# Patient Record
Sex: Male | Born: 1950 | Race: White | Hispanic: No | Marital: Married | Smoking: Never smoker
Health system: Southern US, Community
[De-identification: ages and names within clinical notes are randomized; demographics above are authoritative.]

## PROBLEM LIST (undated history)

## (undated) DIAGNOSIS — R569 Unspecified convulsions: Secondary | ICD-10-CM

## (undated) DIAGNOSIS — I10 Essential (primary) hypertension: Secondary | ICD-10-CM

## (undated) DIAGNOSIS — B009 Herpesviral infection, unspecified: Secondary | ICD-10-CM

## (undated) DIAGNOSIS — H8111 Benign paroxysmal vertigo, right ear: Secondary | ICD-10-CM

## (undated) DIAGNOSIS — D329 Benign neoplasm of meninges, unspecified: Secondary | ICD-10-CM

## (undated) DIAGNOSIS — I480 Paroxysmal atrial fibrillation: Secondary | ICD-10-CM

## (undated) DIAGNOSIS — H269 Unspecified cataract: Secondary | ICD-10-CM

## (undated) DIAGNOSIS — D3001 Benign neoplasm of right kidney: Secondary | ICD-10-CM

## (undated) DIAGNOSIS — N529 Male erectile dysfunction, unspecified: Secondary | ICD-10-CM

## (undated) DIAGNOSIS — F419 Anxiety disorder, unspecified: Secondary | ICD-10-CM

## (undated) DIAGNOSIS — R55 Syncope and collapse: Secondary | ICD-10-CM

## (undated) DIAGNOSIS — K469 Unspecified abdominal hernia without obstruction or gangrene: Secondary | ICD-10-CM

## (undated) DIAGNOSIS — Z87898 Personal history of other specified conditions: Secondary | ICD-10-CM

## (undated) DIAGNOSIS — M069 Rheumatoid arthritis, unspecified: Secondary | ICD-10-CM

## (undated) DIAGNOSIS — I499 Cardiac arrhythmia, unspecified: Secondary | ICD-10-CM

## (undated) HISTORY — PX: FLEXIBLE SIGMOIDOSCOPY: SHX1649

## (undated) HISTORY — DX: Benign neoplasm of right kidney: D30.01

## (undated) HISTORY — PX: REFRACTIVE SURGERY: SHX103

## (undated) HISTORY — DX: Benign paroxysmal vertigo, right ear: H81.11

## (undated) HISTORY — DX: Cardiac arrhythmia, unspecified: I49.9

## (undated) HISTORY — DX: Rheumatoid arthritis, unspecified: M06.9

## (undated) HISTORY — DX: Unspecified abdominal hernia without obstruction or gangrene: K46.9

## (undated) HISTORY — DX: Essential (primary) hypertension: I10

## (undated) HISTORY — DX: Herpesviral infection, unspecified: B00.9

## (undated) HISTORY — DX: Unspecified cataract: H26.9

## (undated) HISTORY — PX: CATARACT EXTRACTION W/ INTRAOCULAR LENS IMPLANT: SHX1309

## (undated) HISTORY — DX: Male erectile dysfunction, unspecified: N52.9

---
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
## 2005-07-31 ENCOUNTER — Ambulatory Visit: Payer: Self-pay | Admitting: Family Medicine

## 2005-08-07 ENCOUNTER — Ambulatory Visit: Payer: Self-pay | Admitting: Family Medicine

## 2006-08-06 ENCOUNTER — Ambulatory Visit: Payer: Self-pay | Admitting: Family Medicine

## 2006-08-13 ENCOUNTER — Ambulatory Visit: Payer: Self-pay | Admitting: Family Medicine

## 2006-08-18 ENCOUNTER — Encounter: Admission: RE | Admit: 2006-08-18 | Discharge: 2006-08-18 | Payer: Self-pay | Admitting: Family Medicine

## 2006-12-15 HISTORY — PX: COLONOSCOPY: SHX174

## 2007-08-24 ENCOUNTER — Ambulatory Visit: Payer: Self-pay | Admitting: Family Medicine

## 2007-08-24 LAB — CONVERTED CEMR LAB
ALT: 26 units/L (ref 0–53)
Albumin: 4 g/dL (ref 3.5–5.2)
Alkaline Phosphatase: 66 units/L (ref 39–117)
BUN: 21 mg/dL (ref 6–23)
Basophils Absolute: 0 10*3/uL (ref 0.0–0.1)
Basophils Relative: 0.1 % (ref 0.0–1.0)
CO2: 29 meq/L (ref 19–32)
Calcium: 9.5 mg/dL (ref 8.4–10.5)
Chloride: 105 meq/L (ref 96–112)
Creatinine, Ser: 1 mg/dL (ref 0.4–1.5)
HDL: 49 mg/dL (ref >39.0)
Hemoglobin: 14.6 g/dL (ref 13.0–17.0)
Ketones, urine, test strip: NEGATIVE
LDL Cholesterol: 115 mg/dL — ABNORMAL HIGH (ref 0–99)
MCHC: 35.1 g/dL (ref 30.0–36.0)
Monocytes Relative: 9.5 % (ref 3.0–11.0)
Nitrite: NEGATIVE
Platelets: 303 10*3/uL (ref 150–400)
Protein, U semiquant: NEGATIVE
RBC: 4.29 M/uL (ref 4.22–5.81)
RDW: 12.5 % (ref 11.5–14.6)
Total Bilirubin: 0.8 mg/dL (ref 0.3–1.2)
Total CHOL/HDL Ratio: 3.6
Total Protein: 6.9 g/dL (ref 6.0–8.3)
Triglycerides: 69 mg/dL (ref 0–149)
Urobilinogen, UA: 0.2
VLDL: 14 mg/dL (ref 0–40)

## 2007-08-26 DIAGNOSIS — J309 Allergic rhinitis, unspecified: Secondary | ICD-10-CM | POA: Insufficient documentation

## 2007-08-26 DIAGNOSIS — I10 Essential (primary) hypertension: Secondary | ICD-10-CM

## 2007-08-31 ENCOUNTER — Ambulatory Visit: Payer: Self-pay | Admitting: Family Medicine

## 2007-08-31 DIAGNOSIS — N529 Male erectile dysfunction, unspecified: Secondary | ICD-10-CM

## 2007-09-17 ENCOUNTER — Ambulatory Visit: Payer: Self-pay | Admitting: Gastroenterology

## 2007-10-06 ENCOUNTER — Ambulatory Visit: Payer: Self-pay | Admitting: Gastroenterology

## 2008-02-24 ENCOUNTER — Ambulatory Visit: Payer: Self-pay | Admitting: Family Medicine

## 2008-07-17 DIAGNOSIS — N410 Acute prostatitis: Secondary | ICD-10-CM | POA: Insufficient documentation

## 2008-07-24 ENCOUNTER — Ambulatory Visit: Payer: Self-pay | Admitting: Family Medicine

## 2008-07-24 LAB — CONVERTED CEMR LAB
Ketones, urine, test strip: NEGATIVE
Nitrite: NEGATIVE
Protein, U semiquant: NEGATIVE

## 2008-08-09 ENCOUNTER — Telehealth: Payer: Self-pay | Admitting: Family Medicine

## 2008-09-25 ENCOUNTER — Ambulatory Visit: Payer: Self-pay | Admitting: Family Medicine

## 2008-09-25 LAB — CONVERTED CEMR LAB
ALT: 29 units/L (ref 0–53)
AST: 23 units/L (ref 0–37)
Alkaline Phosphatase: 66 units/L (ref 39–117)
Basophils Absolute: 0 10*3/uL (ref 0.0–0.1)
Bilirubin Urine: NEGATIVE
Bilirubin, Direct: 0.1 mg/dL (ref 0.0–0.3)
Blood in Urine, dipstick: NEGATIVE
CO2: 29 meq/L (ref 19–32)
Calcium: 9.1 mg/dL (ref 8.4–10.5)
Chloride: 106 meq/L (ref 96–112)
Cholesterol: 168 mg/dL (ref 0–200)
Glucose, Bld: 98 mg/dL (ref 70–99)
Glucose, Urine, Semiquant: NEGATIVE
Hemoglobin: 14.3 g/dL (ref 13.0–17.0)
LDL Cholesterol: 106 mg/dL — ABNORMAL HIGH (ref 0–99)
Lymphocytes Relative: 23.7 % (ref 12.0–46.0)
MCHC: 35 g/dL (ref 30.0–36.0)
Monocytes Relative: 8.9 % (ref 3.0–12.0)
Neutrophils Relative %: 64.9 % (ref 43.0–77.0)
Platelets: 326 10*3/uL (ref 150–400)
Potassium: 4.4 meq/L (ref 3.5–5.1)
Protein, U semiquant: NEGATIVE
RDW: 12.5 % (ref 11.5–14.6)
Sodium: 142 meq/L (ref 135–145)
TSH: 3.73 microintl units/mL (ref 0.35–5.50)
Total Bilirubin: 0.8 mg/dL (ref 0.3–1.2)
Total CHOL/HDL Ratio: 3.9
Total Protein: 7.2 g/dL (ref 6.0–8.3)
Triglycerides: 92 mg/dL (ref 0–149)
Urobilinogen, UA: 0.2
VLDL: 18 mg/dL (ref 0–40)

## 2008-10-09 ENCOUNTER — Ambulatory Visit: Payer: Self-pay | Admitting: Family Medicine

## 2008-10-09 DIAGNOSIS — B369 Superficial mycosis, unspecified: Secondary | ICD-10-CM | POA: Insufficient documentation

## 2009-01-18 ENCOUNTER — Telehealth: Payer: Self-pay | Admitting: Family Medicine

## 2009-10-04 ENCOUNTER — Ambulatory Visit: Payer: Self-pay | Admitting: Family Medicine

## 2009-10-04 LAB — CONVERTED CEMR LAB
Alkaline Phosphatase: 59 units/L (ref 39–117)
Basophils Absolute: 0 10*3/uL (ref 0.0–0.1)
Bilirubin, Direct: 0 mg/dL (ref 0.0–0.3)
Blood in Urine, dipstick: NEGATIVE
CO2: 27 meq/L (ref 19–32)
Calcium: 9.1 mg/dL (ref 8.4–10.5)
Creatinine, Ser: 1 mg/dL (ref 0.4–1.5)
Eosinophils Absolute: 0.1 10*3/uL (ref 0.0–0.7)
GFR calc non Af Amer: 81.27 mL/min (ref >60)
Glucose, Bld: 91 mg/dL (ref 70–99)
HDL: 47.3 mg/dL (ref >39.00)
Ketones, urine, test strip: NEGATIVE
Lymphocytes Relative: 31 % (ref 12.0–46.0)
MCHC: 34 g/dL (ref 30.0–36.0)
Neutrophils Relative %: 55.4 % (ref 43.0–77.0)
Nitrite: NEGATIVE
Protein, U semiquant: NEGATIVE
RDW: 12 % (ref 11.5–14.6)
Specific Gravity, Urine: <1.005
Total Bilirubin: 0.8 mg/dL (ref 0.3–1.2)
Total CHOL/HDL Ratio: 3
Triglycerides: 78 mg/dL (ref 0.0–149.0)
VLDL: 15.6 mg/dL (ref 0.0–40.0)

## 2009-10-18 ENCOUNTER — Ambulatory Visit: Payer: Self-pay | Admitting: Family Medicine

## 2009-10-18 DIAGNOSIS — M25579 Pain in unspecified ankle and joints of unspecified foot: Secondary | ICD-10-CM

## 2009-11-15 ENCOUNTER — Ambulatory Visit: Payer: Self-pay | Admitting: Family Medicine

## 2009-11-15 DIAGNOSIS — M1990 Unspecified osteoarthritis, unspecified site: Secondary | ICD-10-CM

## 2009-11-20 ENCOUNTER — Ambulatory Visit: Payer: Self-pay | Admitting: Family Medicine

## 2009-11-20 LAB — CONVERTED CEMR LAB
Eosinophils Relative: 3.2 % (ref 0.0–5.0)
HCT: 40.6 % (ref 39.0–52.0)
Hemoglobin: 13.7 g/dL (ref 13.0–17.0)
Lymphs Abs: 1.2 10*3/uL (ref 0.7–4.0)
Monocytes Relative: 4.2 % (ref 3.0–12.0)
Neutro Abs: 3.6 10*3/uL (ref 1.4–7.7)
Platelets: 318 10*3/uL (ref 150.0–400.0)
RBC: 4.08 M/uL — ABNORMAL LOW (ref 4.22–5.81)
Rhuematoid fact SerPl-aCnc: 571.9 intl units/mL — ABNORMAL HIGH (ref 0.0–20.0)
WBC: 5.2 10*3/uL (ref 4.5–10.5)

## 2009-11-21 ENCOUNTER — Encounter: Payer: Self-pay | Admitting: Family Medicine

## 2009-11-21 LAB — CONVERTED CEMR LAB: Cyclic Citrullin Peptide Ab: 1389 units — ABNORMAL HIGH (ref <7)

## 2009-11-23 ENCOUNTER — Ambulatory Visit: Payer: Self-pay | Admitting: Family Medicine

## 2009-11-23 DIAGNOSIS — M069 Rheumatoid arthritis, unspecified: Secondary | ICD-10-CM | POA: Insufficient documentation

## 2009-11-26 ENCOUNTER — Ambulatory Visit: Payer: Self-pay | Admitting: Family Medicine

## 2009-12-17 ENCOUNTER — Telehealth: Payer: Self-pay | Admitting: Family Medicine

## 2009-12-18 ENCOUNTER — Telehealth: Payer: Self-pay | Admitting: Family Medicine

## 2009-12-24 ENCOUNTER — Telehealth: Payer: Self-pay | Admitting: Family Medicine

## 2009-12-24 ENCOUNTER — Ambulatory Visit: Payer: Self-pay | Admitting: Family Medicine

## 2009-12-24 LAB — CONVERTED CEMR LAB
ALT: 22 units/L (ref 0–53)
AST: 18 units/L (ref 0–37)
Albumin: 3.7 g/dL (ref 3.5–5.2)
Alkaline Phosphatase: 60 units/L (ref 39–117)
Basophils Relative: 0.1 % (ref 0.0–3.0)
CO2: 30 meq/L (ref 19–32)
Calcium: 9.6 mg/dL (ref 8.4–10.5)
Eosinophils Absolute: 0 10*3/uL (ref 0.0–0.7)
Eosinophils Relative: 0.4 % (ref 0.0–5.0)
Hemoglobin: 14.5 g/dL (ref 13.0–17.0)
Lymphocytes Relative: 13.5 % (ref 12.0–46.0)
MCHC: 32.9 g/dL (ref 30.0–36.0)
Monocytes Relative: 5.9 % (ref 3.0–12.0)
Neutro Abs: 5 10*3/uL (ref 1.4–7.7)
RBC: 4.32 M/uL (ref 4.22–5.81)
Sodium: 140 meq/L (ref 135–145)
Total Protein: 7.5 g/dL (ref 6.0–8.3)

## 2010-02-04 ENCOUNTER — Ambulatory Visit: Payer: Self-pay | Admitting: Family Medicine

## 2010-04-02 ENCOUNTER — Telehealth: Payer: Self-pay | Admitting: Family Medicine

## 2010-04-02 ENCOUNTER — Ambulatory Visit: Payer: Self-pay | Admitting: Family Medicine

## 2010-04-03 LAB — CONVERTED CEMR LAB
ALT: 20 units/L (ref 0–53)
Alkaline Phosphatase: 53 units/L (ref 39–117)
Basophils Relative: 0.8 % (ref 0.0–3.0)
Bilirubin, Direct: 0 mg/dL (ref 0.0–0.3)
Calcium: 9.6 mg/dL (ref 8.4–10.5)
Creatinine, Ser: 1 mg/dL (ref 0.4–1.5)
Eosinophils Absolute: 0 10*3/uL (ref 0.0–0.7)
Eosinophils Relative: 0.1 % (ref 0.0–5.0)
Lymphocytes Relative: 13.9 % (ref 12.0–46.0)
Neutrophils Relative %: 82.1 % — ABNORMAL HIGH (ref 43.0–77.0)
RBC: 4.14 M/uL — ABNORMAL LOW (ref 4.22–5.81)
Total Bilirubin: 0.8 mg/dL (ref 0.3–1.2)
Total Protein: 7.6 g/dL (ref 6.0–8.3)
WBC: 5.9 10*3/uL (ref 4.5–10.5)

## 2010-06-07 ENCOUNTER — Ambulatory Visit: Payer: Self-pay | Admitting: Family Medicine

## 2010-06-28 ENCOUNTER — Ambulatory Visit: Payer: Self-pay | Admitting: Family Medicine

## 2010-06-28 LAB — CONVERTED CEMR LAB
ALT: 18 units/L (ref 0–53)
Albumin: 4.3 g/dL (ref 3.5–5.2)
BUN: 21 mg/dL (ref 6–23)
Basophils Relative: 1.5 % (ref 0.0–3.0)
Chloride: 110 meq/L (ref 96–112)
Cholesterol: 182 mg/dL (ref 0–200)
Creatinine, Ser: 1 mg/dL (ref 0.4–1.5)
Eosinophils Relative: 1.3 % (ref 0.0–5.0)
Glucose, Bld: 95 mg/dL (ref 70–99)
Lymphocytes Relative: 31.1 % (ref 12.0–46.0)
MCV: 102.2 fL — ABNORMAL HIGH (ref 78.0–100.0)
Monocytes Absolute: 0.4 10*3/uL (ref 0.1–1.0)
Monocytes Relative: 8.1 % (ref 3.0–12.0)
Neutrophils Relative %: 58 % (ref 43.0–77.0)
Platelets: 352 10*3/uL (ref 150.0–400.0)
RBC: 4.12 M/uL — ABNORMAL LOW (ref 4.22–5.81)
Total Bilirubin: 0.7 mg/dL (ref 0.3–1.2)
Triglycerides: 51 mg/dL (ref 0.0–149.0)
WBC: 5.1 10*3/uL (ref 4.5–10.5)

## 2010-10-11 ENCOUNTER — Ambulatory Visit: Payer: Self-pay | Admitting: Family Medicine

## 2010-10-11 LAB — CONVERTED CEMR LAB
AST: 22 units/L (ref 0–37)
BUN: 22 mg/dL (ref 6–23)
Basophils Absolute: 0 10*3/uL (ref 0.0–0.1)
Bilirubin Urine: NEGATIVE
Blood in Urine, dipstick: NEGATIVE
Cholesterol: 194 mg/dL (ref 0–200)
Eosinophils Absolute: 0.1 10*3/uL (ref 0.0–0.7)
GFR calc non Af Amer: 90.3 mL/min (ref >60)
Glucose, Bld: 89 mg/dL (ref 70–99)
Glucose, Urine, Semiquant: NEGATIVE
HCT: 42.2 % (ref 39.0–52.0)
HDL: 51.9 mg/dL (ref >39.00)
Ketones, urine, test strip: NEGATIVE
Lymphs Abs: 1.7 10*3/uL (ref 0.7–4.0)
Monocytes Absolute: 0.5 10*3/uL (ref 0.1–1.0)
Monocytes Relative: 10.5 % (ref 3.0–12.0)
PSA: 0.79 ng/mL (ref 0.10–4.00)
Platelets: 348 10*3/uL (ref 150.0–400.0)
Potassium: 4.6 meq/L (ref 3.5–5.1)
Protein, U semiquant: NEGATIVE
RDW: 13.5 % (ref 11.5–14.6)
TSH: 3.1 microintl units/mL (ref 0.35–5.50)
Total Bilirubin: 1.1 mg/dL (ref 0.3–1.2)
VLDL: 9 mg/dL (ref 0.0–40.0)
pH: 5

## 2010-10-22 ENCOUNTER — Ambulatory Visit: Payer: Self-pay | Admitting: Family Medicine

## 2010-12-15 HISTORY — PX: BRAIN MENINGIOMA EXCISION: SHX576

## 2011-01-12 LAB — CONVERTED CEMR LAB: Uric Acid, Serum: 5.5 mg/dL (ref 4.0–7.8)

## 2011-01-15 ENCOUNTER — Telehealth: Payer: Self-pay | Admitting: Family Medicine

## 2011-01-15 NOTE — Telephone Encounter (Signed)
PTS WIFE CALLED TO ADV THAT HER HUSBAND WILL BE COMING IN FOR CPX IN 3 WKS AND PER DR TODD SHE WAS SUPPOSED TO CALL TO MAKE SURE THAT A SHINGLES VACCINE / SHOT WILL BE AVAILABLE FOR HER HUSBAND TO HAVE AT THAT TIME.

## 2011-01-16 NOTE — Progress Notes (Signed)
Summary: RA symptoms  Phone Note Outgoing Call   Caller: Spouse Call For: Roderick Pee MD Summary of Call: Pt's wife called to let Dr. Tawanna Cooler know he took his last Prednisone yesterday, and is having a return of pain and swelling from RA. [phone] [employer] ([place]) Initial call taken by: Lynann Beaver CMA,  December 17, 2009 9:08 AM Summary of Call: [name] tapered off the prednisone by taking 10 mg Monday, Wednesday, Friday, and now having joint pain.  Again.  Advised to take 10 mg daily x 7 days, then 10 mg Monday, Wednesday, Friday, x 2 weeks, then 5 mg Monday, Wednesday, Friday, x 2 weeks, then discuss further tapering Initial call taken by: Roderick Pee MD,  December 17, 2009 9:44 AM

## 2011-01-16 NOTE — Telephone Encounter (Signed)
We have plenty of shingles vaccines available. Please make sure he has checked with his insurance company for coverage.

## 2011-01-16 NOTE — Progress Notes (Signed)
Summary: wrong dosage  Phone Note Call from Patient   Caller: Patient Call For: Roderick Pee MD Summary of Call: Pt had been taking Methotrexate 2.5 mg. x 3 times and just realized he was to take 5 mg.   He took 5 mg. on Sunday for the first time, and wonders if he needs to reschedule his labs and appt until he is taking the correct dosage. Labs 12/24/2009 [phone] Initial call taken by: Lynann Beaver CMA,  December 18, 2009 2:49 PM  Follow-up for Phone Call        n0 Follow-up by: Roderick Pee MD,  December 18, 2009 5:29 PM  Additional Follow-up for Phone Call Additional follow up Details #1::        Pt. notified. Additional Follow-up by: Lynann Beaver CMA,  December 19, 2009 8:19 AM

## 2011-01-16 NOTE — Assessment & Plan Note (Signed)
Summary: 1 month follow up/cjr   Vital Signs:  Patient profile:   60 year old male Weight:      187 pounds Temp:     98.4 degrees F oral BP sitting:   120 / 78  (left arm) Cuff size:   regular  Vitals Entered By: Kern Reap CMA Duncan Dull) (December 24, 2009 9:45 AM)  Reason for Visit 1 month follow up  History of Present Illness: Max [name] is a 60 year old, married male, nonsmoker, who comes in today for evaluation of rheumatoid arthritis.  He was found to have rheumatoid arthritis and started on a combination of prednisone, which he is now down to 10 mg daily.  He's also on methotrexate 2.5 mg two tabs weekly.  He states he is about 90% improved.  He has some morning stiffness in his hands, which tends to go away by 10 a.m..  No side effects from medication.  Allergies: No Known Drug Allergies  Past History:  Past medical, surgical, family and social histories (including risk factors) reviewed for relevance to current acute and chronic problems.  Past Medical History: Reviewed history from 11/23/2009 and no changes required. RLS Allergic rhinitis Hypertension Herpes ED Rheumatoid arthritis  Past Surgical History: Reviewed history from 08/26/2007 and no changes required. Cataract extraction Flexible Sigmoidoscopy  Family History: Reviewed history from 08/26/2007 and no changes required. Family History of Sepsis Family History Depression Family History Hypertension Family History Psychiatric care Family History Weight disorder Family History of Digestive disorder Family History of Respiratory disease  Social History: Reviewed history from 08/31/2007 and no changes required. Occupation: Restaurant manager, fast food glass Married Never Smoked Alcohol use-yes Regular exercise-yes  Review of Systems      See HPI  Physical Exam  General:  Well-developed,well-nourished,in no acute distress; alert,appropriate and cooperative throughout examination Msk:  No deformity or scoliosis  noted of thoracic or lumbar spine.   Pulses:  R and L carotid,radial,femoral,dorsalis pedis and posterior tibial pulses are full and equal bilaterally Extremities:  No clubbing, cyanosis, edema, or deformity noted with normal full range of motion of all joints.     Impression & Recommendations:  Problem # 1:  RHEUMATOID ARTHRITIS (ICD-714.0) Assessment Improved  Orders: Venipuncture ([address]) Prescription Created Electronically [phone]) TLB-BMP (Basic Metabolic Panel-BMET) (80048-METABOL) TLB-CBC Platelet - w/Differential (85025-CBCD) TLB-Hepatic/Liver Function Pnl (80076-HEPATIC)  Complete Medication List: 1)  Verapamil Hcl 120 Mg Tabs (Verapamil hcl) .... Take 1 tablet by mouth every morning 2)  Adult Aspirin Low Strength 81 Mg Tbdp (Aspirin) 3)  Viagra 100 Mg Tabs (Sildenafil citrate) .... Uad 4)  Acyclovir 800 Mg Tabs (Acyclovir) .... Take one tab two times a day 5)  Motrin Ib 200 Mg Tabs (Ibuprofen) .... Take 1 tablet by mouth two times a day as needed foot pain 6)  Calan Sr 240 Mg Cr-tabs (Verapamil hcl) .... Take one tab by mouth once daily 7)  Prednisone 20 Mg Tabs (Prednisone) .... Uad 8)  Trexall 5 Mg Tabs (Methotrexate sodium) .... One weekly  Patient Instructions: 1)  continue your current medications.  I will call you when I get your lab work back 2)  Please schedule a follow-up appointment in 2 months. 3)  remember to take Prilosec 20 mg daily, and a multivitamin with folic acid daily

## 2011-01-16 NOTE — Progress Notes (Signed)
  Phone Note Outgoing Call   Summary of Call: inc. MTX to 10 md wkly.pred. 10 mg once daily.r/t 6 weeks Initial call taken by: Roderick Pee MD,  December 24, 2009 6:38 PM

## 2011-01-16 NOTE — Assessment & Plan Note (Signed)
Summary: 2 MTH ROV // RS   Vital Signs:  Patient profile:   60 year old male Weight:      186 pounds Temp:     98.4 degrees F oral BP sitting:   140 / 78  (left arm) Cuff size:   regular  Vitals Entered By: Kern Reap CMA Duncan Dull) (June 07, 2010 8:52 AM) CC: follow-up visit   CC:  follow-up visit.  History of Present Illness: Max [name] is a 60 year old male, who comes in today for evaluation of pain in his right foot.  He has underlying rheumatoid arthritis.  I referred him to Dorna Leitz because of pain in his right foot.  They diagnosed a leg problem.  However, Max [name] does not, think it's like problem.  He still thinks her something wrong with his foot.  Allergies: No Known Drug Allergies  Physical Exam  General:  Well-developed,well-nourished,in no acute distress; alert,appropriate and cooperative throughout examination Msk:  tenderness at the base of the third MP joint   Impression & Recommendations:  Problem # 1:  PAIN IN JOINT, ANKLE AND FOOT (ICD-719.47) Assessment Unchanged  Complete Medication List: 1)  Verapamil Hcl 120 Mg Tabs (Verapamil hcl) .... Take 1 tablet by mouth every morning 2)  Adult Aspirin Low Strength 81 Mg Tbdp (Aspirin) 3)  Viagra 100 Mg Tabs (Sildenafil citrate) .... Uad 4)  Acyclovir 800 Mg Tabs (Acyclovir) .... Take one tab two times a day 5)  Motrin Ib 200 Mg Tabs (Ibuprofen) .... Take 1 tablet by mouth two times a day as needed foot pain 6)  Calan Sr 240 Mg Cr-tabs (Verapamil hcl) .... Take one tab by mouth once daily 7)  Trexall 5 Mg Tabs (Methotrexate sodium) .... 2 by mouth weekly 8)  Prednisone 1 Mg Tabs (Prednisone) .... Uad  Patient Instructions: 1)  call Dr. Marchelle Gearing.,,,,,,,,, podiatrist........... for a second opinion concerning her foot.  Call the third week in July  to get set up for your routine lab work

## 2011-01-16 NOTE — Progress Notes (Signed)
Summary: motrin  Phone Note Call from Patient   Caller: patsy [phone] Summary of Call: With the prednisone 5mg  day, can he now take Motrin one 200mg  two times a day? Dr. Victorino Dike not at [facility].  Told her [facility].   Initial call taken by: Rudy Jew, RN,  April 02, 2010 1:11 PM  Follow-up for Phone Call        ok.............Marland KitchenDr. Victorino Dike is with [facility]!!!!!!!!!!! Follow-up by: Roderick Pee MD,  April 02, 2010 1:19 PM  Additional Follow-up for Phone Call Additional follow up Details #1::        He has appt with Dr. Victorino Dike next Wed, but he is at [facility] per [facility].   Additional Follow-up by: Rudy Jew, RN,  April 02, 2010 1:25 PM

## 2011-01-16 NOTE — Assessment & Plan Note (Signed)
Summary: 61 month fup//ccm   Vital Signs:  Patient profile:   60 year old male Weight:      186 pounds Temp:     98.3 degrees F oral BP sitting:   130 / 84  (left arm) Cuff size:   regular  Vitals Entered By: Kern Reap CMA Duncan Dull) (April 02, 2010 11:51 AM) CC: follow-up visit   CC:  follow-up visit.  History of Present Illness: Max [name] is a 60 year old, married male, nonsmoker, who comes in today for reevaluation of rheumatoid arthritis.  He is currently on methotrexate 10 mg weekly, and prednisone taper.  Starting tomorrow to be down to 5 mg per day.  We will do now taper by 10 mg every month.  He says overall he feels well.  No stiffness.  No redness.  No soreness in his hands.  He does have some pain in his feet.  He thought it might go away when the arthritis quieted down, but he still having some focal pain in his right foot.  Allergies: No Known Drug Allergies  Past History:  Past medical, surgical, family and social histories (including risk factors) reviewed for relevance to current acute and chronic problems.  Past Medical History: Reviewed history from 11/23/2009 and no changes required. RLS Allergic rhinitis Hypertension Herpes ED Rheumatoid arthritis  Past Surgical History: Reviewed history from 08/26/2007 and no changes required. Cataract extraction Flexible Sigmoidoscopy  Family History: Reviewed history from 08/26/2007 and no changes required. Family History of Sepsis Family History Depression Family History Hypertension Family History Psychiatric care Family History Weight disorder Family History of Digestive disorder Family History of Respiratory disease  Social History: Reviewed history from 08/31/2007 and no changes required. 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 Pulses:  R and L carotid,radial,femoral,dorsalis pedis and posterior tibial pulses are full and equal bilaterally Extremities:  No clubbing, cyanosis, edema, or deformity noted with normal full range of motion of all joints.     Impression & Recommendations:  Problem # 1:  RHEUMATOID ARTHRITIS (ICD-714.0) Assessment Improved  Orders: Venipuncture (82956) Prescription Created Electronically [phone]) TLB-BMP (Basic Metabolic Panel-BMET) (80048-METABOL) TLB-CBC Platelet - w/Differential (85025-CBCD) TLB-Hepatic/Liver Function Pnl (80076-HEPATIC)  Complete Medication List: 1)  Verapamil Hcl 120 Mg Tabs (Verapamil hcl) .... Take 1 tablet by mouth every morning 2)  Adult Aspirin Low Strength 81 Mg Tbdp (Aspirin) 3)  Viagra 100 Mg Tabs (Sildenafil citrate) .... Uad 4)  Acyclovir 800 Mg Tabs (Acyclovir) .... Take one tab two times a day 5)  Motrin Ib 200 Mg Tabs (Ibuprofen) .... Take 1 tablet by mouth two times a day as needed foot pain 6)  Calan Sr 240 Mg Cr-tabs (Verapamil hcl) .... Take one tab by mouth once daily 7)  Prednisone 20 Mg Tabs (Prednisone) .... Uad 8)  Trexall 5 Mg Tabs (Methotrexate sodium) .... 2 by mouth weekly 9)  Prednisone 10 Mg Tabs (Prednisone) .... Uad 10)  Prednisone 1 Mg Tabs (Prednisone) .... Uad  Patient Instructions: 1)  continue the 10 mg of methotrexate weekly.  Take 5 mg of prednisone daily for 3 weeks, 4 mg daily for 3 weeks, 3 mg daily for 3 weeks.  See me in two months for follow-up.Marland Kitchen  I will call you your  Labwork report. 2)  I would also recommend a foot consult with Dr. Toni Arthurs

## 2011-01-16 NOTE — Assessment & Plan Note (Signed)
Summary: 6 WK ROV // RS   Vital Signs:  Patient profile:   60 year old male Weight:      193 pounds Temp:     99.1 degrees F oral BP sitting:   138 / 80  (left arm) Cuff size:   regular  Vitals Entered By: Kern Reap CMA Duncan Dull) (February 04, 2010 8:56 AM)  Reason for Visit follow up office visit  History of Present Illness: Max [name] is a 60 year old, married male, nonsmoker, who comes in today for follow-up of rheumatoid arthritis.  He is currently on methotrexate 10 mg daily and prednisone 10 mg daily.  He is asymptomatic except for little soreness.  No swelling or redness in the ball of his right foot in the morning.  Review of systems negative  Allergies: No Known Drug Allergies  Past History:  Past medical, surgical, family and social histories (including risk factors) reviewed for relevance to current acute and chronic problems.  Past Medical History: Reviewed history from 11/23/2009 and no changes required. RLS Allergic rhinitis Hypertension Herpes ED Rheumatoid arthritis  Past Surgical History: Reviewed history from 08/26/2007 and no changes required. Cataract extraction Flexible Sigmoidoscopy  Family History: Reviewed history from 08/26/2007 and no changes required. Family History of Sepsis Family History Depression Family History Hypertension Family History Psychiatric care Family History Weight disorder Family History of Digestive disorder Family History of Respiratory disease  Social History: Reviewed history from 08/31/2007 and no changes required. 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 Take 1 tablet by mouth every morning 2)  Adult Aspirin Low Strength 81 Mg Tbdp (Aspirin) 3)  Viagra 100 Mg Tabs (Sildenafil citrate) .... Uad 4)  Acyclovir 800 Mg Tabs (Acyclovir) .... Take one tab two times a day 5)  Motrin Ib 200 Mg Tabs (Ibuprofen) .... Take 1 tablet by mouth two times a day as needed foot pain 6)  Calan Sr 240 Mg Cr-tabs (Verapamil hcl) .... Take one tab by mouth once daily 7)  Prednisone 20 Mg Tabs (Prednisone) .... Uad 8)  Trexall 5 Mg Tabs (Methotrexate sodium) .... 2 by mouth weekly 9)  Prednisone 10 Mg Tabs (Prednisone) .... Uad 10)  Prednisone 1 Mg Tabs (Prednisone) .... Uad  Patient Instructions: 1)  continue the methotrexate at 10 mg weekly. 2)  Begin to taper the prednisone by 1 mg every two weeks. 3)  Return in two months for follow-up, sooner if any problems Prescriptions: PREDNISONE 1 MG TABS (PREDNISONE) UAD  #100 x 2   Entered and Authorized by:   Roderick Pee MD   Signed by:   Roderick Pee MD on 02/04/2010   Method used:   Electronically to        [facility] # [address]* (retail)       [address]       [place], [place]  [address]       Ph: [phone]       Fax: [phone]   RxID:   [id number] PREDNISONE 10 MG TABS (PREDNISONE) UAD  #100 x 2   Entered and Authorized by:   Roderick Pee  MD   Signed by:   Roderick Pee MD on 02/04/2010   Method used:   Electronically to        [facility] # [address]* (retail)       [address]       [place], [place]  [address]       Ph: [phone]       Fax: [phone]   RxID:   [id number]

## 2011-01-16 NOTE — Assessment & Plan Note (Signed)
Summary: cpx/cjr   Vital Signs:  Patient profile:   60 year old male Height:      71.5 inches Weight:      189 pounds BMI:     26.09 Temp:     98.1 degrees F oral BP sitting:   138 / 70  (left arm) Cuff size:   regular  Vitals Entered By: Kern Reap CMA Duncan Dull) (October 22, 2010 8:22 AM) CC: cpx   CC:  cpx.  History of Present Illness: Max [name] is a 60 year old, married man, nonsmoker, who comes in today for a new physical examination because of a history of underlying rheumatoid arthritis.  He takes 81 mg a baby aspirin daily and methotrexate 10 mg weekly.  He is been asymptomatic.  He wants to know if he can stop his methotrexate.  He also uses Viagra 100 mg p.r.n.,acyclovir 800 mg b.i.d., verapamil, 120 mg daily for hypertension, 200 mg of Motrin b.i.d. for foot pain......... is currently seeing the podiatrist.  He gets routine eye care, dental care, colonoscopy, and GI, tetanus, 2010, seasonal flu 2010 booster today, information given on shingles  Allergies (verified): No Known Drug Allergies  Past History:  Past medical, surgical, family and social histories (including risk factors) reviewed, and no changes noted (except as noted below).  Past Medical History: Reviewed history from 11/23/2009 and no changes required. RLS Allergic rhinitis Hypertension Herpes ED Rheumatoid arthritis  Past Surgical History: Reviewed history from 08/26/2007 and no changes required. Cataract extraction Flexible Sigmoidoscopy  Family History: Reviewed history from 08/26/2007 and no changes required. Family History of Sepsis Family History Depression Family History Hypertension Family History Psychiatric care Family History Weight disorder Family History of Digestive disorder Family History of Respiratory disease  Social History: Reviewed history from 08/31/2007 and no changes required. 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 no    Any prior history of allergic reactions to egg and/or gelatin? no    Do you have a sensitivity to the preservative Thimersol? no    Do you have a past history of Guillan-Barre Syndrome? no    Do you currently have an acute febrile illness? no    Have you ever had a severe reaction to latex? no    Vaccine information given and explained to patient? yes    Are you currently pregnant? no    Lot Number:[id number]   Exp Date:06/14/2011   Site Given  Right  Deltoid IM   Physical Exam  General:  Well-developed,well-nourished,in no acute distress; alert,appropriate and cooperative throughout examination Head:  Normocephalic and atraumatic without obvious abnormalities. No apparent alopecia or balding. Eyes:  No corneal or conjunctival inflammation noted. EOMI. Perrla. Funduscopic exam benign, without hemorrhages, exudates or papilledema. Vision grossly normal. Ears:  External ear exam shows no significant lesions or deformities.  Otoscopic examination reveals clear canals, tympanic membranes are intact bilaterally without bulging, retraction, inflammation or discharge. Hearing is grossly normal bilaterally. Nose:  External nasal examination shows no deformity or inflammation. Nasal mucosa are pink and moist without lesions or exudates. Mouth:  Oral mucosa and oropharynx without lesions or exudates.  Teeth in good repair. Neck:  No deformities, masses, or tenderness noted. Chest Wall:  No deformities, masses, tenderness or gynecomastia noted. Breasts:  No  masses or gynecomastia noted Lungs:  Normal respiratory effort, chest expands symmetrically. Lungs are clear to auscultation, no crackles or wheezes. Heart:  Normal rate and regular rhythm. S1 and S2 normal without gallop, murmur, click, rub or other extra sounds. Abdomen:  Bowel sounds  positive,abdomen soft and non-tender without masses, organomegaly or hernias noted. Rectal:  No external abnormalities noted. Normal sphincter tone. No rectal masses or tenderness. Genitalia:  Testes bilaterally descended without nodularity, tenderness or masses. No scrotal masses or lesions. No penis lesions or urethral discharge. Prostate:  Prostate gland firm and smooth, no enlargement, nodularity, tenderness, mass, asymmetry or induration. Msk:  No deformity or scoliosis noted of thoracic or lumbar spine.   Pulses:  R and L carotid,radial,femoral,dorsalis pedis and posterior tibial pulses are full and equal bilaterally Extremities:  No clubbing, cyanosis, edema, or deformity noted with normal full range of motion of all joints.   Neurologic:  No cranial nerve deficits noted. Station and gait are normal. Plantar reflexes are down-going bilaterally. DTRs are symmetrical throughout. Sensory, motor and coordinative functions appear intact. Skin:  Intact without suspicious lesions or rashes Cervical Nodes:  No lymphadenopathy noted Axillary Nodes:  No palpable lymphadenopathy Inguinal Nodes:  No significant adenopathy Psych:  Cognition and judgment appear intact. Alert and cooperative with normal attention span and concentration. No apparent delusions, illusions, hallucinations   Impression & Recommendations:  Problem # 1:  RHEUMATOID ARTHRITIS (ICD-714.0) Assessment Improved  Problem # 2:  ERECTILE DYSFUNCTION, ORGANIC (ICD-607.84) Assessment: Improved  His updated medication list for this problem includes:    Viagra 100 Mg Tabs (Sildenafil citrate) ..... Uad  Problem # 3:  HYPERTENSION (ICD-401.9) Assessment: Improved  The following medications were removed from the medication list:    Calan Sr 240 Mg Cr-tabs (Verapamil hcl) .Marland Kitchen... Take one tab by mouth once daily His updated medication list for this problem includes:    Verapamil Hcl 120 Mg Tabs (Verapamil hcl) .Marland Kitchen... Take 1 tablet by  mouth every morning  Orders: EKG w/ Interpretation (93000)  Problem # 4:  and  Complete Medication List: 1)  Verapamil Hcl 120 Mg Tabs (Verapamil hcl) .... Take 1 tablet by mouth every morning 2)  Adult Aspirin Low Strength 81 Mg Tbdp (Aspirin) 3)  Viagra 100 Mg Tabs (Sildenafil citrate) .... Uad 4)  Acyclovir 800 Mg Tabs (Acyclovir) .... Take one tab two times a day 5)  Motrin Ib 200 Mg Tabs (Ibuprofen) .... Take 1 tablet by mouth two times a day as needed foot pain 6)  Trexall 5 Mg Tabs (Methotrexate sodium) .... 2 by mouth weekly  Other Orders: Admin 1st Vaccine ([address]) Flu Vaccine 101yrs + ([address])  Patient Instructions: 1)  let's try to slowly decrease the methotrexate take 3 tablets weekly x 2 weeks, two tablets weekly x 2 weeks, one tablet weekly x 2 weeks, then stop.  However, if in the process of tapering or when u  stop,  u  began to have recurrent symptoms.  Restart the original dose 2)  Please schedule a follow-up appointment in 3 months..714.00 3)  BMP prior to visit, ICD-9: 4)  Hepatic Panel prior to visit, ICD-9: 5)  CBC w/ Diff prior to visit, ICD-9: Prescriptions: TREXALL 5 MG TABS (METHOTREXATE SODIUM) 2 by mouth weekly  #30 x 3   Entered and Authorized by:   Roderick Pee MD   Signed by:   Roderick Pee MD on 10/22/2010   Method used:   Print then Give to Patient  RxID:   [id number] ACYCLOVIR 800 MG TABS (ACYCLOVIR) take one tab two times a day  #60 x 3   Entered and Authorized by:   Roderick Pee MD   Signed by:   Roderick Pee MD on 10/22/2010   Method used:   Print then Give to Patient   RxID:   [id number] VIAGRA 100 MG TABS (SILDENAFIL CITRATE) UAD  #6 x 11   Entered and Authorized by:   Roderick Pee MD   Signed by:   Roderick Pee MD on 10/22/2010   Method used:   Print then Give to Patient   RxID:   [id number] VERAPAMIL HCL 120 MG TABS (VERAPAMIL HCL) Take 1 tablet by mouth every morning  #100 x 3   Entered and Authorized by:    Roderick Pee MD   Signed by:   Roderick Pee MD on 10/22/2010   Method used:   Print then Give to Patient   RxID:   [id number]    Orders Added: 1)  Est. Patient 40-64 years [99396] 2)  EKG w/ Interpretation [93000] 3)  Admin 1st Vaccine [90471] 4)  Flu Vaccine 88yrs + [[address]756]

## 2011-01-28 ENCOUNTER — Other Ambulatory Visit (INDEPENDENT_AMBULATORY_CARE_PROVIDER_SITE_OTHER): Payer: BC Managed Care – PPO | Admitting: Family Medicine

## 2011-01-28 DIAGNOSIS — M069 Rheumatoid arthritis, unspecified: Secondary | ICD-10-CM

## 2011-01-28 LAB — BASIC METABOLIC PANEL
Chloride: 105 mEq/L (ref 96–112)
Potassium: 4.5 mEq/L (ref 3.5–5.1)
Sodium: 140 mEq/L (ref 135–145)

## 2011-01-28 LAB — CBC WITH DIFFERENTIAL/PLATELET
Eosinophils Absolute: 0.1 10*3/uL (ref 0.0–0.7)
HCT: 42.7 % (ref 39.0–52.0)
Lymphs Abs: 1.6 10*3/uL (ref 0.7–4.0)
MCHC: 35 g/dL (ref 30.0–36.0)
MCV: 99 fl (ref 78.0–100.0)
Monocytes Absolute: 0.5 10*3/uL (ref 0.1–1.0)
Neutrophils Relative %: 49.9 % (ref 43.0–77.0)
Platelets: 342 10*3/uL (ref 150.0–400.0)

## 2011-01-28 LAB — HEPATIC FUNCTION PANEL
ALT: 21 U/L (ref 0–53)
AST: 18 U/L (ref 0–37)
Alkaline Phosphatase: 66 U/L (ref 39–117)
Bilirubin, Direct: 0.1 mg/dL (ref 0.0–0.3)
Total Bilirubin: 0.9 mg/dL (ref 0.3–1.2)

## 2011-02-04 ENCOUNTER — Encounter: Payer: Self-pay | Admitting: *Deleted

## 2011-02-04 ENCOUNTER — Encounter: Payer: Self-pay | Admitting: Family Medicine

## 2011-02-05 ENCOUNTER — Ambulatory Visit (INDEPENDENT_AMBULATORY_CARE_PROVIDER_SITE_OTHER): Payer: BC Managed Care – PPO | Admitting: Family Medicine

## 2011-02-05 ENCOUNTER — Encounter: Payer: Self-pay | Admitting: Family Medicine

## 2011-02-05 VITALS — BP 130/80 | Temp 98.2°F | Ht 72.0 in | Wt 196.0 lb

## 2011-02-05 DIAGNOSIS — M069 Rheumatoid arthritis, unspecified: Secondary | ICD-10-CM

## 2011-02-05 NOTE — Progress Notes (Signed)
  Subjective:    Patient ID: Max [name], male    DOB: 1951/03/27, 60 y.o.   MRN: [id number]  HPI Max [name] is a 60 year old, married male, nonsmoker, who comes in today for follow-up of rheumatoid arthritis.  We did his complete physical examination in November and at that time elected to stop his methotrexate.  He's done well since that time, and no recurrence of joint pain or swelling.  Labs are normal.  He continues to have trouble with the neuroma in his foot.  Referred back to Dr. Toni [name] to the orthopedic foot surgeon   Review of Systems    neg  Objective:   Physical Exam    Well-developed well-nourished, male in no acute distress.  Joint exam shows no swelling or erythema    Assessment & Plan:  Rheumatoid arthritis in remission,,,,,,,,, Motrin 600 b.i.d. As a low-dose anti-inflammatory return in November for your annual exam sooner if any problems

## 2011-02-05 NOTE — Patient Instructions (Signed)
Start low-dose Motrin, 600 mg twice daily with food.  Return in November for your annual exam sooner if any problems.  If you want something more done about your foot.  I would recommend Dr. Toni Arthurs, the orthopedic foot surgeon

## 2011-03-26 ENCOUNTER — Telehealth: Payer: Self-pay | Admitting: *Deleted

## 2011-03-26 NOTE — Telephone Encounter (Signed)
Fleet Contras tell him to go back on the dose.  He was on before weekly, and labs are CBC, renal function, liver functions in since he stopped the medication, and then had a flare.  I think he should take it weekly for ever

## 2011-03-26 NOTE — Telephone Encounter (Signed)
Spoke with wife

## 2011-03-26 NOTE — Telephone Encounter (Signed)
patient  Has began taking his methotrexate.  He does have a lab appointment in 2 weeks.  His pain is now subsiding. He would like to know how long should he take the medication, should he taper the medication, or should he take it PRN?  Suggestions please

## 2011-04-08 ENCOUNTER — Other Ambulatory Visit (INDEPENDENT_AMBULATORY_CARE_PROVIDER_SITE_OTHER): Payer: BC Managed Care – PPO

## 2011-04-08 DIAGNOSIS — I10 Essential (primary) hypertension: Secondary | ICD-10-CM

## 2011-04-08 LAB — HEPATIC FUNCTION PANEL
Alkaline Phosphatase: 71 U/L (ref 39–117)
Bilirubin, Direct: 0.2 mg/dL (ref 0.0–0.3)
Total Bilirubin: 0.9 mg/dL (ref 0.3–1.2)
Total Protein: 7.5 g/dL (ref 6.0–8.3)

## 2011-04-08 LAB — CBC WITH DIFFERENTIAL/PLATELET
Basophils Relative: 0.8 % (ref 0.0–3.0)
Eosinophils Absolute: 0.1 10*3/uL (ref 0.0–0.7)
Lymphocytes Relative: 30.3 % (ref 12.0–46.0)
MCHC: 34.5 g/dL (ref 30.0–36.0)
Neutrophils Relative %: 57.8 % (ref 43.0–77.0)
Platelets: 351 10*3/uL (ref 150.0–400.0)
RBC: 4.46 Mil/uL (ref 4.22–5.81)
WBC: 4.5 10*3/uL (ref 4.5–10.5)

## 2011-04-09 NOTE — Progress Notes (Signed)
Left message on machine for patient

## 2011-04-10 ENCOUNTER — Ambulatory Visit (INDEPENDENT_AMBULATORY_CARE_PROVIDER_SITE_OTHER): Payer: BC Managed Care – PPO | Admitting: Family Medicine

## 2011-04-10 ENCOUNTER — Encounter: Payer: Self-pay | Admitting: Family Medicine

## 2011-04-10 VITALS — BP 140/80 | Temp 98.3°F | Wt 192.0 lb

## 2011-04-10 DIAGNOSIS — Z710 Person encountering health services to consult on behalf of another person: Secondary | ICD-10-CM

## 2011-04-10 NOTE — Progress Notes (Signed)
  Subjective:    Patient ID: Max [name], male    DOB: 1951/05/28, 60 y.o.   MRN: [id number]  HPI Max [name] and his wife Max [name], coming today for a consult because Max [name]'s mother was admitted to the hospital for pneumonia on Tuesday, and they did a nasal swab, which was positive for Max [name],,,,,,,,,, although mom and Max [name], are all asymptomatic.  We discussed the issue and decided not to do any more.  Cultures   Review of Systems Negative    Objective:   Physical Exam Well-developed well-nourished male in no acute distress       Assessment & Plan:  Healthy male.  No evidence of Max [name]

## 2011-04-10 NOTE — Patient Instructions (Signed)
If he developed any specific lesions.  Call me

## 2011-05-14 DIAGNOSIS — R569 Unspecified convulsions: Secondary | ICD-10-CM

## 2011-05-14 HISTORY — DX: Unspecified convulsions: R56.9

## 2011-05-15 ENCOUNTER — Emergency Department (HOSPITAL_COMMUNITY): Payer: BC Managed Care – PPO

## 2011-05-15 ENCOUNTER — Inpatient Hospital Stay (HOSPITAL_COMMUNITY): Payer: BC Managed Care – PPO

## 2011-05-15 ENCOUNTER — Inpatient Hospital Stay (HOSPITAL_COMMUNITY)
Admission: EM | Admit: 2011-05-15 | Discharge: 2011-05-18 | DRG: 002 | Disposition: A | Discharge status: Home / Self Care | Payer: BC Managed Care – PPO | Source: Ambulatory Visit | Attending: Neurosurgery | Admitting: Neurosurgery

## 2011-05-15 DIAGNOSIS — M069 Rheumatoid arthritis, unspecified: Secondary | ICD-10-CM | POA: Diagnosis present

## 2011-05-15 DIAGNOSIS — I10 Essential (primary) hypertension: Secondary | ICD-10-CM | POA: Diagnosis present

## 2011-05-15 DIAGNOSIS — D320 Benign neoplasm of cerebral meninges: Principal | ICD-10-CM | POA: Diagnosis present

## 2011-05-15 LAB — COMPREHENSIVE METABOLIC PANEL
ALT: 21 U/L (ref 0–53)
Albumin: 3.5 g/dL (ref 3.5–5.2)
Alkaline Phosphatase: 62 U/L (ref 39–117)
Calcium: 8.6 mg/dL (ref 8.4–10.5)
Glucose, Bld: 112 mg/dL — ABNORMAL HIGH (ref 70–99)
Potassium: 3.3 mEq/L — ABNORMAL LOW (ref 3.5–5.1)
Sodium: 141 mEq/L (ref 135–145)
Total Protein: 6.5 g/dL (ref 6.0–8.3)

## 2011-05-15 LAB — DIFFERENTIAL
Basophils Absolute: 0 10*3/uL (ref 0.0–0.1)
Eosinophils Relative: 1 % (ref 0–5)
Lymphocytes Relative: 31 % (ref 12–46)
Lymphs Abs: 1.9 10*3/uL (ref 0.7–4.0)
Monocytes Absolute: 0.5 10*3/uL (ref 0.1–1.0)
Monocytes Relative: 8 % (ref 3–12)
Neutro Abs: 3.7 10*3/uL (ref 1.7–7.7)

## 2011-05-15 LAB — ABO/RH: ABO/RH(D): A POS

## 2011-05-15 LAB — RAPID URINE DRUG SCREEN, HOSP PERFORMED
Amphetamines: NOT DETECTED
Barbiturates: NOT DETECTED
Benzodiazepines: NOT DETECTED
Tetrahydrocannabinol: NOT DETECTED

## 2011-05-15 LAB — PROTIME-INR
INR: 0.98 (ref 0.00–1.49)
INR: 0.96 (ref 0.00–1.49)

## 2011-05-15 LAB — APTT: aPTT: 24 seconds (ref 24–37)

## 2011-05-15 LAB — URINALYSIS, ROUTINE W REFLEX MICROSCOPIC
Bilirubin Urine: NEGATIVE
Glucose, UA: NEGATIVE mg/dL
Hgb urine dipstick: NEGATIVE
Ketones, ur: NEGATIVE mg/dL
Protein, ur: NEGATIVE mg/dL
Urobilinogen, UA: 0.2 mg/dL (ref 0.0–1.0)

## 2011-05-15 LAB — CBC
HCT: 39 % (ref 39.0–52.0)
Hemoglobin: 13.4 g/dL (ref 13.0–17.0)
MCH: 32.8 pg (ref 26.0–34.0)
MCHC: 34.4 g/dL (ref 30.0–36.0)
MCV: 95.6 fL (ref 78.0–100.0)
RDW: 13.4 % (ref 11.5–15.5)

## 2011-05-15 LAB — MRSA PCR SCREENING: MRSA by PCR: NEGATIVE

## 2011-05-15 LAB — ETHANOL: Alcohol, Ethyl (B): <11 mg/dL — ABNORMAL HIGH (ref 0–10)

## 2011-05-15 MED ORDER — IOHEXOL 300 MG/ML  SOLN
300.0000 mL | Freq: Once | INTRAMUSCULAR | Status: AC | PRN
  Administered 2011-05-15: 160 mL via INTRA_ARTERIAL

## 2011-05-15 MED ORDER — GADOBENATE DIMEGLUMINE 529 MG/ML IV SOLN
18.0000 mL | Freq: Once | INTRAVENOUS | Status: AC
  Administered 2011-05-15: 18 mL via INTRAVENOUS

## 2011-05-16 ENCOUNTER — Other Ambulatory Visit: Payer: Self-pay | Admitting: Neurosurgery

## 2011-05-16 ENCOUNTER — Ambulatory Visit (HOSPITAL_COMMUNITY): Admission: RE | Admit: 2011-05-16 | Payer: Self-pay | Source: Ambulatory Visit | Admitting: Neurosurgery

## 2011-05-16 ENCOUNTER — Inpatient Hospital Stay (HOSPITAL_COMMUNITY): Payer: BC Managed Care – PPO

## 2011-05-16 LAB — CBC
HCT: 33.3 % — ABNORMAL LOW (ref 39.0–52.0)
MCHC: 34.2 g/dL (ref 30.0–36.0)
MCV: 96.2 fL (ref 78.0–100.0)
Platelets: 332 10*3/uL (ref 150–400)
Platelets: 262 10*3/uL (ref 150–400)
RDW: 13.6 % (ref 11.5–15.5)
RDW: 13.7 % (ref 11.5–15.5)
WBC: 12.6 10*3/uL — ABNORMAL HIGH (ref 4.0–10.5)
WBC: 16.4 10*3/uL — ABNORMAL HIGH (ref 4.0–10.5)

## 2011-05-16 LAB — COMPREHENSIVE METABOLIC PANEL
Alkaline Phosphatase: 62 U/L (ref 39–117)
BUN: 18 mg/dL (ref 6–23)
CO2: 27 mEq/L (ref 19–32)
GFR calc non Af Amer: >60 mL/min (ref >60)
Glucose, Bld: 133 mg/dL — ABNORMAL HIGH (ref 70–99)
Potassium: 4.3 mEq/L (ref 3.5–5.1)
Total Bilirubin: 0.3 mg/dL (ref 0.3–1.2)
Total Protein: 6 g/dL (ref 6.0–8.3)

## 2011-05-16 LAB — VALPROIC ACID LEVEL: Valproic Acid Lvl: <10 ug/mL — ABNORMAL LOW (ref 50.0–100.0)

## 2011-05-16 LAB — PROTIME-INR: INR: 0.98 (ref 0.00–1.49)

## 2011-05-16 NOTE — Consult Note (Signed)
  NAME:  Max [name], MOCCIA NO.:  [id number]  MEDICAL RECORD NO.:  [id number]           PATIENT TYPE:  I  LOCATION:  3105                         FACILITY:  [facility]  PHYSICIAN:  Hilda Lias, M.D.   DATE OF BIRTH:  05-Apr-1951  DATE OF CONSULTATION:  05/15/2011 DATE OF DISCHARGE:                                CONSULTATION   Mr. Minasyan is a gentleman who was brought early this morning to the emergency room after his wife found him having seizure.  Because she was asleep, the only thing she remembers is that he took his eyes upwards, and he was shaking all over.  He was brought to the emergency room, had a CT scan and MRI, and we were called for evaluation.  PAST MEDICAL HISTORY:  He has a history of Morton neuroma.  He has a history also of hypertension and rheumatoid arthritis.  FAMILY HISTORY:  Negative.  SURGICAL HISTORY:  Negative.  The patient is a social drinker, does not smoke.  He is not allergic to any medication.  Right now, he is taking methotrexate, verapamil, and folic acid.  PHYSICAL EXAMINATION:  VITAL SIGNS:  The blood pressure is 140/70, pulse of 86, temperature is 97.4. HEAD, EARS, NOSE, AND THROAT:  Normal. NECK:  Normal. LUNGS:  Clear. HEART:  Sound normal. ABDOMEN:  Normal. EXTREMITIES:  Normal pulse. NEUROLOGIC:  Oriented x3.  Cranial nerves are normal.  __strenght normal_______ with some mild weakness in the left foot.  Reflexes are symmetrical. Sensation normal.  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 The tumor looked like most likely a meningioma.  The radiologist concerned about the possibility also this might be a metastasis.  RECOMMENDATIONS:  The patient is being admitted.  I am going to go ahead and do a cerebral angiogram and if this is a meningioma, we are going embolize.  I talked to him and his wife.  I showed the wife the  x-ray. Then after the angiogram, of course he will be going to the intensive care unit, and he is being prepped tomorrow  for surgery.  The procedure would be right frontoparietal craniotomy with resection of the tumor. There is also a possibility that in the tumor bone is  involved.  We are going to do a  cranioplasty. The risks for the surgery of course are bleeding, paralysis stroke__________ infection, recurrence, his inability to remove the tumor.  Both of them agreed, and we are going to proceed with the angiogram today.          ______________________________ Hilda Lias, M.D.     EB/MEDQ  D:  05/15/2011  T:  05/15/2011  Job:  [id number]  Electronically Signed by Hilda Lias M.D. on 05/16/2011 02:03:12 PM

## 2011-05-16 NOTE — Consult Note (Signed)
NAME:  Max [name], HAMMERS NO.:  [id number]  MEDICAL RECORD NO.:  [id number]           PATIENT TYPE:  I  LOCATION:  3105                         FACILITY:  [facility]  PHYSICIAN:  Levie Heritage, MD       DATE OF BIRTH:  [birth year]  DATE OF CONSULTATION:  05/15/2011 DATE OF DISCHARGE:                                CONSULTATION   REASON FOR CONSULTATION:  New-onset seizure.  HISTORY OF PRESENT ILLNESS:  This is a pleasant 60 year old male with past medical history of rheumatoid arthritis, hypertension, newly diagnosed brain mass, and new-onset seizure.  The patient apparently went to sleep last night at baseline at approximately 2:30 in the morning.  The patient's wife was awoken by the patient moaning.  This patient has restless legs syndrome and cramps.  She thought initially that he was moaning secondary to the cramps.  The wife turned on the light and noted that the patient was actually posturing with his left leg flexed, right leg extended straight out, and foaming at the mouth. She states this lasted for approximately 30 seconds to a minute.  She called 911 and ambulance arrived on scene.  At that time, the patient had stopped seizing but was postictal.  The patient did have positive urinary incontinence and tongue biting.  He was brought to [facility] Emergency Department where the patient had returned back to a cognitive state.  He was given 500 mg of Keppra p.o. at that time.  Neurosurgery was consulted and the patient was started on Solu-Medrol.  CT of brain was obtained which showed a large mass in the right vertex along with destruction of overlying calvarium.  Neurosurgery was called at that time and instructions to give the patient steroids were given along with instructions to obtain MRI of the brain.  Neurology was consulted for further recommendations on seizure.  The patient denies any bowel or bladder incontinence.  Denies any symptoms of dysphasia,  dysarthria, denies any abnormal muscle movements or history of seizure.  He denies having any symptoms that are localizing or lateralizing in the past.  Up until this point, the patient had no idea of any intracranial abnormalities.  PAST MEDICAL HISTORY:  As stated above.  MEDICATIONS:  The patient on methotrexate for rheumatoid arthritis, folic acid, and verapamil.  ALLERGIES:  No known allergies.  FAMILY HISTORY:  Brother and grandfather had prostate cancer.  SOCIAL HISTORY:  The patient is married.  Does not smoke, drink, or do illicit drugs.  REVIEW OF SYSTEMS:  Negative with the exception of above.  PHYSICAL EXAMINATION:  VITAL SIGNS:  Blood pressure is 142/75, pulse 82, respirations 19, temperature is 97.4. GENERAL:  The patient is alert and oriented x3.  Carries out two and three-step commands without difficulty. HEENT:  Pupils equal, round, reactive to light and conjugate.  The patient has conjugate gaze.  Extraocular movements are intact.  Visual fields grossly intact.  Face is symmetrical.  Tongue is midline.  Uvula is midline.NEUROLOGIC:  Facial sensation is full.  Shoulder shrug and head turn is full.  Coordination shows smooth finger-to-nose and heel-to-shin.  The patient  shows 5/5 strength throughout.  No tremors, asterixis, or clonus.  Deep tendon reflexes 2+ throughout with downgoing toes.  The patient showed no drift in the upper or lower extremities.  Sensation was full to pinprick, light touch throughout.  The patient showed no localizing or lateralizing symptoms at this time. PULMONARY:  Clear to auscultation. CARDIOVASCULAR:  S1 and S2. NECK:  Negative for bruits.  LABORATORY DATA:  UA was negative.  Sodium 141, potassium 3.3, chloride 104, CO2 of 28, BUN 28, creatinine 1.03, glucose 112.  White blood cell count 6.2, platelets 325, hemoglobin 13.4, hematocrit 39.0.  CT of head shows a large 5.5- x 3.6- x 3.2-cm mass located near the vertex on the right  side with diffuse destruction of the overlying calvarium.  It is mildly heterogeneous but well defined with a mild surrounding vasogenic edema.  There is mass effect on the surrounding gyri, with slight leftward midline shift near the vertex.  Radiologist read this as an aggressive meningioma could have this appearance and metastatic disease or possible sarcoma could also erode through the bone in this manner.  ASSESSMENT:  This is a 60 year old Caucasian male with new-onset seizure in the setting of newly diagnosed right hemisphere brain mass protruding into the skull.  RECOMMENDATIONS: 1. Continue Keppra 500 mg b.i.d. 2. Obtain CT of chest, abdomen, and pelvis with contrast to find     possible primary lesion. 3. Neurosurgery consultation as already requested and proceed accordingly. 4. Continue with steroids for now. 5. No need for EEG at this time.  We will continue to follow the patient while he is in hospital for recommendations on seizures.  Dr. Hoy Morn has seen and evaluated the patient and agrees with the above- mentioned.     Felicie Morn, PA-C  I have examined the patient and agree with above clinical findings. ______________________________ Levie Heritage, MD    DS/MEDQ  D:  05/15/2011  T:  05/15/2011  Job:  [id number]  Electronically Signed by Felicie Morn PA-C on 05/16/2011 08:33:57 AM Electronically Signed by Levie Heritage MD on 05/16/2011 04:05:51 PM

## 2011-05-16 NOTE — H&P (Signed)
NAME:  Max [name], Max [name] NO.:  [id number]  MEDICAL RECORD NO.:  [id number]           PATIENT TYPE:  E  LOCATION:  [facility]                         FACILITY:  [facility]  PHYSICIAN:  Eduard Clos, MDDATE OF BIRTH:  1951/11/24  DATE OF ADMISSION:  05/15/2011 DATE OF DISCHARGE:                             HISTORY & PHYSICAL   PRIMARY CARE PHYSICIAN:  Tinnie Gens A. Tawanna Cooler, MD  CHIEF COMPLAINT:  Seizure.  HISTORY OF PRESENT ILLNESS:  A 60 year old male with history of hypertension, rheumatoid arthritis, was found to have a seizure in the middle of the night while sleeping as witnessed by his wife, which lasted for almost 10 minutes with generalized tonic clonic.  After the seizure, the patient had a postictal phase and the patient was unconscious for a minute, he did bite his tongue, also incontinent of urine, did not lose function of the hands or legs.  Did not have any chest pain, shortness of breath, or any nausea or vomiting.  He had mild headache other than the seizure.  Denies any abdominal pain, dysuria, discharge, or diarrhea.  EMS was called and the patient was brought to the ER.  In the ER, the patient had a CT of head, which is showing a tumor.  At this time, Dr. Jeral Fruit of Neurosurgery was consulted by the ER physician.  Dr. Jeral Fruit has advised hospital admission and he will be consulting.  In addition, Dr. Marjory Lies of Neurology was consulted and advised Keppra for his seizure at this time.  PAST MEDICAL HISTORY: 1. Hypertension. 2. Rheumatoid arthritis.  MEDICATIONS PRIOR TO ADMISSION:  The patient is on fish oil, vitamin E, vitamin C, multivitamins, omeprazole, aspirin 81 mg p.o. daily, verapamil 120 mg p.o. daily, methotrexate 10 mg on Sundays, folic acid p.o. daily.  ALLERGIES:  No known drug allergies.  FAMILY HISTORY:  Significant for hypertension and prostate cancer.  SOCIAL HISTORY:  The patient is married, lives with his wife.  Denies smoking  cigarettes, drinking alcohol, or using illegal drugs.  REVIEW OF SYSTEMS:  As per history of present illness, nothing else significant.  PHYSICAL EXAMINATION:  GENERAL:  The patient examined at bedside, not in acute distress. VITAL SIGNS:  Blood pressure is 140/70, pulse is 80 per minute, temperature 97.4, respirations 18, O2 sat is 100%. HEENT:  Anicteric.  No pallor.  PERRLA positive.  No facial asymmetry. Tongue is midline.  There is some laceration on the left side of this tongue.  No neck rigidity.  No scalp hematoma or laceration. CHEST:  Bilateral air entry present.  No rhonchi, no crepitation. HEART:  S1 and S2 heard. ABDOMEN:  Soft, nontender.  Bowel sounds heard. CNS:  Alert, awake, and oriented to time, place, and person.  Moves upper and lower extremities 5/5. EXTREMITIES:  2+ pedal pulses felt.  No edema.  LABORATORY DATA:  EKG shows normal sinus rhythm with heart rate around 83 beats per minute with nonspecific ST-T changes.  CT of the head without contrast shows large 5.5 x 3.6 x 3.2 cm mass noted near the vertex on the right side with diffuse destruction of the overlying calvarium.  It is mildly heterogeneous, but well defined with mild surrounding vasogenic edema.  There is mass effect on the surrounding gyri with slight left lower midline shift near the vertex and aggressive meningioma could have this appearance of metastatic disease, possibly a sarcoma in this man.  CBC; WBC 6.2, hemoglobin is 13.4, hematocrit 39, platelets 325.  Complete metabolic panel; sodium 141, potassium 3.3, chloride 104, carbon dioxide 28, glucose 112, BUN 28, creatinine 1, total bilirubin is 0.3, alkaline phosphatase is 62, AST 21, ALT 21, troponin 6.1, albumin 3.5, calcium 8.6.  Drug screen is negative. Alcohol less than 11.  UA is negative for nitrites and leukocytes.  ASSESSMENT: 1. Brain tumor. 2. Seizure. 3. History of hypertension. 4. History of rheumatoid  arthritis.  PLAN: 1. At this time, admit the patient to ICU. 2. For his brain tumor and seizure, at this time we have already     consulted neurosurgeon, Dr. Jeral Fruit who has advised MRI of brain     with and without contrast and to place the patient on Decadron 4 mg     p.o. q. 6 hourly, and for a seizure at this time Dr. Marjory Lies of     Neurology recommend Keppra 500 mg p.o. b.i.d. 3. Further recommendation based on test order and clinical course.     Eduard Clos, MD     ANK/MEDQ  D:  05/15/2011  T:  05/15/2011  Job:  [id number]  cc:   Tinnie Gens A. Tawanna Cooler, MD  Electronically Signed by Midge Minium MD on 05/16/2011 07:48:23 AM

## 2011-05-17 LAB — CROSSMATCH
Unit division: 0
Unit division: 0

## 2011-05-18 ENCOUNTER — Inpatient Hospital Stay (HOSPITAL_COMMUNITY): Payer: BC Managed Care – PPO

## 2011-05-21 ENCOUNTER — Ambulatory Visit
Admission: RE | Admit: 2011-05-21 | Discharge: 2011-05-21 | Disposition: A | Discharge status: Home / Self Care | Payer: BC Managed Care – PPO | Source: Ambulatory Visit | Attending: Neurosurgery | Admitting: Neurosurgery

## 2011-05-21 ENCOUNTER — Other Ambulatory Visit: Payer: Self-pay | Admitting: Neurosurgery

## 2011-05-21 DIAGNOSIS — D329 Benign neoplasm of meninges, unspecified: Secondary | ICD-10-CM

## 2011-05-21 DIAGNOSIS — R51 Headache: Secondary | ICD-10-CM

## 2011-05-26 NOTE — Op Note (Signed)
NAME:  Max [name] NO.:  [id number]  MEDICAL RECORD NO.:  [id number]           PATIENT TYPE:  I  LOCATION:  3105                         FACILITY:  [facility]  PHYSICIAN:  Hilda Lias, M.D.   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  PROCEDURES:  Right frontoparietal craniotomy crossing the midline, total gross resection of the meningioma.  Removal of the tumor from the bone flap.  Cranioplasty using bone flap plus Titanium mesh.  SURGEON:  Hilda Lias, MD  ASSISTANT:  Stefani Dama, MD  CLINICAL HISTORY:  Mr. Max [name] is a 60 year old gentleman who was admitted because of several seizure.  MRI showed that he has a large meningioma frontoparietal with impression of the bone flap and compromise of the lateral wall of the sinus.  Surgery was advised.  He, his wife, and brother knew the risk with the surgery including the possibility of unable to resect the tumor, paralysis, stroke, infection, recurrence of tumor, unable to remove the tumor.  They declined second opinion.  The patient knew Dr. Danielle [name] and he was on-call and he was the assistant to the surgeon.  PROCEDURE:  The patient was taken to the OR and after intubation the whole head was shaved.  Three pins were applied.  Then both right and left frontoparietal area was cleaned with DuraPrep.  Drapes were applied.  Scalp flap was done from the superior part of the temporal area into the frontal area crossing the midline about each going posteriorly to the posterior parietal and upper occipital.  Raney clips were applied to the edges.  There was minimal bleeding.  Yesterday, he had cerebral angiogram within both incision, which helped [place] with  the control of the bleeding during surgery.  After we had good hemostasis of the edges of the scalp, we made 2 more holes, one anterior frontal and one posterior parietal.  Then with the craniotome, we did a wide craniotomy about an inch away from the area where we could see compromise of the tumor of the posterior frontal bone.  We crossed the midline after we dissected the sinus away from the bone anterior- posterior.  The bone flap was elevated.  There was an area in which the dura mater was attached to the tumor and to the bone itself.  This was to cut with the cutter.  After we removed the bone flap, we found that indeed the bone flap was compromised.  With our microdissection using the edge between the tumor and the brain tissue.  This microdissection was done mostly at the beginning anterior and then posterior until we were able to dis-attach completely the tumor away from the brain.  The difficult part was then preserve the veins mostly the posterior vein which was close to the tumor and then dissection away from the lateralaspect of the superior sagittal sinus.  This was done.  It took [place] at  least 20 minutes for the procedure to be able to completely dis-attach the tumor from the lateral sinus.  There was no compromise.  During the procedure at the beginning we covered the sinus with Gelfoam to give [place] better hemostasis.  The tumor was removed in toto, was sent to the lab, and came back as a meningioma.  Then after we had completely dissection, the area was irrigated.  Hemostasis was done.  We looked anterior superior and all edges just to be sure that there was any evidence of any bleeding.  Once we achieved good hemostasis with the craniotome, we did wide craniectomy to remove the tumor, which was compromised of the bone.  Essentially, we had the halo.  The space was covered using mesh using different screws.  Then the bone flap.  The dura mater was resected along with the tumor  and this was replaced with artificial dura.  Tack-up suture from the edge of the dura was done to the bone flap and the bone flap was pulled back in place using four different plates.  Then the scalp was closed using Vicryl. Nylon for the anterior part of the wound and staples for the posterior part.  The patient is going back to the intensive care unit.          ______________________________ Hilda Lias, M.D.     EB/MEDQ  D:  05/16/2011  T:  05/17/2011  Job:  [id number]  Electronically Signed by Hilda Lias M.D. on 05/26/2011 10:38:41 AM

## 2011-06-02 ENCOUNTER — Telehealth: Payer: Self-pay | Admitting: *Deleted

## 2011-06-02 NOTE — Telephone Encounter (Signed)
I called Max [name] he has an area on his box that he thinks may be Mrsa  He was in the hospital 3 weeks ago for craniotomy E. Skin a command at age 60 to let me see him for same

## 2011-06-02 NOTE — Telephone Encounter (Signed)
Pt has spot on buttocks, concerned that it may be MRSA.  Would like to be worked in to see Dr.Todd this afternoon.

## 2011-06-03 ENCOUNTER — Encounter: Payer: Self-pay | Admitting: Family Medicine

## 2011-06-03 ENCOUNTER — Ambulatory Visit (INDEPENDENT_AMBULATORY_CARE_PROVIDER_SITE_OTHER): Payer: BC Managed Care – PPO | Admitting: Family Medicine

## 2011-06-03 VITALS — BP 130/80 | Temp 98.0°F | Wt 199.0 lb

## 2011-06-03 DIAGNOSIS — L0231 Cutaneous abscess of buttock: Secondary | ICD-10-CM

## 2011-06-03 DIAGNOSIS — L03317 Cellulitis of buttock: Secondary | ICD-10-CM

## 2011-06-03 MED ORDER — SULFAMETHOXAZOLE-TRIMETHOPRIM 800-160 MG PO TABS: 1.0000 | ORAL_TABLET | Freq: Two times a day (BID) | ORAL | Status: AC

## 2011-06-03 MED ORDER — DOXYCYCLINE HYCLATE 100 MG PO TABS: 100.0000 mg | ORAL_TABLET | Freq: Two times a day (BID) | ORAL | Status: AC

## 2011-06-03 NOTE — Patient Instructions (Signed)
Begin doxycycline and Septra, one of each twice daily.  I will call her the report of the culture.  Clean, and apply antibiotic ointment twice daily

## 2011-06-03 NOTE — Progress Notes (Signed)
  Subjective:    Patient ID: Max [name], male    DOB: June 19, 1951, 60 y.o.   MRN: [id number]  HPI [name] is a 60 year old, married male, nonsmoker, who comes in today with a 4-day history of an irritated, red lesion on his right Buttocks.  He was hospitalized in May, the 31st discharged on June the third with a right frontal parietal meningioma.  He had successful surgery and in no complications.  Four days ago he noticed a red area on his right Buttocks   He comes in today for evaluation.   Review of Systems review of systems negative except for recent hospitalization.  No previous history of skin infection or MRSA   Objective:   Physical Exam    Well-developed well-nourished, male in no acute distress.  Examination of buttock shows a silver dollar size.  It excoriated lesion with no actual pus.  However, there was the scab was removed.  Culture is done to rule out MRSA.  It appears more to be just an excoriated area from prolonged sitting.  This got mildly secondarily infected    Assessment & Plan:  Superficial cellulitis, question MRSA.  Plan culture start on doxy and Septra until cultures pending

## 2011-06-06 LAB — WOUND CULTURE
Gram Stain: NONE SEEN
Gram Stain: NONE SEEN
Gram Stain: NONE SEEN

## 2011-06-19 NOTE — Discharge Summary (Signed)
  NAME:  GRACE, HAGGART NO.:  [id number]  MEDICAL RECORD NO.:  [id number]           PATIENT TYPE:  I  LOCATION:  3031                         FACILITY:  [facility]  PHYSICIAN:  Stefani Dama, M.D.  DATE OF BIRTH:  10/15/1951  DATE OF ADMISSION:  05/15/2011 DATE OF DISCHARGE:  05/18/2011                              DISCHARGE SUMMARY   ADMITTING DIAGNOSIS:  Right frontoparietal meningioma with compromising bone.  DISCHARGE DIAGNOSIS:  Right frontoparietal meningioma with compromising bone.  OPERATIONS AND PROCEDURES:  Right frontoparietal craniotomy across the midline, total gross resection of meningioma removal of tumor from the bone flap cranioplasty using bone flap plus titanium mesh.  BRIEF HISTORY AND HOSPITAL COURSE:  Mr. Keeven is a 60 year old gentleman admitted because of several seizures.  MRI shows a large meningioma frontoparietal.  Surgery was advised.  Risk explained, then elected to proceed.  The patient tolerated the procedure well.  On May 15, 2011, he was placed on a Decadron taper, placed on Keppra for prevention of seizures, and he also had an embolization for extracranial mass by Interventional Radiology.  He tolerated this procedure well. Interventional Radiology and [facility] were consulted during his hospitalization.  He made good progress postoperatively, comfortable on p.o. Vicodin for pain control, seizures controlled on Keppra, tapering off Decadron, ambulating safely, eating well, voiding well, ready for discharge home on May 18, 2011.  DISCHARGE INSTRUCTIONS:  Discharge home.  His CT scan looks good postoperatively.  His incision is clean and dry.  He is to return for follow up with Dr. Jeral Fruit in 1 week for suture removal.  He is placed on Decadron 2 mg one today and one tomorrow p.o. as a Decadron taper and has been on 4 mg p.o. b.i.d. here in the hospital.  He is placed on Keppra 500 mg b.i.d.  He is to take this until he  has rechecked with Dr. Jeral Fruit.  He will need to be on this for a period of time 500 mg b.i.d. p.o.  He will continue on his home medications of aspirin 81 mg, fish oil, folic acid, methotrexate, multivitamin, omeprazole, verapamil, vitamin C, and vitamin E.  Vital signs are stable.  Neurovascularly intact.  Afebrile during his hospitalization.  All questions encouraged, answered and addressed.  The patient is discharged home.  DISCHARGE CONDITION:  Stable, improved.    Aura Fey [facility]ico.   ______________________________ Stefani Dama, M.D.   SCI/MEDQ  D:  05/18/2011  T:  05/19/2011  Job:  [id number]  Electronically Signed by Orlin Hilding P.A. on 05/28/2011 11:56:38 AM Electronically Signed by Barnett Abu M.D. on 06/19/2011 05:16:19 PM

## 2011-07-10 ENCOUNTER — Other Ambulatory Visit: Payer: Self-pay | Admitting: Neurosurgery

## 2011-07-10 DIAGNOSIS — D429 Neoplasm of uncertain behavior of meninges, unspecified: Secondary | ICD-10-CM

## 2011-07-14 ENCOUNTER — Ambulatory Visit
Admission: RE | Admit: 2011-07-14 | Discharge: 2011-07-14 | Disposition: A | Discharge status: Home / Self Care | Payer: BC Managed Care – PPO | Source: Ambulatory Visit | Attending: Neurosurgery | Admitting: Neurosurgery

## 2011-07-14 DIAGNOSIS — D429 Neoplasm of uncertain behavior of meninges, unspecified: Secondary | ICD-10-CM

## 2011-07-14 MED ORDER — IOHEXOL 300 MG/ML  SOLN
75.0000 mL | Freq: Once | INTRAMUSCULAR | Status: AC | PRN
  Administered 2011-07-14: 75 mL via INTRAVENOUS

## 2011-10-09 ENCOUNTER — Telehealth: Payer: Self-pay | Admitting: Family Medicine

## 2011-10-09 MED ORDER — VERAPAMIL HCL 120 MG PO TABS: 120.0000 mg | ORAL_TABLET | Freq: Every day | ORAL | Status: DC

## 2011-10-09 MED ORDER — METHOTREXATE SODIUM 5 MG PO TABS: ORAL_TABLET | ORAL | Status: DC

## 2011-10-09 NOTE — Telephone Encounter (Signed)
Pt is scheduled for a cpx 12/18/11 but will be out of meds before then. Pt is not out now, can pt get enough meds to make it until the cpx? Please contact pt verapamil (CALAN) 120 MG tablet  methotrexate 5 MG tablet

## 2011-12-11 ENCOUNTER — Other Ambulatory Visit (INDEPENDENT_AMBULATORY_CARE_PROVIDER_SITE_OTHER): Payer: BC Managed Care – PPO

## 2011-12-11 DIAGNOSIS — Z0000 Encounter for general adult medical examination without abnormal findings: Secondary | ICD-10-CM

## 2011-12-11 LAB — POCT URINALYSIS DIPSTICK
Bilirubin, UA: NEGATIVE
Blood, UA: NEGATIVE
Glucose, UA: NEGATIVE
Leukocytes, UA: NEGATIVE
Nitrite, UA: NEGATIVE
Urobilinogen, UA: 0.2
pH, UA: 6

## 2011-12-11 LAB — CBC WITH DIFFERENTIAL/PLATELET
Basophils Absolute: 0 10*3/uL (ref 0.0–0.1)
Eosinophils Relative: 2.1 % (ref 0.0–5.0)
Lymphs Abs: 2 10*3/uL (ref 0.7–4.0)
Monocytes Relative: 9.4 % (ref 3.0–12.0)
Neutrophils Relative %: 51.8 % (ref 43.0–77.0)
Platelets: 354 10*3/uL (ref 150.0–400.0)
RDW: 13.8 % (ref 11.5–14.6)
WBC: 5.6 10*3/uL (ref 4.5–10.5)

## 2011-12-11 LAB — LIPID PANEL
Cholesterol: 206 mg/dL — ABNORMAL HIGH (ref 0–200)
Total CHOL/HDL Ratio: 4
Triglycerides: 51 mg/dL (ref 0.0–149.0)
VLDL: 10.2 mg/dL (ref 0.0–40.0)

## 2011-12-11 LAB — BASIC METABOLIC PANEL
BUN: 21 mg/dL (ref 6–23)
Calcium: 9.5 mg/dL (ref 8.4–10.5)
Creatinine, Ser: 0.9 mg/dL (ref 0.4–1.5)
GFR: 91.11 mL/min (ref >60.00)
Glucose, Bld: 91 mg/dL (ref 70–99)

## 2011-12-11 LAB — HEPATIC FUNCTION PANEL
ALT: 38 U/L (ref 0–53)
AST: 31 U/L (ref 0–37)
Bilirubin, Direct: 0.1 mg/dL (ref 0.0–0.3)
Total Bilirubin: 0.7 mg/dL (ref 0.3–1.2)

## 2011-12-17 ENCOUNTER — Encounter: Payer: Self-pay | Admitting: Family Medicine

## 2011-12-17 ENCOUNTER — Encounter: Payer: Self-pay | Admitting: *Deleted

## 2011-12-18 ENCOUNTER — Ambulatory Visit (INDEPENDENT_AMBULATORY_CARE_PROVIDER_SITE_OTHER): Payer: BC Managed Care – PPO | Admitting: Family Medicine

## 2011-12-18 ENCOUNTER — Encounter: Payer: Self-pay | Admitting: Family Medicine

## 2011-12-18 DIAGNOSIS — B009 Herpesviral infection, unspecified: Secondary | ICD-10-CM

## 2011-12-18 DIAGNOSIS — J309 Allergic rhinitis, unspecified: Secondary | ICD-10-CM

## 2011-12-18 DIAGNOSIS — I10 Essential (primary) hypertension: Secondary | ICD-10-CM

## 2011-12-18 DIAGNOSIS — N529 Male erectile dysfunction, unspecified: Secondary | ICD-10-CM

## 2011-12-18 DIAGNOSIS — M069 Rheumatoid arthritis, unspecified: Secondary | ICD-10-CM

## 2011-12-18 DIAGNOSIS — Z0000 Encounter for general adult medical examination without abnormal findings: Secondary | ICD-10-CM

## 2011-12-18 DIAGNOSIS — Z23 Encounter for immunization: Secondary | ICD-10-CM

## 2011-12-18 MED ORDER — ACYCLOVIR 800 MG PO TABS: ORAL_TABLET | ORAL | Status: DC

## 2011-12-18 MED ORDER — VERAPAMIL HCL ER 180 MG PO TBCR: EXTENDED_RELEASE_TABLET | ORAL | Status: DC

## 2011-12-18 MED ORDER — SILDENAFIL CITRATE 100 MG PO TABS: 100.0000 mg | ORAL_TABLET | ORAL | Status: DC | PRN

## 2011-12-18 MED ORDER — METHOTREXATE 2.5 MG PO TABS: ORAL_TABLET | ORAL | Status: DC

## 2011-12-18 NOTE — Progress Notes (Signed)
Subjective:    Patient ID: Max [name], male    DOB: November 26, 1951, 61 y.o.   MRN: [id number]  HPI Max [name] is a 61 year old, married male, nonsmoker, who comes in today for general physical examination because of a history of a brain tumor, recurrent lesion on his right proximal, left and right shoulder pain, palpitations, rheumatoid arthritis,  She has a history of cardiac palpitations and was initially put on verapamil 120 mg daily.  BP 140/90.  He is having episodes of rapid heart rate that occur about 10 seconds 3 to 4 times per week.  Will bump up the verapamil.  He uses Viagra p.r.n. For ED.  He takes 10 mg of methotrexate weekly for RA.  He did have a brain tumor last year was excised.  He was put on postop The character was discontinued.  He is doing well.  No problems.  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  Is also some stiffness and shoulder pain bilaterally for a couple months.  No history of trauma.  Tetanus booster 2010 seasonal flu shot today   Review of Systems  Constitutional: Negative.   HENT: Negative.   Eyes: Negative.   Respiratory: Negative.   Cardiovascular: Negative.   Gastrointestinal: Negative.   Genitourinary: Negative.   Musculoskeletal: Negative.   Skin: Negative.   Neurological: Negative.   Hematological: Negative.   Psychiatric/Behavioral: Negative.        Objective:   Physical Exam  Constitutional: He is oriented to person, place, and time. He appears well-developed and well-nourished.  HENT:  Head: Normocephalic and atraumatic.  Right Ear: External ear normal.  Left Ear: External ear normal.  Nose: Nose normal.  Mouth/Throat: Oropharynx is clear and moist.  Eyes: Conjunctivae and EOM are normal. Pupils are equal, round, and reactive to light.  Neck: Normal range of motion. Neck supple. No JVD present. No tracheal deviation present. No thyromegaly present.  Cardiovascular: Normal rate, regular  rhythm, normal heart sounds and intact distal pulses.  Exam reveals no gallop and no friction rub.   No murmur heard. Pulmonary/Chest: Effort normal and breath sounds normal. No stridor. No respiratory distress. He has no wheezes. He has no rales. He exhibits no tenderness.  Abdominal: Soft. Bowel sounds are normal. He exhibits no distension and no mass. There is no tenderness. There is no rebound and no guarding.  Genitourinary: Rectum normal, prostate normal and penis normal. Guaiac negative stool. No penile tenderness.  Musculoskeletal: Normal range of motion. He exhibits no edema and no tenderness.       He is lacking about 5 to 10 degrees of external rotation both shoulders consistent with some mild arthritis rotator cuff superior to be intact  Lymphadenopathy:    He has no cervical adenopathy.  Neurological: He is alert and oriented to person, place, and time. He has normal reflexes. No cranial nerve deficit. He exhibits normal muscle tone.  Skin: Skin is warm and dry. No rash noted. No erythema. No pallor.       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 His behavior is normal. Judgment and thought content normal.          Assessment & Plan:  Healthy male.  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  History of brain tumor, asymptomatic off anti-seizure medication follow-up by neurosurgery.  Rheumatoid arthritis.  Continue methotrexate 10 mg daily.  Erectile dysfunction continue  Viagra p.r.n.  History of mild hypertension rapid heart rate increase verapamil to 180 daily.  Follow-up p.r.n.

## 2011-12-18 NOTE — Patient Instructions (Signed)
Let's increase the verapamil to 180 mg daily.  Follow-up in two months.  Take the acyclovir one tablet 3 times daily for the lesion.  On your right Botox.  Take Motrin 600 mg twice daily and do range of motion exercises twice daily for her shoulder pain.  If the above does not help or the pain or stiffness gets worse less now.

## 2012-01-01 ENCOUNTER — Other Ambulatory Visit: Payer: Self-pay

## 2012-01-01 DIAGNOSIS — I10 Essential (primary) hypertension: Secondary | ICD-10-CM

## 2012-01-01 MED ORDER — VERAPAMIL HCL ER 180 MG PO TBCR: EXTENDED_RELEASE_TABLET | ORAL | Status: DC

## 2012-02-18 ENCOUNTER — Ambulatory Visit (INDEPENDENT_AMBULATORY_CARE_PROVIDER_SITE_OTHER): Payer: BC Managed Care – PPO | Admitting: Family Medicine

## 2012-02-18 ENCOUNTER — Encounter: Payer: Self-pay | Admitting: Family Medicine

## 2012-02-18 DIAGNOSIS — I10 Essential (primary) hypertension: Secondary | ICD-10-CM

## 2012-02-18 DIAGNOSIS — R002 Palpitations: Secondary | ICD-10-CM

## 2012-02-18 DIAGNOSIS — M069 Rheumatoid arthritis, unspecified: Secondary | ICD-10-CM

## 2012-02-18 LAB — BASIC METABOLIC PANEL
CO2: 28 mEq/L (ref 19–32)
Calcium: 9.5 mg/dL (ref 8.4–10.5)
Chloride: 105 mEq/L (ref 96–112)
Potassium: 4.3 mEq/L (ref 3.5–5.1)
Sodium: 140 mEq/L (ref 135–145)

## 2012-02-18 LAB — HEPATIC FUNCTION PANEL
ALT: 22 U/L (ref 0–53)
AST: 20 U/L (ref 0–37)
Albumin: 4.1 g/dL (ref 3.5–5.2)
Alkaline Phosphatase: 66 U/L (ref 39–117)
Total Protein: 7.1 g/dL (ref 6.0–8.3)

## 2012-02-18 LAB — CBC WITH DIFFERENTIAL/PLATELET
Basophils Absolute: 0.1 10*3/uL (ref 0.0–0.1)
Eosinophils Relative: 0.7 % (ref 0.0–5.0)
Lymphocytes Relative: 30.4 % (ref 12.0–46.0)
Lymphs Abs: 1.5 10*3/uL (ref 0.7–4.0)
Monocytes Relative: 11.7 % (ref 3.0–12.0)
Neutrophils Relative %: 55.9 % (ref 43.0–77.0)
Platelets: 323 10*3/uL (ref 150.0–400.0)
RDW: 13.7 % (ref 11.5–14.6)
WBC: 5 10*3/uL (ref 4.5–10.5)

## 2012-02-18 NOTE — Patient Instructions (Signed)
Continue your current medication  I will call you the report of today's lab work  If your lab work is all well and you feel well then followup CBC liver and renal function in 3 months

## 2012-02-18 NOTE — Progress Notes (Signed)
  Subjective:    Patient ID: Max [name], male    DOB: 08-14-1951, 61 y.o.   MRN: [id number]  HPI Max [name] is a 61 year old married male nonsmoker who comes in today for followup of palpitations  We saw him a couple months ago for physical examination because he has underlying rheumatoid arthritis. He's currently taking 5 mg of methotrexate weekly. Asymptomatic. He tried it dropped to 2.5 mg weekly but this began to have joint pain.  We had started him on verapamil 180 mg daily because of palpitations. Now he says they're very infrequent and when they do occur it only lasts for minutes and then goes away. General and cardiovascular review of systems otherwise negative   Review of Systems General and cardiovascular review of systems otherwise negative    Objective:   Physical Exam Well-developed well-nourished male in no acute distress       Assessment & Plan:  Rheumatoid arthritis stable on 5 mg of methotrexate weekly continue that dose check labs  Palpitations controlled with verapamil 180 mg daily continue that dose

## 2012-03-08 IMAGING — CT CT HEAD W/O CM
2 series · 16 of 30 positions shown, 20 images · non-contrast
Comparison: 05/18/2011 and multiple previous

CLINICAL DATA: Follow-up meningioma resection.  Headaches and
nausea.

CT HEAD WITHOUT CONTRAST
TECHNIQUE: Contiguous axial images were obtained from the base of
the skull through the vertex without contrast.

[Series 2: head w/o · axial · non-contrast · 0.45mm/px · z∈[+24,+154]mm · 13 of 28 slices shown, 17 images]
[im 2/28  brain]
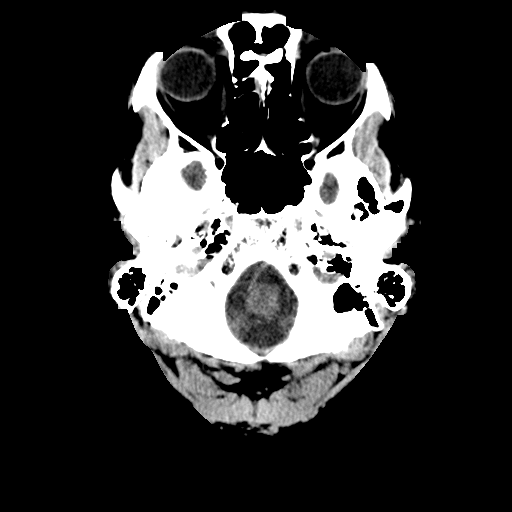
[im 2/28  bone]
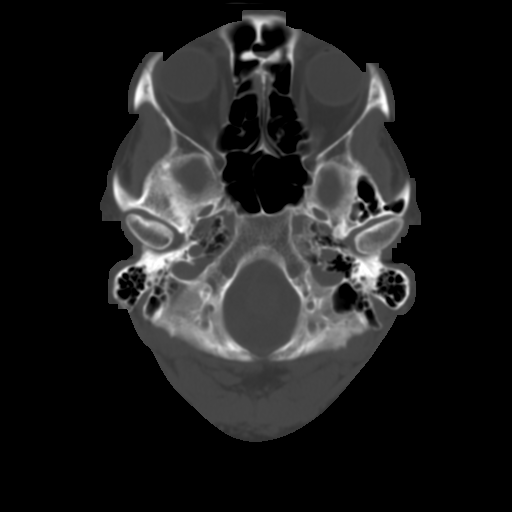
[im 4/28  brain]
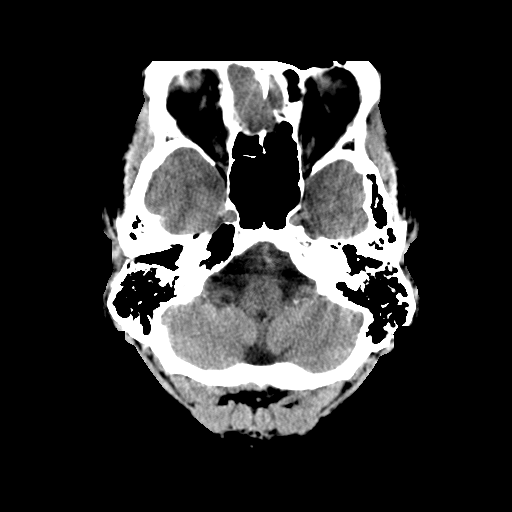
[im 6/28  brain]
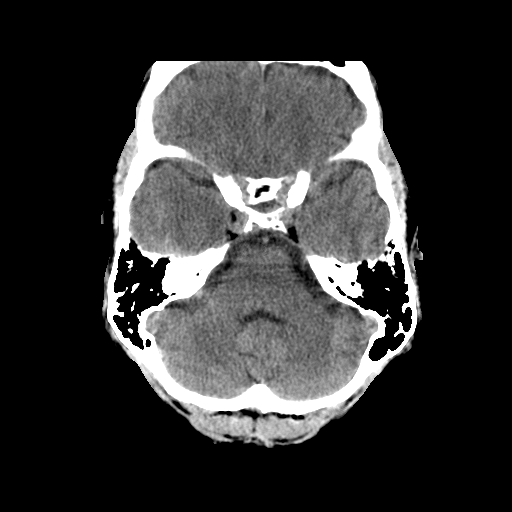
[im 8/28  brain]
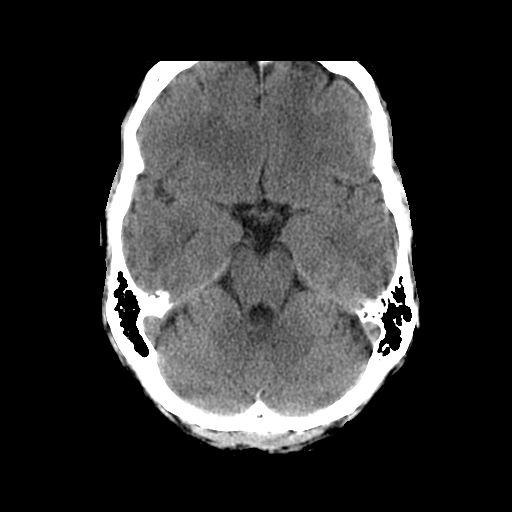
[im 10/28  brain]
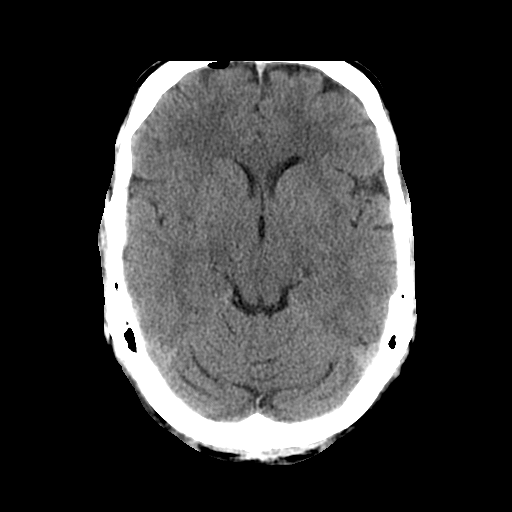
[im 10/28  bone]
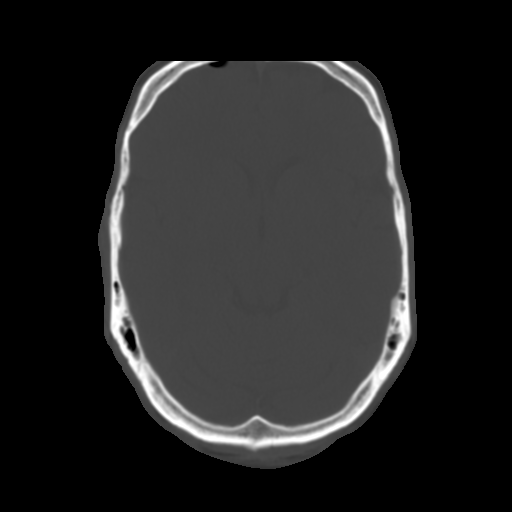
[im 12/28  brain]
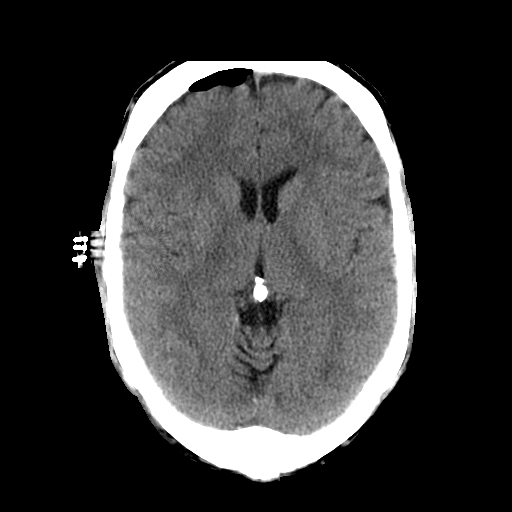
[im 14/28  brain]
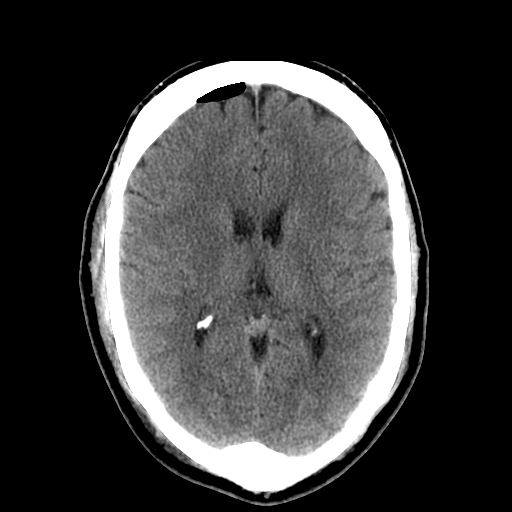
[im 16/28  brain]
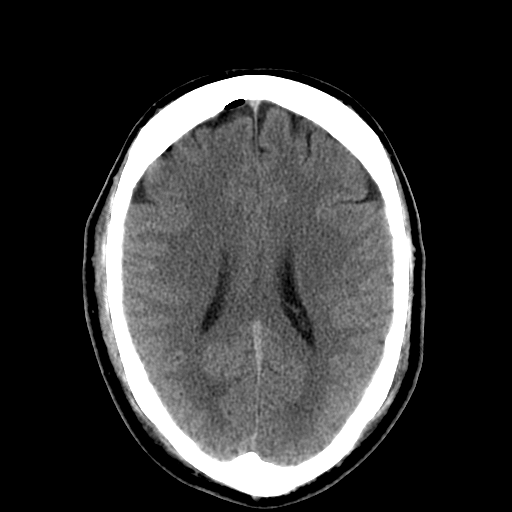
[im 18/28  brain]
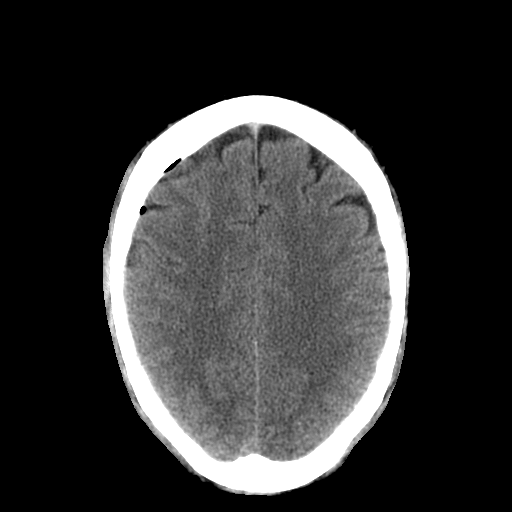
[im 18/28  bone]
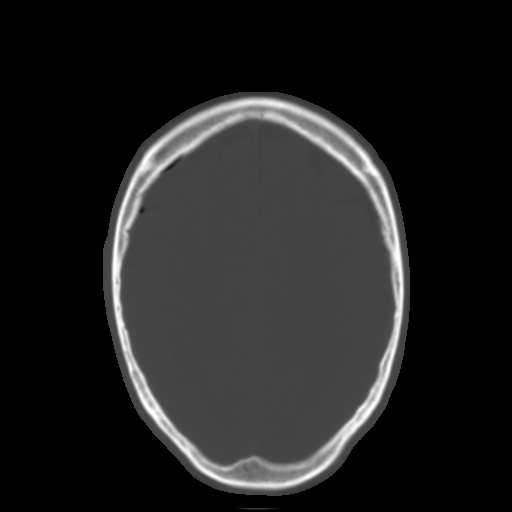
[im 20/28  brain]
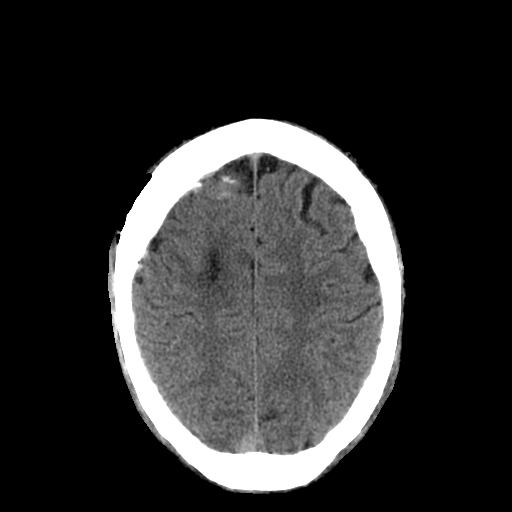
[im 22/28  brain]
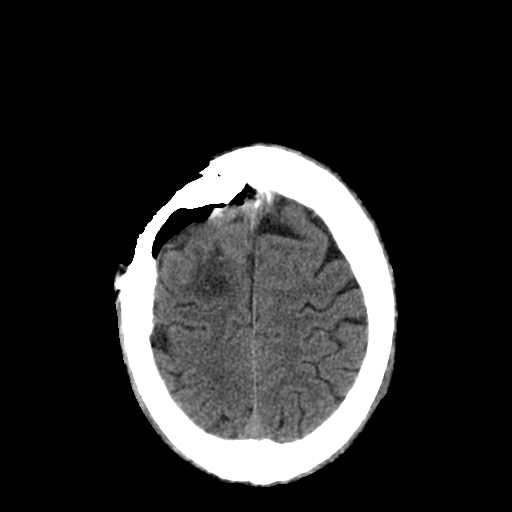
[im 24/28  brain]
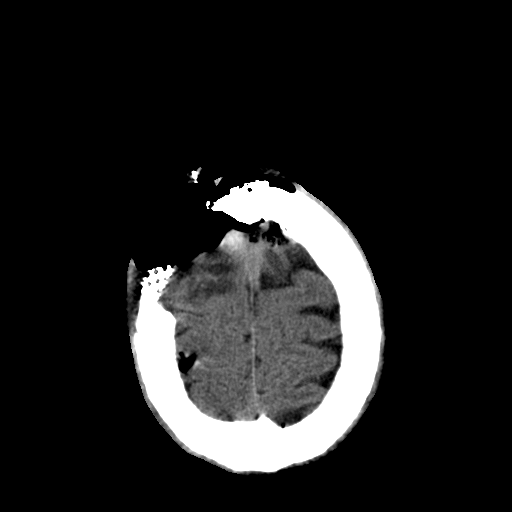
[im 26/28  brain]
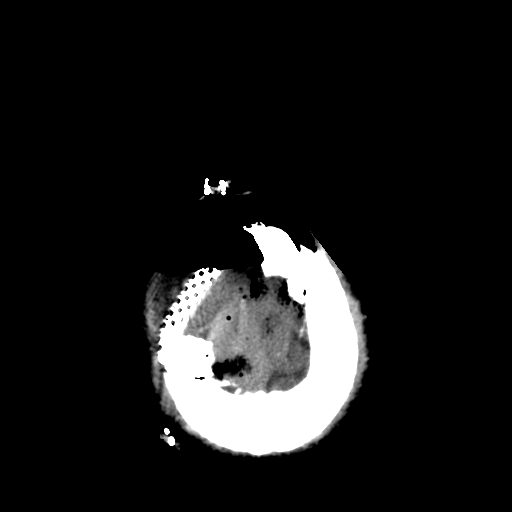
[im 26/28  bone]
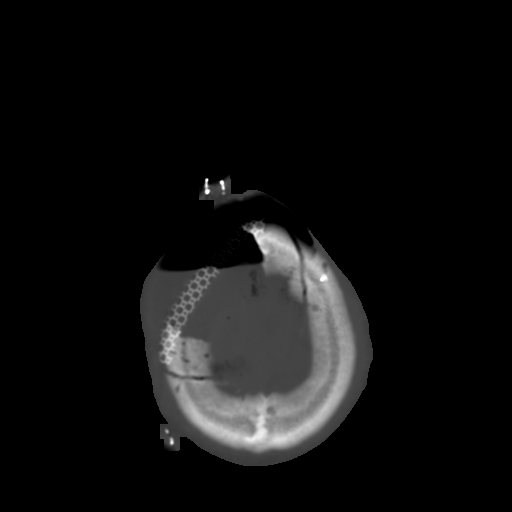

[Series 3: head bone · axial · 0.45mm/px · z∈[+24,+67]mm · 3 of 28 slices shown]
[im 2/28  bone]
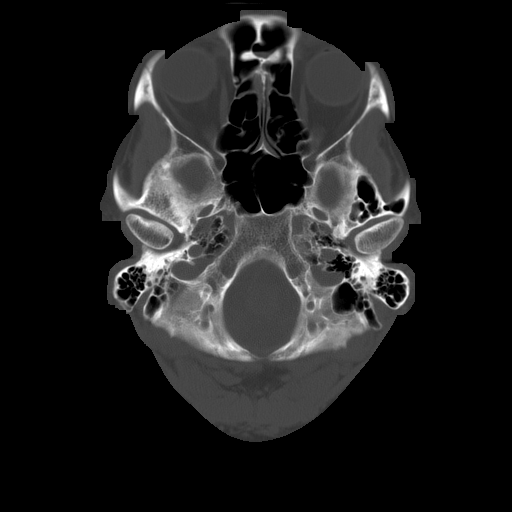
[im 6/28  bone]
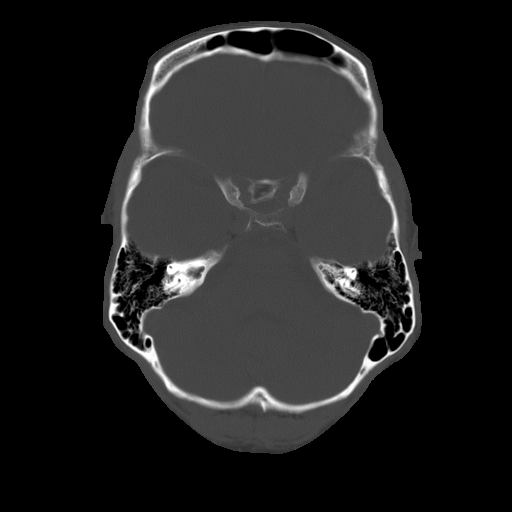
[im 10/28  bone]
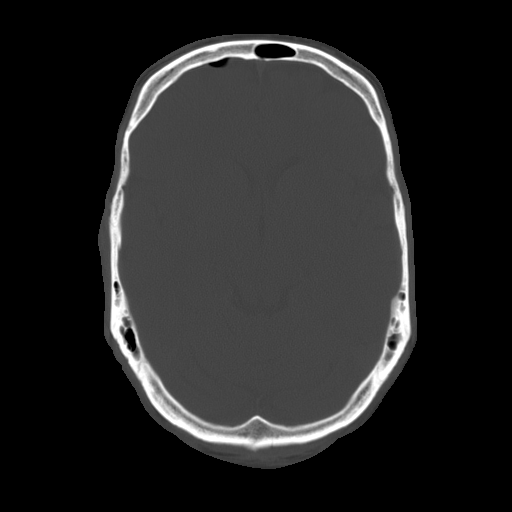

[16 of 30 positions shown; findings below may reference images not displayed]

FINDINGS: There is less subdural fluid and a air on the right in
this patient status post previous craniotomy, craniectomy and
cranioplasty for resection of meningioma.  Small area of low
density in the right to posterior frontal brain is noted with a sub-
centimeter hemorrhage.  There is no sign of ischemic infarction.
No shift.  No hydrocephalus.  Sinuses are clear.
IMPRESSION: Less postoperative subdural fluid and air on the right.  No new or
complicating feature.  Small area of edema and hemorrhage in the
right posterior frontal brain again noted.

## 2012-05-24 ENCOUNTER — Telehealth: Payer: Self-pay | Admitting: Family Medicine

## 2012-05-24 NOTE — Telephone Encounter (Signed)
Per dr. Tawanna Cooler will do the day he comes in - do not send to [facility]

## 2012-05-24 NOTE — Telephone Encounter (Signed)
Please order labs to be done at the [facility] office per MD CBC liver and renal function. Please assist.  Please inform patient when ordered.

## 2012-05-24 NOTE — Telephone Encounter (Signed)
Latoys, please see doctor's response below and call the pt to inform him. Thanks.

## 2012-05-25 ENCOUNTER — Ambulatory Visit: Payer: BC Managed Care – PPO | Admitting: Family Medicine

## 2012-05-26 ENCOUNTER — Ambulatory Visit (INDEPENDENT_AMBULATORY_CARE_PROVIDER_SITE_OTHER): Payer: BC Managed Care – PPO | Admitting: Family Medicine

## 2012-05-26 ENCOUNTER — Encounter: Payer: Self-pay | Admitting: Family Medicine

## 2012-05-26 VITALS — BP 124/80 | Temp 98.3°F | Wt 200.0 lb

## 2012-05-26 DIAGNOSIS — M069 Rheumatoid arthritis, unspecified: Secondary | ICD-10-CM

## 2012-05-26 LAB — CBC WITH DIFFERENTIAL/PLATELET
Basophils Relative: 1.4 % (ref 0.0–3.0)
Eosinophils Relative: 1.3 % (ref 0.0–5.0)
HCT: 45.9 % (ref 39.0–52.0)
Hemoglobin: 15.3 g/dL (ref 13.0–17.0)
Lymphs Abs: 1.5 10*3/uL (ref 0.7–4.0)
MCV: 99.4 fl (ref 78.0–100.0)
Monocytes Absolute: 0.6 10*3/uL (ref 0.1–1.0)
Monocytes Relative: 11.3 % (ref 3.0–12.0)
RBC: 4.62 Mil/uL (ref 4.22–5.81)
WBC: 5.2 10*3/uL (ref 4.5–10.5)

## 2012-05-26 LAB — BASIC METABOLIC PANEL
BUN: 24 mg/dL — ABNORMAL HIGH (ref 6–23)
CO2: 26 mEq/L (ref 19–32)
Calcium: 9.2 mg/dL (ref 8.4–10.5)
GFR: 85.47 mL/min (ref >60.00)
Glucose, Bld: 85 mg/dL (ref 70–99)
Sodium: 137 mEq/L (ref 135–145)

## 2012-05-26 LAB — HEPATIC FUNCTION PANEL
Albumin: 4.2 g/dL (ref 3.5–5.2)
Total Bilirubin: 0.6 mg/dL (ref 0.3–1.2)

## 2012-05-26 NOTE — Patient Instructions (Signed)
Continue your current medications  Followup in December for your annual exam sooner if any problems  Labs one week prior

## 2012-05-26 NOTE — Progress Notes (Signed)
  Subjective:    Patient ID: Max [name], male    DOB: 04-09-1951, 61 y.o.   MRN: [id number]  HPI Max [name] is a 61 year old male married nonsmoker who comes in today for followup of rheumatoid arthritis  He takes 2.5 mg of methotrexate weekly. He was on 4 tablets per week however tapered and he remains asymptomatic  Blood pressure normal on verapamil 124/80   Review of Systems General and neuromuscular review of systems otherwise negative    Objective:   Physical Exam  Well-developed well-nourished male in no acute distress      Assessment & Plan:  Rheumatoid arthritis plan check labs continue methotrexate 2.5 mg weekly return

## 2012-07-14 ENCOUNTER — Emergency Department (HOSPITAL_COMMUNITY): Payer: BC Managed Care – PPO

## 2012-07-14 ENCOUNTER — Observation Stay (HOSPITAL_COMMUNITY)
Admission: EM | Admit: 2012-07-14 | Discharge: 2012-07-16 | DRG: 143 | Disposition: A | Discharge status: Home / Self Care | Payer: BC Managed Care – PPO | Attending: Family Medicine | Admitting: Family Medicine

## 2012-07-14 ENCOUNTER — Encounter (HOSPITAL_COMMUNITY): Payer: Self-pay | Admitting: Emergency Medicine

## 2012-07-14 DIAGNOSIS — Z8619 Personal history of other infectious and parasitic diseases: Secondary | ICD-10-CM

## 2012-07-14 DIAGNOSIS — M1990 Unspecified osteoarthritis, unspecified site: Secondary | ICD-10-CM

## 2012-07-14 DIAGNOSIS — B009 Herpesviral infection, unspecified: Secondary | ICD-10-CM

## 2012-07-14 DIAGNOSIS — M069 Rheumatoid arthritis, unspecified: Secondary | ICD-10-CM | POA: Insufficient documentation

## 2012-07-14 DIAGNOSIS — I10 Essential (primary) hypertension: Secondary | ICD-10-CM | POA: Diagnosis present

## 2012-07-14 DIAGNOSIS — R55 Syncope and collapse: Secondary | ICD-10-CM | POA: Insufficient documentation

## 2012-07-14 DIAGNOSIS — F411 Generalized anxiety disorder: Secondary | ICD-10-CM | POA: Insufficient documentation

## 2012-07-14 DIAGNOSIS — J309 Allergic rhinitis, unspecified: Secondary | ICD-10-CM

## 2012-07-14 DIAGNOSIS — R079 Chest pain, unspecified: Secondary | ICD-10-CM

## 2012-07-14 DIAGNOSIS — R0789 Other chest pain: Principal | ICD-10-CM | POA: Insufficient documentation

## 2012-07-14 DIAGNOSIS — I4891 Unspecified atrial fibrillation: Secondary | ICD-10-CM | POA: Insufficient documentation

## 2012-07-14 DIAGNOSIS — N529 Male erectile dysfunction, unspecified: Secondary | ICD-10-CM

## 2012-07-14 DIAGNOSIS — M25579 Pain in unspecified ankle and joints of unspecified foot: Secondary | ICD-10-CM

## 2012-07-14 DIAGNOSIS — B369 Superficial mycosis, unspecified: Secondary | ICD-10-CM

## 2012-07-14 DIAGNOSIS — R002 Palpitations: Secondary | ICD-10-CM

## 2012-07-14 DIAGNOSIS — N410 Acute prostatitis: Secondary | ICD-10-CM

## 2012-07-14 HISTORY — DX: Personal history of other specified conditions: Z87.898

## 2012-07-14 HISTORY — DX: Syncope and collapse: R55

## 2012-07-14 HISTORY — DX: Paroxysmal atrial fibrillation: I48.0

## 2012-07-14 HISTORY — DX: Unspecified convulsions: R56.9

## 2012-07-14 LAB — CBC WITH DIFFERENTIAL/PLATELET
Basophils Relative: 1 % (ref 0–1)
Eosinophils Absolute: 0 10*3/uL (ref 0.0–0.7)
Eosinophils Relative: 1 % (ref 0–5)
MCH: 32.7 pg (ref 26.0–34.0)
MCHC: 34 g/dL (ref 30.0–36.0)
Neutrophils Relative %: 67 % (ref 43–77)
Platelets: 266 10*3/uL (ref 150–400)
RDW: 12.9 % (ref 11.5–15.5)

## 2012-07-14 LAB — CBC
Hemoglobin: 14.7 g/dL (ref 13.0–17.0)
MCH: 33 pg (ref 26.0–34.0)
Platelets: 335 10*3/uL (ref 150–400)
RBC: 4.45 MIL/uL (ref 4.22–5.81)
WBC: 7.4 10*3/uL (ref 4.0–10.5)

## 2012-07-14 LAB — CARDIAC PANEL(CRET KIN+CKTOT+MB+TROPI)
CK, MB: 2.8 ng/mL (ref 0.3–4.0)
Total CK: 104 U/L (ref 7–232)

## 2012-07-14 LAB — CREATININE, SERUM
Creatinine, Ser: 0.86 mg/dL (ref 0.50–1.35)
GFR calc Af Amer: >90 mL/min (ref >90)
GFR calc non Af Amer: >90 mL/min (ref >90)

## 2012-07-14 LAB — POCT I-STAT TROPONIN I

## 2012-07-14 LAB — BASIC METABOLIC PANEL
BUN: 21 mg/dL (ref 6–23)
CO2: 27 mEq/L (ref 19–32)
Calcium: 8.8 mg/dL (ref 8.4–10.5)
Chloride: 103 mEq/L (ref 96–112)
Creatinine, Ser: 0.99 mg/dL (ref 0.50–1.35)
GFR calc Af Amer: >90 mL/min (ref >90)
GFR calc non Af Amer: 87 mL/min — ABNORMAL LOW (ref >90)
Glucose, Bld: 111 mg/dL — ABNORMAL HIGH (ref 70–99)
Potassium: 3.8 mEq/L (ref 3.5–5.1)
Sodium: 139 mEq/L (ref 135–145)

## 2012-07-14 MED ORDER — ACYCLOVIR 800 MG PO TABS
800.0000 mg | ORAL_TABLET | Freq: Three times a day (TID) | ORAL | Status: DC
  Filled 2012-07-14 (×5): qty 1

## 2012-07-14 MED ORDER — SODIUM CHLORIDE 0.9 % IJ SOLN: 3.0000 mL | Freq: Two times a day (BID) | INTRAMUSCULAR | Status: DC

## 2012-07-14 MED ORDER — VITAMIN C 500 MG PO TABS
500.0000 mg | ORAL_TABLET | Freq: Every day | ORAL | Status: DC
  Administered 2012-07-15 – 2012-07-16 (×2): 500 mg via ORAL
  Filled 2012-07-14 (×3): qty 1

## 2012-07-14 MED ORDER — SODIUM CHLORIDE 0.9 % IJ SOLN
3.0000 mL | Freq: Two times a day (BID) | INTRAMUSCULAR | Status: DC
  Administered 2012-07-14 – 2012-07-16 (×3): 3 mL via INTRAVENOUS

## 2012-07-14 MED ORDER — SODIUM CHLORIDE 0.9 % IJ SOLN: 3.0000 mL | INTRAMUSCULAR | Status: DC | PRN

## 2012-07-14 MED ORDER — ALUM & MAG HYDROXIDE-SIMETH 200-200-20 MG/5ML PO SUSP
30.0000 mL | Freq: Four times a day (QID) | ORAL | Status: DC | PRN
Start: 2012-07-14 — End: 2012-07-16

## 2012-07-14 MED ORDER — ASPIRIN 81 MG PO CHEW
81.0000 mg | CHEWABLE_TABLET | Freq: Every day | ORAL | Status: DC
  Administered 2012-07-15: 81 mg via ORAL
  Filled 2012-07-14: qty 1

## 2012-07-14 MED ORDER — ONDANSETRON HCL 4 MG/2ML IJ SOLN: 4.0000 mg | Freq: Four times a day (QID) | INTRAMUSCULAR | Status: DC | PRN

## 2012-07-14 MED ORDER — FOLIC ACID 400 MCG PO TABS: 1200.0000 ug | ORAL_TABLET | Freq: Every day | ORAL | Status: DC

## 2012-07-14 MED ORDER — ACETAMINOPHEN 325 MG PO TABS
650.0000 mg | ORAL_TABLET | Freq: Four times a day (QID) | ORAL | Status: DC | PRN
  Administered 2012-07-15: 650 mg via ORAL
  Filled 2012-07-14: qty 2

## 2012-07-14 MED ORDER — SODIUM CHLORIDE 0.9 % IV SOLN: 250.0000 mL | INTRAVENOUS | Status: DC | PRN

## 2012-07-14 MED ORDER — PANTOPRAZOLE SODIUM 40 MG PO TBEC
40.0000 mg | DELAYED_RELEASE_TABLET | Freq: Every day | ORAL | Status: DC
  Administered 2012-07-15 – 2012-07-16 (×2): 40 mg via ORAL
  Filled 2012-07-14 (×2): qty 1

## 2012-07-14 MED ORDER — DOCUSATE SODIUM 100 MG PO CAPS
100.0000 mg | ORAL_CAPSULE | Freq: Two times a day (BID) | ORAL | Status: DC
  Administered 2012-07-14 – 2012-07-16 (×2): 100 mg via ORAL
  Filled 2012-07-14 (×5): qty 1

## 2012-07-14 MED ORDER — VERAPAMIL HCL ER 240 MG PO TBCR
240.0000 mg | EXTENDED_RELEASE_TABLET | Freq: Every day | ORAL | Status: DC
  Administered 2012-07-15: 240 mg via ORAL
  Filled 2012-07-14 (×2): qty 1

## 2012-07-14 MED ORDER — ONDANSETRON HCL 4 MG PO TABS: 4.0000 mg | ORAL_TABLET | Freq: Four times a day (QID) | ORAL | Status: DC | PRN

## 2012-07-14 MED ORDER — ZOLPIDEM TARTRATE 5 MG PO TABS: 5.0000 mg | ORAL_TABLET | Freq: Every evening | ORAL | Status: DC | PRN

## 2012-07-14 MED ORDER — ACETAMINOPHEN 650 MG RE SUPP: 650.0000 mg | Freq: Four times a day (QID) | RECTAL | Status: DC | PRN

## 2012-07-14 MED ORDER — ENOXAPARIN SODIUM 40 MG/0.4ML ~~LOC~~ SOLN
40.0000 mg | SUBCUTANEOUS | Status: DC
  Administered 2012-07-14 – 2012-07-15 (×2): 40 mg via SUBCUTANEOUS
  Filled 2012-07-14 (×3): qty 0.4

## 2012-07-14 MED ORDER — ASPIRIN 81 MG PO CHEW
243.0000 mg | CHEWABLE_TABLET | Freq: Once | ORAL | Status: AC
  Administered 2012-07-14: 243 mg via ORAL
  Filled 2012-07-14: qty 3

## 2012-07-14 MED ORDER — FOLIC ACID 1 MG PO TABS
1.0000 mg | ORAL_TABLET | Freq: Every day | ORAL | Status: DC
  Administered 2012-07-15 – 2012-07-16 (×2): 1 mg via ORAL
  Filled 2012-07-14 (×4): qty 1

## 2012-07-14 NOTE — H&P (Signed)
[facility] History and Physical  Max [name] ZOX:[id number] DOB: Jul 06, 1951 DOA: 07/14/2012  Referring physician:  Marlon Pel PCP: Evette Georges, MD   Chief Complaint: Atypical chest pain and syncopal episode.  HPI:  61 year old Caucasian male with history of meningioma, status post resection, rheumatoid arthritis on methotrexate, syncopal episodes related to anxiety in the past, presents to the emergency department today via EMS after having 3 episodes of chest pain over the past week. Today when EMS arrived the patient had another syncopal episode. With regards to his chest pain the patient tells me that the first episode was one week ago, it was 1 minute in duration, and felt like someone was standing in the center of his chest.  The second episode was similar in nature and happened 3 days ago. The third episode was today, again, it lasted 1 minute, was not associated with food, and felt like someone was standing in the center of his chest. Today's chest pain was associated with bilateral hand tingling.  The patient called his PCP Kelle Darting who called an ambulance and sent him to the emergency department. The patient reports that he became anxious and his wife reports he became pale. When EMS started working on him he passed out. Reported vitals from EMS showed hypotension and bradycardia with a pulse rate in the 30s.  With regards to his syncopal episodes The patient has had them on and off throughout his life. They seem to be associated with increased anxiety.  Given his history of meningioma resection a CT head was completed in the ED.  On CT, enhancement adjacent to the right frontal craniotomy site needs to be followed to exclude recurrent tumor, also right frontal lobe vasogenic edema was once again noted and perhaps was slightly more prominent.    Review of Systems:  Pertinent positives include:  Non re-producible chest pain, anxiety Pertinent negatives include:  No  new medications, no recent illness, No SOB, anorexia, changes in bowel habits or difficulty urinating. Non smoker, does not drink alcohol.  All other systems reviewed and found to be negative.   Past Medical History  Diagnosis Date  . RLS (restless legs syndrome)   . Allergic rhinitis   . Hypertension   . Herpes   . ED (erectile dysfunction)   . RA (rheumatoid arthritis)   . Meningioma   . Seizures   . Vasovagal syncope    Past Surgical History  Procedure Date  . Cataract extraction   . Flexible sigmoidoscopy   . Brain surgery removal of meningioma    Social History:  reports that he has never smoked. He does not have any smokeless tobacco history on file. He reports that he does not drink alcohol or use illicit drugs.  Allergies  Allergen Reactions  . Vicodin (Hydrocodone-Acetaminophen) Nausea And Vomiting    Family History  Problem Relation Age of Onset  . Depression Other   . Hypertension Other   . GI problems Other   . Lung disease Other      Prior to Admission medications   Medication Sig Start Date End Date Taking? Authorizing Provider  acyclovir (ZOVIRAX) 800 MG tablet One tablet 3 times daily 12/18/11  Yes Roderick Pee, MD  aspirin 81 MG tablet Take 81 mg by mouth.     Yes Historical Provider, MD  folic acid (FOLVITE) 400 MCG tablet Take 1,200 mcg by mouth daily.   Yes Historical Provider, MD  Glucosamine-Chondroit-Vit C-Mn (GLUCOSAMINE 1500 COMPLEX PO) Take 1 tablet  by mouth daily.   Yes Historical Provider, MD  methotrexate (RHEUMATREX) 2.5 MG tablet 4 tabs Weekly 12/18/11  Yes Roderick Pee, MD  Multiple Vitamins-Minerals (CENTRUM SILVER ULTRA MENS PO) Take 1 tablet by mouth daily.   Yes Historical Provider, MD  omeprazole (PRILOSEC) 20 MG capsule Take 20 mg by mouth daily.   Yes Historical Provider, MD  verapamil (COVERA HS) 240 MG (CO) 24 hr tablet Take 240 mg by mouth daily.   Yes Historical Provider, MD  vitamin C (ASCORBIC ACID) 500 MG tablet Take 500 mg  by mouth daily.   Yes Historical Provider, MD  sildenafil (VIAGRA) 100 MG tablet Take 1 tablet (100 mg total) by mouth as needed for erectile dysfunction. Use as directed 12/18/11   Roderick Pee, MD   Physical Exam: Filed Vitals:   07/14/12 1259 07/14/12 1646  BP: 148/77 141/73  Pulse: 74   Temp: 97.4 F (36.3 C)   TempSrc: Axillary   Resp: 16   Height: 6' (1.829 m)   Weight: 89.359 kg (197 lb)   SpO2: 96% 97%     General:  No Acute Distress, Awake, Non toxic  Eyes: Pupils equal, round and reactive to light  ENT: Oropharynx shows no edema or exudates, nose with out discharge, good dentition  Neck: Supple  Cardiovascular:  Regular rate and rhythm, no murmurs, rubs or gallops, no lower extremity edema, visible jugular venous pulsation  Respiratory: Clear to auscultation, no accessory muscle movment  Abdomen: Soft, non tender, non distended, positive bowel sounds, no obvious masses palpated.  Skin: no rashes, bruises, or lesions  Psychiatric: Alert and Orientated, Cooperative, Well groomed  Neurologic: Cranial Nerves 2-12 are grossly in tact.  Exam non focal.  Labs on Admission:  Basic Metabolic Panel:  Lab 07/14/12 1478  NA 139  K 3.8  CL 103  CO2 27  GLUCOSE 111*  BUN 21  CREATININE 0.99  CALCIUM 8.8  MG --  PHOS --    CBC:  Lab 07/14/12 1311  WBC 4.8  NEUTROABS 3.2  HGB 13.5  HCT 39.7  MCV 96.1  PLT 266    Radiological Exams on Admission: Dg Chest 2 View  07/14/2012  *RADIOLOGY REPORT*  Clinical Data: Chest pain, shortness of breath  CHEST - 2 VIEW  Comparison: 05/16/2011  Findings: Cardiomediastinal silhouette is stable.  No acute infiltrate or pleural effusion.  No pulmonary edema.  Mild degenerative changes thoracic spine.  Stable left basilar scarring.  IMPRESSION: No active disease.  No significant change.  Original Report Authenticated By: Natasha Mead, M.D.    EKG: Was not significantly changed since January.  Assessment/Plan Active  Problems:  HYPERTENSION  Rheumatoid arthritis  Atypical chest pain  Syncope   Atypical Chest Pain:  Will cycle cardiac enzymes, check TSH, and monitor on telemetry.  Doubt ACS.  Already on PPI.  Could this be anxiety related?  Syncope.  Appears to be associated with anxiety.  Likely Vasovagal given history and brief episode of bradycardia noted during event, check  2D echo, Carotid doppler, PT consult, Orthostatic Vitals.  Anxiety Disorder   Hypertension - suboptimally controlled, will follow and adjust meds as required  History of Meningioma. consider re-reviewing head CT with neurosurgery to determine if any additional follow up or treatment needs to take place.  Rheumatoid Arthritis.  On methotrexate.  Need to determine what day of the week this is administered on and order.    Code Status: full code Family Communication: wife at bedside  Disposition Plan: Home tomorrow if work up negative.  Algis Downs, PA-C [facility] Pager: [phone]   The patient was seen and examined and history taken by me, orders, labs, images reviewed, agree with above.   Cleora Fleet, MD, CDE, FAAFP [facility], [place]  Pager [phone]  If 7PM-7AM, please contact night-coverage [email or web address] Password [facility] 07/14/2012, 4:47 PM

## 2012-07-14 NOTE — ED Provider Notes (Signed)
History     CSN: [id number]  Arrival date & time 07/14/12  1247   First MD Initiated Contact with Patient 07/14/12 1304      Chief Complaint  Patient presents with  . Loss of Consciousness    (Consider location/radiation/quality/duration/timing/severity/associated sxs/prior treatment) HPI  Pt presents to the ED for chest pain with associated weakness, diaphoresis and syncopal episode. T he patient had chest pains 8 and 4 days ago and was not seen. Today when it happened today that pain was substernal with associated bilateral hand tingling 8/10. he called his wife. His wife says that he lost his color, was diaphoretic and when the ambulance and EMS came he had LOC. Upon his arrival to the ED the patient no longer had any chest pain or pressures. He says that he has hypertension and RA. He denies hx of heart disease, denies family history of heart disease. Pt VSS and is in NAD at this time.  Past Medical History  Diagnosis Date  . RLS (restless legs syndrome)   . Allergic rhinitis   . Hypertension   . Herpes   . ED (erectile dysfunction)   . RA (rheumatoid arthritis)   . Meningioma   . Seizures     Past Surgical History  Procedure Date  . Cataract extraction   . Flexible sigmoidoscopy   . Brain surgery removal of meningioma     Family History  Problem Relation Age of Onset  . Depression Other   . Hypertension Other   . GI problems Other   . Lung disease Other     History  Substance Use Topics  . Smoking status: Never Smoker   . Smokeless tobacco: Not on file  . Alcohol Use: No      Review of Systems  HEENT: denies blurry vision or change in hearing PULMONARY: Denies difficulty breathing and SOB MUSCULOSKELETAL:  denies being unable to ambulate ABDOMEN AL: denies abdominal pain GU: denies loss of bowel or urinary control NEURO: denies numbness and tingling in extremities SKIN: no new rashes PSYCH: patient denies anxiety or depression. NECK: Pt denies  having neck pain    Allergies  Vicodin  Home Medications   Current Outpatient Rx  Name Route Sig Dispense Refill  . ACYCLOVIR 800 MG PO TABS  One tablet 3 times daily 60 tablet 3  . ASPIRIN 81 MG PO TABS Oral Take 81 mg by mouth.      . FOLIC ACID 400 MCG PO TABS Oral Take 1,200 mcg by mouth daily.    Marland Kitchen GLUCOSAMINE 1500 COMPLEX PO Oral Take 1 tablet by mouth daily.    Marland Kitchen METHOTREXATE 2.5 MG PO TABS  4 tabs Weekly 12 tablet 3  . CENTRUM SILVER ULTRA MENS PO Oral Take 1 tablet by mouth daily.    Marland Kitchen OMEPRAZOLE 20 MG PO CPDR Oral Take 20 mg by mouth daily.    Marland Kitchen VERAPAMIL HCL ER (CO) 240 MG PO TB24 Oral Take 240 mg by mouth daily.    Marland Kitchen VITAMIN C 500 MG PO TABS Oral Take 500 mg by mouth daily.    Marland Kitchen SILDENAFIL CITRATE 100 MG PO TABS Oral Take 1 tablet (100 mg total) by mouth as needed for erectile dysfunction. Use as directed 10 tablet 11    BP 148/77  Pulse 74  Temp 97.4 F (36.3 C) (Axillary)  Resp 16  Ht 6' (1.829 m)  Wt 197 lb (89.359 kg)  BMI 26.72 kg/m2  SpO2 96%  Physical Exam  Nursing note and vitals reviewed. Constitutional: He appears well-developed and well-nourished. No distress.  HENT:  Head: Normocephalic and atraumatic.  Eyes: Pupils are equal, round, and reactive to light.  Neck: Normal range of motion. Neck supple.  Cardiovascular: Normal rate and regular rhythm.   Pulmonary/Chest: Effort normal.  Abdominal: Soft.  Neurological: He is alert.  Skin: Skin is warm and dry.    ED Course  Procedures (including critical care time)  Labs Reviewed  CBC WITH DIFFERENTIAL - Abnormal; Notable for the following:    RBC 4.13 (*)     All other components within normal limits  BASIC METABOLIC PANEL - Abnormal; Notable for the following:    Glucose, Bld 111 (*)     GFR calc non Af Amer 87 (*)     All other components within normal limits  POCT I-STAT TROPONIN I   Dg Chest 2 View  07/14/2012  *RADIOLOGY REPORT*  Clinical Data: Chest pain, shortness of breath  CHEST  - 2 VIEW  Comparison: 05/16/2011  Findings: Cardiomediastinal silhouette is stable.  No acute infiltrate or pleural effusion.  No pulmonary edema.  Mild degenerative changes thoracic spine.  Stable left basilar scarring.  IMPRESSION: No active disease.  No significant change.  Original Report Authenticated By: Natasha Mead, M.D.     1. HSV infection   2. Rheumatoid arthritis   3. Chest pain   4. Syncope       MDM   Pt discussed with Dr. Rubin Payor who recommends I admit this patient for cardiac r/o.   Date: 07/14/2012  Rate: 65  Rhythm: normal sinus rhythm  QRS Axis: normal  Intervals: normal  ST/T Wave abnormalities: normal  Conduction Disutrbances:nonspecific intraventricular conduction delay  Narrative Interpretation:   Old EKG Reviewed: none available   Pt admitted to [facility].        Dorthula Matas, PA 07/14/12 1545

## 2012-07-14 NOTE — ED Notes (Signed)
Per EMS, patient had syncopal episode today after having had several bouts of chest pain for the last week.  Patient descirbes chest tightness, but no real chest pain.   Upon arrival, EMS clms patient heartrate in the 30's, and SBP 80's.  Patient denied SOB or N/V at scene.   Patient claims this is the only time he has "passed out" while he has felt bad.

## 2012-07-15 ENCOUNTER — Encounter (HOSPITAL_COMMUNITY): Payer: Self-pay | Admitting: Physician Assistant

## 2012-07-15 DIAGNOSIS — R002 Palpitations: Secondary | ICD-10-CM

## 2012-07-15 DIAGNOSIS — I4891 Unspecified atrial fibrillation: Secondary | ICD-10-CM | POA: Diagnosis not present

## 2012-07-15 DIAGNOSIS — R55 Syncope and collapse: Secondary | ICD-10-CM

## 2012-07-15 DIAGNOSIS — I480 Paroxysmal atrial fibrillation: Secondary | ICD-10-CM

## 2012-07-15 DIAGNOSIS — R079 Chest pain, unspecified: Secondary | ICD-10-CM

## 2012-07-15 DIAGNOSIS — R0789 Other chest pain: Secondary | ICD-10-CM

## 2012-07-15 DIAGNOSIS — I059 Rheumatic mitral valve disease, unspecified: Secondary | ICD-10-CM

## 2012-07-15 HISTORY — DX: Paroxysmal atrial fibrillation: I48.0

## 2012-07-15 LAB — BASIC METABOLIC PANEL
CO2: 27 mEq/L (ref 19–32)
Chloride: 106 mEq/L (ref 96–112)
GFR calc Af Amer: >90 mL/min (ref >90)
Potassium: 3.6 mEq/L (ref 3.5–5.1)
Sodium: 141 mEq/L (ref 135–145)

## 2012-07-15 LAB — CARDIAC PANEL(CRET KIN+CKTOT+MB+TROPI)
CK, MB: 2.1 ng/mL (ref 0.3–4.0)
Relative Index: INVALID (ref 0.0–2.5)
Relative Index: INVALID (ref 0.0–2.5)
Total CK: 71 U/L (ref 7–232)

## 2012-07-15 LAB — TSH: TSH: 2.27 u[IU]/mL (ref 0.350–4.500)

## 2012-07-15 MED ORDER — ASPIRIN 325 MG PO TABS
325.0000 mg | ORAL_TABLET | Freq: Every day | ORAL | Status: DC
  Administered 2012-07-15 – 2012-07-16 (×2): 325 mg via ORAL
  Filled 2012-07-15 (×2): qty 1

## 2012-07-15 MED ORDER — ASPIRIN 81 MG PO CHEW: 324.0000 mg | CHEWABLE_TABLET | Freq: Every day | ORAL | Status: DC

## 2012-07-15 NOTE — Progress Notes (Signed)
At around 2310 patient had short run of afib and then back to SR. Pt asymptomatic. Pt states he is on Verapamil for palpitations. Will post strip and cont to monitor.

## 2012-07-15 NOTE — Progress Notes (Signed)
CSW attempted to assess patient for advanced directives. Pt currently in 2D echo. CSW will continue to follow to assist with advanced directives and assess for further csw needs.   Catha Gosselin, Theresia Majors  [phone] .07/15/2012 1438pm

## 2012-07-15 NOTE — Consult Note (Signed)
Cardiology Consult Note   [name] ID: Max [name] MRN: [id number], DOB/AGE: Mar 04, 1951   Admit date: 07/14/2012 Date of Consult: 07/15/2012  Primary Physician: Evette Georges, MD Primary Cardiologist: New to cardiology -    Pt. 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   Reason for consult: evaluation/management of chest pain and newly diagnosed atrial fibrillation  HPI:    On interviewing, he notes experiencing palpitations 20 years ago, exacerbated by drinking caffeine. He cut back on caffeine and started on Verapamil, with a noticeable decrease in Max frequency of palpitations. Max [name] followed up with his PCP in 12/2011 and endorsed episodes of rapid heart rates lasting 10 seconds 3-4x/week. Verapamil was increased. These episodes became less frequent, but persisted. He notes that he had just had ice tea for dinner before going to bed last night. Denies lightheadedness, shortness of breath, chest pain or syncope associated with these episodes.  In described his chest pain, he states that 7-8 days ago, he experienced substernal chest "pressure" lasting < 1 minute at rest without radiation. He experienced more episodes, same duration and intensity, leading up to admission not associated with increased stress or anxiety. Yesterday, he had another episode of chest discomfort with associated bilateral arm tingling, and called EMS. He notes one prior episode of similar chest pain while in court associated with significant anxiety. He states he has "passed out" years ago which associated with anxiety. Once EMS arrived, he felt lightheaded and  diaphoretic. No worsening of his chest pain was noted. His wife described him as pale. He lost consciousness for 15 seconds. No involuntary movements, no incontinence or tongue biting. He remembers Max period after his seizure as being "hazy" and states he did not experience Max after his syncopal episode. He relates Max to his prior syncopal episode. No associated palpitations, shortness of breath, nausea/vomiting, orthopnea, PND, LE edema, sick contacts, fevers or chills. No unilateral leg swelling, redness or pain. No active bleeding. No tobacco abuse, no EtOH use in 1 year, no elicit drugs, no family history of CAD. "Normal" cholesterol. BP well-controlled (120/70) on verapamil. He was transported by EMS to [facility] ED.   On ED arrival, EKG revealed no evidence of ischemia. Initial trop-I WNL. CXR revealed no active disease. CBC and BMET WNL. He was admitted by medicine. Orthostatic vital signs were WNL. Three subsequent sets of cardiac biomarkers returned WNL. 2D echo and doppler carotids were performed and is pending. Of note, head CT revealed enhancement adjacent to Max right frontal craniotomy site which needs to be followed to exclude recurrent malignancy. R frontal lobe vasogenic edema was once again noted from a prior study and perhaps was slightly more prominent. Max [name] was noted to have a short paroxysm of atrial fibrillation last night.   Problem List: Past Medical History  Diagnosis Date  . RLS (restless legs syndrome)   . Allergic rhinitis   . Hypertension   . Herpes   . ED (erectile dysfunction)   . RA (rheumatoid arthritis)   . Vasovagal syncope   . Anginal pain   . Seizures 05/14/2011    "first and only"    Past Surgical History  Procedure Date  . Flexible sigmoidoscopy   .  Refractive surgery ~ 2003  . Cataract extraction w/ intraocular lens implant ~ 2001    right  . Eye surgery 1964    right; "hit w/baseball; eye hemorrhaged"  . Brain meningioma excision 2012      Allergies:  Allergies  Allergen Reactions  . Vicodin (Hydrocodone-Acetaminophen) Nausea And Vomiting    Home Medications: Prior to Admission medications   Medication Sig Start Date End Date Taking? Authorizing Provider  acyclovir (ZOVIRAX) 800 MG tablet One tablet 3 times daily 12/18/11  Yes Roderick Pee, MD  aspirin 81 MG tablet Take 81 mg by mouth.     Yes Historical Provider, MD  folic acid (FOLVITE) 400 MCG tablet Take 1,200 mcg by mouth daily.   Yes Historical Provider, MD  Glucosamine-Chondroit-Vit C-Mn (GLUCOSAMINE 1500 COMPLEX PO) Take 1 tablet by mouth daily.   Yes Historical Provider, MD  methotrexate (RHEUMATREX) 2.5 MG tablet 4 tabs Weekly 12/18/11  Yes Roderick Pee, MD  Multiple Vitamins-Minerals (CENTRUM SILVER ULTRA MENS PO) Take 1 tablet by mouth daily.   Yes Historical Provider, MD  omeprazole (PRILOSEC) 20 MG capsule Take 20 mg by mouth daily.   Yes Historical Provider, MD  verapamil (COVERA HS) 240 MG (CO) 24 hr tablet Take 240 mg by mouth daily.   Yes Historical Provider, MD  vitamin C (ASCORBIC ACID) 500 MG tablet Take 500 mg by mouth daily.   Yes Historical Provider, MD  sildenafil (VIAGRA) 100 MG tablet Take 1 tablet (100 mg total) by mouth as needed for erectile dysfunction. Use as directed 12/18/11   Roderick Pee, MD    Inpatient Medications:     . acyclovir  800 mg Oral TID  . aspirin  81 mg Oral Daily  . docusate sodium  100 mg Oral BID  . enoxaparin (LOVENOX) injection  40 mg Subcutaneous Q24H  . folic acid  1 mg Oral Daily  . pantoprazole  40 mg Oral Q1200  . sodium chloride  3 mL Intravenous Q12H  . sodium chloride  3 mL Intravenous Q12H  . verapamil  240 mg Oral Daily  . vitamin C  500 mg Oral Daily  . DISCONTD: folic acid  1,200 mcg Oral Daily   Prescriptions prior to admission  Medication Sig Dispense Refill  . acyclovir (ZOVIRAX) 800 MG tablet One tablet 3 times daily  60 tablet  3  . aspirin 81 MG tablet Take 81 mg by mouth.        .  folic acid (FOLVITE) 400 MCG tablet Take 1,200 mcg by mouth daily.      . Glucosamine-Chondroit-Vit C-Mn (GLUCOSAMINE 1500 COMPLEX PO) Take 1 tablet by mouth daily.      . methotrexate (RHEUMATREX) 2.5 MG tablet 4 tabs Weekly  12 tablet  3  . Multiple Vitamins-Minerals (CENTRUM SILVER ULTRA MENS PO) Take 1 tablet by mouth daily.      Marland Kitchen omeprazole (PRILOSEC) 20 MG capsule Take 20 mg by mouth daily.      . verapamil (COVERA HS) 240 MG (CO) 24 hr tablet Take 240 mg by mouth daily.      . vitamin C (ASCORBIC ACID) 500 MG tablet Take 500 mg by mouth daily.      . sildenafil (VIAGRA) 100 MG tablet Take 1 tablet (100 mg total) by mouth as needed for erectile dysfunction. Use as directed  10 tablet  11    Family History  Problem Relation Age of Onset  . Depression Other   . Hypertension Other   .  GI problems Other   . Lung disease Other      History   Social History  . Marital Status: Married    Spouse Name: N/A    Number of Children: N/A  . Years of Education: N/A   Occupational History  .      [employer]    Social History Main Topics  . Smoking status: Never Smoker   . Smokeless tobacco: Never Used  . Alcohol Use: No     07/14/2012 "haven't had any alcohol in 1 yr"  . Drug Use: No  . Sexually Active: Yes   Other Topics Concern  . Not on file   Social History Narrative   Regular Exercise     Review of Systems: General: positive for pallor and diaphoresis, negative for chills, fever, night sweats or weight changes.  Cardiovascular: positive for chest pain, palpitations, lightheadedness, negative for dyspnea on exertion, edema, orthopnea, paroxysmal nocturnal dyspnea or shortness of breath Dermatological: negative for rash Respiratory: negative for cough or wheezing Urologic: negative for hematuria Abdominal: negative for nausea, vomiting, diarrhea, bright red blood per rectum, melena, or hematemesis Neurologic: positive for syncope, negative for visual changes or  dizziness All other systems reviewed and are otherwise negative except as noted above.  Physical Exam: Blood pressure 123/70, pulse 67, temperature 97.2 F (36.2 C), temperature source Oral, resp. rate 20, height 6' (1.829 m), weight 89.359 kg (197 lb), SpO2 95.00%.   General: Well developed, well nourished, in no acute distress. Head: Normocephalic, atraumatic, sclera non-icteric, no xanthomas, nares are without discharge.  Neck: Negative for carotid bruits. JVD not elevated. Lungs: Clear bilaterally to auscultation without wheezes, rales, or rhonchi. Breathing is unlabored. Heart: RRR with S1 S2. No murmurs, rubs, or gallops appreciated. Abdomen:  Soft, non-tender, non-distended with normoactive bowel sounds. No hepatomegaly. No rebound/guarding. No obvious abdominal masses. Msk:  Strength and tone appears normal for age. Extremities:  No clubbing, cyanosis or edema.  Distal pedal pulses are 2+ and equal bilaterally. Neuro:  Alert and oriented X 3. Moves all extremities spontaneously. Psych:  Responds to questions appropriately with a normal affect.  Labs: Recent Labs  Basename 07/14/12 1838 07/14/12 1311   WBC 7.4 4.8   HGB 14.7 13.5   HCT 42.9 39.7   MCV 96.4 96.1   PLT 335 266    Lab 07/15/12 0200 07/14/12 1838 07/14/12 1311  NA 141 -- 139  K 3.6 -- 3.8  CL 106 -- 103  CO2 27 -- 27  BUN 16 -- 21  CREATININE 0.84 0.86 0.99  CALCIUM 8.7 -- 8.8  PROT -- -- --  BILITOT -- -- --  ALKPHOS -- -- --  ALT -- -- --  AST -- -- --  AMYLASE -- -- --  LIPASE -- -- --  GLUCOSE 94 -- 111*   Recent Labs  Basename 07/15/12 0845 07/15/12 0143 07/14/12 1717   CKTOTAL 71 73 104   CKMB 2.1 2.2 2.8   CKMBINDEX -- -- --   TROPONINI <0.30 <0.30 <0.30    Basename 07/14/12 1838  TSH 2.270  T4TOTAL --  T3FREE --  THYROIDAB --    Radiology/Studies: Dg Chest 2 View  07/14/2012  *RADIOLOGY REPORT*  Clinical Data: Chest pain, shortness of breath  CHEST - 2 VIEW  Comparison:  05/16/2011  Findings: Cardiomediastinal silhouette is stable.  No acute infiltrate or pleural effusion.  No pulmonary edema.  Mild degenerative changes thoracic spine.  Stable left basilar scarring.  IMPRESSION:  No active disease.  No significant change.  Original Report Authenticated By: Natasha Mead, M.D.   Noncontrast head CT  IMPRESSION:  Post right frontal craniectomy for resection of a meningioma.  Resolution of previously noted postoperative pneumocephalus and  hemorrhage within Max right frontal lobe.  Enhancement adjacent to Max right frontal craniectomy site may  represent postoperative changes and granulation tissue. Right  frontal lobe vasogenic edema once again noted and perhaps slightly  more prominent. Attention to these regions on follow-up.  EKG: NSR, 65 bpm, no ST-T wave changes  ASSESSMENT AND PLAN:   1. Chest pain- typical and atypical features. Not particularly related to exertion. Described as substernal pressure with bilateral arm tingling. No associated symptoms. Episodes have lasted < 1 minute and been similar in intensity over Max past week. They have become more frequent. He did develop chest pain associated with anxiety initially, but did not feel particularly anxious with subsequent episodes. No progressive decrease in activity level. Max was similar to prior chest pain he experienced while considerably anxious at court. 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 No cardiac history. No tobacco, alcohol or elicit drug use. Cholesterol well-controlled off any antihyperlipidemics. No family history of CAD. Only cardiac risk factor is HTN which has been well-controlled per his recordings and PCP notes. On admission, EKG is non-ischemic and cardiac biomarkers are WNL. Favor exercise [employer] If normal, no further ischemic work-up. NPO midnight.   2. Paroxysmal atrial fibrillation- evidenced on telemetry overnight. Max [name] notes a 20 year history of  very intermittent, infrequent palpitations. Initially, these were exacerbated by large amounts of caffeine intake, but have been moderated by restriction in caffeine consumption and rate-control with Verapamil. I suspect these palpitations are very short paroxysms of atrial fibrillation. Max [name] drank iced tea with dinner last night before going to sleep and shortly after, atrial fibrillation episode was recorded. His CHADSVASc score is 1 (HTN) supporting Max use of full-dose ASA. Would continue Verapamil as he seems to be tolerating Max well and its benefits are two-fold with BP and rate-control benefits.   3. Syncope- likely vasovagal-mediated on description. His history of meningioma, but no endorsement of seizure like activity or neurological deficits. Orthostatics WNL. Carotid dopplers pending. 2D echo pending, will review.   4. HTN- very well-controlled on Verapamil. Will continue.   5. Anxiety- if Myoview returns without evidence of ischemia, would place anxiety higher on Max differential for chest pain etiology. Anxiety has already demonstrated to induce syncope in Max [name], and may also manifest as chest pain.   6. History of meningioma- CT scan in 07/14/2011 did show some questionable evidence of recurrence. Advise to carefully monitor and follow as recommended.   7. Rheumatoid arthritis- continue methotrexate.    Signed, R. Hurman Horn, PA-C 07/15/2012, 3:27 PM  [name] seen with Mr. Shaune Spittle, PA-C.  His chest pain is atypical but [name] is very anxious about it and we will pursue it with treadmill myoview in am. 2D echo results are pending. His atrial fib is adequately controlled by avoidance of caffeine and by his verapamil. He tolerates Max verapamil well. His physical exam today is normal. Agree with assessment and plan as above.

## 2012-07-15 NOTE — Plan of Care (Signed)
Problem: Phase II Progression Outcomes Goal: Stress Test if indicated Outcome: Progressing myoview scheduled for 07/16/2012

## 2012-07-15 NOTE — Progress Notes (Signed)
PT Cancellation and Discharge Note  Treatment cancelled today due to pt observed up Independent in room.  No skilled PT needs at this time.    Max [name], [place] [phone] 07/15/2012, 2:03 PM

## 2012-07-15 NOTE — Progress Notes (Signed)
[facility] Progress Note  07/15/2012  Subjective: Pt says he feels much better today.  Still awaiting some [place] tests to be done.   Had a short run of Afib.  History of palpatations.    Objective:  Vital signs in last 24 hours: Filed Vitals:   07/15/12 0631 07/15/12 1610 07/15/12 0636 07/15/12 1033  BP: 118/75 107/71 109/76 120/72  Pulse: 63 74 80   Temp:      TempSrc:      Resp:      Height:      Weight:      SpO2:       Weight change:  No intake or output data in the 24 hours ending 07/15/12 1221 No results found for this basename: HGBA1C   Lab Results  Component Value Date   LDLCALC 133* 10/11/2010   CREATININE 0.84 07/15/2012    Review of Systems As above, otherwise all reviewed and reported negative  Physical Exam General - awake, no distress, cooperative HEENT - NCAT, MMM Lungs - BBS, CTA CV - normal s1, s2 sounds Abd - soft, nondistended, no masses, nontender Ext - no C/C/E  Lab Results: Results for orders placed during the hospital encounter of 07/14/12 (from the past 24 hour(s))  CBC WITH DIFFERENTIAL     Status: Abnormal   Collection Time   07/14/12  1:11 PM      Component Value Range   WBC 4.8  4.0 - 10.5 K/uL   RBC 4.13 (*) 4.22 - 5.81 MIL/uL   Hemoglobin 13.5  13.0 - 17.0 g/dL   HCT 96.0  45.4 - 09.8 %   MCV 96.1  78.0 - 100.0 fL   MCH 32.7  26.0 - 34.0 pg   MCHC 34.0  30.0 - 36.0 g/dL   RDW 11.9  14.7 - 82.9 %   Platelets 266  150 - 400 K/uL   Neutrophils Relative 67  43 - 77 %   Neutro Abs 3.2  1.7 - 7.7 K/uL   Lymphocytes Relative 24  12 - 46 %   Lymphs Abs 1.1  0.7 - 4.0 K/uL   Monocytes Relative 7  3 - 12 %   Monocytes Absolute 0.4  0.1 - 1.0 K/uL   Eosinophils Relative 1  0 - 5 %   Eosinophils Absolute 0.0  0.0 - 0.7 K/uL   Basophils Relative 1  0 - 1 %   Basophils Absolute 0.0  0.0 - 0.1 K/uL  BASIC METABOLIC PANEL     Status: Abnormal   Collection Time   07/14/12  1:11 PM      Component Value Range   Sodium 139  135 - 145  mEq/L   Potassium 3.8  3.5 - 5.1 mEq/L   Chloride 103  96 - 112 mEq/L   CO2 27  19 - 32 mEq/L   Glucose, Bld 111 (*) 70 - 99 mg/dL   BUN 21  6 - 23 mg/dL   Creatinine, Ser 5.62  0.50 - 1.35 mg/dL   Calcium 8.8  8.4 - 13.0 mg/dL   GFR calc non Af Amer 87 (*) >90 mL/min   GFR calc Af Amer >90  >90 mL/min  POCT I-STAT TROPONIN I     Status: Normal   Collection Time   07/14/12  1:27 PM      Component Value Range   Troponin i, poc 0.00  0.00 - 0.08 ng/mL   Comment 3  CARDIAC PANEL(CRET KIN+CKTOT+MB+TROPI)     Status: Abnormal   Collection Time   07/14/12  5:17 PM      Component Value Range   Total CK 104  7 - 232 U/L   CK, MB 2.8  0.3 - 4.0 ng/mL   Troponin I <0.30  <0.30 ng/mL   Relative Index 2.7 (*) 0.0 - 2.5  CBC     Status: Normal   Collection Time   07/14/12  6:38 PM      Component Value Range   WBC 7.4  4.0 - 10.5 K/uL   RBC 4.45  4.22 - 5.81 MIL/uL   Hemoglobin 14.7  13.0 - 17.0 g/dL   HCT 16.1  09.6 - 04.5 %   MCV 96.4  78.0 - 100.0 fL   MCH 33.0  26.0 - 34.0 pg   MCHC 34.3  30.0 - 36.0 g/dL   RDW 40.9  81.1 - 91.4 %   Platelets 335  150 - 400 K/uL  CREATININE, SERUM     Status: Normal   Collection Time   07/14/12  6:38 PM      Component Value Range   Creatinine, Ser 0.86  0.50 - 1.35 mg/dL   GFR calc non Af Amer >90  >90 mL/min   GFR calc Af Amer >90  >90 mL/min  TSH     Status: Normal   Collection Time   07/14/12  6:38 PM      Component Value Range   TSH 2.270  0.350 - 4.500 uIU/mL  CARDIAC PANEL(CRET KIN+CKTOT+MB+TROPI)     Status: Normal   Collection Time   07/15/12  1:43 AM      Component Value Range   Total CK 73  7 - 232 U/L   CK, MB 2.2  0.3 - 4.0 ng/mL   Troponin I <0.30  <0.30 ng/mL   Relative Index RELATIVE INDEX IS INVALID  0.0 - 2.5  BASIC METABOLIC PANEL     Status: Normal   Collection Time   07/15/12  2:00 AM      Component Value Range   Sodium 141  135 - 145 mEq/L   Potassium 3.6  3.5 - 5.1 mEq/L   Chloride 106  96 - 112 mEq/L   CO2  27  19 - 32 mEq/L   Glucose, Bld 94  70 - 99 mg/dL   BUN 16  6 - 23 mg/dL   Creatinine, Ser 7.82  0.50 - 1.35 mg/dL   Calcium 8.7  8.4 - 95.6 mg/dL   GFR calc non Af Amer >90  >90 mL/min   GFR calc Af Amer >90  >90 mL/min  CARDIAC PANEL(CRET KIN+CKTOT+MB+TROPI)     Status: Normal   Collection Time   07/15/12  8:45 AM      Component Value Range   Total CK 71  7 - 232 U/L   CK, MB 2.1  0.3 - 4.0 ng/mL   Troponin I <0.30  <0.30 ng/mL   Relative Index RELATIVE INDEX IS INVALID  0.0 - 2.5    Micro Results: No results found for this or any previous visit (from the past 240 hour(s)).  Medications:  Scheduled Meds:   . acyclovir  800 mg Oral TID  . aspirin  243 mg Oral Once  . aspirin  81 mg Oral Daily  . docusate sodium  100 mg Oral BID  . enoxaparin (LOVENOX) injection  40 mg Subcutaneous Q24H  . folic acid  1 mg Oral  Daily  . pantoprazole  40 mg Oral Q1200  . sodium chloride  3 mL Intravenous Q12H  . sodium chloride  3 mL Intravenous Q12H  . verapamil  240 mg Oral Daily  . vitamin C  500 mg Oral Daily  . DISCONTD: folic acid  1,200 mcg Oral Daily   Continuous Infusions:  PRN Meds:.sodium chloride, acetaminophen, acetaminophen, alum & mag hydroxide-simeth, ondansetron (ZOFRAN) IV, ondansetron, sodium chloride, zolpidem  Assessment/Plan: Chest Pain - symptoms resolved.  Pt has ruled out for MI by serial cardiac enzymes ? paroxysmal Afib - will ask cardiology to see him for recommendations - family requesting [facility], ECHO pending Syncope - likely this was a vasovagal syncopal episode, pt responded well to IVF hydration, ECHO, carotids pending HTN - BPs controlled on current meds Anxiety disorder - currently stable History of Meningioma - stable, follow up with neurosurgery Rheumatoid Arthritis - stable   LOS: 1 day   Clanford Johnson 07/15/2012, 12:21 PM  Cleora Fleet, MD, CDE, FAAFP [facility] [place], [place]  [phone]

## 2012-07-15 NOTE — Progress Notes (Signed)
  Echocardiogram 2D Echocardiogram has been performed.  Max [name] 07/15/2012, 2:47 PM

## 2012-07-15 NOTE — Progress Notes (Signed)
VASCULAR LAB PRELIMINARY  PRELIMINARY  PRELIMINARY  PRELIMINARY  Carotid Dopplers completed.    Preliminary report:  There is no ICA stenosis.  Vertebral artery flow is antegrade.  Lenville Hibberd, 07/15/2012, 2:24 PM

## 2012-07-16 ENCOUNTER — Inpatient Hospital Stay (HOSPITAL_COMMUNITY): Payer: BC Managed Care – PPO

## 2012-07-16 DIAGNOSIS — M1990 Unspecified osteoarthritis, unspecified site: Secondary | ICD-10-CM

## 2012-07-16 DIAGNOSIS — B369 Superficial mycosis, unspecified: Secondary | ICD-10-CM

## 2012-07-16 DIAGNOSIS — R079 Chest pain, unspecified: Secondary | ICD-10-CM

## 2012-07-16 DIAGNOSIS — B009 Herpesviral infection, unspecified: Secondary | ICD-10-CM

## 2012-07-16 MED ORDER — TECHNETIUM TC 99M TETROFOSMIN IV KIT
30.0000 | PACK | Freq: Once | INTRAVENOUS | Status: AC | PRN
  Administered 2012-07-16: 30 via INTRAVENOUS

## 2012-07-16 MED ORDER — TECHNETIUM TC 99M TETROFOSMIN IV KIT
10.0000 | PACK | Freq: Once | INTRAVENOUS | Status: AC | PRN
  Administered 2012-07-16: 10 via INTRAVENOUS

## 2012-07-16 NOTE — Discharge Summary (Signed)
Physician Discharge Summary  Patient ID: KAYNAN KLONOWSKI MRN: [id number] DOB/AGE: 03-14-1951 61 y.o.  Admit date: 07/14/2012 Discharge date: 07/16/2012  Discharge Diagnoses:  Active Problems:  HYPERTENSION  Rheumatoid arthritis  Atypical chest pain  Syncope  Chest pain  Vasovagal syncope  Atrial fibrillation CHADS score 1  Discharged Condition: good, stable  Hospital Course:  Mr. 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   1. Chest pain: Typical and atypical features. EKG nonischemic. Cardiac enzymes normal x3. Exercise myoview done on 07/16/12 and negative for ischemia.      2. Paroxysmal Atrial Fibrillation: Likely exacerbated by caffeine intake. Usually controlled by verapamil. CHADS2 score 1(HTN). Cont full dose ASA and Verapamil.   3. Syncope: Likely vasovagal mediated. Carotid dopplers completed with preliminary report showing no ICA stenosis. Echo showed nl LV systolic function, EF 65-70%, grade 2 diastolic dysfunction, no WMAs, mild MR.   4. HTN: Stable. Cont verapamil   5. History of meningioma- CT scan in 07/14/2011 did show some questionable evidence of recurrence. Advised to carefully monitor and follow up with his neurosurgeon in next 2 weeks.  Consults: [facility]  Significant Diagnostic Studies:   Myoview (Exercise) negative for ischemia  ECHO: Study Conclusions - Left ventricle: The cavity size was normal. Wall thickness was normal. Systolic function was vigorous. The estimated ejection fraction was in the range of 65% to 70%. Features are consistent with a pseudonormal left ventricular filling pattern, with concomitant  abnormal relaxation and increased filling pressure (grade 2 diastolic dysfunction). - Mitral valve: Mild regurgitation.   Discharge Exam: Pt says he feels fine, he is anxious to go home, no further CP, no SOB Blood pressure 162/54, pulse 94, temperature 97.9 F (36.6 C), temperature source Oral, resp. rate 20, height 6' (1.829 m), weight 89.359 kg (197 lb), SpO2 95.00%. General - awake, alert, NAD HEENT - NCAT, MMM Neck - supple, no JVD Lungs - BBS CTA CV - normal s1, s2 sounds Abd - soft, nondistended, nontender, no masses Ext - no CCE Neuro - nonfocal  Disposition: 01-Home or Self Care  Discharge Orders    Future Appointments: Provider: Department: Dept Phone: Center:   12/10/2012 8:00 AM [facility] [phone] [facility]   12/20/2012 8:30 AM Roderick Pee, MD [facility] [phone] [facility]     Medication List  As of 07/16/2012  1:40 PM   ASK your doctor about these medications         acyclovir 800 MG tablet   Commonly known as: ZOVIRAX   One tablet 3 times daily      aspirin 81 MG tablet   Take 81 mg by mouth.      CENTRUM SILVER ULTRA MENS PO   Take 1 tablet by mouth daily.      folic acid 400 MCG tablet   Commonly known as: FOLVITE   Take 1,200 mcg by mouth daily.      GLUCOSAMINE 1500 COMPLEX PO   Take 1 tablet by mouth daily.      methotrexate 2.5 MG tablet   Commonly known as: RHEUMATREX   4 tabs Weekly      omeprazole 20 MG capsule   Commonly known as: PRILOSEC   Take 20 mg by  mouth daily.      sildenafil 100 MG tablet   Commonly known as: VIAGRA   Take 1 tablet (100 mg total) by mouth as needed for erectile dysfunction. Use as directed      verapamil 240 MG (CO) 24 hr tablet   Commonly known as: COVERA HS   Take 240 mg by mouth daily.      vitamin C 500 MG tablet   Commonly known as: ASCORBIC ACID   Take 500 mg by mouth daily.           Follow-up Information    Follow up with TODD,JEFFREY ALLEN, MD. Schedule an  appointment as soon as possible for a visit in 1 week. (hospital follow up )    Contact information:   [address] [place] [address] [phone]       Follow up with Cassell Clement, MD in 2 weeks. [facility] Follow Up )    Contact information:   [address] [place] [address] [phone]         I spent 34 mins preparing discharge, reviewing records, labs, imaging, etc.   Signed: Standley Dakins MD [facility] [place], [place] [phone]  07/16/2012

## 2012-07-16 NOTE — Progress Notes (Signed)
DC orders received.  Patient stable with no S/S of distress.  Medication and discharge instructions reviewed with patient and patient's wife.  Patient DC home. Nolon Nations

## 2012-07-16 NOTE — Progress Notes (Signed)
Cardiology Progress Note Patient Name: Max [name] Date of Encounter: 07/16/2012, 10:29 AM     Subjective  No overnight events. Patient denies chest pain, palpitations, dizziness, or sob.   Objective   Telemetry: Unable to review as pt seen briefly in nuc med  Medications: . aspirin  325 mg Oral Daily  . docusate sodium  100 mg Oral BID  . enoxaparin (LOVENOX) injection  40 mg Subcutaneous Q24H  . folic acid  1 mg Oral Daily  . pantoprazole  40 mg Oral Q1200  . sodium chloride  3 mL Intravenous Q12H  . sodium chloride  3 mL Intravenous Q12H  . verapamil  240 mg Oral Daily  . vitamin C  500 mg Oral Daily  . DISCONTD: acyclovir  800 mg Oral TID  . DISCONTD: aspirin  324 mg Oral Daily  . DISCONTD: aspirin  81 mg Oral Daily      Physical Exam: Temp:  [97.2 F (36.2 C)-98.3 F (36.8 C)] 97.9 F (36.6 C) (08/02 0500) Pulse Rate:  [57-67] 57  (08/02 0500) Resp:  [20] 20  (08/01 1345) BP: (102-123)/(60-72) 102/65 mmHg (08/02 0500) SpO2:  [94 %-95 %] 95 % (08/02 0500)  General: Pleasant white male, in no acute distress. Head: Normocephalic, atraumatic, sclera non-icteric, nares are without discharge.  Neck: Supple. No JVD Lungs: Clear bilaterally to auscultation without wheezes, rales, or rhonchi. Breathing is unlabored. Heart: RRR S1 S2 without murmurs, rubs, or gallops.  Abdomen: Soft, non-tender, non-distended with normoactive bowel sounds. No rebound/guarding. No obvious abdominal masses. Msk:  Strength and tone appear normal for age. Extremities: No edema. No clubbing or cyanosis. Distal pedal pulses are intact and equal bilaterally. Neuro: Alert and oriented X 3. Moves all extremities spontaneously. Psych:  Responds to questions appropriately with a normal affect.   Intake/Output Summary (Last 24 hours) at 07/16/12 1029 Last data filed at 07/15/12 1300  Gross per 24 hour  Intake    360 ml  Output      0 ml  Net    360 ml    Labs:  Basename 07/15/12  0200 07/14/12 1838 07/14/12 1311  NA 141 -- 139  K 3.6 -- 3.8  CL 106 -- 103  CO2 27 -- 27  GLUCOSE 94 -- 111*  BUN 16 -- 21  CREATININE 0.84 0.86 --  CALCIUM 8.7 -- 8.8   Basename 07/14/12 1838 07/14/12 1311  WBC 7.4 4.8  NEUTROABS -- 3.2  HGB 14.7 13.5  HCT 42.9 39.7  MCV 96.4 96.1  PLT 335 266   Basename 07/15/12 0845 07/15/12 0143 07/14/12 1717  CKTOTAL 71 73 104  CKMB 2.1 2.2 2.8  TROPONINI <0.30 <0.30 <0.30   Basename 07/14/12 1838  TSH 2.270    Radiology/Studies:    07/15/12 - 2D Echo Study Conclusions: - Left ventricle: The cavity size was normal. Wall thickness was normal. Systolic function was vigorous. The estimated ejection fraction was in the range of 65% to 70%. Features are consistent with a pseudonormal left ventricular filling pattern, with concomitant abnormal  relaxation and increased filling pressure (grade 2 diastolic dysfunction). - Mitral valve: Mild regurgitation.  07/15/12 - Carotid Dopplers  Preliminary report: There is no ICA stenosis. Vertebral artery flow is antegrade  07/14/2012 - Chest 2 View Findings: Cardiomediastinal silhouette is stable.  No acute infiltrate or pleural effusion.  No pulmonary edema.  Mild degenerative changes thoracic spine.  Stable left basilar scarring.  IMPRESSION: No active disease.  No significant change.       Assessment and Plan  Mr. 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   1. Chest pain: Typical and atypical features. EKG nonischemic. Cardiac enzymes normal x3.  Exercise myoview today. Further plans pending results.  2. Paroxysmal Atrial Fibrillation: Likely exacerbated by caffeine intake.  Usually controlled by verapamil. CHADS2 score 1(HTN). Cont full dose ASA and Verapamil.   3. Syncope: Likely vasovagal mediated. Carotid dopplers completed with preliminary report showing no ICA stenosis. Echo showed nl LV systolic function, EF 65-70%, grade 2 diastolic dysfunction, no WMAs, mild MR.  4. HTN: Stable. Cont verapamil  5. History of meningioma- CT scan in 07/14/2011 did show some questionable evidence of recurrence. Advised to carefully monitor and follow as recommended.   Signed, HOPE, JESSICA PA-C  Attending Note:   The patient was seen and examined.  Agree with assessment and plan as noted above.  Pt is feeling well.  He wants go home.  Has frequent PVCs on exam.    Awaiting myoview results.  Vesta Mixer, Montez Hageman., MD, [facility] 07/16/2012, 4:26 PM

## 2012-07-16 NOTE — ED Provider Notes (Signed)
Medical screening examination/treatment/procedure(s) were performed by non-physician practitioner and as supervising physician I was immediately available for consultation/collaboration.  Juliet Rude. Rubin Payor, MD 07/16/12 1054

## 2012-10-19 ENCOUNTER — Other Ambulatory Visit: Payer: Self-pay | Admitting: Family Medicine

## 2012-12-10 ENCOUNTER — Other Ambulatory Visit (INDEPENDENT_AMBULATORY_CARE_PROVIDER_SITE_OTHER): Payer: BC Managed Care – PPO

## 2012-12-10 DIAGNOSIS — Z0000 Encounter for general adult medical examination without abnormal findings: Secondary | ICD-10-CM

## 2012-12-10 LAB — BASIC METABOLIC PANEL
CO2: 28 mEq/L (ref 19–32)
Calcium: 9.7 mg/dL (ref 8.4–10.5)
Chloride: 105 mEq/L (ref 96–112)
Creatinine, Ser: 0.9 mg/dL (ref 0.4–1.5)
Glucose, Bld: 90 mg/dL (ref 70–99)

## 2012-12-10 LAB — LIPID PANEL
Cholesterol: 193 mg/dL (ref 0–200)
VLDL: 11.8 mg/dL (ref 0.0–40.0)

## 2012-12-10 LAB — CBC WITH DIFFERENTIAL/PLATELET
Basophils Absolute: 0.1 10*3/uL (ref 0.0–0.1)
Basophils Relative: 1 % (ref 0.0–3.0)
Eosinophils Absolute: 0.1 10*3/uL (ref 0.0–0.7)
Hemoglobin: 14.7 g/dL (ref 13.0–17.0)
Lymphocytes Relative: 30.2 % (ref 12.0–46.0)
MCHC: 33.9 g/dL (ref 30.0–36.0)
MCV: 96.9 fl (ref 78.0–100.0)
Monocytes Absolute: 0.6 10*3/uL (ref 0.1–1.0)
Neutro Abs: 3.2 10*3/uL (ref 1.4–7.7)
Neutrophils Relative %: 56.4 % (ref 43.0–77.0)
RDW: 13.7 % (ref 11.5–14.6)

## 2012-12-10 LAB — POCT URINALYSIS DIPSTICK
Ketones, UA: NEGATIVE
Leukocytes, UA: NEGATIVE
Nitrite, UA: NEGATIVE
Protein, UA: NEGATIVE
Urobilinogen, UA: 0.2
pH, UA: 5.5

## 2012-12-10 LAB — PSA: PSA: 1.06 ng/mL (ref 0.10–4.00)

## 2012-12-10 LAB — HEPATIC FUNCTION PANEL
Albumin: 3.9 g/dL (ref 3.5–5.2)
Alkaline Phosphatase: 83 U/L (ref 39–117)
Total Protein: 7 g/dL (ref 6.0–8.3)

## 2012-12-20 ENCOUNTER — Encounter: Payer: Self-pay | Admitting: Family Medicine

## 2012-12-20 ENCOUNTER — Ambulatory Visit (INDEPENDENT_AMBULATORY_CARE_PROVIDER_SITE_OTHER): Payer: BC Managed Care – PPO | Admitting: Family Medicine

## 2012-12-20 VITALS — BP 160/80 | Temp 98.3°F | Ht 73.0 in | Wt 206.0 lb

## 2012-12-20 DIAGNOSIS — Z23 Encounter for immunization: Secondary | ICD-10-CM

## 2012-12-20 DIAGNOSIS — M069 Rheumatoid arthritis, unspecified: Secondary | ICD-10-CM

## 2012-12-20 DIAGNOSIS — I10 Essential (primary) hypertension: Secondary | ICD-10-CM

## 2012-12-20 DIAGNOSIS — N529 Male erectile dysfunction, unspecified: Secondary | ICD-10-CM

## 2012-12-20 DIAGNOSIS — B009 Herpesviral infection, unspecified: Secondary | ICD-10-CM

## 2012-12-20 MED ORDER — ACYCLOVIR 800 MG PO TABS: ORAL_TABLET | ORAL | Status: DC

## 2012-12-20 MED ORDER — SILDENAFIL CITRATE 100 MG PO TABS: 100.0000 mg | ORAL_TABLET | ORAL | Status: DC | PRN

## 2012-12-20 MED ORDER — VERAPAMIL HCL 240 MG (CO) PO TB24: 240.0000 mg | ORAL_TABLET | Freq: Every day | ORAL | Status: DC

## 2012-12-20 NOTE — Patient Instructions (Signed)
Continue your current medications  Take Motrin 600 mg twice daily with food for joint pain  If you feel like we need to restart the methotrexate call

## 2012-12-20 NOTE — Progress Notes (Signed)
  Subjective:    Patient ID: Max [name], male    DOB: 12-Jun-1951, 62 y.o.   MRN: [id number]  HPI Max [name] is a 62 year old married male nonsmoker who comes in today for general physical examination because of a history of hypertension, rheumatoid arthritis, recurrent oral HSV, and a brain tumor  He has stopped his methotrexate for about 4 weeks in the fall and restarted for a month it didn't seem to help so he stopped it completely.  He takes acyclovir when necessary  He takes drop male 240 mg daily for hypertension and rate control no recurrent episodes of A. fib  He uses Viagra when necessary for ED when necessary  He recently had an evaluation by his neurosurgeon Dr. Jeral Fruit. He's asymptomatic followup MRI shows some calcifications he is due for followup study in 6 months.  Review of Systems  Constitutional: Negative.   HENT: Negative.   Eyes: Negative.   Respiratory: Negative.   Cardiovascular: Negative.   Gastrointestinal: Negative.   Genitourinary: Negative.   Musculoskeletal: Negative.   Skin: Negative.   Neurological: Negative.   Hematological: Negative.   Psychiatric/Behavioral: Negative.        Objective:   Physical Exam  Constitutional: He is oriented to person, place, and time. He appears well-developed and well-nourished.  HENT:  Head: Normocephalic and atraumatic.  Right Ear: External ear normal.  Left Ear: External ear normal.  Nose: Nose normal.  Mouth/Throat: Oropharynx is clear and moist.  Eyes: Conjunctivae normal and EOM are normal. Pupils are equal, round, and reactive to light.  Neck: Normal range of motion. Neck supple. No JVD present. No tracheal deviation present. No thyromegaly present.  Cardiovascular: Normal rate, regular rhythm, normal heart sounds and intact distal pulses.  Exam reveals no gallop and no friction rub.   No murmur heard. Pulmonary/Chest: Effort normal and breath sounds normal. No stridor. No respiratory distress. He has no  wheezes. He has no rales. He exhibits no tenderness.  Abdominal: Soft. Bowel sounds are normal. He exhibits no distension and no mass. There is no tenderness. There is no rebound and no guarding.  Genitourinary: Rectum normal, prostate normal and penis normal. Guaiac negative stool. No penile tenderness.  Musculoskeletal: Normal range of motion. He exhibits no edema and no tenderness.  Lymphadenopathy:    He has no cervical adenopathy.  Neurological: He is alert and oriented to person, place, and time. He has normal reflexes. No cranial nerve deficit. He exhibits normal muscle tone.  Skin: Skin is warm and dry. No rash noted. No erythema. No pallor.       Total body skin exam normal except for bruise right eye and left eye from fall at work  Psychiatric: He has a normal mood and affect. His behavior is normal. Judgment and thought content normal.          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

## 2012-12-21 ENCOUNTER — Other Ambulatory Visit: Payer: Self-pay | Admitting: Family Medicine

## 2013-03-13 ENCOUNTER — Other Ambulatory Visit: Payer: Self-pay | Admitting: Family Medicine

## 2013-03-24 ENCOUNTER — Telehealth: Payer: Self-pay | Admitting: *Deleted

## 2013-03-24 DIAGNOSIS — I10 Essential (primary) hypertension: Secondary | ICD-10-CM

## 2013-03-24 DIAGNOSIS — M069 Rheumatoid arthritis, unspecified: Secondary | ICD-10-CM

## 2013-03-24 NOTE — Telephone Encounter (Signed)
Pt is sch for 4-14 °

## 2013-03-24 NOTE — Telephone Encounter (Signed)
Please schedule patient for labs. Orders placed.

## 2013-03-28 ENCOUNTER — Other Ambulatory Visit (INDEPENDENT_AMBULATORY_CARE_PROVIDER_SITE_OTHER): Payer: BC Managed Care – PPO

## 2013-03-28 DIAGNOSIS — M069 Rheumatoid arthritis, unspecified: Secondary | ICD-10-CM

## 2013-03-28 DIAGNOSIS — I10 Essential (primary) hypertension: Secondary | ICD-10-CM

## 2013-03-28 LAB — CBC WITH DIFFERENTIAL/PLATELET
Basophils Relative: 0.7 % (ref 0.0–3.0)
Eosinophils Absolute: 0.1 10*3/uL (ref 0.0–0.7)
Eosinophils Relative: 1.2 % (ref 0.0–5.0)
HCT: 41.2 % (ref 39.0–52.0)
Hemoglobin: 13.8 g/dL (ref 13.0–17.0)
Lymphs Abs: 1.5 10*3/uL (ref 0.7–4.0)
MCHC: 33.5 g/dL (ref 30.0–36.0)
MCV: 97.8 fl (ref 78.0–100.0)
Monocytes Absolute: 0.4 10*3/uL (ref 0.1–1.0)
Neutro Abs: 3.5 10*3/uL (ref 1.4–7.7)
Neutrophils Relative %: 64 % (ref 43.0–77.0)
RBC: 4.21 Mil/uL — ABNORMAL LOW (ref 4.22–5.81)
WBC: 5.5 10*3/uL (ref 4.5–10.5)

## 2013-03-28 LAB — BASIC METABOLIC PANEL
BUN: 19 mg/dL (ref 6–23)
Chloride: 106 mEq/L (ref 96–112)
Creatinine, Ser: 0.9 mg/dL (ref 0.4–1.5)
GFR: 93.1 mL/min (ref >60.00)
Potassium: 3.8 mEq/L (ref 3.5–5.1)

## 2013-03-28 LAB — HEPATIC FUNCTION PANEL
ALT: 24 U/L (ref 0–53)
Albumin: 3.9 g/dL (ref 3.5–5.2)
Bilirubin, Direct: 0.1 mg/dL (ref 0.0–0.3)
Total Protein: 7.2 g/dL (ref 6.0–8.3)

## 2013-03-30 ENCOUNTER — Other Ambulatory Visit: Payer: Self-pay | Admitting: *Deleted

## 2013-03-30 MED ORDER — METHOTREXATE 2.5 MG PO TABS: ORAL_TABLET | ORAL | Status: DC

## 2013-10-03 ENCOUNTER — Telehealth: Payer: Self-pay | Admitting: Family Medicine

## 2013-10-03 NOTE — Telephone Encounter (Signed)
Pt wife would like rachel to return her call concerning needing blood work. Pt wife also would like dr todd to accept a new pt requesting new pt appt today or tomorrow.

## 2013-10-03 NOTE — Telephone Encounter (Signed)
Pt wife is aware MD will not be able to see new pt until 12-2013.

## 2013-10-03 NOTE — Telephone Encounter (Signed)
Okay per Dr Tawanna Cooler.............Marland Kitchenbut only a new patient 30 min appointment.  Earliest is January.  If this is urgent problem and they can not wait then go to [facility].

## 2013-10-07 ENCOUNTER — Other Ambulatory Visit: Payer: Self-pay | Admitting: Family Medicine

## 2013-12-13 ENCOUNTER — Other Ambulatory Visit: Payer: Self-pay | Admitting: Family Medicine

## 2013-12-14 ENCOUNTER — Other Ambulatory Visit (INDEPENDENT_AMBULATORY_CARE_PROVIDER_SITE_OTHER): Payer: BC Managed Care – PPO

## 2013-12-14 DIAGNOSIS — Z0000 Encounter for general adult medical examination without abnormal findings: Secondary | ICD-10-CM

## 2013-12-14 LAB — CBC WITH DIFFERENTIAL/PLATELET
Basophils Relative: 1.2 % (ref 0.0–3.0)
HCT: 43.5 % (ref 39.0–52.0)
Hemoglobin: 14.9 g/dL (ref 13.0–17.0)
Lymphocytes Relative: 33.5 % (ref 12.0–46.0)
Lymphs Abs: 1.9 10*3/uL (ref 0.7–4.0)
Monocytes Relative: 10.5 % (ref 3.0–12.0)
Neutro Abs: 3.1 10*3/uL (ref 1.4–7.7)
RBC: 4.48 Mil/uL (ref 4.22–5.81)

## 2013-12-14 LAB — POCT URINALYSIS DIPSTICK
Blood, UA: NEGATIVE
Ketones, UA: NEGATIVE
Protein, UA: NEGATIVE
Spec Grav, UA: 1.02
pH, UA: 6

## 2013-12-14 LAB — LIPID PANEL
Cholesterol: 193 mg/dL (ref 0–200)
LDL Cholesterol: 128 mg/dL — ABNORMAL HIGH (ref 0–99)
Total CHOL/HDL Ratio: 4
VLDL: 10.8 mg/dL (ref 0.0–40.0)

## 2013-12-14 LAB — BASIC METABOLIC PANEL
CO2: 28 mEq/L (ref 19–32)
Calcium: 9.2 mg/dL (ref 8.4–10.5)
GFR: 85.04 mL/min (ref >60.00)
Potassium: 4.7 mEq/L (ref 3.5–5.1)
Sodium: 138 mEq/L (ref 135–145)

## 2013-12-14 LAB — HEPATIC FUNCTION PANEL
ALT: 29 U/L (ref 0–53)
AST: 24 U/L (ref 0–37)
Albumin: 4.2 g/dL (ref 3.5–5.2)
Alkaline Phosphatase: 68 U/L (ref 39–117)
Total Protein: 7 g/dL (ref 6.0–8.3)

## 2013-12-14 LAB — RHEUMATOID FACTOR: Rhuematoid fact SerPl-aCnc: 269 IU/mL — ABNORMAL HIGH (ref <=14)

## 2013-12-21 ENCOUNTER — Ambulatory Visit (INDEPENDENT_AMBULATORY_CARE_PROVIDER_SITE_OTHER): Payer: 59 | Admitting: Family Medicine

## 2013-12-21 ENCOUNTER — Encounter: Payer: Self-pay | Admitting: Family Medicine

## 2013-12-21 VITALS — BP 158/82 | Temp 98.3°F | Ht 72.0 in | Wt 203.0 lb

## 2013-12-21 DIAGNOSIS — B009 Herpesviral infection, unspecified: Secondary | ICD-10-CM

## 2013-12-21 DIAGNOSIS — M069 Rheumatoid arthritis, unspecified: Secondary | ICD-10-CM

## 2013-12-21 DIAGNOSIS — I10 Essential (primary) hypertension: Secondary | ICD-10-CM

## 2013-12-21 DIAGNOSIS — Z23 Encounter for immunization: Secondary | ICD-10-CM

## 2013-12-21 DIAGNOSIS — N529 Male erectile dysfunction, unspecified: Secondary | ICD-10-CM

## 2013-12-21 MED ORDER — METHOTREXATE 2.5 MG PO TABS: ORAL_TABLET | ORAL | Status: DC

## 2013-12-21 MED ORDER — VERAPAMIL HCL ER 180 MG PO TBCR: EXTENDED_RELEASE_TABLET | ORAL | Status: DC

## 2013-12-21 MED ORDER — SILDENAFIL CITRATE 100 MG PO TABS: 100.0000 mg | ORAL_TABLET | ORAL | Status: DC | PRN

## 2013-12-21 NOTE — Progress Notes (Signed)
Pre visit review using our clinic review tool, if applicable. No additional management support is needed unless otherwise documented below in the visit note. 

## 2013-12-21 NOTE — Progress Notes (Signed)
   Subjective:    Patient ID: Max [name], male    DOB: July 31, 1951, 63 y.o.   MRN: [id number]  HPI [name] is a 63 year old married male nonsmoker who comes in today for general physical examination because of a history of hypertension, rheumatoid arthritis, erectile dysfunction  He has tapered down on his methotrexate to 2.5 mg weekly. He's asymptomatic. We initially had him on 10 mg weekly.  Uses Viagra when necessary for ED acyclovir when necessary for HSV 1 flares and verapamil 180 mg daily for hypertension BP at home and 30/70    Review of Systems  Constitutional: Negative.   HENT: Negative.   Eyes: Negative.   Respiratory: Negative.   Cardiovascular: Negative.   Gastrointestinal: Negative.   Genitourinary: Negative.   Musculoskeletal: Negative.   Skin: Negative.   Neurological: Negative.   Psychiatric/Behavioral: Negative.        Objective:   Physical Exam  Nursing note and vitals reviewed. Constitutional: He is oriented to person, place, and time. He appears well-developed and well-nourished.  HENT:  Head: Normocephalic and atraumatic.  Right Ear: External ear normal.  Left Ear: External ear normal.  Nose: Nose normal.  Mouth/Throat: Oropharynx is clear and moist.  Eyes: Conjunctivae and EOM are normal. Pupils are equal, round, and reactive to light.  Neck: Normal range of motion. Neck supple. No JVD present. No tracheal deviation present. No thyromegaly present.  Cardiovascular: Normal rate, regular rhythm, normal heart sounds and intact distal pulses.  Exam reveals no gallop and no friction rub.   No murmur heard. No carotid or Eric bruits peripheral pulses 2+ and symmetrical  Pulmonary/Chest: Effort normal and breath sounds normal. No stridor. No respiratory distress. He has no wheezes. He has no rales. He exhibits no tenderness.  Abdominal: Soft. Bowel sounds are normal. He exhibits no distension and no mass. There is no tenderness. There is no rebound and no  guarding.  Genitourinary: Rectum normal, prostate normal and penis normal. Guaiac negative stool. No penile tenderness.  Musculoskeletal: Normal range of motion. He exhibits no edema and no tenderness.  Lymphadenopathy:    He has no cervical adenopathy.  Neurological: He is alert and oriented to person, place, and time. He has normal reflexes. No cranial nerve deficit. He exhibits normal muscle tone.  Skin: Skin is warm and dry. No rash noted. No erythema. No pallor.  Total body skin exam normal  Psychiatric: He has a normal mood and affect. His behavior is normal. Judgment and thought content normal.          Assessment & Plan:  Healthy male  Rheumatoid arthritis in remission continue methotrexate 2.5 mg weekly Prilosec and folic acid  Hypertension can continue verapamil 180 daily  HSV 1 acyclovir when necessary  Erectile dysfunction Viagra when necessary

## 2013-12-21 NOTE — Patient Instructions (Signed)
Continue your current medications  You're doing very well in one methotrexate tablet weekly so that's continue that  If you have a flare increase the dose to 10 mg weekly  Return in one year sooner if any problems  [email or web address]

## 2014-01-16 ENCOUNTER — Telehealth: Payer: Self-pay | Admitting: Family Medicine

## 2014-01-16 NOTE — Telephone Encounter (Signed)
Relevant patient education mailed to patient.  

## 2014-02-04 ENCOUNTER — Emergency Department (HOSPITAL_COMMUNITY): Payer: 59

## 2014-02-04 ENCOUNTER — Encounter (HOSPITAL_COMMUNITY): Payer: Self-pay | Admitting: Emergency Medicine

## 2014-02-04 ENCOUNTER — Observation Stay (HOSPITAL_COMMUNITY)
Admission: EM | Admit: 2014-02-04 | Discharge: 2014-02-05 | Disposition: A | Discharge status: Home / Self Care | Payer: 59 | Attending: Internal Medicine | Admitting: Internal Medicine

## 2014-02-04 DIAGNOSIS — Y9289 Other specified places as the place of occurrence of the external cause: Secondary | ICD-10-CM | POA: Diagnosis not present

## 2014-02-04 DIAGNOSIS — G40909 Epilepsy, unspecified, not intractable, without status epilepticus: Secondary | ICD-10-CM | POA: Diagnosis not present

## 2014-02-04 DIAGNOSIS — R296 Repeated falls: Secondary | ICD-10-CM | POA: Diagnosis not present

## 2014-02-04 DIAGNOSIS — R404 Transient alteration of awareness: Secondary | ICD-10-CM | POA: Diagnosis present

## 2014-02-04 DIAGNOSIS — I4891 Unspecified atrial fibrillation: Secondary | ICD-10-CM | POA: Diagnosis not present

## 2014-02-04 DIAGNOSIS — Z79899 Other long term (current) drug therapy: Secondary | ICD-10-CM | POA: Diagnosis not present

## 2014-02-04 DIAGNOSIS — R55 Syncope and collapse: Secondary | ICD-10-CM | POA: Diagnosis not present

## 2014-02-04 DIAGNOSIS — S0990XA Unspecified injury of head, initial encounter: Secondary | ICD-10-CM | POA: Insufficient documentation

## 2014-02-04 DIAGNOSIS — M064 Inflammatory polyarthropathy: Secondary | ICD-10-CM | POA: Diagnosis not present

## 2014-02-04 DIAGNOSIS — Y939 Activity, unspecified: Secondary | ICD-10-CM | POA: Insufficient documentation

## 2014-02-04 DIAGNOSIS — I10 Essential (primary) hypertension: Secondary | ICD-10-CM | POA: Diagnosis not present

## 2014-02-04 DIAGNOSIS — R42 Dizziness and giddiness: Secondary | ICD-10-CM | POA: Insufficient documentation

## 2014-02-04 DIAGNOSIS — R112 Nausea with vomiting, unspecified: Secondary | ICD-10-CM | POA: Insufficient documentation

## 2014-02-04 DIAGNOSIS — G2581 Restless legs syndrome: Secondary | ICD-10-CM | POA: Diagnosis not present

## 2014-02-04 DIAGNOSIS — I480 Paroxysmal atrial fibrillation: Secondary | ICD-10-CM

## 2014-02-04 DIAGNOSIS — R630 Anorexia: Secondary | ICD-10-CM | POA: Diagnosis not present

## 2014-02-04 DIAGNOSIS — N529 Male erectile dysfunction, unspecified: Secondary | ICD-10-CM | POA: Diagnosis not present

## 2014-02-04 DIAGNOSIS — Z7982 Long term (current) use of aspirin: Secondary | ICD-10-CM | POA: Insufficient documentation

## 2014-02-04 DIAGNOSIS — I209 Angina pectoris, unspecified: Secondary | ICD-10-CM | POA: Insufficient documentation

## 2014-02-04 DIAGNOSIS — Z8619 Personal history of other infectious and parasitic diseases: Secondary | ICD-10-CM | POA: Insufficient documentation

## 2014-02-04 DIAGNOSIS — M069 Rheumatoid arthritis, unspecified: Secondary | ICD-10-CM

## 2014-02-04 LAB — URINALYSIS, ROUTINE W REFLEX MICROSCOPIC
BILIRUBIN URINE: NEGATIVE
GLUCOSE, UA: NEGATIVE mg/dL
HGB URINE DIPSTICK: NEGATIVE
KETONES UR: NEGATIVE mg/dL
Leukocytes, UA: NEGATIVE
Nitrite: NEGATIVE
PROTEIN: 30 mg/dL — AB
Specific Gravity, Urine: 1.028 (ref 1.005–1.030)
Urobilinogen, UA: 1 mg/dL (ref 0.0–1.0)
pH: 7.5 (ref 5.0–8.0)

## 2014-02-04 LAB — CBC WITH DIFFERENTIAL/PLATELET
BASOS ABS: 0 10*3/uL (ref 0.0–0.1)
BASOS PCT: 0 % (ref 0–1)
EOS ABS: 0.1 10*3/uL (ref 0.0–0.7)
Eosinophils Relative: 1 % (ref 0–5)
HEMATOCRIT: 45.2 % (ref 39.0–52.0)
HEMOGLOBIN: 15.5 g/dL (ref 13.0–17.0)
Lymphocytes Relative: 10 % — ABNORMAL LOW (ref 12–46)
Lymphs Abs: 1.7 10*3/uL (ref 0.7–4.0)
MCH: 33.3 pg (ref 26.0–34.0)
MCHC: 34.3 g/dL (ref 30.0–36.0)
MCV: 97.2 fL (ref 78.0–100.0)
MONO ABS: 1.3 10*3/uL — AB (ref 0.1–1.0)
MONOS PCT: 8 % (ref 3–12)
Neutro Abs: 13.8 10*3/uL — ABNORMAL HIGH (ref 1.7–7.7)
Neutrophils Relative %: 82 % — ABNORMAL HIGH (ref 43–77)
Platelets: 333 10*3/uL (ref 150–400)
RBC: 4.65 MIL/uL (ref 4.22–5.81)
RDW: 13.1 % (ref 11.5–15.5)
WBC: 17 10*3/uL — ABNORMAL HIGH (ref 4.0–10.5)

## 2014-02-04 LAB — CBC
HEMATOCRIT: 40.9 % (ref 39.0–52.0)
Hemoglobin: 14 g/dL (ref 13.0–17.0)
MCH: 33.5 pg (ref 26.0–34.0)
MCHC: 34.2 g/dL (ref 30.0–36.0)
MCV: 97.8 fL (ref 78.0–100.0)
Platelets: 301 10*3/uL (ref 150–400)
RBC: 4.18 MIL/uL — AB (ref 4.22–5.81)
RDW: 13.2 % (ref 11.5–15.5)
WBC: 11.1 10*3/uL — ABNORMAL HIGH (ref 4.0–10.5)

## 2014-02-04 LAB — BASIC METABOLIC PANEL
BUN: 21 mg/dL (ref 6–23)
CALCIUM: 8.7 mg/dL (ref 8.4–10.5)
CO2: 28 mEq/L (ref 19–32)
CREATININE: 0.92 mg/dL (ref 0.50–1.35)
Chloride: 104 mEq/L (ref 96–112)
GFR calc Af Amer: >90 mL/min (ref >90)
GFR, EST NON AFRICAN AMERICAN: 88 mL/min — AB (ref >90)
Glucose, Bld: 121 mg/dL — ABNORMAL HIGH (ref 70–99)
Potassium: 3.9 mEq/L (ref 3.7–5.3)
SODIUM: 143 meq/L (ref 137–147)

## 2014-02-04 LAB — CREATININE, SERUM
Creatinine, Ser: 0.88 mg/dL (ref 0.50–1.35)
GFR calc Af Amer: >90 mL/min (ref >90)
GFR calc non Af Amer: 90 mL/min — ABNORMAL LOW (ref >90)

## 2014-02-04 LAB — URINE MICROSCOPIC-ADD ON

## 2014-02-04 LAB — TROPONIN I
Troponin I: <0.3 ng/mL (ref <0.30)
Troponin I: <0.3 ng/mL (ref <0.30)

## 2014-02-04 MED ORDER — ENOXAPARIN SODIUM 40 MG/0.4ML ~~LOC~~ SOLN
40.0000 mg | SUBCUTANEOUS | Status: DC
  Administered 2014-02-04: 40 mg via SUBCUTANEOUS
  Filled 2014-02-04 (×2): qty 0.4

## 2014-02-04 MED ORDER — PANTOPRAZOLE SODIUM 40 MG PO TBEC
40.0000 mg | DELAYED_RELEASE_TABLET | Freq: Every day | ORAL | Status: DC
  Administered 2014-02-04 – 2014-02-05 (×2): 40 mg via ORAL
  Filled 2014-02-04 (×2): qty 1

## 2014-02-04 MED ORDER — OXYCODONE-ACETAMINOPHEN 5-325 MG PO TABS
1.0000 | ORAL_TABLET | Freq: Once | ORAL | Status: AC
Start: 2014-02-04 — End: 2014-02-04
  Administered 2014-02-04: 1 via ORAL
  Filled 2014-02-04: qty 1

## 2014-02-04 MED ORDER — FENTANYL CITRATE 0.05 MG/ML IJ SOLN
50.0000 ug | Freq: Once | INTRAMUSCULAR | Status: AC
Start: 2014-02-04 — End: 2014-02-04
  Administered 2014-02-04: 50 ug via INTRAVENOUS
  Filled 2014-02-04: qty 2

## 2014-02-04 MED ORDER — GUAIFENESIN-DM 100-10 MG/5ML PO SYRP
5.0000 mL | ORAL_SOLUTION | ORAL | Status: DC | PRN
  Filled 2014-02-04: qty 5

## 2014-02-04 MED ORDER — ASPIRIN EC 325 MG PO TBEC
325.0000 mg | DELAYED_RELEASE_TABLET | Freq: Every day | ORAL | Status: DC
  Administered 2014-02-04 – 2014-02-05 (×2): 325 mg via ORAL
  Filled 2014-02-04 (×2): qty 1

## 2014-02-04 MED ORDER — ALBUTEROL SULFATE (2.5 MG/3ML) 0.083% IN NEBU: 2.5000 mg | INHALATION_SOLUTION | RESPIRATORY_TRACT | Status: DC | PRN

## 2014-02-04 MED ORDER — VERAPAMIL HCL ER 180 MG PO TBCR
180.0000 mg | EXTENDED_RELEASE_TABLET | Freq: Every day | ORAL | Status: DC
  Administered 2014-02-04: 180 mg via ORAL
  Filled 2014-02-04: qty 1

## 2014-02-04 MED ORDER — SODIUM CHLORIDE 0.9 % IV SOLN
INTRAVENOUS | Status: AC
  Administered 2014-02-04: 18:00:00 via INTRAVENOUS
  Administered 2014-02-05: 1000 mL via INTRAVENOUS

## 2014-02-04 MED ORDER — ONDANSETRON HCL 4 MG/2ML IJ SOLN
4.0000 mg | Freq: Once | INTRAMUSCULAR | Status: AC
  Administered 2014-02-04: 4 mg via INTRAVENOUS
  Filled 2014-02-04: qty 2

## 2014-02-04 MED ORDER — ACETAMINOPHEN 325 MG PO TABS
650.0000 mg | ORAL_TABLET | Freq: Four times a day (QID) | ORAL | Status: DC | PRN
  Administered 2014-02-05 (×2): 650 mg via ORAL
  Filled 2014-02-04 (×2): qty 2

## 2014-02-04 MED ORDER — ALUM & MAG HYDROXIDE-SIMETH 200-200-20 MG/5ML PO SUSP: 30.0000 mL | Freq: Four times a day (QID) | ORAL | Status: DC | PRN

## 2014-02-04 MED ORDER — ACETAMINOPHEN 500 MG PO TABS
1000.0000 mg | ORAL_TABLET | Freq: Once | ORAL | Status: AC
  Administered 2014-02-04: 1000 mg via ORAL
  Filled 2014-02-04: qty 2

## 2014-02-04 MED ORDER — ACETAMINOPHEN 650 MG RE SUPP: 650.0000 mg | Freq: Four times a day (QID) | RECTAL | Status: DC | PRN

## 2014-02-04 MED ORDER — ONDANSETRON HCL 4 MG/2ML IJ SOLN: 4.0000 mg | Freq: Four times a day (QID) | INTRAMUSCULAR | Status: DC | PRN

## 2014-02-04 MED ORDER — OXYCODONE HCL 5 MG PO TABS
5.0000 mg | ORAL_TABLET | Freq: Four times a day (QID) | ORAL | Status: DC | PRN
  Administered 2014-02-04: 5 mg via ORAL
  Filled 2014-02-04: qty 1

## 2014-02-04 MED ORDER — ONDANSETRON HCL 4 MG PO TABS: 4.0000 mg | ORAL_TABLET | Freq: Four times a day (QID) | ORAL | Status: DC | PRN

## 2014-02-04 MED ORDER — SODIUM CHLORIDE 0.9 % IV BOLUS (SEPSIS)
1000.0000 mL | Freq: Once | INTRAVENOUS | Status: AC
  Administered 2014-02-04: 1000 mL via INTRAVENOUS

## 2014-02-04 NOTE — ED Provider Notes (Signed)
CSN: [id number]     Arrival date & time 02/04/14  3267 History   First MD Initiated Contact with Patient 02/04/14 0550     Chief Complaint  Patient presents with  . Loss of Consciousness  . Fall  . Headache  . Nausea  . Emesis     (Consider location/radiation/quality/duration/timing/severity/associated sxs/prior Treatment) HPI Comments: 63 yo male with htn, chol, meningioma history presents after syncope around 530 am today.  Pt had [place] for dinner and since then did not feel well.  He woke up at 530 am to get a tums and felt nauseated then lightheaded then the next thing her remembers is his wife.  He has recollection of actual event, wife heard him and found on bathroom floor, unknown head injury.  No seizure activity.  No new meds or changes.  No bleeding.  Vomited once.  Generalized HA currently.  No cp or sob.  No ha prior to syncope.   Patient is a 63 y.o. male presenting with syncope, fall, headaches, and vomiting. The history is provided by the patient.  Loss of Consciousness Episode history:  Single Associated symptoms: headaches, nausea and vomiting   Associated symptoms: no chest pain, no fever and no shortness of breath   Fall Associated symptoms include headaches. Pertinent negatives include no chest pain, no abdominal pain and no shortness of breath.  Headache Associated symptoms: nausea, syncope and vomiting   Associated symptoms: no abdominal pain, no back pain, no congestion, no fever, no neck pain and no neck stiffness   Emesis Associated symptoms: headaches   Associated symptoms: no abdominal pain and no chills     Past Medical History  Diagnosis Date  . RLS (restless legs syndrome)   . Allergic rhinitis   . Hypertension   . Herpes   . ED (erectile dysfunction)   . RA (rheumatoid arthritis)   . Vasovagal syncope   . Anginal pain   . Seizures 05/14/2011    "first and only"  . Paroxysmal atrial fibrillation 07/15/2012    a) CHADSVASc score 1b) On  full-dose ASA and Verapamil  . History of syncope    Past Surgical History  Procedure Laterality Date  . Flexible sigmoidoscopy    . Refractive surgery  ~ 2003  . Cataract extraction w/ intraocular lens implant  ~ 2001    right  . Eye surgery  1964    right; "hit w/baseball; eye hemorrhaged"  . Brain meningioma excision  2012   Family History  Problem Relation Age of Onset  . Depression Other   . Hypertension Other   . GI problems Other   . Lung disease Other    History  Substance Use Topics  . Smoking status: Never Smoker   . Smokeless tobacco: Never Used  . Alcohol Use: No     Comment: 07/14/2012 "haven't had any alcohol in 1 yr"    Review of Systems  Constitutional: Positive for appetite change. Negative for fever and chills.  HENT: Negative for congestion.   Eyes: Negative for visual disturbance.  Respiratory: Negative for shortness of breath.   Cardiovascular: Positive for syncope. Negative for chest pain.  Gastrointestinal: Positive for nausea and vomiting. Negative for abdominal pain.  Genitourinary: Negative for dysuria and flank pain.  Musculoskeletal: Negative for back pain, neck pain and neck stiffness.  Skin: Negative for rash.  Neurological: Positive for light-headedness and headaches.      Allergies  Vicodin  Home Medications   Current Outpatient Rx  Name  Route  Sig  Dispense  Refill  . acyclovir (ZOVIRAX) 800 MG tablet   Oral   Take 800 mg by mouth daily as needed (for outbreak).         . Ascorbic Acid (VITAMIN C PO)   Oral   Take 1 tablet by mouth daily.         Marland Kitchen aspirin 81 MG tablet   Oral   Take 81 mg by mouth.           . folic acid (FOLVITE) 1 MG tablet   Oral   Take 1 mg by mouth daily.         . methotrexate 2.5 MG tablet   Oral   Take 2.5 mg by mouth once a week. Take on Sunday         . Methylsulfonylmethane 1000 MG TABS   Oral   Take 2 tablets by mouth 2 (two) times daily.         . Multiple  Vitamins-Minerals (CENTRUM SILVER ULTRA MENS PO)   Oral   Take 1 tablet by mouth daily.         Marland Kitchen omeprazole (PRILOSEC) 20 MG capsule   Oral   Take 20 mg by mouth daily.         . sildenafil (VIAGRA) 100 MG tablet   Oral   Take 1 tablet (100 mg total) by mouth as needed for erectile dysfunction. Use as directed   10 tablet   11   . verapamil (CALAN-SR) 180 MG CR tablet   Oral   Take 180 mg by mouth daily.          BP 114/59  Pulse 64  Temp(Src) 97.8 F (36.6 C) (Oral)  Resp 17  SpO2 94% Physical Exam  Nursing note and vitals reviewed. Constitutional: He is oriented to person, place, and time. He appears well-developed and well-nourished.  HENT:  Head: Normocephalic.  Eyes: Conjunctivae are normal. Right eye exhibits no discharge. Left eye exhibits no discharge.  Neck: Normal range of motion. Neck supple. No tracheal deviation present.  Cardiovascular: Normal rate, regular rhythm and intact distal pulses.   No murmur heard. Pulmonary/Chest: Effort normal and breath sounds normal.  Abdominal: Soft. He exhibits no distension. There is no tenderness. There is no guarding.  Musculoskeletal: He exhibits no edema and no tenderness.  Full rom of hips, knees, shoulders without pain No pain midline vertebral bodies thoracic, cervical or lumbar, full rom of neck  Neurological: He is alert and oriented to person, place, and time. No cranial nerve deficit. GCS eye subscore is 4. GCS verbal subscore is 5. GCS motor subscore is 6.  5+ strength in UE and LE with f/e at major joints. Sensation to palpation intact in UE and LE. CNs 2-12 grossly intact.  EOMFI.  PERRL.   Finger nose and coordination intact bilateral.   Visual fields intact to finger testing.   Skin: Skin is warm. No rash noted.  Psychiatric: He has a normal mood and affect.    ED Course  Procedures (including critical care time)  EMERGENCY DEPARTMENT BILIARY ULTRASOUND INTERPRETATION "Study: Limited Abdominal  Ultrasound of the gallbladder and common bile duct."  INDICATIONS: Nausea and Vomiting and wbc elevation Indication: Multiple views of the gallbladder and common bile duct were obtained in real-time with a Multi-frequency probe." PERFORMED BY:  Myself IMAGES ARCHIVED?: Yes FINDINGS: Gallstones absent, Gallbladder wall normal in thickness and Sonographic Murphy's sign absent LIMITATIONS: Bowel Gas INTERPRETATION: Normal but limited  Labs Review Labs Reviewed  BASIC METABOLIC PANEL - Abnormal; Notable for the following:    Glucose, Bld 121 (*)    GFR calc non Af Amer 88 (*)    All other components within normal limits  CBC WITH DIFFERENTIAL - Abnormal; Notable for the following:    WBC 17.0 (*)    Neutrophils Relative % 82 (*)    Neutro Abs 13.8 (*)    Lymphocytes Relative 10 (*)    Monocytes Absolute 1.3 (*)    All other components within normal limits  TROPONIN I  URINALYSIS, ROUTINE W REFLEX MICROSCOPIC  TROPONIN I   Imaging Review No results found.  EKG Interpretation    Date/Time:  Saturday February 04 2014 05:46:26 EST Ventricular Rate:  82 PR Interval:  153 QRS Duration: 109 QT Interval:  392 QTC Calculation: 458 R Axis:   94 Text Interpretation:  Sinus rhythm Similar to previous Confirmed by Griffon Herberg  MD, Belladonna Lubinski ([id number]) on 02/04/2014 5:52:14 AM            MDM   Final diagnoses:  None    Syncope - likely vasovagal with sxs since dinner, prolonged lightheaded prior to fall. Cardiac cause on differential. EKG no acute findings. Unsure if HA from impact vs HA during syncope event and with meningioma hx CT head ordered. Pain meds for HA.  No cp or sob, screening troponins due to nausea sxs. WBC elevated, no source on exam, lungs clear, no abdominal pain,  CXR and UA added. With vomiting and wbc limited bedside US done, no stone or gb thickening, no focal ruq pain.  Discussed observation in hospital vs close outpt fup for Holter/ cardiology. Rechecked,  mild paraspinal thoracic pain, reproduceable, xray to look for fx.  Considered dissection however reproducable, no cp or tearing, equal pulses, well appearing, vitals unremarkable.   Pt will fup with cardiology/ pcp for holter.   Signed out with plan to fup results, recheck and dispo.  Syncope, Nausea, Headache     Mariea Clonts, MD 02/04/14 [phone]

## 2014-02-04 NOTE — H&P (Signed)
PATIENT DETAILS Name: Max [name] Age: 62 y.o. Sex: male Date of Birth: [birth year] Admit Date: 02/04/2014 SEG:[id number] Not In System   CHIEF COMPLAINT:  Syncope  HPI: Max [name] is a 63 y.o. male with a Past Medical History of paroxysmal atrial fibrillation only on aspirin, rheumatoid arthritis, hypertension,craniotomy for meningioma removal who presents today with the above noted complaint. Per patient he was in his usual state of health, yesterday for lunch he had oysters, he had dinner at a [employer], he woke up early this morning complaining of nausea. Upon getting up from his bed he started feeling lightheadedness, went to the kitchen drank a glass of water, then went to the bathroom while coming out of the bathroom he is lightheadedness worsened and the patient sustained a syncopal episode. His spouse her the entire episode, she got to his side within a second, she did not witness any seizure like activity, tongue bite or urinary or fecal incontinence. Patient was "out" for a few minutes, upon recovering he was himself in a few minutes. He then had one episode of vomiting. Subsequently EMS was called, patient was then brought to the emergency room. In the emergency room he was noted to go in and out of atrial fibrillation with rapid ventricular response. During my evaluation, patient was awake and alert, he claims that he has been drinking excessive caffeine over the past 2 days. Denies any fever, chest pain, shortness of breath, abdominal pain, diarrhea. Apart from that one episode of vomiting he has had no further episodes of vomiting.   ALLERGIES:   Allergies  Allergen Reactions  . Vicodin [Hydrocodone-Acetaminophen] Nausea And Vomiting    PAST MEDICAL HISTORY: Past Medical History  Diagnosis Date  . RLS (restless legs syndrome)   . Allergic rhinitis   . Hypertension   . Herpes   . ED (erectile dysfunction)   . RA (rheumatoid arthritis)   .  Vasovagal syncope   . Anginal pain   . Seizures 05/14/2011    "first and only"  . Paroxysmal atrial fibrillation 07/15/2012    a) CHADSVASc score 1b) On full-dose ASA and Verapamil  . History of syncope     PAST SURGICAL HISTORY: Past Surgical History  Procedure Laterality Date  . Flexible sigmoidoscopy    . Refractive surgery  ~ 2003  . Cataract extraction w/ intraocular lens implant  ~ 2001    right  . Eye surgery  1964    right; "hit w/baseball; eye hemorrhaged"  . Brain meningioma excision  2012    MEDICATIONS AT HOME: Prior to Admission medications   Medication Sig Start Date End Date Taking? Authorizing Provider  acyclovir (ZOVIRAX) 800 MG tablet Take 800 mg by mouth daily as needed (for outbreak).   Yes Historical Provider, MD  Ascorbic Acid (VITAMIN C PO) Take 1 tablet by mouth daily.   Yes Historical Provider, MD  aspirin 81 MG tablet Take 81 mg by mouth.     Yes Historical Provider, MD  folic acid (FOLVITE) 1 MG tablet Take 1 mg by mouth daily.   Yes Historical Provider, MD  methotrexate 2.5 MG tablet Take 2.5 mg by mouth once a week. Take on Sunday   Yes Historical Provider, MD  Methylsulfonylmethane 1000 MG TABS Take 2 tablets by mouth 2 (two) times daily.   Yes Historical Provider, MD  Multiple Vitamins-Minerals (CENTRUM SILVER ULTRA MENS PO) Take 1 tablet by mouth daily.   Yes Historical Provider,  MD  omeprazole (PRILOSEC) 20 MG capsule Take 20 mg by mouth daily.   Yes Historical Provider, MD  sildenafil (VIAGRA) 100 MG tablet Take 1 tablet (100 mg total) by mouth as needed for erectile dysfunction. Use as directed 12/21/13  Yes Dorena Cookey, MD  verapamil (CALAN-SR) 180 MG CR tablet Take 180 mg by mouth daily.   Yes Historical Provider, MD    FAMILY HISTORY: Family History  Problem Relation Age of Onset  . Depression Other   . Hypertension Other   . GI problems Other   . Lung disease Other     SOCIAL HISTORY:  reports that he has never smoked. He has never  used smokeless tobacco. He reports that he does not drink alcohol or use illicit drugs.  REVIEW OF SYSTEMS:  Constitutional:   No  weight loss, night sweats,  Fevers, chills, fatigue.  HEENT:    No headaches, Difficulty swallowing,Tooth/dental problems,Sore throat,  No sneezing, itching, ear ache, nasal congestion, post nasal drip,   Cardio-vascular: No chest pain,  Orthopnea, PND, swelling in lower extremities, anasarca,  dizziness, palpitations  GI:  No heartburn, indigestion, abdominal pain,, diarrhea, change in bowel habits, loss of appetite  Resp: No shortness of breath with exertion or at rest.  No excess mucus, no productive cough, No non-productive cough,  No coughing up of blood.No change in color of mucus.No wheezing.No chest wall deformity  Skin:  no rash or lesions.  GU:  no dysuria, change in color of urine, no urgency or frequency.  No flank pain.  Musculoskeletal: No joint pain or swelling.  No decreased range of motion.  No back pain.  Psych: No change in mood or affect. No depression or anxiety.  No memory loss.   PHYSICAL EXAM: Blood pressure 139/72, pulse 78, temperature 98.4 F (36.9 C), temperature source Oral, resp. rate 18, SpO2 97.00%.  General appearance :Awake, alert, not in any distress. Speech Clear. Not toxic Looking HEENT: Atraumatic and Normocephalic, pupils equally reactive to light and accomodation Neck: supple, no JVD. No cervical lymphadenopathy.  Chest:Good air entry bilaterally, no added sounds  CVS: S1 S2 regular, no murmurs.  Abdomen: Bowel sounds present, Non tender and not distended with no gaurding, rigidity or rebound. Extremities: B/L Lower Ext shows no edema, both legs are warm to touch Neurology: Awake alert, and oriented X 3, CN II-XII intact, Non focal Skin:No Rash Wounds:N/A  LABS ON ADMISSION:   Recent Labs  02/04/14 0554  NA 143  K 3.9  CL 104  CO2 28  GLUCOSE 121*  BUN 21  CREATININE 0.92  CALCIUM 8.7    No results found for this basename: AST, ALT, ALKPHOS, BILITOT, PROT, ALBUMIN,  in the last 72 hours No results found for this basename: LIPASE, AMYLASE,  in the last 72 hours  Recent Labs  02/04/14 0554 02/04/14 1500  WBC 17.0* 11.1*  NEUTROABS 13.8*  --   HGB 15.5 14.0  HCT 45.2 40.9  MCV 97.2 97.8  PLT 333 301    Recent Labs  02/04/14 0557 02/04/14 0907  TROPONINI <0.30 <0.30   No results found for this basename: DDIMER,  in the last 72 hours No components found with this basename: POCBNP,    RADIOLOGIC STUDIES ON ADMISSION: Dg Chest 2 View  02/04/2014   CLINICAL DATA:  Fall, headache, nausea, vomiting  EXAM: CHEST  2 VIEW  COMPARISON:  07/14/2012  FINDINGS: Mild cardiac enlargement without CHF or pneumonia. Minor basilar atelectasis versus scarring. No  focal collapse or consolidation. Negative for effusion or pneumothorax. Trachea is midline. Thoracic spondylosis noted.  IMPRESSION: Basilar atelectasis versus scarring.  No acute airspace process.   Electronically Signed   By: Daryll Brod M.D.   On: 02/04/2014 08:03   Dg Thoracic Spine 4v  02/04/2014   CLINICAL DATA:  Upper back pain  EXAM: THORACIC SPINE - 4+ VIEW  COMPARISON:  07/14/2012  FINDINGS: Normal alignment. Similar pattern of diffuse thoracic degenerative changes and spondylosis. Slight increased upper thoracic kyphosis. No definite acute compression fracture or focal kyphosis. Stable appearance. Paraspinal soft tissues unremarkable. Minor basilar atelectasis versus scarring medially. Trachea is midline.  IMPRESSION: Degenerative changes. No definite acute finding by plain radiography   Electronically Signed   By: Daryll Brod M.D.   On: 02/04/2014 08:02   Ct Head Wo Contrast  02/04/2014   CLINICAL DATA:  Status post syncope and fall. Right parietal headache. Nausea and vomiting.  EXAM: CT HEAD WITHOUT CONTRAST  TECHNIQUE: Contiguous axial images were obtained from the base of the skull through the vertex without  intravenous contrast.  COMPARISON:  MR HEAD WO/W CM dated 05/23/2013; CT HEAD WO/W CM dated 07/14/2011  FINDINGS: There is no evidence of acute infarction, mass lesion, or intra- or extra-axial hemorrhage on CT.  Postoperative change is noted at the right vertex, with associated chronic encephalomalacia.  The posterior fossa, including the cerebellum, brainstem and fourth ventricle, is within normal limits. The third and lateral ventricles, and basal ganglia are unremarkable in appearance. The cerebral hemispheres are symmetric in appearance, with normal gray-white differentiation. No mass effect or midline shift is seen.  There is no evidence of fracture. The orbits are within normal limits. Mucosal thickening is noted at the left maxillary sinus. The remaining paranasal sinuses and mastoid air cells are well-aerated. No significant soft tissue abnormalities are seen.  IMPRESSION: 1. No evidence of traumatic intracranial injury or fracture. 2. Postoperative change at the right vertex, with associated chronic encephalomalacia. 3. Mucosal thickening at the left maxillary sinus.   Electronically Signed   By: Garald Balding M.D.   On: 02/04/2014 07:08     EKG: Independently reviewed. Normal sinus rhythm. Second EKG shows atrial fibrillation with rapid ventricular response  ASSESSMENT AND PLAN: Present on Admission:  . Syncope - Suspect secondary to orthostatic mechanism. Initial orthostatics showed a 14 point drop in his systolic blood pressure on standing - Will admit to telemetry, hydrate, cycle cardiac enzymes. Check a 2-D echocardiogram. Given his history of craniotomy for a meningioma removal-could have had seizures, however no seizures was witnessed and is felt to be unlikely, however would check an EEG   . Paroxysmal atrial fibrillation - Did have brief episodes of A. fib with RVR in the emergency room. I reviewed prior cardiology notes from 2013, patient was noted to have paroxysmal atrial  fibrillation that were exacerbated by large amounts of caffeine intake. Patient and his spouse claims that for the past 1 week patient has had a lot of caffeine intake because of the cold weather.His CHADSVASc score is 1 (HTN) therefore would continue with full-dose ASA. Did discuss both patient and wife regarding anticoagulation, they have declined at this point in time. They're aware of thromboembolic manifestations including CVA. - Will monitor in telemetry and continue the verapamil.  . Leukocytosis -? Secondary to gastroenteritis. Patient did have one episode of vomiting. However is afebrile, nontoxic looking, no foci evident. Urinalysis and chest x-ray remains unremarkable. Continue to monitor off antibiotics.  Marland Kitchen  One episode of vomiting - Patient did have one episode of vomiting this morning, currently seems to be stable, he has had no further recurrence of his symptoms. Abdomen is soft, and nondistended. We'll continue to monitor him and provide upportive care. If vomiting persistently, will commence further workup.  Marland Kitchen HYPERTENSION - With slight orthostatics this morning. Cautiously hydrate and continue with verapamil for rate control   . Rheumatoid arthritis - Hold methotrexate for now, resume when able.  Further plan will depend as patient's clinical course evolves and further radiologic and laboratory data become available. Patient will be monitored closely.   Above noted plan was discussed with patient/family , they were in agreement.   DVT Prophylaxis: Prophylactic Lovenox   Code Status: Full Code  Total time spent for admission equals 45 minutes.  [facility] Pager [phone]  If 7PM-7AM, please contact night-coverage [email or web address] Password [facility] 02/04/2014, 3:24 PM

## 2014-02-04 NOTE — ED Notes (Signed)
Here by EMS, alert, NAD, calm, interactive, resps e/u, speaking in clear complete sentences.  Here from home s/p syncope with fall, wife in next room heard fall, pt denies injury or pain, mentions R parietal HA. Pt felt nausea at home, got up to go to the b/r to get water, collapsed and fell, wife heard fall and went to check on him, pt was unresponsive, pt gradually arousable to alert & interactive. Vomited x1 for EMS PTA, cbg 140 (no h/o DM), pt had no memory of falling in kitchen, "EKG normal, stroke screen negative". zofran given PTA. Denies any obvious fluid loss, sick contacts or illness. H/o sz x1 3 yrs ago, after brain tumor removed. Pt was not orthostatic for EMS.

## 2014-02-04 NOTE — ED Notes (Signed)
Pt did not tolerate orthostatic VS well. Pt stated that he felt weak. He could not stand the whole three minutes. Pt stated that he felt better after he laid back down.

## 2014-02-04 NOTE — ED Notes (Signed)
No changes, VSS, pt to CT. wife remains at [facility].

## 2014-02-04 NOTE — ED Provider Notes (Signed)
Nausea, lightheaded, syncope after [place] food.  W/u about 1.5 yrs ago for syncope. Now h/a, pain between shoulder blades, reproducible.  CXR, unremarkable, trop negative x2.  Pt was found to have several episodes of paroxysmal afib.  Given this arrythmia, I feel pt requires observation as cardiac syncope now appears to be a more likely possibility of cause of syncope.  [facility] will admit.   1. Syncope   2. Paroxysmal atrial fibrillation   3. Rheumatoid arthritis(714.0)   4. Unspecified essential hypertension      Neta Ehlers, MD 02/04/14 2115

## 2014-02-05 LAB — CBC
HCT: 39.8 % (ref 39.0–52.0)
Hemoglobin: 13.4 g/dL (ref 13.0–17.0)
MCH: 33.2 pg (ref 26.0–34.0)
MCHC: 33.7 g/dL (ref 30.0–36.0)
MCV: 98.5 fL (ref 78.0–100.0)
PLATELETS: 277 10*3/uL (ref 150–400)
RBC: 4.04 MIL/uL — ABNORMAL LOW (ref 4.22–5.81)
RDW: 13.4 % (ref 11.5–15.5)
WBC: 9.1 10*3/uL (ref 4.0–10.5)

## 2014-02-05 LAB — BASIC METABOLIC PANEL
BUN: 15 mg/dL (ref 6–23)
CALCIUM: 8.4 mg/dL (ref 8.4–10.5)
CO2: 28 mEq/L (ref 19–32)
Chloride: 107 mEq/L (ref 96–112)
Creatinine, Ser: 0.94 mg/dL (ref 0.50–1.35)
GFR calc Af Amer: >90 mL/min (ref >90)
GFR, EST NON AFRICAN AMERICAN: 87 mL/min — AB (ref >90)
GLUCOSE: 107 mg/dL — AB (ref 70–99)
Potassium: 4.4 mEq/L (ref 3.7–5.3)
Sodium: 142 mEq/L (ref 137–147)

## 2014-02-05 LAB — TSH: TSH: 1.145 u[IU]/mL (ref 0.350–4.500)

## 2014-02-05 LAB — TROPONIN I

## 2014-02-05 NOTE — Progress Notes (Signed)
Utilization Review Completed.   Sotero Brinkmeyer, RN, BSN Nurse Case Manager  

## 2014-02-05 NOTE — Discharge Summary (Signed)
Physician Discharge Summary  Max [name] HAL:[id number] DOB: 08-27-1951 DOA: 02/04/2014  PCP: Joycelyn Man, MD  Admit date: 02/04/2014 Discharge date: 02/05/2014  Time spent: 40 minutes  Recommendations for Outpatient Follow-up:  1. Followup with primary care physician within 2 weeks  Discharge Diagnoses:  Principal Problem:   Syncope Active Problems:   HYPERTENSION   Rheumatoid arthritis(714.0)   Paroxysmal atrial fibrillation   Discharge Condition: Stable  Diet recommendation: Heart healthy diet, avoid caffeine containing drinks  There were no vitals filed for this visit.  History of present illness:  Max [name] is a 63 y.o. male with a Past Medical History of paroxysmal atrial fibrillation only on aspirin, rheumatoid arthritis, hypertension,craniotomy for meningioma removal who presents today with the above noted complaint. Per patient he was in his usual state of health, yesterday for lunch he had oysters, he had dinner at a [employer], he woke up early this morning complaining of nausea. Upon getting up from his bed he started feeling lightheadedness, went to the kitchen drank a glass of water, then went to the bathroom while coming out of the bathroom he is lightheadedness worsened and the patient sustained a syncopal episode. His spouse her the entire episode, she got to his side within a second, she did not witness any seizure like activity, tongue bite or urinary or fecal incontinence. Patient was "out" for a few minutes, upon recovering he was himself in a few minutes. He then had one episode of vomiting. Subsequently EMS was called, patient was then brought to the emergency room. In the emergency room he was noted to go in and out of atrial fibrillation with rapid ventricular response. During my evaluation, patient was awake and alert, he claims that he has been drinking excessive caffeine over the past 2 days. Denies any fever, chest pain, shortness of  breath, abdominal pain, diarrhea. Apart from that one episode of vomiting he has had no further episodes of vomiting.  Hospital Course:   1. Syncope: Syncope and collapse, his history is very typical for vasovagal attack exacerbated the dehydration he already having. Patient started to have nausea and vomiting, then went to the bathroom had a bowel movement and when he tried to get up he collapsed. He hit his head, CT scan of the head was negative for intracranial abnormalities. Patient hydrated with IV fluids. 2-D echo and EEG ordered by discontinued those because of the typical presentation. Patient stood up today without any problems after the IV fluid hydration.  2. Paroxysmal atrial fibrillation: Patient did have a brief episode of atrial fibrillation with RVR in the emergency department, patient known to have paroxysmal A. fib usually get exacerbated with large amount of caffeine intake, I think caffeine and dehydration played a role in your. Patient back to normal sinus rhythm. He was to continue full dose aspirin for now which is reasonable, his CHADS2 score is on for hypertension. Patient asked to avoid caffeinated drinks, patient and his wife at bedside who voiced understanding.  3. Gastroenteritis: Patient ate out, had oysters and in the evening ate [place], patient developed nausea, vomiting. Patient also developed leukocytosis which is likely secondary to gastroenteritis, this resolved overnight.  4. Hypertension: Patient with orthostatic yesterday, the goal is continued patient was hydrated. Blood pressure is controlled. Patient for discharge  Procedures:  None  Consultations:  None  Discharge Exam: Filed Vitals:   02/05/14 0522  BP: 122/74  Pulse: 69  Temp: 98.8 F (37.1 C)  Resp: 18  General: Alert and awake, oriented x3, not in any acute distress. HEENT: anicteric sclera, pupils reactive to light and accommodation, EOMI CVS: S1-S2 clear, no murmur rubs or  gallops Chest: clear to auscultation bilaterally, no wheezing, rales or rhonchi Abdomen: soft nontender, nondistended, normal bowel sounds, no organomegaly Extremities: no cyanosis, clubbing or edema noted bilaterally Neuro: Cranial nerves II-XII intact, no focal neurological deficits  Discharge Instructions  Discharge Orders   Future Orders Complete By Expires   Diet - low sodium heart healthy  As directed    Increase activity slowly  As directed        Medication List         acyclovir 800 MG tablet  Commonly known as:  ZOVIRAX  Take 800 mg by mouth daily as needed (for outbreak).     aspirin 81 MG tablet  Take 81 mg by mouth.     CENTRUM SILVER ULTRA MENS PO  Take 1 tablet by mouth daily.     folic acid 1 MG tablet  Commonly known as:  FOLVITE  Take 1 mg by mouth daily.     methotrexate 2.5 MG tablet  Take 2.5 mg by mouth once a week. Take on Sunday     Methylsulfonylmethane 1000 MG Tabs  Take 2 tablets by mouth 2 (two) times daily.     omeprazole 20 MG capsule  Commonly known as:  PRILOSEC  Take 20 mg by mouth daily.     sildenafil 100 MG tablet  Commonly known as:  VIAGRA  Take 1 tablet (100 mg total) by mouth as needed for erectile dysfunction. Use as directed     verapamil 180 MG CR tablet  Commonly known as:  CALAN-SR  Take 180 mg by mouth daily.     VITAMIN C PO  Take 1 tablet by mouth daily.       Allergies  Allergen Reactions  . Vicodin [Hydrocodone-Acetaminophen] Nausea And Vomiting      The results of significant diagnostics from this hospitalization (including imaging, microbiology, ancillary and laboratory) are listed below for reference.    Significant Diagnostic Studies: Dg Chest 2 View  02/04/2014   CLINICAL DATA:  Fall, headache, nausea, vomiting  EXAM: CHEST  2 VIEW  COMPARISON:  07/14/2012  FINDINGS: Mild cardiac enlargement without CHF or pneumonia. Minor basilar atelectasis versus scarring. No focal collapse or consolidation.  Negative for effusion or pneumothorax. Trachea is midline. Thoracic spondylosis noted.  IMPRESSION: Basilar atelectasis versus scarring.  No acute airspace process.   Electronically Signed   By: Daryll Brod M.D.   On: 02/04/2014 08:03   Dg Thoracic Spine 4v  02/04/2014   CLINICAL DATA:  Upper back pain  EXAM: THORACIC SPINE - 4+ VIEW  COMPARISON:  07/14/2012  FINDINGS: Normal alignment. Similar pattern of diffuse thoracic degenerative changes and spondylosis. Slight increased upper thoracic kyphosis. No definite acute compression fracture or focal kyphosis. Stable appearance. Paraspinal soft tissues unremarkable. Minor basilar atelectasis versus scarring medially. Trachea is midline.  IMPRESSION: Degenerative changes. No definite acute finding by plain radiography   Electronically Signed   By: Daryll Brod M.D.   On: 02/04/2014 08:02   Ct Head Wo Contrast  02/04/2014   CLINICAL DATA:  Status post syncope and fall. Right parietal headache. Nausea and vomiting.  EXAM: CT HEAD WITHOUT CONTRAST  TECHNIQUE: Contiguous axial images were obtained from the base of the skull through the vertex without intravenous contrast.  COMPARISON:  MR HEAD WO/W CM dated 05/23/2013; CT HEAD  WO/W CM dated 07/14/2011  FINDINGS: There is no evidence of acute infarction, mass lesion, or intra- or extra-axial hemorrhage on CT.  Postoperative change is noted at the right vertex, with associated chronic encephalomalacia.  The posterior fossa, including the cerebellum, brainstem and fourth ventricle, is within normal limits. The third and lateral ventricles, and basal ganglia are unremarkable in appearance. The cerebral hemispheres are symmetric in appearance, with normal gray-white differentiation. No mass effect or midline shift is seen.  There is no evidence of fracture. The orbits are within normal limits. Mucosal thickening is noted at the left maxillary sinus. The remaining paranasal sinuses and mastoid air cells are well-aerated. No  significant soft tissue abnormalities are seen.  IMPRESSION: 1. No evidence of traumatic intracranial injury or fracture. 2. Postoperative change at the right vertex, with associated chronic encephalomalacia. 3. Mucosal thickening at the left maxillary sinus.   Electronically Signed   By: Garald Balding M.D.   On: 02/04/2014 07:08    Microbiology: No results found for this or any previous visit (from the past 240 hour(s)).   Labs: Basic Metabolic Panel:  Recent Labs Lab 02/04/14 0554 02/04/14 1500 02/05/14 0125  NA 143  --  142  K 3.9  --  4.4  CL 104  --  107  CO2 28  --  28  GLUCOSE 121*  --  107*  BUN 21  --  15  CREATININE 0.92 0.88 0.94  CALCIUM 8.7  --  8.4   Liver Function Tests: No results found for this basename: AST, ALT, ALKPHOS, BILITOT, PROT, ALBUMIN,  in the last 168 hours No results found for this basename: LIPASE, AMYLASE,  in the last 168 hours No results found for this basename: AMMONIA,  in the last 168 hours CBC:  Recent Labs Lab 02/04/14 0554 02/04/14 1500 02/05/14 0125  WBC 17.0* 11.1* 9.1  NEUTROABS 13.8*  --   --   HGB 15.5 14.0 13.4  HCT 45.2 40.9 39.8  MCV 97.2 97.8 98.5  PLT 333 301 277   Cardiac Enzymes:  Recent Labs Lab 02/04/14 0557 02/04/14 0907 02/04/14 1500 02/04/14 1946 02/05/14 0125  TROPONINI <0.30 <0.30 <0.30 <0.30 <0.30   BNP: BNP (last 3 results) No results found for this basename: PROBNP,  in the last 8760 hours CBG: No results found for this basename: GLUCAP,  in the last 168 hours     Signed:  Rosalia Mcavoy A  [facility] 02/05/2014, 10:31 AM

## 2014-04-28 ENCOUNTER — Telehealth: Payer: Self-pay | Admitting: Family Medicine

## 2014-04-28 NOTE — Telephone Encounter (Signed)
Pt sees dr Joya Salm once a year for an ongoing brain stem issue. Pt is sure they will now need referral for this appt would like to get the referral prior to appt. Pt has not made appt yet pls advise.

## 2014-10-24 ENCOUNTER — Other Ambulatory Visit: Payer: Self-pay | Admitting: Family Medicine

## 2014-12-04 ENCOUNTER — Other Ambulatory Visit: Payer: Self-pay | Admitting: Family Medicine

## 2014-12-15 ENCOUNTER — Other Ambulatory Visit: Payer: Self-pay | Admitting: Family Medicine

## 2014-12-18 ENCOUNTER — Telehealth: Payer: Self-pay | Admitting: Family Medicine

## 2014-12-18 MED ORDER — METHOTREXATE 2.5 MG PO TABS: ORAL_TABLET | ORAL | Status: DC

## 2014-12-18 MED ORDER — VERAPAMIL HCL ER 180 MG PO TBCR
180.0000 mg | EXTENDED_RELEASE_TABLET | Freq: Every day | ORAL | Status: DC
Start: 2014-12-18 — End: 2015-02-08

## 2014-12-18 NOTE — Telephone Encounter (Signed)
Pt's cpx appt was rescheduled to 02/08/15 due to PCP being out of the office on 01/02/15.  Pt requesting refill of verapamil (CALAN-SR) 180 MG CR tablet and methotrexate 2.5 MG tablet to as he will run out before appt in February.

## 2014-12-18 NOTE — Telephone Encounter (Signed)
rx sent

## 2014-12-21 ENCOUNTER — Other Ambulatory Visit (INDEPENDENT_AMBULATORY_CARE_PROVIDER_SITE_OTHER): Payer: BLUE CROSS/BLUE SHIELD

## 2014-12-21 DIAGNOSIS — Z0000 Encounter for general adult medical examination without abnormal findings: Secondary | ICD-10-CM

## 2014-12-21 LAB — BASIC METABOLIC PANEL
BUN: 20 mg/dL (ref 6–23)
CALCIUM: 9.2 mg/dL (ref 8.4–10.5)
CHLORIDE: 103 meq/L (ref 96–112)
CO2: 27 mEq/L (ref 19–32)
Creatinine, Ser: 1 mg/dL (ref 0.4–1.5)
GFR: 81.77 mL/min (ref >60.00)
GLUCOSE: 95 mg/dL (ref 70–99)
Potassium: 4.4 mEq/L (ref 3.5–5.1)
Sodium: 138 mEq/L (ref 135–145)

## 2014-12-21 LAB — POCT URINALYSIS DIPSTICK
BILIRUBIN UA: NEGATIVE
GLUCOSE UA: NEGATIVE
Ketones, UA: NEGATIVE
Leukocytes, UA: NEGATIVE
Nitrite, UA: NEGATIVE
Protein, UA: NEGATIVE
RBC UA: NEGATIVE
SPEC GRAV UA: 1.01
Urobilinogen, UA: 0.2
pH, UA: 6

## 2014-12-21 LAB — CBC WITH DIFFERENTIAL/PLATELET
BASOS ABS: 0 10*3/uL (ref 0.0–0.1)
BASOS PCT: 0.9 % (ref 0.0–3.0)
EOS PCT: 1.7 % (ref 0.0–5.0)
Eosinophils Absolute: 0.1 10*3/uL (ref 0.0–0.7)
HCT: 45.2 % (ref 39.0–52.0)
Hemoglobin: 15 g/dL (ref 13.0–17.0)
Lymphocytes Relative: 37.1 % (ref 12.0–46.0)
Lymphs Abs: 2.1 10*3/uL (ref 0.7–4.0)
MCHC: 33.1 g/dL (ref 30.0–36.0)
MCV: 100.4 fl — ABNORMAL HIGH (ref 78.0–100.0)
Monocytes Absolute: 0.4 10*3/uL (ref 0.1–1.0)
Monocytes Relative: 7.6 % (ref 3.0–12.0)
NEUTROS ABS: 3 10*3/uL (ref 1.4–7.7)
NEUTROS PCT: 52.7 % (ref 43.0–77.0)
Platelets: 373 10*3/uL (ref 150.0–400.0)
RBC: 4.5 Mil/uL (ref 4.22–5.81)
RDW: 14.1 % (ref 11.5–15.5)
WBC: 5.7 10*3/uL (ref 4.0–10.5)

## 2014-12-21 LAB — HEPATIC FUNCTION PANEL
ALT: 26 U/L (ref 0–53)
AST: 23 U/L (ref 0–37)
Albumin: 4.2 g/dL (ref 3.5–5.2)
Alkaline Phosphatase: 82 U/L (ref 39–117)
BILIRUBIN DIRECT: 0.1 mg/dL (ref 0.0–0.3)
Total Bilirubin: 0.9 mg/dL (ref 0.2–1.2)
Total Protein: 7.1 g/dL (ref 6.0–8.3)

## 2014-12-21 LAB — PSA: PSA: 1.26 ng/mL (ref 0.10–4.00)

## 2014-12-21 LAB — LIPID PANEL
CHOL/HDL RATIO: 4
Cholesterol: 216 mg/dL — ABNORMAL HIGH (ref 0–200)
HDL: 49.6 mg/dL (ref >39.00)
LDL Cholesterol: 156 mg/dL — ABNORMAL HIGH (ref 0–99)
NonHDL: 166.4
Triglycerides: 50 mg/dL (ref 0.0–149.0)
VLDL: 10 mg/dL (ref 0.0–40.0)

## 2014-12-21 LAB — TSH: TSH: 3.73 u[IU]/mL (ref 0.35–4.50)

## 2015-01-02 ENCOUNTER — Encounter: Payer: 59 | Admitting: Family Medicine

## 2015-02-08 ENCOUNTER — Ambulatory Visit (INDEPENDENT_AMBULATORY_CARE_PROVIDER_SITE_OTHER): Payer: BLUE CROSS/BLUE SHIELD | Admitting: Family Medicine

## 2015-02-08 ENCOUNTER — Encounter: Payer: Self-pay | Admitting: Family Medicine

## 2015-02-08 VITALS — BP 158/80 | Temp 97.9°F | Ht 72.0 in | Wt 204.0 lb

## 2015-02-08 DIAGNOSIS — I10 Essential (primary) hypertension: Secondary | ICD-10-CM

## 2015-02-08 DIAGNOSIS — N528 Other male erectile dysfunction: Secondary | ICD-10-CM

## 2015-02-08 DIAGNOSIS — M069 Rheumatoid arthritis, unspecified: Secondary | ICD-10-CM

## 2015-02-08 DIAGNOSIS — Z23 Encounter for immunization: Secondary | ICD-10-CM

## 2015-02-08 DIAGNOSIS — B009 Herpesviral infection, unspecified: Secondary | ICD-10-CM

## 2015-02-08 DIAGNOSIS — N529 Male erectile dysfunction, unspecified: Secondary | ICD-10-CM

## 2015-02-08 MED ORDER — METHOTREXATE 2.5 MG PO TABS: ORAL_TABLET | ORAL | Status: DC

## 2015-02-08 MED ORDER — OMEPRAZOLE 20 MG PO CPDR: 20.0000 mg | DELAYED_RELEASE_CAPSULE | Freq: Every day | ORAL | Status: DC

## 2015-02-08 MED ORDER — SILDENAFIL CITRATE 100 MG PO TABS: 100.0000 mg | ORAL_TABLET | ORAL | Status: DC | PRN

## 2015-02-08 MED ORDER — ACYCLOVIR 800 MG PO TABS: 800.0000 mg | ORAL_TABLET | Freq: Three times a day (TID) | ORAL | Status: DC

## 2015-02-08 MED ORDER — VERAPAMIL HCL ER 180 MG PO TBCR: 180.0000 mg | EXTENDED_RELEASE_TABLET | Freq: Every day | ORAL | Status: DC

## 2015-02-08 NOTE — Progress Notes (Signed)
Pre visit review using our clinic review tool, if applicable. No additional management support is needed unless otherwise documented below in the visit note. 

## 2015-02-08 NOTE — Patient Instructions (Signed)
Continue current medications  Follow-up in 3 months for nonfasting CBC  Return in one year for general physical examination  Start a walking program 30 minutes daily at noon

## 2015-02-08 NOTE — Progress Notes (Signed)
   Subjective:    Patient ID: Max [name], male    DOB: 1951/01/30, 64 y.o.   MRN: [id number]  HPI Max [name] is a 64 year old married male nonsmoker who comes in today for general physical examination because of a history of rheumatoid arthritis, reflux esophagitis, hypertension, and erectile dysfunction  Med list reviewed it is correct. He says he feels well and doing well and no complaints. He takes 10 mg of methotrexate weekly for rheumatoid arthritis and doing well.  He gets routine eye care. He's had the right cataract removed. Left cataract not significant this juncture. He gets regular dental care, colonoscopy 12 years ago. Advised to call GI and get set up for follow-up colonoscopy  Cognitive function normal he does not exercise on a regular basis home health safety reviewed no issues identified, no guns in the house, he does have a healthcare power of attorney and living well.  Vaccinations up-to-date   Review of Systems  Constitutional: Negative.   HENT: Negative.   Eyes: Negative.   Respiratory: Negative.   Cardiovascular: Negative.   Gastrointestinal: Negative.   Endocrine: Negative.   Genitourinary: Negative.   Musculoskeletal: Negative.   Skin: Negative.   Allergic/Immunologic: Negative.   Neurological: Negative.   Hematological: Negative.   Psychiatric/Behavioral: Negative.        Objective:   Physical Exam  Constitutional: He is oriented to person, place, and time. He appears well-developed and well-nourished.  HENT:  Head: Normocephalic and atraumatic.  Right Ear: External ear normal.  Left Ear: External ear normal.  Nose: Nose normal.  Mouth/Throat: Oropharynx is clear and moist.  Teardrop right pupil from previous trauma when he was 64 years old. He got hit in the eye with a baseball  Eyes: Conjunctivae and EOM are normal. Pupils are equal, round, and reactive to light.  Neck: Normal range of motion. Neck supple. No JVD present. No tracheal deviation  present. No thyromegaly present.  Cardiovascular: Normal rate, regular rhythm, normal heart sounds and intact distal pulses.  Exam reveals no gallop and no friction rub.   No murmur heard. No carotid neurologic bruits peripheral pulses 2+ and symmetrical  Pulmonary/Chest: Effort normal and breath sounds normal. No stridor. No respiratory distress. He has no wheezes. He has no rales. He exhibits no tenderness.  Abdominal: Soft. Bowel sounds are normal. He exhibits no distension and no mass. There is no tenderness. There is no rebound and no guarding.  Genitourinary: Rectum normal, prostate normal and penis normal. Guaiac negative stool. No penile tenderness.  Musculoskeletal: Normal range of motion. He exhibits no edema or tenderness.  Lymphadenopathy:    He has no cervical adenopathy.  Neurological: He is alert and oriented to person, place, and time. He has normal reflexes. No cranial nerve deficit. He exhibits normal muscle tone.  Skin: Skin is warm and dry. No rash noted. No erythema. No pallor.  Total body skin exam normal  Psychiatric: He has a normal mood and affect. His behavior is normal. Judgment and thought content normal.  Nursing note and vitals reviewed.         Assessment & Plan:  Healthy male  Hypertension at goal  History of PAF,,,,,,,,,, asymptomatic  Hypertension ago continue verapamil  Mild ED,,,,, Route Viagra when necessary  History of HSV-1,,,,,,, Zovirax when necessary  Reflux esophagitis continue Prilosec

## 2015-02-09 ENCOUNTER — Telehealth: Payer: Self-pay | Admitting: Family Medicine

## 2015-02-09 NOTE — Telephone Encounter (Signed)
emmi emailed °

## 2015-11-01 DIAGNOSIS — H2512 Age-related nuclear cataract, left eye: Secondary | ICD-10-CM | POA: Diagnosis not present

## 2015-11-01 DIAGNOSIS — H35362 Drusen (degenerative) of macula, left eye: Secondary | ICD-10-CM | POA: Diagnosis not present

## 2015-11-01 DIAGNOSIS — H25012 Cortical age-related cataract, left eye: Secondary | ICD-10-CM | POA: Diagnosis not present

## 2015-11-01 DIAGNOSIS — H02833 Dermatochalasis of right eye, unspecified eyelid: Secondary | ICD-10-CM | POA: Diagnosis not present

## 2015-11-01 DIAGNOSIS — Z961 Presence of intraocular lens: Secondary | ICD-10-CM | POA: Diagnosis not present

## 2015-11-01 DIAGNOSIS — H35361 Drusen (degenerative) of macula, right eye: Secondary | ICD-10-CM | POA: Diagnosis not present

## 2015-11-28 ENCOUNTER — Encounter: Payer: Self-pay | Admitting: Family Medicine

## 2015-11-28 ENCOUNTER — Ambulatory Visit (INDEPENDENT_AMBULATORY_CARE_PROVIDER_SITE_OTHER): Payer: Medicare Other | Admitting: Family Medicine

## 2015-11-28 VITALS — BP 140/90 | Temp 98.3°F | Wt 204.0 lb

## 2015-11-28 DIAGNOSIS — I10 Essential (primary) hypertension: Secondary | ICD-10-CM

## 2015-11-28 DIAGNOSIS — B009 Herpesviral infection, unspecified: Secondary | ICD-10-CM

## 2015-11-28 DIAGNOSIS — M545 Low back pain, unspecified: Secondary | ICD-10-CM

## 2015-11-28 DIAGNOSIS — K469 Unspecified abdominal hernia without obstruction or gangrene: Secondary | ICD-10-CM | POA: Diagnosis not present

## 2015-11-28 DIAGNOSIS — Z23 Encounter for immunization: Secondary | ICD-10-CM | POA: Diagnosis not present

## 2015-11-28 DIAGNOSIS — N529 Male erectile dysfunction, unspecified: Secondary | ICD-10-CM | POA: Diagnosis not present

## 2015-11-28 DIAGNOSIS — M549 Dorsalgia, unspecified: Secondary | ICD-10-CM | POA: Insufficient documentation

## 2015-11-28 HISTORY — DX: Unspecified abdominal hernia without obstruction or gangrene: K46.9

## 2015-11-28 MED ORDER — VERAPAMIL HCL ER 180 MG PO TBCR: 180.0000 mg | EXTENDED_RELEASE_TABLET | Freq: Every day | ORAL | Status: DC

## 2015-11-28 MED ORDER — ACYCLOVIR 800 MG PO TABS: 800.0000 mg | ORAL_TABLET | Freq: Three times a day (TID) | ORAL | Status: DC

## 2015-11-28 MED ORDER — SILDENAFIL CITRATE 100 MG PO TABS: 100.0000 mg | ORAL_TABLET | ORAL | Status: DC | PRN

## 2015-11-28 MED ORDER — OMEPRAZOLE 20 MG PO CPDR: 20.0000 mg | DELAYED_RELEASE_CAPSULE | Freq: Every day | ORAL | Status: DC

## 2015-11-28 NOTE — Progress Notes (Signed)
Pre visit review using our clinic review tool, if applicable. No additional management support is needed unless otherwise documented below in the visit note. 

## 2015-11-28 NOTE — Patient Instructions (Signed)
Call Dr. Serita Grammes at the surgery center for consultation

## 2015-11-28 NOTE — Progress Notes (Signed)
   Subjective:    Patient ID: Max [name], male    DOB: December 02, 1951, 64 y.o.   MRN: [id number]  HPI [name] is a 64 year old married male known smoker comes in today for evaluation of possible hernia  Said he was stooping over last Thursday at work and felt a pain in his right lower quadrant. He then saw a bulge. The bulges since disappeared.  He's also noted right-sided back pain for about 8-9 months now. It bothers him when he lies down. He's able to walk without pain. He has no neurologic symptoms   Review of Systems Review of systems negative    Objective:   Physical Exam   Well-developed well-nourished male no acute distress vital signs stable is afebrile examination of groin shows no hernia in the groin. In the supine position I can feel a small defect in the right lower quadrant.     Assessment & Plan:  Possible right lower quadrant abdominal wall hernia.......... consult with Dr. Donne Hazel  Chronic low back pain,,,,,,, PT consult

## 2015-11-29 ENCOUNTER — Other Ambulatory Visit: Payer: Self-pay | Admitting: Family Medicine

## 2015-11-29 DIAGNOSIS — K469 Unspecified abdominal hernia without obstruction or gangrene: Secondary | ICD-10-CM

## 2015-12-06 DIAGNOSIS — M5126 Other intervertebral disc displacement, lumbar region: Secondary | ICD-10-CM | POA: Diagnosis not present

## 2015-12-06 DIAGNOSIS — M545 Low back pain: Secondary | ICD-10-CM | POA: Diagnosis not present

## 2015-12-11 DIAGNOSIS — M5126 Other intervertebral disc displacement, lumbar region: Secondary | ICD-10-CM | POA: Diagnosis not present

## 2015-12-11 DIAGNOSIS — M545 Low back pain: Secondary | ICD-10-CM | POA: Diagnosis not present

## 2015-12-13 DIAGNOSIS — M545 Low back pain: Secondary | ICD-10-CM | POA: Diagnosis not present

## 2015-12-13 DIAGNOSIS — M5126 Other intervertebral disc displacement, lumbar region: Secondary | ICD-10-CM | POA: Diagnosis not present

## 2015-12-14 ENCOUNTER — Telehealth: Payer: Self-pay | Admitting: Family Medicine

## 2015-12-14 DIAGNOSIS — M545 Low back pain: Secondary | ICD-10-CM

## 2015-12-14 NOTE — Telephone Encounter (Signed)
Spoke with wife and she would like her husband to go to ortho.  Referral placed.

## 2015-12-14 NOTE — Telephone Encounter (Signed)
Candyce Churn call from [employer] she said she has been seeing the pt for about 2 weeks for low back pain.She said she thinks there is something else going on with his back. Pt is not responding to physical therapy. Wondering if there is any other diagnostics test that may need to be done.   [phone]

## 2015-12-23 ENCOUNTER — Other Ambulatory Visit: Payer: Self-pay | Admitting: Family Medicine

## 2015-12-26 ENCOUNTER — Other Ambulatory Visit: Payer: Self-pay | Admitting: General Surgery

## 2015-12-26 DIAGNOSIS — K439 Ventral hernia without obstruction or gangrene: Secondary | ICD-10-CM | POA: Diagnosis not present

## 2015-12-26 DIAGNOSIS — K469 Unspecified abdominal hernia without obstruction or gangrene: Secondary | ICD-10-CM

## 2015-12-27 DIAGNOSIS — M546 Pain in thoracic spine: Secondary | ICD-10-CM | POA: Diagnosis not present

## 2016-01-03 ENCOUNTER — Other Ambulatory Visit: Payer: Medicare Other

## 2016-01-03 DIAGNOSIS — M546 Pain in thoracic spine: Secondary | ICD-10-CM | POA: Diagnosis not present

## 2016-01-08 ENCOUNTER — Ambulatory Visit
Admission: RE | Admit: 2016-01-08 | Discharge: 2016-01-08 | Disposition: A | Discharge status: Home / Self Care | Payer: Medicare Other | Source: Ambulatory Visit | Attending: General Surgery | Admitting: General Surgery

## 2016-01-08 DIAGNOSIS — R1031 Right lower quadrant pain: Secondary | ICD-10-CM | POA: Diagnosis not present

## 2016-01-08 DIAGNOSIS — K469 Unspecified abdominal hernia without obstruction or gangrene: Secondary | ICD-10-CM

## 2016-01-08 MED ORDER — IOPAMIDOL (ISOVUE-300) INJECTION 61%
125.0000 mL | Freq: Once | INTRAVENOUS | Status: AC | PRN
  Administered 2016-01-08: 125 mL via INTRAVENOUS

## 2016-01-15 ENCOUNTER — Telehealth: Payer: Self-pay | Admitting: *Deleted

## 2016-01-15 NOTE — Telephone Encounter (Signed)
Wife is calling because test results shown a cyst on his right kidney and a nodule on his right lung.  Please advise. Ct with contrast and MRI without were done. [phone]  Per Dr Sherren Mocha patient should drop off results and he will review and advise.

## 2016-01-16 ENCOUNTER — Other Ambulatory Visit: Payer: Self-pay | Admitting: Family Medicine

## 2016-01-16 DIAGNOSIS — I10 Essential (primary) hypertension: Secondary | ICD-10-CM

## 2016-01-16 DIAGNOSIS — N529 Male erectile dysfunction, unspecified: Secondary | ICD-10-CM

## 2016-01-16 DIAGNOSIS — R911 Solitary pulmonary nodule: Secondary | ICD-10-CM

## 2016-01-16 DIAGNOSIS — N281 Cyst of kidney, acquired: Secondary | ICD-10-CM

## 2016-01-16 DIAGNOSIS — R351 Nocturia: Secondary | ICD-10-CM

## 2016-01-17 ENCOUNTER — Ambulatory Visit (INDEPENDENT_AMBULATORY_CARE_PROVIDER_SITE_OTHER)
Admission: RE | Admit: 2016-01-17 | Discharge: 2016-01-17 | Disposition: A | Discharge status: Home / Self Care | Payer: Medicare Other | Source: Ambulatory Visit | Attending: Family Medicine | Admitting: Family Medicine

## 2016-01-17 ENCOUNTER — Other Ambulatory Visit (INDEPENDENT_AMBULATORY_CARE_PROVIDER_SITE_OTHER): Payer: Medicare Other

## 2016-01-17 DIAGNOSIS — R351 Nocturia: Secondary | ICD-10-CM | POA: Diagnosis not present

## 2016-01-17 DIAGNOSIS — R079 Chest pain, unspecified: Secondary | ICD-10-CM | POA: Diagnosis not present

## 2016-01-17 DIAGNOSIS — N529 Male erectile dysfunction, unspecified: Secondary | ICD-10-CM | POA: Diagnosis not present

## 2016-01-17 DIAGNOSIS — Q6100 Congenital renal cyst, unspecified: Secondary | ICD-10-CM

## 2016-01-17 DIAGNOSIS — I10 Essential (primary) hypertension: Secondary | ICD-10-CM

## 2016-01-17 DIAGNOSIS — R911 Solitary pulmonary nodule: Secondary | ICD-10-CM | POA: Diagnosis not present

## 2016-01-17 DIAGNOSIS — N281 Cyst of kidney, acquired: Secondary | ICD-10-CM

## 2016-01-17 LAB — BASIC METABOLIC PANEL
BUN: 22 mg/dL (ref 6–23)
CALCIUM: 9.6 mg/dL (ref 8.4–10.5)
CO2: 30 meq/L (ref 19–32)
CREATININE: 1 mg/dL (ref 0.40–1.50)
Chloride: 104 mEq/L (ref 96–112)
GFR: 79.62 mL/min (ref >60.00)
GLUCOSE: 98 mg/dL (ref 70–99)
Potassium: 5.1 mEq/L (ref 3.5–5.1)
Sodium: 140 mEq/L (ref 135–145)

## 2016-01-17 LAB — CBC WITH DIFFERENTIAL/PLATELET
BASOS ABS: 0.1 10*3/uL (ref 0.0–0.1)
Basophils Relative: 1.1 % (ref 0.0–3.0)
EOS PCT: 1.2 % (ref 0.0–5.0)
Eosinophils Absolute: 0.1 10*3/uL (ref 0.0–0.7)
HEMATOCRIT: 44.3 % (ref 39.0–52.0)
Hemoglobin: 14.6 g/dL (ref 13.0–17.0)
LYMPHS ABS: 1.7 10*3/uL (ref 0.7–4.0)
LYMPHS PCT: 35.2 % (ref 12.0–46.0)
MCHC: 33 g/dL (ref 30.0–36.0)
MCV: 100.3 fl — AB (ref 78.0–100.0)
MONOS PCT: 9 % (ref 3.0–12.0)
Monocytes Absolute: 0.4 10*3/uL (ref 0.1–1.0)
NEUTROS ABS: 2.6 10*3/uL (ref 1.4–7.7)
NEUTROS PCT: 53.5 % (ref 43.0–77.0)
PLATELETS: 383 10*3/uL (ref 150.0–400.0)
RBC: 4.41 Mil/uL (ref 4.22–5.81)
RDW: 14.1 % (ref 11.5–15.5)
WBC: 4.9 10*3/uL (ref 4.0–10.5)

## 2016-01-17 LAB — POCT URINALYSIS DIPSTICK
BILIRUBIN UA: NEGATIVE
Glucose, UA: NEGATIVE
Ketones, UA: NEGATIVE
Leukocytes, UA: NEGATIVE
NITRITE UA: NEGATIVE
PH UA: 6
Protein, UA: NEGATIVE
RBC UA: NEGATIVE
Spec Grav, UA: <=1.005
UROBILINOGEN UA: 0.2

## 2016-01-17 LAB — HEPATIC FUNCTION PANEL
ALT: 21 U/L (ref 0–53)
AST: 19 U/L (ref 0–37)
Albumin: 4.3 g/dL (ref 3.5–5.2)
Alkaline Phosphatase: 67 U/L (ref 39–117)
BILIRUBIN DIRECT: 0.2 mg/dL (ref 0.0–0.3)
BILIRUBIN TOTAL: 0.9 mg/dL (ref 0.2–1.2)
TOTAL PROTEIN: 7.1 g/dL (ref 6.0–8.3)

## 2016-01-17 LAB — PSA: PSA: 0.88 ng/mL (ref 0.10–4.00)

## 2016-01-17 LAB — TSH: TSH: 3.81 u[IU]/mL (ref 0.35–4.50)

## 2016-01-17 LAB — LIPID PANEL
CHOL/HDL RATIO: 3
Cholesterol: 178 mg/dL (ref 0–200)
HDL: 58.5 mg/dL (ref >39.00)
LDL Cholesterol: 111 mg/dL — ABNORMAL HIGH (ref 0–99)
NONHDL: 119.16
Triglycerides: 41 mg/dL (ref 0.0–149.0)
VLDL: 8.2 mg/dL (ref 0.0–40.0)

## 2016-01-19 DIAGNOSIS — M546 Pain in thoracic spine: Secondary | ICD-10-CM | POA: Diagnosis not present

## 2016-01-24 ENCOUNTER — Ambulatory Visit
Admission: RE | Admit: 2016-01-24 | Discharge: 2016-01-24 | Disposition: A | Discharge status: Home / Self Care | Payer: Medicare Other | Source: Ambulatory Visit | Attending: Family Medicine | Admitting: Family Medicine

## 2016-01-24 DIAGNOSIS — N281 Cyst of kidney, acquired: Secondary | ICD-10-CM

## 2016-01-24 DIAGNOSIS — N2889 Other specified disorders of kidney and ureter: Secondary | ICD-10-CM | POA: Diagnosis not present

## 2016-01-24 MED ORDER — GADOBENATE DIMEGLUMINE 529 MG/ML IV SOLN
19.0000 mL | Freq: Once | INTRAVENOUS | Status: AC | PRN
  Administered 2016-01-24: 19 mL via INTRAVENOUS

## 2016-01-25 ENCOUNTER — Telehealth: Payer: Self-pay | Admitting: Family Medicine

## 2016-01-25 NOTE — Telephone Encounter (Signed)
I called Max [name] today and discussed his scans with him and his wife Max [name]. I explained to him that there is indeed a lesion in the posterior portion of the lower pole the right kidney that could possibly be a renal cell carcinoma. There is also a cyst in the right kidney anteriorly also left inguinal hernia and a 13 mm lesion in the right lower lobe which cannot be fully characterized on MRI. I explained to him and Max [name] that at this juncture we have put a call into Dr. Alinda Money and his team for further evaluation. Dr. Alinda Money is well-known to the [facility] family. Max [name] said that Dr. Alinda Money operated on his brother. They understood the situation and will follow-up are directions

## 2016-01-28 ENCOUNTER — Other Ambulatory Visit: Payer: Self-pay | Admitting: Family Medicine

## 2016-01-28 ENCOUNTER — Ambulatory Visit: Payer: Medicare Other | Admitting: Family Medicine

## 2016-01-28 DIAGNOSIS — R9389 Abnormal findings on diagnostic imaging of other specified body structures: Secondary | ICD-10-CM

## 2016-01-31 DIAGNOSIS — Z0000 Encounter for general adult medical examination without abnormal findings: Secondary | ICD-10-CM | POA: Diagnosis not present

## 2016-01-31 DIAGNOSIS — D49511 Neoplasm of unspecified behavior of right kidney: Secondary | ICD-10-CM | POA: Diagnosis not present

## 2016-02-11 DIAGNOSIS — Z0000 Encounter for general adult medical examination without abnormal findings: Secondary | ICD-10-CM | POA: Diagnosis not present

## 2016-02-11 DIAGNOSIS — D49511 Neoplasm of unspecified behavior of right kidney: Secondary | ICD-10-CM | POA: Diagnosis not present

## 2016-02-11 DIAGNOSIS — R918 Other nonspecific abnormal finding of lung field: Secondary | ICD-10-CM | POA: Diagnosis not present

## 2016-02-12 ENCOUNTER — Other Ambulatory Visit: Payer: Self-pay | Admitting: Urology

## 2016-02-13 DIAGNOSIS — R222 Localized swelling, mass and lump, trunk: Secondary | ICD-10-CM | POA: Diagnosis not present

## 2016-02-13 DIAGNOSIS — R918 Other nonspecific abnormal finding of lung field: Secondary | ICD-10-CM | POA: Diagnosis not present

## 2016-02-25 NOTE — Patient Instructions (Signed)
Max [name]  02/25/2016   Your procedure is scheduled on: 02/29/2016    Report to [facility]  Entrance take [place]  elevators to 3rd floor to  [place] at     [place] AM.  Call this number if you have problems the morning of surgery [phone]   Remember: ONLY 1 PERSON MAY GO WITH YOU TO SHORT STAY TO GET  READY MORNING OF Max [name].  Do not eat food or drink liquids :After Midnight.     Take these medicines the morning of surgery with A SIP OF WATER: Prilosec, Verapamil ( Calan)                                You may not have any metal on your body including hair pins and              piercings  Do not wear jewelry, make-up, lotions, powders or perfumes, deodorant             Do not wear nail polish.  Do not shave  48 hours prior to surgery.              Men may shave face and neck.   Do not bring valuables to the hospital. Max [name].  Contacts, dentures or bridgework may not be worn into surgery.  Leave suitcase in the car. After surgery it may be brought to your room.         Special Instructions: coughing and deep breathing exercises, leg exercises               Please read over the following fact sheets you were given: _____________________________________________________________________             [facility] - Preparing for Surgery Before surgery, you can play an important role.  Because skin is not sterile, your skin needs to be as free of germs as possible.  You can reduce the number of germs on your skin by washing with CHG (chlorahexidine gluconate) soap before surgery.  CHG is an antiseptic cleaner which kills germs and bonds with the skin to continue killing germs even after washing. Please DO NOT use if you have an allergy to CHG or antibacterial soaps.  If your skin becomes reddened/irritated stop using the CHG and inform your nurse when you arrive at Short Stay. Do not  shave (including legs and underarms) for at least 48 hours prior to the first CHG shower.  You may shave your face/neck. Please follow these instructions carefully:  1.  Shower with CHG Soap the night before surgery and the  morning of Surgery.  2.  If you choose to wash your hair, wash your hair first as usual with your  normal  shampoo.  3.  After you shampoo, rinse your hair and body thoroughly to remove the  shampoo.                           4.  Use CHG as you would any other liquid soap.  You can apply chg directly  to the skin and wash  Gently with a scrungie or clean washcloth.  5.  Apply the CHG Soap to your body ONLY FROM THE NECK DOWN.   Do not use on face/ open                           Wound or open sores. Avoid contact with eyes, ears mouth and genitals (private parts).                       Wash face,  Genitals (private parts) with your normal soap.             6.  Wash thoroughly, paying special attention to the area where your surgery  will be performed.  7.  Thoroughly rinse your body with warm water from the neck down.  8.  DO NOT shower/wash with your normal soap after using and rinsing off  the CHG Soap.                9.  Pat yourself dry with a clean towel.            10.  Wear clean pajamas.            11.  Place clean sheets on your bed the night of your first shower and do not  sleep with pets. Day of Surgery : Do not apply any lotions/deodorants the morning of surgery.  Please wear clean clothes to the hospital/surgery center.  FAILURE TO FOLLOW THESE INSTRUCTIONS MAY RESULT IN THE CANCELLATION OF YOUR SURGERY PATIENT SIGNATURE_________________________________  NURSE SIGNATURE__________________________________  ________________________________________________________________________  WHAT IS A BLOOD TRANSFUSION? Blood Transfusion Information  A transfusion is the replacement of blood or some of its parts. Blood is made up of multiple cells  which provide different functions.  Red blood cells carry oxygen and are used for blood loss replacement.  White blood cells fight against infection.  Platelets control bleeding.  Plasma helps clot blood.  Other blood products are available for specialized needs, such as hemophilia or other clotting disorders. BEFORE THE TRANSFUSION  Who gives blood for transfusions?   Healthy volunteers who are fully evaluated to make sure their blood is safe. This is blood bank blood. Transfusion therapy is the safest it has ever been in the practice of medicine. Before blood is taken from a donor, a complete history is taken to make sure that person has no history of diseases nor engages in risky social behavior (examples are intravenous drug use or sexual activity with multiple partners). The donor's travel history is screened to minimize risk of transmitting infections, such as malaria. The donated blood is tested for signs of infectious diseases, such as HIV and hepatitis. The blood is then tested to be sure it is compatible with you in order to minimize the chance of a transfusion reaction. If you or a relative donates blood, this is often done in anticipation of surgery and is not appropriate for emergency situations. It takes many days to process the donated blood. RISKS AND COMPLICATIONS Although transfusion therapy is very safe and saves many lives, the main dangers of transfusion include:   Getting an infectious disease.  Developing a transfusion reaction. This is an allergic reaction to something in the blood you were given. Every precaution is taken to prevent this. The decision to have a blood transfusion has been considered carefully by your caregiver before blood is given. Blood is not given unless the benefits outweigh  the risks. AFTER THE TRANSFUSION  Right after receiving a blood transfusion, you will usually feel much better and more energetic. This is especially true if your red blood  cells have gotten low (anemic). The transfusion raises the level of the red blood cells which carry oxygen, and this usually causes an energy increase.  The nurse administering the transfusion will monitor you carefully for complications. HOME CARE INSTRUCTIONS  No special instructions are needed after a transfusion. You may find your energy is better. Speak with your caregiver about any limitations on activity for underlying diseases you may have. SEEK MEDICAL CARE IF:   Your condition is not improving after your transfusion.  You develop redness or irritation at the intravenous (IV) site. SEEK IMMEDIATE MEDICAL CARE IF:  Any of the following symptoms occur over the next 12 hours:  Shaking chills.  You have a temperature by mouth above 102 F (38.9 C), not controlled by medicine.  Chest, back, or muscle pain.  People around you feel you are not acting correctly or are confused.  Shortness of breath or difficulty breathing.  Dizziness and fainting.  You get a rash or develop hives.  You have a decrease in urine output.  Your urine turns a dark color or changes to pink, red, or brown. Any of the following symptoms occur over the next 10 days:  You have a temperature by mouth above 102 F (38.9 C), not controlled by medicine.  Shortness of breath.  Weakness after normal activity.  The white part of the eye turns yellow (jaundice).  You have a decrease in the amount of urine or are urinating less often.  Your urine turns a dark color or changes to pink, red, or brown. Document Released: 11/28/2000 Document Revised: 02/23/2012 Document Reviewed: 07/17/2008 [facility] Patient Information 2014 [place], [place].  _______________________________________________________________________

## 2016-02-26 ENCOUNTER — Encounter (HOSPITAL_COMMUNITY): Payer: Self-pay

## 2016-02-26 ENCOUNTER — Encounter (HOSPITAL_COMMUNITY)
Admission: RE | Admit: 2016-02-26 | Discharge: 2016-02-26 | Disposition: A | Discharge status: Home / Self Care | Payer: Medicare Other | Source: Ambulatory Visit | Attending: Urology | Admitting: Urology

## 2016-02-26 DIAGNOSIS — D3001 Benign neoplasm of right kidney: Secondary | ICD-10-CM | POA: Diagnosis not present

## 2016-02-26 DIAGNOSIS — I10 Essential (primary) hypertension: Secondary | ICD-10-CM | POA: Diagnosis not present

## 2016-02-26 DIAGNOSIS — Z86011 Personal history of benign neoplasm of the brain: Secondary | ICD-10-CM | POA: Diagnosis not present

## 2016-02-26 DIAGNOSIS — Z01812 Encounter for preprocedural laboratory examination: Secondary | ICD-10-CM | POA: Diagnosis not present

## 2016-02-26 HISTORY — DX: Anxiety disorder, unspecified: F41.9

## 2016-02-26 HISTORY — DX: Benign neoplasm of meninges, unspecified: D32.9

## 2016-02-26 LAB — CBC
HCT: 44.7 % (ref 39.0–52.0)
Hemoglobin: 14.8 g/dL (ref 13.0–17.0)
MCH: 33.6 pg (ref 26.0–34.0)
MCHC: 33.1 g/dL (ref 30.0–36.0)
MCV: 101.4 fL — ABNORMAL HIGH (ref 78.0–100.0)
PLATELETS: 377 10*3/uL (ref 150–400)
RBC: 4.41 MIL/uL (ref 4.22–5.81)
RDW: 13.5 % (ref 11.5–15.5)
WBC: 5.2 10*3/uL (ref 4.0–10.5)

## 2016-02-26 LAB — BASIC METABOLIC PANEL
ANION GAP: 10 (ref 5–15)
BUN: 20 mg/dL (ref 6–20)
CALCIUM: 9.6 mg/dL (ref 8.9–10.3)
CO2: 27 mmol/L (ref 22–32)
CREATININE: 1.01 mg/dL (ref 0.61–1.24)
Chloride: 106 mmol/L (ref 101–111)
GFR calc non Af Amer: >60 mL/min (ref >60)
GLUCOSE: 103 mg/dL — AB (ref 65–99)
POTASSIUM: 5 mmol/L (ref 3.5–5.1)
SODIUM: 143 mmol/L (ref 135–145)

## 2016-02-26 LAB — ABO/RH: ABO/RH(D): A POS

## 2016-02-27 NOTE — Progress Notes (Signed)
Final EKG done 02/26/16 in [facility]

## 2016-02-29 ENCOUNTER — Inpatient Hospital Stay (HOSPITAL_COMMUNITY)
Admission: RE | Admit: 2016-02-29 | Discharge: 2016-03-02 | DRG: 658 | Disposition: A | Discharge status: Home / Self Care | Payer: Medicare Other | Source: Ambulatory Visit | Attending: Urology | Admitting: Urology

## 2016-02-29 ENCOUNTER — Inpatient Hospital Stay (HOSPITAL_COMMUNITY): Payer: Medicare Other | Admitting: Anesthesiology

## 2016-02-29 ENCOUNTER — Encounter (HOSPITAL_COMMUNITY): Payer: Self-pay | Admitting: *Deleted

## 2016-02-29 ENCOUNTER — Encounter (HOSPITAL_COMMUNITY)
Admission: RE | Disposition: A | Discharge status: Home / Self Care | Payer: Self-pay | Source: Ambulatory Visit | Attending: Urology

## 2016-02-29 DIAGNOSIS — Z01812 Encounter for preprocedural laboratory examination: Secondary | ICD-10-CM

## 2016-02-29 DIAGNOSIS — I10 Essential (primary) hypertension: Secondary | ICD-10-CM | POA: Diagnosis present

## 2016-02-29 DIAGNOSIS — Z86011 Personal history of benign neoplasm of the brain: Secondary | ICD-10-CM | POA: Diagnosis not present

## 2016-02-29 DIAGNOSIS — D49511 Neoplasm of unspecified behavior of right kidney: Secondary | ICD-10-CM | POA: Diagnosis not present

## 2016-02-29 DIAGNOSIS — Z23 Encounter for immunization: Secondary | ICD-10-CM

## 2016-02-29 DIAGNOSIS — D3001 Benign neoplasm of right kidney: Principal | ICD-10-CM | POA: Diagnosis present

## 2016-02-29 DIAGNOSIS — N2889 Other specified disorders of kidney and ureter: Secondary | ICD-10-CM

## 2016-02-29 DIAGNOSIS — D3000 Benign neoplasm of unspecified kidney: Secondary | ICD-10-CM | POA: Diagnosis not present

## 2016-02-29 HISTORY — PX: ROBOTIC ASSITED PARTIAL NEPHRECTOMY: SHX6087

## 2016-02-29 LAB — TYPE AND SCREEN
ABO/RH(D): A POS
Antibody Screen: NEGATIVE

## 2016-02-29 LAB — HEMOGLOBIN AND HEMATOCRIT, BLOOD
HCT: 42 % (ref 39.0–52.0)
Hemoglobin: 14 g/dL (ref 13.0–17.0)

## 2016-02-29 SURGERY — NEPHRECTOMY, PARTIAL, ROBOT-ASSISTED
Anesthesia: General | Laterality: Right

## 2016-02-29 MED ORDER — MANNITOL 25 % IV SOLN
25.0000 g | INTRAVENOUS | Status: AC
  Filled 2016-02-29: qty 100

## 2016-02-29 MED ORDER — ROCURONIUM BROMIDE 100 MG/10ML IV SOLN
INTRAVENOUS | Status: AC
Start: 2016-02-29 — End: 2016-02-29
  Filled 2016-02-29: qty 1

## 2016-02-29 MED ORDER — ONDANSETRON HCL 4 MG/2ML IJ SOLN
INTRAMUSCULAR | Status: AC
Start: 2016-02-29 — End: 2016-02-29
  Filled 2016-02-29: qty 2

## 2016-02-29 MED ORDER — HYDROMORPHONE HCL 1 MG/ML IJ SOLN
INTRAMUSCULAR | Status: AC
  Filled 2016-02-29: qty 1

## 2016-02-29 MED ORDER — ACETAMINOPHEN 10 MG/ML IV SOLN
INTRAVENOUS | Status: AC
  Administered 2016-02-29: 1000 mg
  Filled 2016-02-29: qty 100

## 2016-02-29 MED ORDER — DEXTROSE-NACL 5-0.45 % IV SOLN
INTRAVENOUS | Status: DC
  Administered 2016-02-29 – 2016-03-01 (×4): via INTRAVENOUS

## 2016-02-29 MED ORDER — DIPHENHYDRAMINE HCL 50 MG/ML IJ SOLN: 12.5000 mg | Freq: Four times a day (QID) | INTRAMUSCULAR | Status: DC | PRN

## 2016-02-29 MED ORDER — PNEUMOCOCCAL VAC POLYVALENT 25 MCG/0.5ML IJ INJ
0.5000 mL | INJECTION | INTRAMUSCULAR | Status: AC
  Administered 2016-03-01: 0.5 mL via INTRAMUSCULAR
  Filled 2016-02-29 (×3): qty 0.5

## 2016-02-29 MED ORDER — DEXAMETHASONE SODIUM PHOSPHATE 10 MG/ML IJ SOLN
INTRAMUSCULAR | Status: DC | PRN
  Administered 2016-02-29: 10 mg via INTRAVENOUS

## 2016-02-29 MED ORDER — OXYCODONE-ACETAMINOPHEN 5-325 MG PO TABS: 1.0000 | ORAL_TABLET | Freq: Four times a day (QID) | ORAL | Status: DC | PRN

## 2016-02-29 MED ORDER — LIDOCAINE HCL (CARDIAC) 20 MG/ML IV SOLN
INTRAVENOUS | Status: DC | PRN
  Administered 2016-02-29: 100 mg via INTRAVENOUS

## 2016-02-29 MED ORDER — PROPOFOL 10 MG/ML IV BOLUS
INTRAVENOUS | Status: DC | PRN
  Administered 2016-02-29: 200 mg via INTRAVENOUS

## 2016-02-29 MED ORDER — PHENYLEPHRINE HCL 10 MG/ML IJ SOLN
INTRAMUSCULAR | Status: DC | PRN
  Administered 2016-02-29: 40 ug via INTRAVENOUS
  Administered 2016-02-29: 120 ug via INTRAVENOUS

## 2016-02-29 MED ORDER — HYDROMORPHONE HCL 1 MG/ML IJ SOLN
0.2500 mg | INTRAMUSCULAR | Status: DC | PRN
  Administered 2016-02-29 (×4): 0.5 mg via INTRAVENOUS

## 2016-02-29 MED ORDER — MIDAZOLAM HCL 2 MG/2ML IJ SOLN
INTRAMUSCULAR | Status: AC
  Filled 2016-02-29: qty 2

## 2016-02-29 MED ORDER — SUGAMMADEX SODIUM 200 MG/2ML IV SOLN
INTRAVENOUS | Status: DC | PRN
  Administered 2016-02-29: 200 mg via INTRAVENOUS

## 2016-02-29 MED ORDER — ONDANSETRON HCL 4 MG/2ML IJ SOLN
INTRAMUSCULAR | Status: DC | PRN
  Administered 2016-02-29: 4 mg via INTRAVENOUS

## 2016-02-29 MED ORDER — SODIUM CHLORIDE 0.9 % IJ SOLN
INTRAMUSCULAR | Status: AC
  Filled 2016-02-29: qty 20

## 2016-02-29 MED ORDER — PROMETHAZINE HCL 25 MG/ML IJ SOLN
6.2500 mg | INTRAMUSCULAR | Status: DC | PRN
  Administered 2016-02-29: 12.5 mg via INTRAVENOUS

## 2016-02-29 MED ORDER — EPHEDRINE SULFATE 50 MG/ML IJ SOLN
INTRAMUSCULAR | Status: DC | PRN
  Administered 2016-02-29: 5 mg via INTRAVENOUS

## 2016-02-29 MED ORDER — HYDROMORPHONE HCL 1 MG/ML IJ SOLN
0.5000 mg | INTRAMUSCULAR | Status: DC | PRN
  Administered 2016-02-29: 0.5 mg via INTRAVENOUS
  Filled 2016-02-29: qty 1

## 2016-02-29 MED ORDER — STERILE WATER FOR IRRIGATION IR SOLN
Status: DC | PRN
  Administered 2016-02-29: 1000 mL

## 2016-02-29 MED ORDER — BUPIVACAINE LIPOSOME 1.3 % IJ SUSP
20.0000 mL | Freq: Once | INTRAMUSCULAR | Status: AC
  Administered 2016-02-29: 20 mL
  Filled 2016-02-29: qty 20

## 2016-02-29 MED ORDER — ACETAMINOPHEN 500 MG PO TABS
1000.0000 mg | ORAL_TABLET | Freq: Four times a day (QID) | ORAL | Status: AC
  Administered 2016-02-29 – 2016-03-01 (×4): 1000 mg via ORAL
  Filled 2016-02-29 (×5): qty 2

## 2016-02-29 MED ORDER — PANTOPRAZOLE SODIUM 40 MG PO TBEC
40.0000 mg | DELAYED_RELEASE_TABLET | Freq: Every day | ORAL | Status: DC
  Administered 2016-03-01 – 2016-03-02 (×2): 40 mg via ORAL
  Filled 2016-02-29 (×3): qty 1

## 2016-02-29 MED ORDER — LIDOCAINE HCL (CARDIAC) 20 MG/ML IV SOLN
INTRAVENOUS | Status: AC
  Filled 2016-02-29: qty 5

## 2016-02-29 MED ORDER — MIDAZOLAM HCL 5 MG/5ML IJ SOLN
INTRAMUSCULAR | Status: DC | PRN
  Administered 2016-02-29: 2 mg via INTRAVENOUS

## 2016-02-29 MED ORDER — LACTATED RINGERS IR SOLN
Status: DC | PRN
Start: 2016-02-29 — End: 2016-02-29
  Administered 2016-02-29: 1000 mL

## 2016-02-29 MED ORDER — FENTANYL CITRATE (PF) 100 MCG/2ML IJ SOLN
INTRAMUSCULAR | Status: DC | PRN
Start: 2016-02-29 — End: 2016-02-29
  Administered 2016-02-29 (×3): 50 ug via INTRAVENOUS

## 2016-02-29 MED ORDER — ONDANSETRON HCL 4 MG/2ML IJ SOLN
4.0000 mg | INTRAMUSCULAR | Status: DC | PRN
  Administered 2016-02-29: 4 mg via INTRAVENOUS
  Filled 2016-02-29 (×2): qty 2

## 2016-02-29 MED ORDER — SUGAMMADEX SODIUM 200 MG/2ML IV SOLN
INTRAVENOUS | Status: AC
  Filled 2016-02-29: qty 2

## 2016-02-29 MED ORDER — SODIUM CHLORIDE 0.9 % IV SOLN
1.5000 g | INTRAVENOUS | Status: AC
  Administered 2016-02-29: 1.5 g via INTRAVENOUS
  Filled 2016-02-29: qty 1.5

## 2016-02-29 MED ORDER — DIPHENHYDRAMINE HCL 12.5 MG/5ML PO ELIX: 12.5000 mg | ORAL_SOLUTION | Freq: Four times a day (QID) | ORAL | Status: DC | PRN

## 2016-02-29 MED ORDER — LACTATED RINGERS IV SOLN
INTRAVENOUS | Status: DC | PRN
  Administered 2016-02-29 (×3): via INTRAVENOUS

## 2016-02-29 MED ORDER — FENTANYL CITRATE (PF) 250 MCG/5ML IJ SOLN
INTRAMUSCULAR | Status: AC
  Filled 2016-02-29: qty 5

## 2016-02-29 MED ORDER — PROMETHAZINE HCL 25 MG/ML IJ SOLN
INTRAMUSCULAR | Status: AC
  Filled 2016-02-29: qty 1

## 2016-02-29 MED ORDER — SODIUM CHLORIDE 0.9 % IJ SOLN
INTRAMUSCULAR | Status: AC
  Filled 2016-02-29: qty 10

## 2016-02-29 MED ORDER — SUCCINYLCHOLINE CHLORIDE 20 MG/ML IJ SOLN
INTRAMUSCULAR | Status: DC | PRN
  Administered 2016-02-29: 100 mg via INTRAVENOUS

## 2016-02-29 MED ORDER — EPHEDRINE SULFATE 50 MG/ML IJ SOLN
INTRAMUSCULAR | Status: AC
  Filled 2016-02-29: qty 1

## 2016-02-29 MED ORDER — OXYCODONE HCL 5 MG PO TABS
5.0000 mg | ORAL_TABLET | ORAL | Status: DC | PRN
  Administered 2016-03-01 – 2016-03-02 (×5): 5 mg via ORAL
  Filled 2016-02-29 (×6): qty 1

## 2016-02-29 MED ORDER — MANNITOL 25 % IV SOLN
INTRAVENOUS | Status: DC | PRN
  Administered 2016-02-29 (×2): 12.5 g via INTRAVENOUS

## 2016-02-29 MED ORDER — PROPOFOL 10 MG/ML IV BOLUS
INTRAVENOUS | Status: AC
  Filled 2016-02-29: qty 40

## 2016-02-29 MED ORDER — PROPOFOL 10 MG/ML IV BOLUS
INTRAVENOUS | Status: AC
  Filled 2016-02-29: qty 20

## 2016-02-29 MED ORDER — SENNOSIDES-DOCUSATE SODIUM 8.6-50 MG PO TABS
1.0000 | ORAL_TABLET | Freq: Two times a day (BID) | ORAL | Status: DC
  Administered 2016-02-29 – 2016-03-02 (×4): 1 via ORAL
  Filled 2016-02-29 (×4): qty 1

## 2016-02-29 MED ORDER — ROCURONIUM BROMIDE 100 MG/10ML IV SOLN
INTRAVENOUS | Status: DC | PRN
  Administered 2016-02-29: 20 mg via INTRAVENOUS
  Administered 2016-02-29: 10 mg via INTRAVENOUS
  Administered 2016-02-29: 50 mg via INTRAVENOUS
  Administered 2016-02-29: 30 mg via INTRAVENOUS

## 2016-02-29 SURGICAL SUPPLY — 59 items
APPLICATOR SURGIFLO ENDO (HEMOSTASIS) ×3 IMPLANT
CHLORAPREP W/TINT 26ML (MISCELLANEOUS) ×3 IMPLANT
CLIP LIGATING HEM O LOK PURPLE (MISCELLANEOUS) ×3 IMPLANT
CLIP LIGATING HEMO LOK XL GOLD (MISCELLANEOUS) ×3 IMPLANT
CLIP LIGATING HEMO O LOK GREEN (MISCELLANEOUS) ×9 IMPLANT
CLIP SUT LAPRA TY ABSORB (SUTURE) ×6 IMPLANT
COVER TIP SHEARS 8 DVNC (MISCELLANEOUS) ×1 IMPLANT
COVER TIP SHEARS 8MM DA VINCI (MISCELLANEOUS) ×2
DECANTER SPIKE VIAL GLASS SM (MISCELLANEOUS) ×3 IMPLANT
DRAIN CHANNEL 15F RND FF 3/16 (WOUND CARE) ×3 IMPLANT
DRAPE COLUMN DVNC XI (DISPOSABLE) ×1 IMPLANT
DRAPE DA VINCI XI COLUMN (DISPOSABLE) ×2
DRAPE INCISE IOBAN 66X45 STRL (DRAPES) ×3 IMPLANT
DRAPE LAPAROSCOPIC ABDOMINAL (DRAPES) IMPLANT
DRAPE SHEET LG 3/4 BI-LAMINATE (DRAPES) ×3 IMPLANT
ELECT PENCIL ROCKER SW 15FT (MISCELLANEOUS) ×3 IMPLANT
ELECT REM PT RETURN 9FT ADLT (ELECTROSURGICAL) ×3
ELECTRODE REM PT RTRN 9FT ADLT (ELECTROSURGICAL) ×1 IMPLANT
EVACUATOR SILICONE 100CC (DRAIN) ×3 IMPLANT
GLOVE BIO SURGEON STRL SZ 6.5 (GLOVE) ×2 IMPLANT
GLOVE BIO SURGEONS STRL SZ 6.5 (GLOVE) ×1
GLOVE BIOGEL M STRL SZ7.5 (GLOVE) ×6 IMPLANT
GOWN STRL REUS W/TWL LRG LVL3 (GOWN DISPOSABLE) ×6 IMPLANT
HEMOSTAT SURGICEL 4X8 (HEMOSTASIS) ×3 IMPLANT
KIT BASIN OR (CUSTOM PROCEDURE TRAY) ×3 IMPLANT
LIQUID BAND (GAUZE/BANDAGES/DRESSINGS) ×3 IMPLANT
LOOP VESSEL MAXI BLUE (MISCELLANEOUS) ×3 IMPLANT
MARKER SKIN DUAL TIP RULER LAB (MISCELLANEOUS) ×3 IMPLANT
NEEDLE INSUFFLATION 14GA 120MM (NEEDLE) ×3 IMPLANT
NS IRRIG 1000ML POUR BTL (IV SOLUTION) ×3 IMPLANT
POSITIONER SURGICAL ARM (MISCELLANEOUS) ×3 IMPLANT
POUCH SPECIMEN RETRIEVAL 10MM (ENDOMECHANICALS) ×3 IMPLANT
RELOAD STAPLER WHITE 60MM (STAPLE) IMPLANT
SET TUBE IRRIG SUCTION NO TIP (IRRIGATION / IRRIGATOR) ×3 IMPLANT
SOLUTION ELECTROLUBE (MISCELLANEOUS) ×3 IMPLANT
SPONGE LAP 4X18 X RAY DECT (DISPOSABLE) ×3 IMPLANT
STAPLE ECHEON FLEX 60 POW ENDO (STAPLE) IMPLANT
STAPLER RELOAD WHITE 60MM (STAPLE)
SURGIFLO W/THROMBIN 8M KIT (HEMOSTASIS) ×3 IMPLANT
SUT ETHILON 3 0 PS 1 (SUTURE) ×3 IMPLANT
SUT MNCRL AB 4-0 PS2 18 (SUTURE) ×6 IMPLANT
SUT PDS AB 1 CT1 27 (SUTURE) ×6 IMPLANT
SUT V-LOC BARB 180 2/0GR6 GS22 (SUTURE)
SUT VIC AB 0 CT1 27 (SUTURE) ×10
SUT VIC AB 0 CT1 27XBRD ANTBC (SUTURE) ×5 IMPLANT
SUT VIC AB 2-0 SH 27 (SUTURE) ×2
SUT VIC AB 2-0 SH 27X BRD (SUTURE) ×1 IMPLANT
SUT VLOC BARB 180 ABS3/0GR12 (SUTURE) ×6
SUTURE V-LC BRB 180 2/0GR6GS22 (SUTURE) IMPLANT
SUTURE VLOC BRB 180 ABS3/0GR12 (SUTURE) ×2 IMPLANT
TAPE STRIPS DRAPE STRL (GAUZE/BANDAGES/DRESSINGS) ×3 IMPLANT
TOWEL OR 17X26 10 PK STRL BLUE (TOWEL DISPOSABLE) ×3 IMPLANT
TOWEL OR NON WOVEN STRL DISP B (DISPOSABLE) ×3 IMPLANT
TRAY FOLEY W/METER SILVER 14FR (SET/KITS/TRAYS/PACK) IMPLANT
TRAY FOLEY W/METER SILVER 16FR (SET/KITS/TRAYS/PACK) ×3 IMPLANT
TRAY LAPAROSCOPIC (CUSTOM PROCEDURE TRAY) ×3 IMPLANT
TROCAR BLADELESS OPT 5 100 (ENDOMECHANICALS) ×3 IMPLANT
TROCAR XCEL 12X100 BLDLESS (ENDOMECHANICALS) ×3 IMPLANT
WATER STERILE IRR 1500ML POUR (IV SOLUTION) ×3 IMPLANT

## 2016-02-29 NOTE — Care Management Note (Signed)
Case Management Note  Patient Details  Name: Max [name] MRN: [id number] Date of Birth: [birth year]  Subjective/Objective:  65 y/o m admitted w/renal mass.s/p nephrectomy. From home.                  Action/Plan:d/c plan home.   Expected Discharge Date:                  Expected Discharge Plan:  Home/Self Care  In-House Referral:     Discharge planning Services  CM Consult  Post Acute Care Choice:    Choice offered to:     DME Arranged:    DME Agency:     HH Arranged:    HH Agency:     Status of Service:  In process, will continue to follow  Medicare Important Message Given:    Date Medicare IM Given:    Medicare IM give by:    Date Additional Medicare IM Given:    Additional Medicare Important Message give by:     If discussed at [place] of Stay Meetings, dates discussed:    Additional Comments:  Dessa Phi, RN 02/29/2016, 3:16 PM

## 2016-02-29 NOTE — Anesthesia Postprocedure Evaluation (Signed)
Anesthesia Post Note  Patient: Max [name]  Procedure(s) Performed: Procedure(s) (LRB): XI ROBOTIC ASSITED PARTIAL NEPHRECTOMY (Right)  Patient location during evaluation: PACU Anesthesia Type: General Level of consciousness: awake and alert Pain management: pain level controlled Vital Signs Assessment: post-procedure vital signs reviewed and stable Respiratory status: spontaneous breathing, nonlabored ventilation, respiratory function stable and patient connected to nasal cannula oxygen Cardiovascular status: blood pressure returned to baseline and stable Postop Assessment: no signs of nausea or vomiting Anesthetic complications: no    Last Vitals:  Filed Vitals:   02/29/16 1345 02/29/16 1400  BP: 155/113 144/72  Pulse: 106 69  Temp:    Resp: 21 10    Last Pain:  Filed Vitals:   02/29/16 1411  PainSc: 5                  Terrilyn Tyner J

## 2016-02-29 NOTE — Op Note (Signed)
NAME:  Max [name], Max [name] NO.:  [id number]  MEDICAL RECORD NO.:  [id number]  LOCATION:                                 FACILITY:  PHYSICIAN:  Alexis Frock, MD     DATE OF BIRTH:  Oct 27, 1951  DATE OF PROCEDURE: 02/29/2016                              OPERATIVE REPORT  DIAGNOSIS:  Endophytic right renal mass.  PROCEDURES: 1. Robotic-assisted laparoscopic right partial nephrectomy. 2. Intraoperative ultrasound with interpretation.  ESTIMATED BLOOD LOSS:  100 mL.  COMPLICATION:  None.  SPECIMENS: 1. Right lateral renal mass. 2. Base of right lateral renal mass. 3. Right anterior renal mass.  ASSISTANT:  Debbrah Alar, PA.  FINDINGS: 1. Two-artery, two-vein right renovascular anatomy as anticipated     excess right lower pole vessels. 2. Very endophytic right mid lateral renal mass at least 95%     endophytic and hyperechoic relative to surrounding renal parenchyma     with intraoperative ultrasound. 3. Incidental very small approximately 6 mm anterior renal mass     somewhat concerning for separate foci of the small tumor.  INDICATION:  Mr. Escoe is a very pleasant 65 year old gentleman, who was found incidentally on workup of abdominal pain to have a very endophytic enhancing right renal mass reason for localized renal cell carcinoma.  He did have chest imaging, which revealed a very small chest lesion and given the size of his renal masses, felt to likely represent separate etiology.  We discussed various management options for his renal mass in detail including surveillance versus ablation versus surgery with and without nephron sparing and with and without minimally invasive assistance and they wished to proceed with some robotic partial nephrectomy.  Informed consent was obtained and placed in the medical record.  PROCEDURE IN DETAIL:  The patient being Max [name], verified right partial nephrectomy was confirmed.  Procedure was carried  out. Time-out was performed.  Intravenous antibiotics were administered. General endotracheal anesthesia was introduced.  The patient was placed into a right side up, full flank position, applying 15 degrees of stable flexion, superior arm elevator, axillary roll, sequential compression devices, bottom leg bent, top leg straight.  Beanbag was deployed, and he was further fashioned to operative table using 3-inch tape over foam padding across the supraxiphoid chest and pelvis.  Notably, a Foley catheter had been placed using sterile technique before turning lateral. Sterile field was created by first clipper shaving and then prepping the patient's entire right flank and abdomen using chlorhexidine gluconate. Next, a high-flow low-pressure pneumoperitoneum was obtained using Veress technique in the right lower quadrant having passed the aspiration and drop test.  Next, an 8 mm robotic camera port was placed in position approximately 1 handbreadth superolateral to the umbilicus. Laparoscopic examination of the peritoneal cavity revealed no significant adhesions at this point.  The liver was quite large with the inferior margin passing nearly to the pelvis.  A 5 mm subxiphoid port was placed to allow superior traction of the liver.  There were some adhesions between the inferior liver edge, gallbladder junction, and omentum likely representing a prior episode of cholecystitis.  This did not allow the liver edge to mobilize at this point.  As such, the additional ports were placed as follows; right subcostal 8-mm robotic port, right far lateral 8-mm robotic port, 4 fingerbreadths superomedial to the anterosuperior iliac spine.  Right paramedian inferior robotic port approximately 1 handbreadth superior to the pubic ramus and 2 assistant ports in the midline,  both 12 mm, one 2 fingerbreadths above the camera port, one 2 fingerbreadths below.  Robot was docked and passed through electronic  checks.  Initial attention was directed at adhesiolysis.  Using careful cold scissor dissection, the aforementioned adhesions between the omentum and the inferior liver edge were carefully taken down.  This allowed much better visualization in the area of the gallbladder fossa and a self-locking grasper was used from the aforementioned liver retraction port.  We carefully placed the liver on gentle superior traction.  This exposed the area of the kidney.  The retroperitoneum was divided incising lateral to the ascending colon from the area of the cecum towards the area of the hepatic flexure, and carefully mobilizing medially.  Lower pole of kidney was identified and placed on gentle lateral traction.  The duodenum was encountered and carefully kocherized medially, such that, it lie medial to the inferior vena cava.  Dissection proceeded medial to the lower pole of the kidney. Ureter was encountered, also placed on gentle lateral traction.  Psoas muscle was identified.  Dissection proceeded within this triangle superiorly towards the area of the renal hilum.  Renal hilum was somewhat complex as anticipated and consisted of a dominant superior artery and vein as well as an accessory lower pole artery and vein that were much smaller in caliber.  The dominant superior artery was circumferentially mobilized, marked the vessel loop as was the en bloc inferior accessory vessels.  Attention was then directed at identification of the mass in question.  Dissection proceeded directly onto the anterolateral surface of the kidney, defatting three quarters of his total surface area allowing visualization of the anterior, lateral, and posterior aspects.  Totally, a small approximately 50% exophytic estimated 6 mm lesion concerning for small focus renal cell carcinoma was noted on the anterior surface of the kidney.  This did not appear to be the mass in question.  This was interrogated with ultrasound  and found to be quite exophytic.  The kidney was rotated along its long axis to allow better exposure to the posterior aspect and using combination of direct visualization and ultrasound guidance, the mass in question in the lateral mass was identified.  Intraoperative ultrasound revealed a hyperechoic approximately 2 cm mass with increased vascularity, which was approximately 95% endophytic on the lateral-posterior surface.  Using ultrasound guidance, this was carefully escorted to the level of the parenchymal edge to guide the partial nephrectomy.  As such, warm ischemia was achieved by placing 2 bulldog clamps on the dominant artery and 1 bulldog clamp on the en bloc.  Lower pole vessels leaving the main renal vein unclamped, very careful partial nephrectomy was performed first of the dominant lateral mass using cold scissors keeping what appeared to be a rim of normal parenchyma with the mass in question.  The collecting system was purposely entered and this marked the deep aspect of the dissection. Mass was set aside, placed in an EndoCatch bag for later retrieval. First layer renorrhaphy was performed using a running 3-0 V-Loc suture oversewing a small entrance as well as several small venous sinuses appeared hemostatic at this point.  Attention was then directed at partial nephrectomy of the incidental anterior mass, which was  also performed using cold scissors keeping what appeared to be a rim of normal parenchyma.  With the specimen, this was removed and set aside for permanent pathology.  The depth of resection did not require venous sinus entrance and the base of this was fulgurated.  The bulldog clamps were then removed for total warm ischemia time of 18 minutes. Renorrhaphy was performed first at the small anterior site using 0 Vicryl sandwiched between Hem-o-Loks and Lapra-Ty's x2.  This resulted in excellent parenchymal apposition and hemostasis at this site.  The lateral  dominant site was then reinspected, and additional second layer renorrhaphy was performed by placing a Surgicel bolster into the cavity and placing 3 interrupted Vicryl parenchymal apposition sutures sandwiched between Hem-o-Lok and Lapra-Ty's.  This resulted in excellent hemostasis and parenchymal apposition of the site.  Following these maneuvers, all sponge and needle counts were correct.  The vessel loops were removed.  The kidney was lying back into anatomic position, and the retroperitoneum was re-established by reapproximating the anterior surface of Gerota's using running V-Loc, thus providing adequate anchoring of the kidneys as formal lithotripsy would not be warranted. Close suction drain was brought through the previous lateral most robotic port site near the peritoneal cavity.  Robot was then undocked. The superior most port was closed at the level of fascia using Eulas Post- Thomason suture passer and 0 Vicryl.  The specimen was retrieved by removing the dominant lateral specimen that was already in the EndoCatch bag out of the inferior most assistant port site setting aside for permanent pathology.  This site was closed at the fascia using figure-of- eight 0 Vicryl.  All incision sites were infiltrated with dilute lyophilized Marcaine and closed at the level of the skin using subcuticular Monocryl followed by Dermabond.  Drain stitch was applied and procedure terminated.  The patient tolerated the procedure well. There were no immediate periprocedural complications.  The patient was taken to postanesthesia care unit in stable condition.          ______________________________ Alexis Frock, MD     TM/MEDQ  D:  02/29/2016  T:  02/29/2016  Job:  [id number]

## 2016-02-29 NOTE — H&P (Signed)
Max [name] is an 65 y.o. male.    Chief Complaint: Pre-op RIGHT robotic partial nephrectomy  HPI:   1 - Right Renal Mass - 1.8cm 95% endophytic posterior lower 1/3 mass by CT and then dedicated MRI on eval Rt sided abd pain that tuned out likely musculoskelatal. No adenopathy. 2 artery / 2 vein right renovascular anatomy (accessory lower pole artery and vein). Ipsilateral non-complex mid 100% endophytic cyst noted as well w/o mass effect.  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 His PCP is Max Nottingham MD.   Today "Max [name]" is seen to proceed with right robotic partial nephrectomy.    Past Medical History  Diagnosis Date  . Allergic rhinitis   . Hypertension   . Herpes   . ED (erectile dysfunction)   . RA (rheumatoid arthritis) (Max [name])   . Vasovagal syncope   . Paroxysmal atrial fibrillation (HCC) 07/15/2012    a) CHADSVASc score 1b) On full-dose ASA and Verapamil  . History of syncope     in the setting of anxiety per cardiology note  . Anxiety   . Meningioma (Max [name])     had surgery   . 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 Flexible sigmoidoscopy    . Refractive surgery  ~ 2003  . Cataract extraction w/ intraocular lens implant  ~ 2001    right  . Eye surgery  1964    right; "hit w/baseball; eye hemorrhaged"  . Brain meningioma excision  2012    Family History  Problem Relation Age of Onset  . Depression Other   . Hypertension Other   . GI problems Other   . Lung disease Other    Social History:  reports that he has never smoked. He has never used smokeless tobacco. He reports that he does not drink alcohol or use illicit drugs.  Allergies:  Allergies  Allergen Reactions  . Vicodin [Hydrocodone-Acetaminophen] Nausea And Vomiting    Medications Prior to Admission  Medication  Sig Dispense Refill  . aspirin 81 MG tablet Take 81 mg by mouth daily.     . folic acid (FOLVITE) 098 MCG tablet Take 1,200 mcg by mouth daily.    . methotrexate (RHEUMATREX) 2.5 MG tablet TAKE 4 TABLETS BY MOUTH WEEKLY (Patient taking differently: Take 10 mg by mouth once a week. ) 16 tablet 5  . Multiple Vitamins-Minerals (CENTRUM SILVER ULTRA MENS PO) Take 1 tablet by mouth daily.    Marland Kitchen omeprazole (PRILOSEC) 20 MG capsule Take 1 capsule (20 mg total) by mouth daily. 100 capsule 3  . sildenafil (VIAGRA) 100 MG tablet Take 1 tablet (100 mg total) by mouth as needed for erectile dysfunction. Use as directed (Patient taking differently: Take 100 mg by mouth daily as needed for erectile dysfunction. Use as directed) 10 tablet 11  . verapamil (CALAN-SR) 180 MG CR tablet Take 1 tablet (180 mg total) by mouth daily. 90 tablet 3  . acyclovir (ZOVIRAX) 800 MG tablet Take 1 tablet (800 mg total) by mouth 3 (three) times daily. (Patient taking differently: Take 800 mg by mouth 3 (three) times daily as needed. For outbreaks) 60 tablet 1  . methotrexate (RHEUMATREX) 2.5 MG tablet TAKE 4 TABLETS BY MOUTH WEEKLY 16 tablet 11    No results found for this or any previous visit (from  the past 48 hour(s)). No results found.  Review of Systems  Constitutional: Negative.   HENT: Negative.   Eyes: Negative.   Respiratory: Negative.   Cardiovascular: Negative.   Gastrointestinal: Negative.   Genitourinary: Negative.   Musculoskeletal: Negative.   Skin: Negative.   Neurological: Negative.   Endo/Heme/Allergies: Negative.   Psychiatric/Behavioral: Negative.     Blood pressure 153/82, pulse 78, temperature 97.9 F (36.6 C), temperature source Oral, resp. rate 18, height 6' (1.829 m), weight 92.987 kg (205 lb), SpO2 97 %. Physical Exam  Constitutional: He appears well-developed.  HENT:  Head: Normocephalic.  Eyes: Pupils are equal, round, and reactive to light.  Neck: Normal range of motion.   Cardiovascular: Normal rate.   Respiratory: Effort normal.  GI: Soft.  Genitourinary:  No CVAT  Musculoskeletal: Normal range of motion.  Neurological: He is alert.  Skin: Skin is warm.  Psychiatric: He has a normal mood and affect. His behavior is normal. Judgment and thought content normal.     Assessment/Plan  1 - Right Renal Mass - We rediscussed the role of partial nephrectomy with the overall goals being a balance of trying to achieve complete surgical excision (negative margins) while minimizing loss of normally functioning kidney. We then rediscussed surgical approaches including robotic and open techniques with robotic associated with a shorter convalescence. I showed the patient on their abdomen the approximately 4-6 incision (trocar) sites as well as presumed extraction sites with robotic approach as well as possible open incision sites. We specifically readdressed that there may be need to alter operative plans according to intraopertive findings including conversion to open procedure or conversion to radical nephrectomy as well as need for adjunctive procedures such as ureteral stenting to promote correct renal healing. We rediscussed specific peri-operative risks including bleeding, infection, deep vein thrombosis, pulmonary embolism, compartment syndrome, neuropathy / neuropraxia, heart attack, stroke, death, as well as long-term risks such as non-cure / need for additional therapy and need for imaging and lab based post-op surveillance protocols. We rediscussed typical hospital course of approximately 2 day hospitalization, need for peri-operative drains / catheters, and typical post-hospital course with return to most non-strenuous activities by 2 weeks and ability to return to most jobs and more strenuous activity such as exercise by 6 weeks.   After this lengthy and detail discussion, including answering all of the patient's questions to their satisfaction, they have chosen to  proceed today as planned.  Alexis Frock, MD 02/29/2016, 6:01 AM

## 2016-02-29 NOTE — Anesthesia Procedure Notes (Signed)
Procedure Name: Intubation Date/Time: 02/29/2016 7:20 AM Performed by: Lind Covert Pre-anesthesia Checklist: Patient identified, Emergency Drugs available, Suction available, Patient being monitored and Timeout performed Patient Re-evaluated:Patient Re-evaluated prior to inductionOxygen Delivery Method: Circle system utilized Preoxygenation: Pre-oxygenation with 100% oxygen Intubation Type: IV induction Ventilation: Mask ventilation without difficulty Laryngoscope Size: Mac and 4 Grade View: Grade II Tube type: Oral Tube size: 7.5 mm Number of attempts: 1 Airway Equipment and Method: Stylet Placement Confirmation: ETT inserted through vocal cords under direct vision,  positive ETCO2 and breath sounds checked- equal and bilateral Secured at: 21 cm Tube secured with: Tape Dental Injury: Teeth and Oropharynx as per pre-operative assessment

## 2016-02-29 NOTE — Brief Op Note (Signed)
02/29/2016  10:34 AM  PATIENT:  Milon Score  65 y.o. male  PRE-OPERATIVE DIAGNOSIS:  RIGHT RENAL MASS  POST-OPERATIVE DIAGNOSIS:  RIGHT RENAL MASS  PROCEDURE:  Procedure(s) with comments: XI ROBOTIC ASSITED PARTIAL NEPHRECTOMY (Right) -  NEEDS ULTRASOUND  SURGEON:  Surgeon(s) and Role:    * Alexis Frock, MD - Primary  PHYSICIAN ASSISTANT:   ASSISTANTS: Debbrah Alar, PA   ANESTHESIA:   local and general  EBL:  Total I/O In: 2000 [I.V.:2000] Out: 350 [Urine:350]  BLOOD ADMINISTERED:none  DRAINS: 1 - JP to bulb, 2 - Foley to gravity   LOCAL MEDICATIONS USED:  MARCAINE     SPECIMEN:  Source of Specimen:  1 - Rt anterior renal mass, 2 - Rt lateral renal mass, 3 - Base of Rt lateral renal mass  DISPOSITION OF SPECIMEN:  PATHOLOGY  COUNTS:  YES  TOURNIQUET:  * No tourniquets in log *  DICTATION: .Other Dictation: Dictation Number [id number]  PLAN OF CARE: Admit to inpatient   PATIENT DISPOSITION:  PACU - hemodynamically stable.   Delay start of Pharmacological VTE agent (>24hrs) due to surgical blood loss or risk of bleeding: yes

## 2016-02-29 NOTE — Progress Notes (Signed)
Received pt from PACU, alert and oriented, VS obtained, oriented to unit, call light in reach

## 2016-02-29 NOTE — Transfer of Care (Signed)
Immediate Anesthesia Transfer of Care Note  Patient: Max [name]  Procedure(s) Performed: Procedure(s) with comments: XI ROBOTIC ASSITED PARTIAL NEPHRECTOMY (Right) -  NEEDS ULTRASOUND  Patient Location: PACU  Anesthesia Type:General  Level of Consciousness:  sedated, patient cooperative and responds to stimulation  Airway & Oxygen Therapy:Patient Spontanous Breathing and Patient connected to face mask oxgen  Post-op Assessment:  Report given to PACU RN and Post -op Vital signs reviewed and stable  Post vital signs:  Reviewed and stable  Last Vitals:  Filed Vitals:   02/29/16 0526  BP: 153/82  Pulse: 78  Temp: 36.6 C  Resp: 18    Complications: No apparent anesthesia complications

## 2016-02-29 NOTE — Anesthesia Preprocedure Evaluation (Signed)
Anesthesia Evaluation  Patient identified by MRN, date of birth, ID band Patient awake    Reviewed: Allergy & Precautions, NPO status , Patient's Chart, lab work & pertinent test results  Airway Mallampati: II  TM Distance: >3 FB Neck ROM: Full    Dental no notable dental hx.    Pulmonary neg pulmonary ROS,    Pulmonary exam normal breath sounds clear to auscultation       Cardiovascular Exercise Tolerance: Good hypertension, Pt. on medications Normal cardiovascular exam Rhythm:Regular Rate:Normal     Neuro/Psych Seizures -, Well Controlled,  Anxiety    GI/Hepatic negative GI ROS, Neg liver ROS,   Endo/Other  negative endocrine ROS  Renal/GU negative Renal ROS  negative genitourinary   Musculoskeletal  (+) Arthritis , Rheumatoid disorders,    Abdominal   Peds negative pediatric ROS (+)  Hematology negative hematology ROS (+)   Anesthesia Other Findings   Reproductive/Obstetrics negative OB ROS                             Anesthesia Physical Anesthesia Plan  ASA: II  Anesthesia Plan: General   Post-op Pain Management:    Induction: Intravenous  Airway Management Planned: Oral ETT  Additional Equipment:   Intra-op Plan:   Post-operative Plan: Extubation in OR  Informed Consent: I have reviewed the patients History and Physical, chart, labs and discussed the procedure including the risks, benefits and alternatives for the proposed anesthesia with the patient or authorized representative who has indicated his/her understanding and acceptance.   Dental advisory given  Plan Discussed with: CRNA  Anesthesia Plan Comments:         Anesthesia Quick Evaluation

## 2016-03-01 DIAGNOSIS — D3001 Benign neoplasm of right kidney: Secondary | ICD-10-CM | POA: Diagnosis not present

## 2016-03-01 LAB — HEMOGLOBIN AND HEMATOCRIT, BLOOD
HEMATOCRIT: 40.5 % (ref 39.0–52.0)
Hemoglobin: 13.3 g/dL (ref 13.0–17.0)

## 2016-03-01 LAB — BASIC METABOLIC PANEL
Anion gap: 8 (ref 5–15)
BUN: 13 mg/dL (ref 6–20)
CALCIUM: 8.7 mg/dL — AB (ref 8.9–10.3)
CO2: 26 mmol/L (ref 22–32)
CREATININE: 0.97 mg/dL (ref 0.61–1.24)
Chloride: 105 mmol/L (ref 101–111)
GFR calc non Af Amer: >60 mL/min (ref >60)
Glucose, Bld: 130 mg/dL — ABNORMAL HIGH (ref 65–99)
Potassium: 4.3 mmol/L (ref 3.5–5.1)
SODIUM: 139 mmol/L (ref 135–145)

## 2016-03-01 LAB — CREATININE, FLUID (PLEURAL, PERITONEAL, JP DRAINAGE): CREAT FL: 1.1 mg/dL

## 2016-03-01 NOTE — Progress Notes (Signed)
Urology Progress Note  1 Day Post-Op ROBOTIC ASSITED PARTIAL NEPHRECTOMY (Right) ( Dr. Tresa Moore)  Subjective:     No acute urologic events overnight. Ambulation:   positive Flatus:    positive Bowel movement  negative  Pain: some relief  Objective:  Blood pressure 128/57, pulse 71, temperature 98.1 F (36.7 C), temperature source Oral, resp. rate 18, height 6' (1.829 m), weight 92.987 kg (205 lb), SpO2 94 %.  Physical Exam:  General:  No acute distress, awake Resp: clear to auscultation bilaterally Genitourinary:  neg Foley: out    I/O last 3 completed shifts: In: 5662.9 [P.O.:890; I.V.:4772.9] Out: 4470 [Urine:4175; Drains:195; Blood:100]  Recent Labs     02/29/16  1119  03/01/16  0548  HGB  14.0  13.3    Recent Labs     03/01/16  0548  NA  139  K  4.3  CL  105  CO2  26  BUN  13  CREATININE  0.97  CALCIUM  8.7*  GFRNONAA  >60  GFRAA  >60     No results for input(s): INR, APTT in the last 72 hours.  Invalid input(s): PT   Invalid input(s): ABG  Assessment/Plan:  Continue any current medications. Ck JP and remove. Possible d/c this afternoon or tomorrow.

## 2016-03-02 NOTE — Discharge Instructions (Signed)

## 2016-03-02 NOTE — Progress Notes (Signed)
Urology Progress Note  2 Days Post-Op ROBOTIC ASSITED PARTIAL NEPHRECTOMY (Right) ( Dr. Tresa Moore)  Subjective:AF, VSS, foley out, JP out.      No acute urologic events overnight. Ambulation:   positive Flatus:    positive Bowel movement  positive  Pain: complete resolution  Objective:  Blood pressure 151/76, pulse 88, temperature 99.9 F (37.7 C), temperature source Oral, resp. rate 18, height 6' (1.829 m), weight 92.987 kg (205 lb), SpO2 94 %.  Physical Exam:  General:  No acute distress, awake  Genitourinary:   normal Foley: out    I/O last 3 completed shifts: In: 2064.6 [P.O.:600; I.V.:1464.6] Out: 8208 [HNGIT:1959; Drains:80; Other:750]  Recent Labs     02/29/16  1119  03/01/16  0548  HGB  14.0  13.3    Recent Labs     03/01/16  0548  NA  139  K  4.3  CL  105  CO2  26  BUN  13  CREATININE  0.97  CALCIUM  8.7*  GFRNONAA  >60  GFRAA  >60     No results for input(s): INR, APTT in the last 72 hours.  Invalid input(s): PT   Invalid input(s): ABG  Assessment/Plan:  Catheter removed. JP out

## 2016-03-13 ENCOUNTER — Encounter: Payer: Self-pay | Admitting: *Deleted

## 2016-03-13 DIAGNOSIS — D3001 Benign neoplasm of right kidney: Secondary | ICD-10-CM | POA: Diagnosis not present

## 2016-03-13 DIAGNOSIS — Z0000 Encounter for general adult medical examination without abnormal findings: Secondary | ICD-10-CM | POA: Diagnosis not present

## 2016-03-14 ENCOUNTER — Encounter: Payer: Self-pay | Admitting: Pulmonary Disease

## 2016-03-14 ENCOUNTER — Ambulatory Visit (INDEPENDENT_AMBULATORY_CARE_PROVIDER_SITE_OTHER): Payer: Medicare Other | Admitting: Pulmonary Disease

## 2016-03-14 VITALS — BP 148/86 | HR 57 | Ht 72.0 in | Wt 205.0 lb

## 2016-03-14 DIAGNOSIS — R918 Other nonspecific abnormal finding of lung field: Secondary | ICD-10-CM | POA: Diagnosis not present

## 2016-03-14 NOTE — Progress Notes (Signed)
Subjective:    Patient ID: Max [name], male    DOB: 12-17-1950, 65 y.o.   MRN: [id number]  HPI  Consult for evaluation of lung nodules.  Mr. Butrick is a 65 year old with past medical history from rheumatoid arthritis, atrial fibrillation, meningioma. He has an incidental finding of lung nodule found on abdominal imaging during workup of a renal lesion. He underwent a partial nephrectomy on 02/29/16 which showed a benign lesion. He has history of meningioma resection in 2012 and rheumatoid arthritis. He's been on methotrexate since 2010 and his arthritis symptoms appear to be well controlled.  He is a never smoker with no known exposures. He has lived in [place] all his life except for brief vacations in other parts of the country. He is self-employed as an Web designer.  DATA: CT chest 02/13/16 4 mm RUL nodule, 1.4 X 1.6 cm RLL nodule  CT abd 01/08/16 RLL lung nodule. 18 mm lesion in rt kidney.   Path Kidney resection 3/17 - Negative for malignancy Brain path 05/16/11 - Meningioma.  Social History: He is a never smoker and alcohol, drug use.  Family History: Emphysema-mother Prostate cancer-brother  Past Medical History  Diagnosis Date  . Allergic rhinitis   . Hypertension   . Herpes   . ED (erectile dysfunction)   . RA (rheumatoid arthritis) ([place])   . Vasovagal syncope   . Paroxysmal atrial fibrillation ([facility]) 07/15/2012    a) CHADSVASc score 1b) On full-dose ASA and Verapamil  . History of syncope     in the setting of anxiety per cardiology note  . Anxiety   . Meningioma ([place])     had surgery   . Seizures ([place]) 05/14/2011    "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   . Cardiac arrhythmia     Current outpatient prescriptions:  .  acyclovir (ZOVIRAX) 800 MG tablet, Take 1 tablet (800 mg total) by mouth 3 (three) times daily. (Patient taking differently: Take 800 mg by mouth 3 (three) times daily as needed. For outbreaks), Disp: 60 tablet, Rfl: 1 .   clindamycin (CLEOCIN) 300 MG capsule, Take 1 capsule by mouth 3 (three) times daily., Disp: , Rfl:  .  methotrexate (RHEUMATREX) 2.5 MG tablet, TAKE 4 TABLETS BY MOUTH WEEKLY (Patient taking differently: Take 10 mg by mouth once a week. ), Disp: 16 tablet, Rfl: 5 .  omeprazole (PRILOSEC) 20 MG capsule, Take 1 capsule (20 mg total) by mouth daily., Disp: 100 capsule, Rfl: 3 .  sildenafil (VIAGRA) 100 MG tablet, Take 1 tablet (100 mg total) by mouth as needed for erectile dysfunction. Use as directed (Patient taking differently: Take 100 mg by mouth daily as needed for erectile dysfunction. Use as directed), Disp: 10 tablet, Rfl: 11 .  verapamil (CALAN-SR) 180 MG CR tablet, Take 1 tablet (180 mg total) by mouth daily., Disp: 90 tablet, Rfl: 3  Review of Systems  Constitutional: Negative for fever and unexpected weight change.  HENT: Negative for congestion, dental problem, ear pain, nosebleeds, postnasal drip, rhinorrhea, sinus pressure, sneezing, sore throat and trouble swallowing.   Eyes: Negative for redness and itching.  Respiratory: Negative for cough, chest tightness, shortness of breath and wheezing.   Cardiovascular: Negative for palpitations and leg swelling.  Gastrointestinal: Negative for nausea and vomiting.  Genitourinary: Negative for dysuria.  Musculoskeletal: Negative for joint swelling.  Skin: Negative for rash.  Neurological: Negative for headaches.  Hematological: Does not bruise/bleed easily.  Psychiatric/Behavioral: Negative for dysphoric mood. The  patient is not nervous/anxious.       Objective:   Physical Exam Blood pressure 148/86, pulse 57, height 6' (1.829 m), weight 205 lb (92.987 kg), SpO2 95 %. Gen: No apparent distress Neuro: No gross focal deficits. HEENT: No JVD, lymphadenopathy, thyromegaly. RS: Clear, No wheeze or crackles CVS: S1-S2 heard, no murmurs rubs gallops. Abdomen: Soft, positive bowel sounds. Musculoskeletal: No edema.    Assessment & Plan:    Lung nodules  Needs evaluation for malignancy. He does have history from rheumatoid arthritis and his lung nodules may be rheumatoid nodules. He does not have any risk factors such as smoking or exposures to asbestos. Review of the imaging shows that the predominant nodule has remained stable from January 2017.  I will evaluate with a PET scan. Rheumatoid nodules can be positive on PET imaging. However as his arthritis symptoms are well controlled on methotrexate he may have a negative study. I'll also get pulmonary function tests in case he needs a resection of this nodule.  Plan: - PET scan - PFTs.  Return to clinic in 1 month.  Marshell Garfinkel MD [facility] Pager [phone] If no answer or after 3pm call: [phone] 03/17/2016, 10:11 AM

## 2016-03-17 NOTE — Patient Instructions (Signed)
Scheduled for a PET scan and PFTs. Return to clinic in 1 month.

## 2016-03-18 ENCOUNTER — Other Ambulatory Visit: Payer: BLUE CROSS/BLUE SHIELD

## 2016-03-18 NOTE — Discharge Summary (Signed)
Physician Discharge Summary  Patient ID: Max [name] MRN: [id number] DOB/AGE: [birth year] 65 y.o.  Admit date: 02/29/2016 Discharge date: 03/02/2016  Admission Diagnoses: RIGHT renal mass    Discharge Diagnoses:  Active Problems:   Renal mass Stage T1a oncocytoma  Discharged Condition: good  Hospital Course:   1 - Right Renal Oncocytoma - pt underwent uncomplicated right robotic partial nephrecotmy of two small masses on 3/17, the day of admission withotu acute complications. He was admitted to the Urology service post-op. Final pathology confirms pT1a oncocytoma with negative margins. He was rounded on 3/18 and 3/19 by Dr. Gaynelle Arabian who found him adequate for discharge on 3/19 as documented in his progress note dated 3/19.    Consults: None  Significant Diagnostic Studies: labs:  As per above  Treatments: surgery: *ight robotic partial nephrecotmy of two small masses on 3/1  Discharge Exam: Blood pressure 151/76, pulse 88, temperature 99.9 F (37.7 C), temperature source Oral, resp. rate 18, height 6' (1.829 m), weight 92.987 kg (205 lb), SpO2 94 %.  Please see exam documented on day of discharge by Dr. Gaynelle Arabian 3/19.   Disposition: 01-Home or Self Care  Discharge Instructions    Discharge patient    Complete by:  As directed      Discontinue IV    Complete by:  As directed             Medication List    STOP taking these medications        aspirin 81 MG tablet     CENTRUM SILVER ULTRA MENS PO     folic acid 017 MCG tablet  Commonly known as:  FOLVITE      TAKE these medications        acyclovir 800 MG tablet  Commonly known as:  ZOVIRAX  Take 1 tablet (800 mg total) by mouth 3 (three) times daily.     methotrexate 2.5 MG tablet  Commonly known as:  RHEUMATREX  TAKE 4 TABLETS BY MOUTH WEEKLY     omeprazole 20 MG capsule  Commonly known as:  PRILOSEC  Take 1 capsule (20 mg total) by mouth daily.     sildenafil 100 MG tablet  Commonly known  as:  VIAGRA  Take 1 tablet (100 mg total) by mouth as needed for erectile dysfunction. Use as directed     verapamil 180 MG CR tablet  Commonly known as:  CALAN-SR  Take 1 tablet (180 mg total) by mouth daily.           Follow-up Information    Follow up with Alexis Frock, MD On 03/13/2016.   Specialty:  Urology   Why:  at 10:15 for MD visit. Dr. Tresa Moore will call you with pathology results when available.    Contact information:   [place] [address] [phone]       Follow up with Alexis Frock, MD.   Specialty:  Urology   Contact information:   [place] [address] [phone]       Signed: Alexis Frock 03/18/2016, 9:14 AM

## 2016-03-25 ENCOUNTER — Ambulatory Visit (INDEPENDENT_AMBULATORY_CARE_PROVIDER_SITE_OTHER): Payer: Medicare Other | Admitting: Family Medicine

## 2016-03-25 ENCOUNTER — Encounter: Payer: Self-pay | Admitting: Family Medicine

## 2016-03-25 VITALS — BP 140/90 | Temp 98.3°F | Ht 72.0 in | Wt 202.0 lb

## 2016-03-25 DIAGNOSIS — N529 Male erectile dysfunction, unspecified: Secondary | ICD-10-CM

## 2016-03-25 DIAGNOSIS — I10 Essential (primary) hypertension: Secondary | ICD-10-CM | POA: Diagnosis not present

## 2016-03-25 DIAGNOSIS — Z0000 Encounter for general adult medical examination without abnormal findings: Secondary | ICD-10-CM

## 2016-03-25 DIAGNOSIS — M06041 Rheumatoid arthritis without rheumatoid factor, right hand: Secondary | ICD-10-CM | POA: Diagnosis not present

## 2016-03-25 DIAGNOSIS — B009 Herpesviral infection, unspecified: Secondary | ICD-10-CM | POA: Diagnosis not present

## 2016-03-25 DIAGNOSIS — M06042 Rheumatoid arthritis without rheumatoid factor, left hand: Secondary | ICD-10-CM

## 2016-03-25 MED ORDER — OMEPRAZOLE 20 MG PO CPDR: 20.0000 mg | DELAYED_RELEASE_CAPSULE | Freq: Every day | ORAL | Status: DC

## 2016-03-25 MED ORDER — SILDENAFIL CITRATE 20 MG PO TABS: ORAL_TABLET | ORAL | Status: DC

## 2016-03-25 MED ORDER — VERAPAMIL HCL ER 180 MG PO TBCR: 180.0000 mg | EXTENDED_RELEASE_TABLET | Freq: Every day | ORAL | Status: DC

## 2016-03-25 MED ORDER — SILDENAFIL CITRATE 100 MG PO TABS: 100.0000 mg | ORAL_TABLET | ORAL | Status: DC | PRN

## 2016-03-25 MED ORDER — ACYCLOVIR 800 MG PO TABS: 800.0000 mg | ORAL_TABLET | Freq: Three times a day (TID) | ORAL | Status: DC | PRN

## 2016-03-25 NOTE — Progress Notes (Signed)
Subjective:    Patient ID: Max [name], male    DOB: 10-30-1951, 65 y.o.   MRN: [id number]  HPI Max [name] is a 65 year old married male nonsmoker who comes in today for general physical examination because of a history of rheumatoid arthritis, reflux esophagitis, hypertension, erectile dysfunction, and recent surgery for removal of 10% of his right kidney. It appeared to be a malignancy for sleep with turned out to be benign. He's also going back for follow-up PET scan this coming Thursday because of a pulmonary nodule  His blood pressures 140/90 at home is 130/80. He takes Kalynn 180 mg daily  He uses Prilosec 20 mg daily for chronic reflux  He takes methotrexate 2.5 mg dose for tabs weekly for rheumatoid arthritis. He's doing well and has minimal joint pain on the methotrexate.  He gets routine eye care, dental care, colonoscopy 2014 normal  Vaccinations up-to-date  Cognitive function normal he walks daily home health safety reviewed no issues identified, no guns in the house, he does have a healthcare power of attorney and living well.  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   HENT: Negative.   Eyes: Negative.   Respiratory: Negative.   Cardiovascular: Negative.   Gastrointestinal: Negative.   Endocrine: Negative.   Genitourinary: Negative.   Musculoskeletal: Negative.   Skin: Negative.   Allergic/Immunologic: Negative.   Neurological: Negative.   Hematological: Negative.   Psychiatric/Behavioral: Negative.        Objective:   Physical Exam  Constitutional: He is oriented to person, place, and time. He appears well-developed and well-nourished.  HENT:  Head: Normocephalic and atraumatic.  Right Ear: External ear normal.  Left Ear: External ear normal.  Nose: Nose normal.  Mouth/Throat: Oropharynx is clear and moist.  Eyes: Conjunctivae and EOM are  normal. Pupils are equal, round, and reactive to light.  Neck: Normal range of motion. Neck supple. No JVD present. No tracheal deviation present. No thyromegaly present.  Cardiovascular: Normal rate, regular rhythm, normal heart sounds and intact distal pulses.  Exam reveals no gallop and no friction rub.   No murmur heard. Pulmonary/Chest: Effort normal and breath sounds normal. No stridor. No respiratory distress. He has no wheezes. He has no rales. He exhibits no tenderness.  Abdominal: Soft. Bowel sounds are normal. He exhibits no distension and no mass. There is no tenderness. There is no rebound and no guarding.  Genitourinary:  Genitourinary exam done by urologist therefore not repeated  Musculoskeletal: Normal range of motion. He exhibits no edema or tenderness.  Lymphadenopathy:    He has no cervical adenopathy.  Neurological: He is alert and oriented to person, place, and time. He has normal reflexes. No cranial nerve deficit. He exhibits normal muscle tone.  Skin: Skin is warm and dry. No rash noted. No erythema. No pallor.  Total body skin exam normal except for stab wounds anterior abdomen from previous laparoscopic surgery. He has one area 3 inches above his umbilicus in the midline that's slow to heal.  Psychiatric: He has a normal mood and affect. His behavior is normal. Judgment and thought content normal.  Nursing note and vitals reviewed.         Assessment & PlanHealthy male  Hypertension at goal.............. continue current therapy  History of rheumatoid arthritis................. continue methotrexate  History of chronic reflux esophagitis........... continue Prilosec  Renal mass suspicious of malignancy however upon removal  was benign  Pulmonary nodule.......... PET scan this Thursday:

## 2016-03-25 NOTE — Patient Instructions (Addendum)
Continue current medications  The Viagra can be purchased[place][email or web address]  The generic Viagra is about a buccal pill  Return in one year for general physical exam sooner if any problems  Walk 30 minutes daily

## 2016-03-25 NOTE — Progress Notes (Signed)
Pre visit review using our clinic review tool, if applicable. No additional management support is needed unless otherwise documented below in the visit note. 

## 2016-03-27 ENCOUNTER — Ambulatory Visit (HOSPITAL_COMMUNITY)
Admission: RE | Admit: 2016-03-27 | Discharge: 2016-03-27 | Disposition: A | Discharge status: Home / Self Care | Payer: Medicare Other | Source: Ambulatory Visit | Attending: Pulmonary Disease | Admitting: Pulmonary Disease

## 2016-03-27 ENCOUNTER — Telehealth: Payer: Self-pay | Admitting: Internal Medicine

## 2016-03-27 DIAGNOSIS — R918 Other nonspecific abnormal finding of lung field: Secondary | ICD-10-CM

## 2016-03-27 DIAGNOSIS — Z905 Acquired absence of kidney: Secondary | ICD-10-CM | POA: Diagnosis not present

## 2016-03-27 DIAGNOSIS — I700 Atherosclerosis of aorta: Secondary | ICD-10-CM | POA: Insufficient documentation

## 2016-03-27 DIAGNOSIS — R911 Solitary pulmonary nodule: Secondary | ICD-10-CM

## 2016-03-27 DIAGNOSIS — C3431 Malignant neoplasm of lower lobe, right bronchus or lung: Secondary | ICD-10-CM | POA: Diagnosis not present

## 2016-03-27 LAB — PULMONARY FUNCTION TEST
DL/VA % pred: 97 %
DL/VA: 4.59 ml/min/mmHg/L
DLCO unc % pred: 74 %
DLCO unc: 25.97 ml/min/mmHg
FEF 25-75 Post: 2.52 L/sec
FEF 25-75 Pre: 1.92 L/sec
FEF2575-%Change-Post: 31 %
FEF2575-%PRED-PRE: 66 %
FEF2575-%Pred-Post: 87 %
FEV1-%Change-Post: 7 %
FEV1-%PRED-POST: 75 %
FEV1-%PRED-PRE: 69 %
FEV1-PRE: 2.57 L
FEV1-Post: 2.77 L
FEV1FVC-%Change-Post: 2 %
FEV1FVC-%Pred-Pre: 98 %
FEV6-%Change-Post: 3 %
FEV6-%PRED-POST: 76 %
FEV6-%PRED-PRE: 73 %
FEV6-POST: 3.57 L
FEV6-Pre: 3.45 L
FEV6FVC-%Change-Post: -1 %
FEV6FVC-%PRED-POST: 102 %
FEV6FVC-%Pred-Pre: 104 %
FVC-%Change-Post: 5 %
FVC-%PRED-PRE: 70 %
FVC-%Pred-Post: 74 %
FVC-POST: 3.66 L
FVC-PRE: 3.48 L
POST FEV6/FVC RATIO: 97 %
PRE FEV1/FVC RATIO: 74 %
Post FEV1/FVC ratio: 76 %
Pre FEV6/FVC Ratio: 99 %
RV % pred: 104 %
RV: 2.59 L
TLC % PRED: 82 %
TLC: 6.11 L

## 2016-03-27 LAB — GLUCOSE, CAPILLARY: GLUCOSE-CAPILLARY: 98 mg/dL (ref 65–99)

## 2016-03-27 MED ORDER — FLUDEOXYGLUCOSE F - 18 (FDG) INJECTION
9.8000 | Freq: Once | INTRAVENOUS | Status: AC | PRN
  Administered 2016-03-27: 9.8 via INTRAVENOUS

## 2016-03-27 MED ORDER — ALBUTEROL SULFATE (2.5 MG/3ML) 0.083% IN NEBU
2.5000 mg | INHALATION_SOLUTION | Freq: Once | RESPIRATORY_TRACT | Status: AC
  Administered 2016-03-27: 2.5 mg via RESPIRATORY_TRACT

## 2016-03-27 NOTE — Telephone Encounter (Signed)
Called and spoke to pt's wife, Max [name]. Max [name] is requesting the results of the PET scan that was done on 4.13.17. Beecher Mcardle that once they are resulted by PM then we can advise on the results. Pt's wife aware that this will likely not be before the weekend, Max [name] verbalized understanding.   Dr. Vaughan Browner please advise on PET scan results. Thanks.

## 2016-03-28 NOTE — Telephone Encounter (Signed)
I called and got the voice mail. I left a voice message. Will try again on Monday.

## 2016-04-01 NOTE — Telephone Encounter (Signed)
Dr. Vaughan Browner, would you like CT Chest with or without contrast?

## 2016-04-01 NOTE — Telephone Encounter (Signed)
PM please advise when you've spoken to patient, so message can be closed. Thanks!

## 2016-04-01 NOTE — Telephone Encounter (Signed)
Yes. I spoke to Fraser Din the pt's wife yesterday. We discussed the results of the scan. Plan is to follow up with a repeat CT scan in 3 months. Please order the scan.

## 2016-04-02 NOTE — Telephone Encounter (Signed)
CT without contrast. Thanks

## 2016-04-02 NOTE — Telephone Encounter (Signed)
CT ordered. 

## 2016-04-15 ENCOUNTER — Encounter: Payer: Self-pay | Admitting: Pulmonary Disease

## 2016-04-15 ENCOUNTER — Ambulatory Visit (INDEPENDENT_AMBULATORY_CARE_PROVIDER_SITE_OTHER): Payer: Medicare Other | Admitting: Pulmonary Disease

## 2016-04-15 VITALS — BP 158/70 | HR 69 | Ht 72.0 in | Wt 207.8 lb

## 2016-04-15 DIAGNOSIS — R911 Solitary pulmonary nodule: Secondary | ICD-10-CM

## 2016-04-15 NOTE — Patient Instructions (Signed)
Please let Max [name] know when her abdomen CT is scheduled. We will add a CT of the chest at that time.  Return to clinic in 3 months to discuss results.

## 2016-04-15 NOTE — Progress Notes (Signed)
Subjective:    Patient ID: Max [name], male    DOB: [birth year], 65 y.o.   MRN: [id number]  HPI  Follow up evaluation of lung nodules.  Max [name] is a 65 year old with past medical history from rheumatoid arthritis, atrial fibrillation, meningioma. He has an incidental finding of lung nodule found on abdominal imaging during workup of a renal lesion. He underwent a partial nephrectomy on 02/29/16 which showed a benign lesion. He has history of meningioma resection in 2012 and rheumatoid arthritis. He's been on methotrexate since 2010 and his arthritis symptoms appear to be well controlled. He had a PET scan that shows low uptake.   He is a never smoker with no known exposures. He has lived in [place] all his life except for brief vacations in other parts of the country. He is self-employed as an Web designer.  DATA: PFTs  03/27/16 FVC 2.48 (70%) FEV1 2.57 [90%) F/F 74  TLC 82% DLCO 74%. Minimal obstructive disease as shown by flow loop. Minimal diffusion defect that corrects for alveolar volume.  PET scan 03/27/16 1. There is low level malignant range FDG uptake associated with the solid nodule in the right lower lobe. Although the degree of FDG uptake is less than what one would expect for a nodule of this size, a low-grade indolent neoplasm such as carcinoid cannot be excluded. 2. Postsurgical changes noted from partial nephrectomy involving the right kidney. 3. Aortic atherosclerosis.  CT chest 02/13/16 4 mm RUL nodule, 1.4 X 1.6 cm RLL nodule  CT abd 01/08/16 RLL lung nodule. 18 mm lesion in rt kidney.   Path Kidney resection 3/17 - Negative for malignancy Brain path 05/16/11 - Meningioma.  Social History: He is a never smoker and alcohol, drug use.  Family History: Emphysema-mother Prostate cancer-brother  Past Medical History  Diagnosis Date  . Allergic rhinitis   . Hypertension   . Herpes   . ED (erectile dysfunction)   . RA (rheumatoid  arthritis) ([place])   . Vasovagal syncope   . Paroxysmal atrial fibrillation ([facility]) 07/15/2012    a) CHADSVASc score 1b) On full-dose ASA and Verapamil  . History of syncope     in the setting of anxiety per cardiology note  . Anxiety   . Meningioma ([place])     had surgery   . Seizures ([place]) 05/14/2011    "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   . Cardiac arrhythmia     Current outpatient prescriptions:  .  acyclovir (ZOVIRAX) 800 MG tablet, Take 1 tablet (800 mg total) by mouth 3 (three) times daily as needed. For outbreaks, Disp: 60 tablet, Rfl: 1 .  methotrexate (RHEUMATREX) 2.5 MG tablet, TAKE 4 TABLETS BY MOUTH WEEKLY (Patient taking differently: Take 10 mg by mouth once a week. ), Disp: 16 tablet, Rfl: 5 .  omeprazole (PRILOSEC) 20 MG capsule, Take 1 capsule (20 mg total) by mouth daily., Disp: 100 capsule, Rfl: 4 .  sildenafil (REVATIO) 20 MG tablet, Use as directed, Disp: 20 tablet, Rfl: 10 .  sildenafil (VIAGRA) 100 MG tablet, Take 1 tablet (100 mg total) by mouth as needed for erectile dysfunction. Use as directed, Disp: 10 tablet, Rfl: 11 .  verapamil (CALAN-SR) 180 MG CR tablet, Take 1 tablet (180 mg total) by mouth daily., Disp: 90 tablet, Rfl: 4  Review of Systems  Constitutional: Negative for fever and unexpected weight change.  HENT: Negative for congestion, dental problem, ear pain, nosebleeds, postnasal drip, rhinorrhea, sinus pressure, sneezing,  sore throat and trouble swallowing.   Eyes: Negative for redness and itching.  Respiratory: Negative for cough, chest tightness, shortness of breath and wheezing.   Cardiovascular: Negative for palpitations and leg swelling.  Gastrointestinal: Negative for nausea and vomiting.  Genitourinary: Negative for dysuria.  Musculoskeletal: Negative for joint swelling.  Skin: Negative for rash.  Neurological: Negative for headaches.  Hematological: Does not bruise/bleed easily.  Psychiatric/Behavioral: Negative for dysphoric mood. The  patient is not nervous/anxious.       Objective:   Physical Exam Blood pressure 158/70, pulse 69, height 6' (1.829 m), weight 207 lb 12.8 oz (94.257 kg), SpO2 95 %. Gen: No apparent distress Neuro: No gross focal deficits. HEENT: No JVD, lymphadenopathy, thyromegaly. RS: Clear, No wheeze or crackles CVS: S1-S2 heard, no murmurs rubs gallops. Abdomen: Soft, positive bowel sounds. Musculoskeletal: No edema.    Assessment & Plan:  Lung nodules I believe that these may be rheumatoid nodules. He does not have any risk factors such as smoking or exposures to asbestos. Review of the imaging shows that the predominant nodule may actually have become smaller from January 2017. PET scan shows mild uptake which is also consistent with rheumatoid nodules as they can be positive on PET imaging. PFTs were reviewed with the patient and his wife today. They show only minimal changes.  He is scheduled for CT of the abdomen as a follow-up after his nephrectomy in a few months. He is to get a CT of the abdomen as a follow-up after his nephrectomy in a few months. He will let us know when this is scheduled and will add a CT of the chest to follow-up on the lung nodules   Plan: - Follow up CT of the chest  Return to clinic in 3 month.  Marshell Garfinkel MD [facility] Pager [phone] If no answer or after 3pm call: [phone] 04/15/2016, 9:37 AM

## 2016-04-16 ENCOUNTER — Telehealth: Payer: Self-pay | Admitting: Pulmonary Disease

## 2016-04-16 DIAGNOSIS — R911 Solitary pulmonary nodule: Secondary | ICD-10-CM

## 2016-04-16 NOTE — Telephone Encounter (Signed)
Spoke with pt's wife.  Dr Tresa Moore does not want abdomen CT until 6 mths from now.  Will need to go ahead and order chest ct now if Dr Vaughan Browner wants this sooner.  Please advise on order

## 2016-04-17 NOTE — Telephone Encounter (Signed)
Order has been placed. Pt's wife is aware. Nothing further was needed.

## 2016-04-17 NOTE — Telephone Encounter (Signed)
Please order a CT without contrast for mid July. Thanks

## 2016-06-25 ENCOUNTER — Ambulatory Visit (INDEPENDENT_AMBULATORY_CARE_PROVIDER_SITE_OTHER)
Admission: RE | Admit: 2016-06-25 | Discharge: 2016-06-25 | Disposition: A | Discharge status: Home / Self Care | Payer: Medicare Other | Source: Ambulatory Visit | Attending: Pulmonary Disease | Admitting: Pulmonary Disease

## 2016-06-25 DIAGNOSIS — R911 Solitary pulmonary nodule: Secondary | ICD-10-CM

## 2016-06-27 ENCOUNTER — Other Ambulatory Visit: Payer: Self-pay | Admitting: Pulmonary Disease

## 2016-06-27 DIAGNOSIS — R911 Solitary pulmonary nodule: Secondary | ICD-10-CM

## 2016-06-27 NOTE — Progress Notes (Signed)
Quick Note:  Called spoke with patient, advised of CT results / recs as stated by PM. Pt verbalized his understanding and denied any questions. Orders only encounter created for repeat CT in 6 months. ______

## 2016-07-02 ENCOUNTER — Telehealth: Payer: Self-pay | Admitting: Pulmonary Disease

## 2016-07-02 NOTE — Telephone Encounter (Signed)
Yes. It is OK to cancel. Please make a return appointment after the follow up CT scan.

## 2016-07-02 NOTE — Telephone Encounter (Signed)
Spoke with pt's wife and advised ok to cancel current appt and reschedule for after repeat CT scan. Nothing further needed.

## 2016-07-02 NOTE — Telephone Encounter (Signed)
Spoke with pt spouse. She reports they were called regarding pt CT results recently. They are wanting to know if they can cancel the august appt? Please advise Dr. Vaughan Browner. thanks

## 2016-07-24 DIAGNOSIS — M546 Pain in thoracic spine: Secondary | ICD-10-CM | POA: Diagnosis not present

## 2016-07-28 ENCOUNTER — Ambulatory Visit: Payer: Medicare Other | Admitting: Pulmonary Disease

## 2016-09-01 DIAGNOSIS — M546 Pain in thoracic spine: Secondary | ICD-10-CM | POA: Diagnosis not present

## 2016-09-08 DIAGNOSIS — N281 Cyst of kidney, acquired: Secondary | ICD-10-CM | POA: Diagnosis not present

## 2016-09-08 DIAGNOSIS — D3001 Benign neoplasm of right kidney: Secondary | ICD-10-CM | POA: Diagnosis not present

## 2016-09-10 DIAGNOSIS — M546 Pain in thoracic spine: Secondary | ICD-10-CM | POA: Diagnosis not present

## 2016-09-15 DIAGNOSIS — Z125 Encounter for screening for malignant neoplasm of prostate: Secondary | ICD-10-CM | POA: Diagnosis not present

## 2016-09-15 DIAGNOSIS — D3001 Benign neoplasm of right kidney: Secondary | ICD-10-CM | POA: Diagnosis not present

## 2016-09-15 DIAGNOSIS — R918 Other nonspecific abnormal finding of lung field: Secondary | ICD-10-CM | POA: Diagnosis not present

## 2016-09-16 DIAGNOSIS — M545 Low back pain: Secondary | ICD-10-CM | POA: Diagnosis not present

## 2016-09-16 DIAGNOSIS — M546 Pain in thoracic spine: Secondary | ICD-10-CM | POA: Diagnosis not present

## 2016-11-03 DIAGNOSIS — Z961 Presence of intraocular lens: Secondary | ICD-10-CM | POA: Diagnosis not present

## 2016-11-03 DIAGNOSIS — H35363 Drusen (degenerative) of macula, bilateral: Secondary | ICD-10-CM | POA: Diagnosis not present

## 2016-11-03 DIAGNOSIS — H25012 Cortical age-related cataract, left eye: Secondary | ICD-10-CM | POA: Diagnosis not present

## 2016-11-03 DIAGNOSIS — H2512 Age-related nuclear cataract, left eye: Secondary | ICD-10-CM | POA: Diagnosis not present

## 2016-11-03 DIAGNOSIS — H35371 Puckering of macula, right eye: Secondary | ICD-10-CM | POA: Diagnosis not present

## 2016-11-11 IMAGING — MR MR ABDOMEN WO/W CM
16 of 25 series · 23 of 48 positions shown · IV contrast (19 ml multihance)
Comparison: None.

CLINICAL DATA: Indeterminate lesion in the RIGHT kidney on CT.
Questionable abdominal hernia.

EXAM:
MRI ABDOMEN AND PELVIS WITHOUT AND WITH CONTRAST
TECHNIQUE: Multiplanar multisequence MR imaging of the abdomen and pelvis was
performed both before and after the administration of intravenous
contrast.
CONTRAST:  19mL MULTIHANCE GADOBENATE DIMEGLUMINE 529 MG/ML IV SOLN

[Series 3: t2_tse_sag · sagittal · 4.5mm · 0.44mm/px · 1 of 35 slices shown]
[im 1/35]
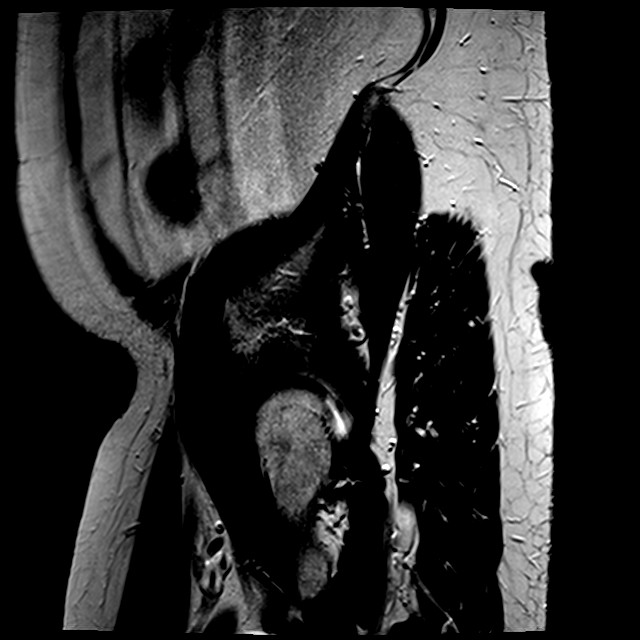

[Series 4: T2 · coronal · 5.5mm · 0.52mm/px · 1 of 26 slices shown (1 of 3)]
[im 1/26]
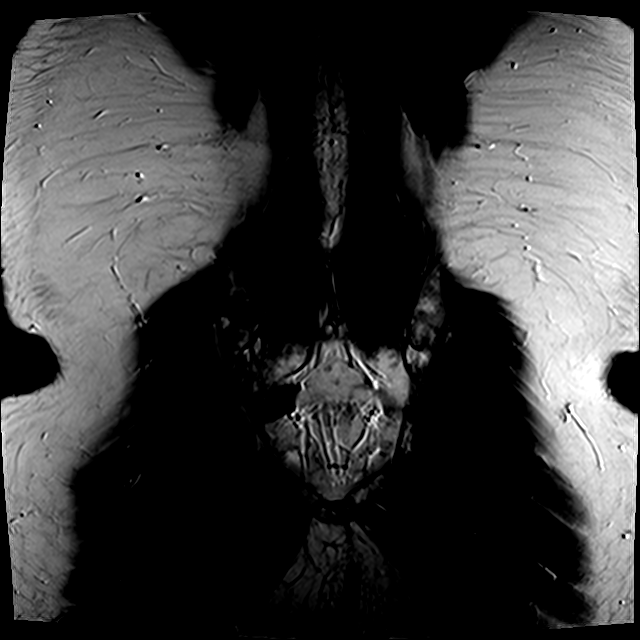

[Series 5: T1 fat-sat · axial · 6.5mm · 0.53mm/px · 1 of 28 slices shown]
[im 1/28]
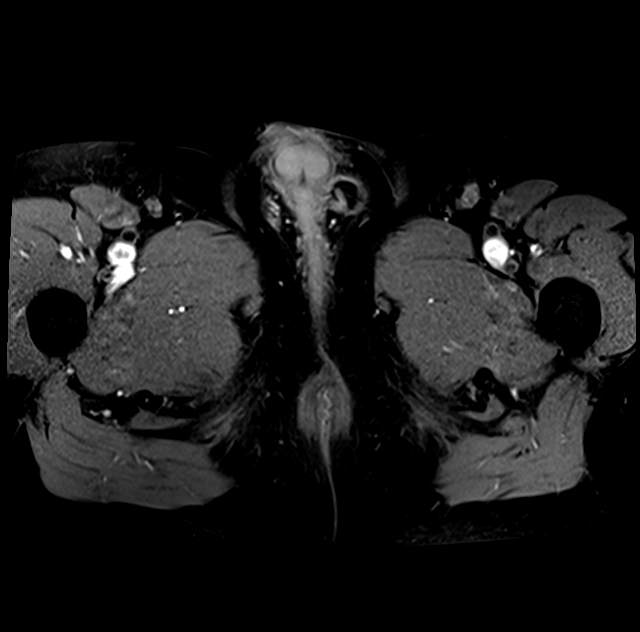

[Series 6: T1 · axial · 6.5mm · 0.53mm/px · 1 of 28 slices shown (1 of 2)]
[im 1/28]
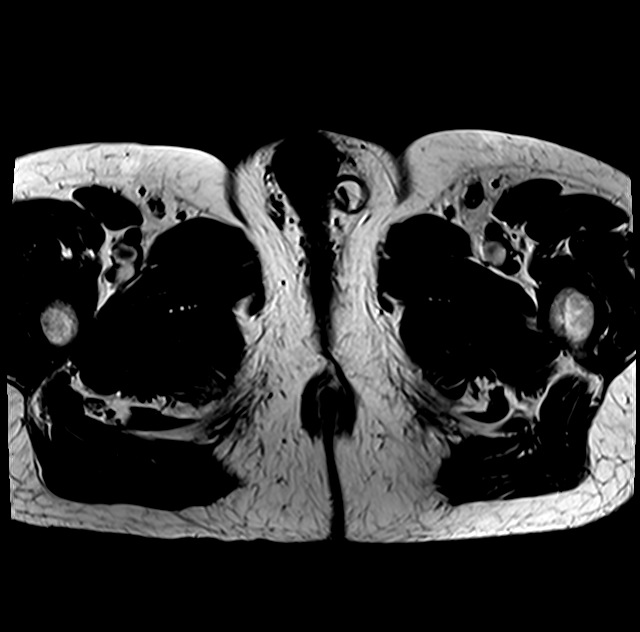

[Series 7: T2 · axial · 6.5mm · 0.53mm/px · 1 of 28 slices shown (2 of 3)]
[im 1/28]
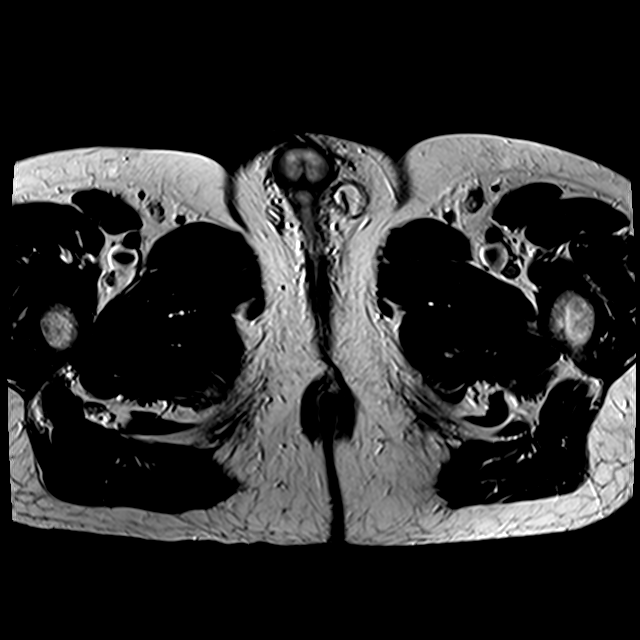

[Series 8: T1 dynamic · sagittal · non-contrast · 5.0mm · 0.62mm/px · 1 of 52 slices shown (1 of 2)]
[im 1/52]
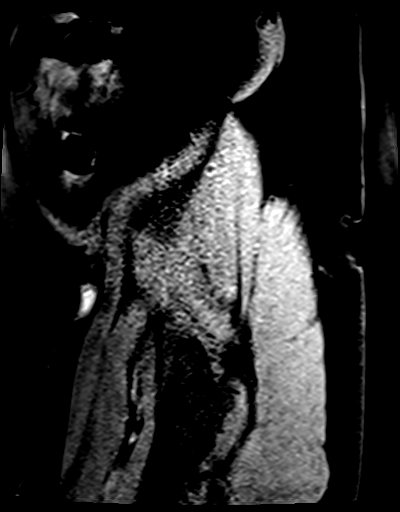

[Series 12: bSSFP · axial · 5.5mm · 0.76mm/px · 1 of 37 slices shown]
[im 1/37]
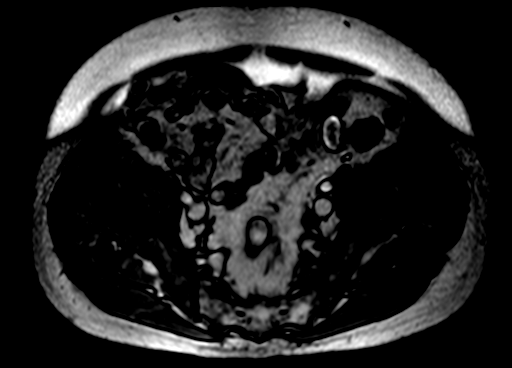

[Series 13: cor haste · coronal · 5.5mm · 0.74mm/px · 1 of 24 slices shown]
[im 1/24]
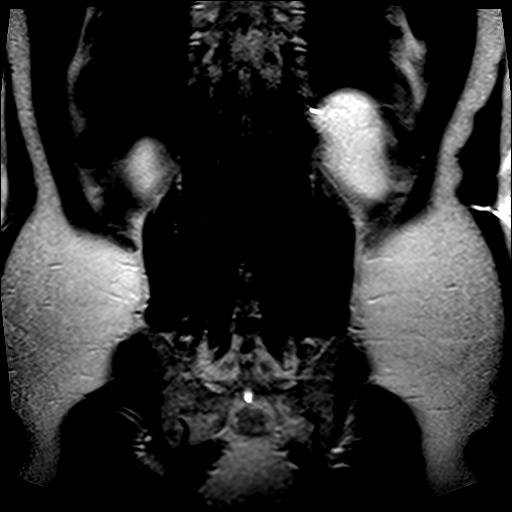

[Series 15: T2 · axial · 6.5mm · 1.02mm/px · 1 of 35 slices shown (3 of 3)]
[im 1/35]
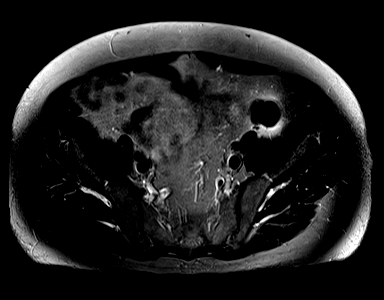

[Series 16: ep2d_[id number]_800_p2_trig · axial · 6.0mm · 1.98mm/px · z∈[+184,+393]mm · 2 of 90 slices shown]
[im 1/90]
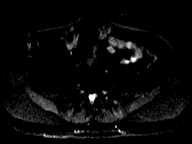
[im 90/90]
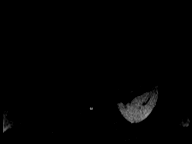

[Series 17: ep2d_[id number]_500_800_p2_trig_adc · axial · 6.0mm · 1.98mm/px · 1 of 30 slices shown]
[im 1/30]
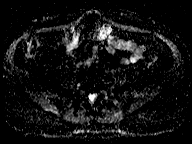

[Series 18: T1 · axial · 6.0mm · 0.76mm/px · z∈[+111,+415]mm · 2 of 72 slices shown (2 of 2)]
[im 1/72]
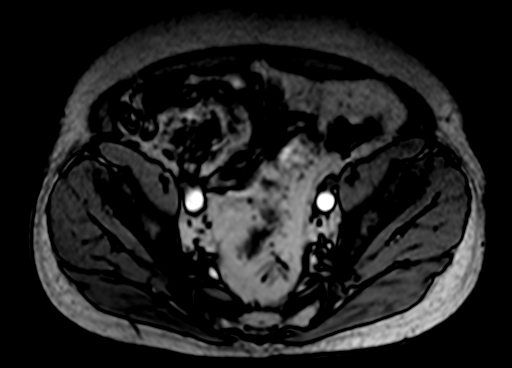
[im 72/72]
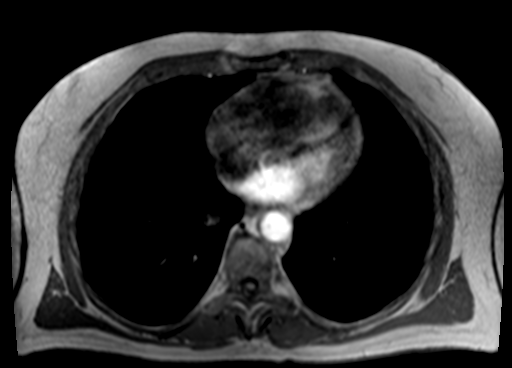

[Series 19: axial haste · axial · 5.5mm · 0.76mm/px · 1 of 42 slices shown]
[im 1/42]
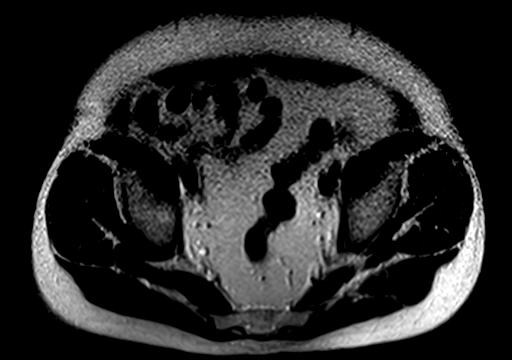

[Series 20: T1 dynamic · axial · non-contrast · 4.0mm · 0.78mm/px · z∈[+105,+421]mm · 3 of 80 slices shown (2 of 2)]
[im 1/80]
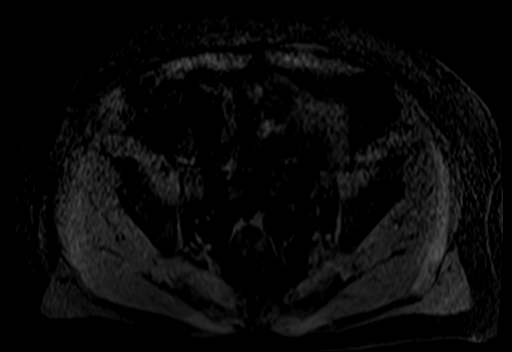
[im 40/80]
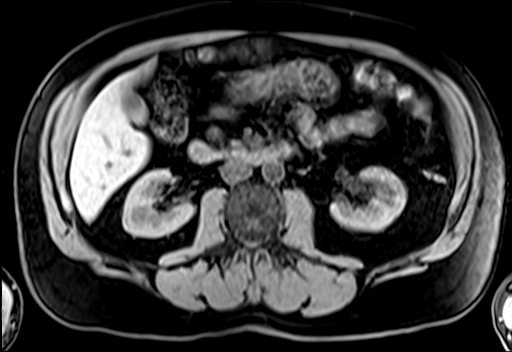
[im 80/80]
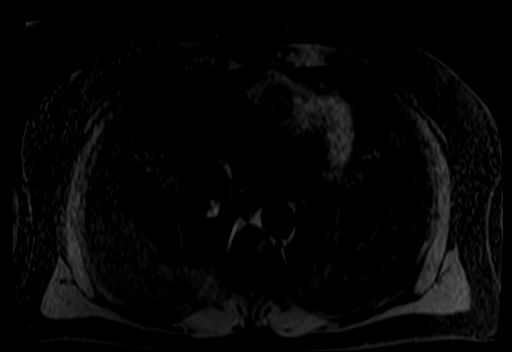

[Series 21: T1 dynamic post-contrast · axial · 4.0mm · 0.78mm/px · z∈[+105,+421]mm · 3 of 80 slices shown (1 of 2)]
[im 1/80]
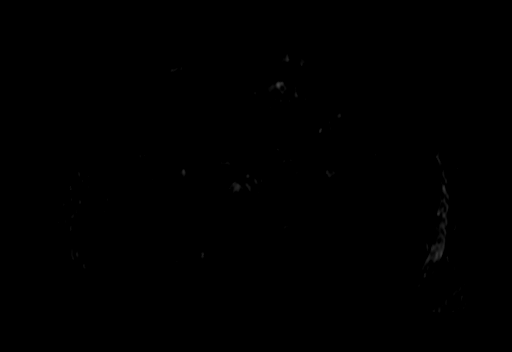
[im 40/80]
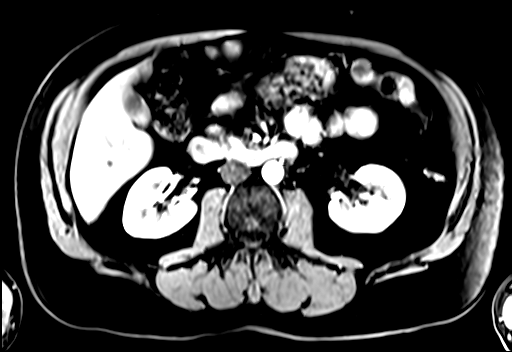
[im 80/80]
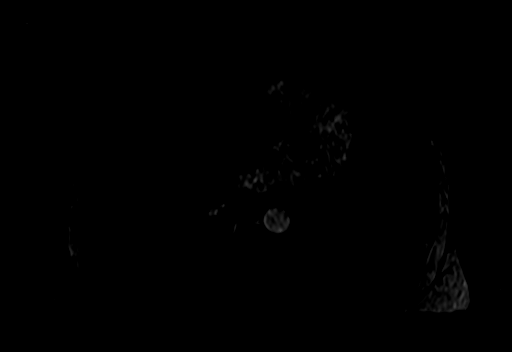

[Series 22: T1 dynamic post-contrast · axial · 4.0mm · 0.78mm/px · z∈[+105,+261]mm · 2 of 80 slices shown (2 of 2)]
[im 1/80]
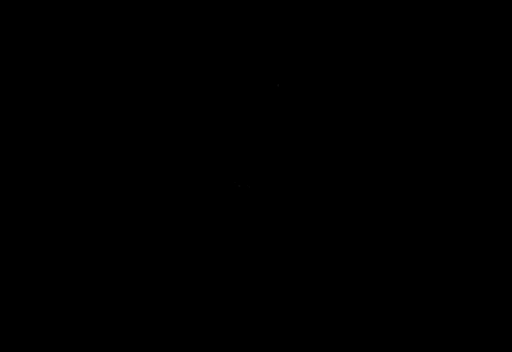
[im 40/80]
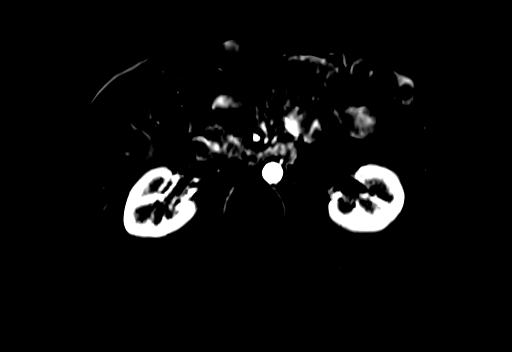

[23 of 48 positions shown; findings below may reference images not displayed]

FINDINGS: Lower chest: 13 mm round lesion in the RIGHT lower lobe (image 2,
series 19) is hyperintense on T2 weighted imaging.

Hepatobiliary: No focal hepatic lesion. Liver parenchyma has normal
signal intensity.

Pancreas: Fatty replaced pancreatic head. Pancreas is normal signal
intensity in the body and tail. No duct dilatation

Spleen: Normal spleen

Adrenals/urinary tract: Adrenal glands are normal.

Lesion of concern in the RIGHT kidney is heterogeneously high signal
intensity on T2 weighted imaging measuring 15 mm on image 25, series
19. Lesion is essentially isointense on precontrast T1 weighted
imaging. On postcontrast T1 weighting imaging, lesion demonstrates
enhancement to a slightly lesser degree than the adjacent normal
renal parenchyma (image 48, series [DATE]). The subtracted series
demonstrate enhancement (series 22 and 24). Lesion demonstrates some
central pooling of contrast on the more delayed series.

There is additional nonenhancing Bosniak 1 renal cyst of the
anterior cortex of the RIGHT kidney measures 11 mm. No LEFT renal
lesion

Stomach/Bowel: Stomach, small bowel, appendix, and cecum are normal.
The colon and rectosigmoid colon are normal.

Vascular/Lymphatic: Abdominal aorta is normal caliber. There is no
retroperitoneal or periportal lymphadenopathy. No pelvic
lymphadenopathy.

Reproductive: Prostate normal.

Other: No significant ventral hernia. There is small fat filled LEFT
inguinal hernia. Small fat filled LEFT inguinal hernia seen on
sagittal image 24, series 3

Musculoskeletal: No aggressive osseous lesion.
IMPRESSION: 1. Enhancing lesion in the posterior cortex of the lower pole RIGHT
kidney is concerning for renal cell carcinoma until proven
otherwise.
2. Bosniak 1 renal cyst within the RIGHT kidney anteriorly.
3. Small fat filled LEFT inguinal hernia.
4. Rounded 13 mm lesion in the RIGHT lower lobe cannot be fully
characterize. Recommend CT of thorax contrast for complete
evaluation.
These results will be called to the ordering clinician or
representative by the Radiologist Assistant, and communication
documented in the PACS or zVision Dashboard.

## 2016-12-11 DIAGNOSIS — M546 Pain in thoracic spine: Secondary | ICD-10-CM | POA: Diagnosis not present

## 2016-12-30 ENCOUNTER — Ambulatory Visit (INDEPENDENT_AMBULATORY_CARE_PROVIDER_SITE_OTHER)
Admission: RE | Admit: 2016-12-30 | Discharge: 2016-12-30 | Disposition: A | Discharge status: Home / Self Care | Payer: Medicare Other | Source: Ambulatory Visit | Attending: Pulmonary Disease | Admitting: Pulmonary Disease

## 2016-12-30 DIAGNOSIS — R911 Solitary pulmonary nodule: Secondary | ICD-10-CM

## 2016-12-30 DIAGNOSIS — R918 Other nonspecific abnormal finding of lung field: Secondary | ICD-10-CM | POA: Diagnosis not present

## 2017-01-01 ENCOUNTER — Ambulatory Visit: Payer: Medicare Other | Admitting: Pulmonary Disease

## 2017-01-06 ENCOUNTER — Other Ambulatory Visit: Payer: Self-pay

## 2017-01-06 DIAGNOSIS — R918 Other nonspecific abnormal finding of lung field: Secondary | ICD-10-CM

## 2017-01-26 ENCOUNTER — Other Ambulatory Visit: Payer: Self-pay | Admitting: Emergency Medicine

## 2017-01-26 MED ORDER — METHOTREXATE 2.5 MG PO TABS: ORAL_TABLET | ORAL | 1 refills | Status: DC

## 2017-03-16 ENCOUNTER — Telehealth: Payer: Self-pay | Admitting: Family Medicine

## 2017-03-26 ENCOUNTER — Other Ambulatory Visit (INDEPENDENT_AMBULATORY_CARE_PROVIDER_SITE_OTHER): Payer: Medicare Other

## 2017-03-26 DIAGNOSIS — C61 Malignant neoplasm of prostate: Secondary | ICD-10-CM | POA: Diagnosis not present

## 2017-03-26 DIAGNOSIS — I10 Essential (primary) hypertension: Secondary | ICD-10-CM | POA: Diagnosis not present

## 2017-03-26 DIAGNOSIS — E785 Hyperlipidemia, unspecified: Secondary | ICD-10-CM | POA: Diagnosis not present

## 2017-03-26 LAB — POC URINALSYSI DIPSTICK (AUTOMATED)
Bilirubin, UA: NEGATIVE
Glucose, UA: NEGATIVE
KETONES UA: NEGATIVE
Leukocytes, UA: NEGATIVE
Nitrite, UA: NEGATIVE
PROTEIN UA: NEGATIVE
RBC UA: NEGATIVE
SPEC GRAV UA: 1.015 (ref 1.010–1.025)
Urobilinogen, UA: 0.2 E.U./dL
pH, UA: 6 (ref 5.0–8.0)

## 2017-03-26 LAB — HEPATIC FUNCTION PANEL
ALBUMIN: 4.3 g/dL (ref 3.5–5.2)
ALK PHOS: 75 U/L (ref 39–117)
ALT: 21 U/L (ref 0–53)
AST: 19 U/L (ref 0–37)
Bilirubin, Direct: 0.2 mg/dL (ref 0.0–0.3)
Total Bilirubin: 0.8 mg/dL (ref 0.2–1.2)
Total Protein: 7.1 g/dL (ref 6.0–8.3)

## 2017-03-26 LAB — CBC WITH DIFFERENTIAL/PLATELET
BASOS ABS: 0.1 10*3/uL (ref 0.0–0.1)
Basophils Relative: 2 % (ref 0.0–3.0)
Eosinophils Absolute: 0.1 10*3/uL (ref 0.0–0.7)
Eosinophils Relative: 1.7 % (ref 0.0–5.0)
HCT: 44.1 % (ref 39.0–52.0)
Hemoglobin: 14.8 g/dL (ref 13.0–17.0)
LYMPHS ABS: 1.4 10*3/uL (ref 0.7–4.0)
Lymphocytes Relative: 32.4 % (ref 12.0–46.0)
MCHC: 33.5 g/dL (ref 30.0–36.0)
MCV: 99 fl (ref 78.0–100.0)
MONOS PCT: 10.9 % (ref 3.0–12.0)
Monocytes Absolute: 0.5 10*3/uL (ref 0.1–1.0)
NEUTROS PCT: 53 % (ref 43.0–77.0)
Neutro Abs: 2.3 10*3/uL (ref 1.4–7.7)
Platelets: 389 10*3/uL (ref 150.0–400.0)
RBC: 4.45 Mil/uL (ref 4.22–5.81)
RDW: 14.3 % (ref 11.5–15.5)
WBC: 4.3 10*3/uL (ref 4.0–10.5)

## 2017-03-26 LAB — LIPID PANEL
CHOL/HDL RATIO: 3
Cholesterol: 194 mg/dL (ref 0–200)
HDL: 59.6 mg/dL (ref >39.00)
LDL CALC: 124 mg/dL — AB (ref 0–99)
NONHDL: 134.85
Triglycerides: 52 mg/dL (ref 0.0–149.0)
VLDL: 10.4 mg/dL (ref 0.0–40.0)

## 2017-03-26 LAB — BASIC METABOLIC PANEL
BUN: 19 mg/dL (ref 6–23)
CALCIUM: 9.2 mg/dL (ref 8.4–10.5)
CO2: 26 mEq/L (ref 19–32)
Chloride: 104 mEq/L (ref 96–112)
Creatinine, Ser: 1.01 mg/dL (ref 0.40–1.50)
GFR: 78.43 mL/min (ref >60.00)
GLUCOSE: 95 mg/dL (ref 70–99)
Potassium: 4.4 mEq/L (ref 3.5–5.1)
SODIUM: 138 meq/L (ref 135–145)

## 2017-03-26 LAB — PSA: PSA: 0.89 ng/mL (ref 0.10–4.00)

## 2017-03-26 LAB — TSH: TSH: 4.42 u[IU]/mL (ref 0.35–4.50)

## 2017-03-27 ENCOUNTER — Other Ambulatory Visit: Payer: Self-pay | Admitting: Family Medicine

## 2017-03-27 NOTE — Telephone Encounter (Signed)
Sent to the pharmacy by e-scribe for 1 month. Pt has upcoming appt on 03/30/17

## 2017-03-30 ENCOUNTER — Encounter: Payer: Self-pay | Admitting: Family Medicine

## 2017-03-30 ENCOUNTER — Ambulatory Visit (INDEPENDENT_AMBULATORY_CARE_PROVIDER_SITE_OTHER): Payer: Medicare Other | Admitting: Family Medicine

## 2017-03-30 VITALS — BP 144/70 | Temp 98.1°F | Ht 71.0 in | Wt 205.0 lb

## 2017-03-30 DIAGNOSIS — M06042 Rheumatoid arthritis without rheumatoid factor, left hand: Secondary | ICD-10-CM | POA: Diagnosis not present

## 2017-03-30 DIAGNOSIS — Z23 Encounter for immunization: Secondary | ICD-10-CM

## 2017-03-30 DIAGNOSIS — M06041 Rheumatoid arthritis without rheumatoid factor, right hand: Secondary | ICD-10-CM | POA: Diagnosis not present

## 2017-03-30 DIAGNOSIS — N529 Male erectile dysfunction, unspecified: Secondary | ICD-10-CM

## 2017-03-30 DIAGNOSIS — I10 Essential (primary) hypertension: Secondary | ICD-10-CM

## 2017-03-30 DIAGNOSIS — Z136 Encounter for screening for cardiovascular disorders: Secondary | ICD-10-CM | POA: Diagnosis not present

## 2017-03-30 MED ORDER — ZOSTER VAC RECOMB ADJUVANTED 50 MCG/0.5ML IM SUSR: 0.5000 mL | Freq: Once | INTRAMUSCULAR | 1 refills | Status: AC

## 2017-03-30 MED ORDER — ACYCLOVIR 800 MG PO TABS
800.0000 mg | ORAL_TABLET | Freq: Three times a day (TID) | ORAL | 1 refills | Status: DC | PRN
Start: 2017-03-30 — End: 2018-05-11

## 2017-03-30 MED ORDER — SILDENAFIL CITRATE 100 MG PO TABS: 100.0000 mg | ORAL_TABLET | ORAL | 11 refills | Status: DC | PRN

## 2017-03-30 MED ORDER — VERAPAMIL HCL ER 180 MG PO TBCR: 180.0000 mg | EXTENDED_RELEASE_TABLET | Freq: Every day | ORAL | 4 refills | Status: DC

## 2017-03-30 MED ORDER — OMEPRAZOLE 20 MG PO CPDR: 20.0000 mg | DELAYED_RELEASE_CAPSULE | Freq: Every day | ORAL | 4 refills | Status: DC

## 2017-03-30 MED ORDER — METHOTREXATE 2.5 MG PO TABS: ORAL_TABLET | ORAL | 4 refills | Status: DC

## 2017-03-30 NOTE — Progress Notes (Signed)
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 He's asymptomatic.  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  He takes Prilosec 20 mg a day for chronic reflux  He uses Viagra when necessary for mild ED. He's also on verapamil 180 mg daily because a history of mild hypertension. Recently he was given gabapentin by Dr. Herma Mering for back pain.  He gets routine eye care, dental care, colonoscopy was 10 years ago. He's due for follow-up this fall.  Vaccinations up-to-date except he is doing Pneumovax information given on shingles.  He sees pulmonary every 6 months. He has a pulmonary nodule. CT scans of all been normal.  He does not see the urologist anymore. They removed a benign lesion from his [name] a couple years ago. He's asymptomatic.  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 He walks 6 miles a day  14 point review of systems reviewed otherwise negative  Cognitive function normal he walks daily home health safety reviewed no issues identified, no guns in the house, he does have a healthcare power of attorney and living well  BP (!) 144/70 (BP Location: Right Arm, Patient Position: Sitting, Cuff Size: Large)   Temp 98.1 F (36.7 C) (Oral)   Ht '5\' 11"'$  (1.803 m)   Wt 205 lb (93 kg)   BMI 28.59 kg/m  Examination HEENT were negative except the right pupil was 2 dropped........ baseball injury when he was a child. Neck was supple thyroid is not enlarged no carotid bruits cardiopulmonary exam normal abdominal exam normal genitalia normal circumcised male rectum normal stool guaiac-negative prostate smooth nonnodular 1+ BPH......... nocturia 1..  Extremities normal skin normal peripheral pulses normal  EKG was done because of a  history of hypertension and was negative and unchanged normal  #1 rheumatoid arthritis....... continue MTX  #2 reflux esophagitis....... continue Prilosec  #3 history of hypertension........... continue verapamil  #4 erectile dysfunction........Marland Kitchen Viagra when necessary  #5 status post right renal mass removed...........Marland Kitchen benign  #6 low back pain....... followed by Dr. Herma Mering. Marland Kitchen

## 2017-03-30 NOTE — Patient Instructions (Addendum)
Continue good diet and daily exercise  Continue current medications  Follow-up in one year sooner if any problems  If GI does not call you about a follow-up colonoscopy call them directly at [email or web address],,,,,,,,,,,, for the Viagra  Call your insurance company and find out where you get the shingles vaccine the cheapest

## 2017-03-30 NOTE — Progress Notes (Signed)
Pre visit review using our clinic review tool, if applicable. No additional management support is needed unless otherwise documented below in the visit note. 

## 2017-04-28 NOTE — Telephone Encounter (Signed)
Called to see if pt wanted to schedule awv( will be here on 4/12 for labs - do it while he is here?)left message to call us back

## 2017-08-18 ENCOUNTER — Other Ambulatory Visit: Payer: Self-pay | Admitting: Pediatrics

## 2017-08-18 MED ORDER — METHOTREXATE 2.5 MG PO TABS: ORAL_TABLET | ORAL | 4 refills | Status: DC

## 2017-08-18 NOTE — Telephone Encounter (Signed)
Last OV/labs 03/30/17

## 2017-08-19 ENCOUNTER — Ambulatory Visit (INDEPENDENT_AMBULATORY_CARE_PROVIDER_SITE_OTHER)
Admission: RE | Admit: 2017-08-19 | Discharge: 2017-08-19 | Disposition: A | Discharge status: Home / Self Care | Payer: Medicare Other | Source: Ambulatory Visit | Attending: Pulmonary Disease | Admitting: Pulmonary Disease

## 2017-08-19 DIAGNOSIS — R918 Other nonspecific abnormal finding of lung field: Secondary | ICD-10-CM

## 2017-08-21 ENCOUNTER — Telehealth: Payer: Self-pay | Admitting: Pulmonary Disease

## 2017-08-21 DIAGNOSIS — R918 Other nonspecific abnormal finding of lung field: Secondary | ICD-10-CM

## 2017-08-21 NOTE — Telephone Encounter (Signed)
Notes recorded by Marshell Garfinkel, MD on 08/19/2017 at 4:59 PM EDT Please let the patient know that the CT shows that the lung nodule is stable compared to last scan. Order follow up scan without contrast in 1 year. ---- Spoke with pt, aware of results/recs.  Follow up CT chest ordered.  Nothing further needed.

## 2017-08-31 ENCOUNTER — Other Ambulatory Visit: Payer: Self-pay

## 2017-08-31 ENCOUNTER — Telehealth: Payer: Self-pay

## 2017-08-31 MED ORDER — METHOTREXATE 2.5 MG PO TABS: ORAL_TABLET | ORAL | 4 refills | Status: DC

## 2017-08-31 NOTE — Telephone Encounter (Signed)
Pharmacy requests for 90 days supply

## 2017-09-04 ENCOUNTER — Encounter: Payer: Self-pay | Admitting: Family Medicine

## 2017-10-19 ENCOUNTER — Encounter: Payer: Self-pay | Admitting: Gastroenterology

## 2017-10-25 DIAGNOSIS — Z23 Encounter for immunization: Secondary | ICD-10-CM | POA: Diagnosis not present

## 2017-12-21 DIAGNOSIS — Z961 Presence of intraocular lens: Secondary | ICD-10-CM | POA: Diagnosis not present

## 2017-12-21 DIAGNOSIS — H25012 Cortical age-related cataract, left eye: Secondary | ICD-10-CM | POA: Diagnosis not present

## 2017-12-21 DIAGNOSIS — H35363 Drusen (degenerative) of macula, bilateral: Secondary | ICD-10-CM | POA: Diagnosis not present

## 2017-12-21 DIAGNOSIS — H2512 Age-related nuclear cataract, left eye: Secondary | ICD-10-CM | POA: Diagnosis not present

## 2018-02-15 ENCOUNTER — Encounter: Payer: Self-pay | Admitting: Gastroenterology

## 2018-03-05 ENCOUNTER — Encounter: Payer: Self-pay | Admitting: *Deleted

## 2018-03-22 ENCOUNTER — Other Ambulatory Visit: Payer: Self-pay | Admitting: *Deleted

## 2018-03-22 MED ORDER — METHOTREXATE 2.5 MG PO TABS: ORAL_TABLET | ORAL | 4 refills | Status: DC

## 2018-03-29 ENCOUNTER — Ambulatory Visit (AMBULATORY_SURGERY_CENTER): Payer: Self-pay | Admitting: *Deleted

## 2018-03-29 ENCOUNTER — Other Ambulatory Visit: Payer: Self-pay

## 2018-03-29 VITALS — Ht 72.0 in | Wt 208.0 lb

## 2018-03-29 DIAGNOSIS — Z1211 Encounter for screening for malignant neoplasm of colon: Secondary | ICD-10-CM

## 2018-03-29 MED ORDER — PEG 3350-KCL-NA BICARB-NACL 420 G PO SOLR: ORAL | 0 refills | Status: DC

## 2018-03-29 NOTE — Progress Notes (Signed)
Patient denies any allergies to eggs or soy. Patient denies any problems with anesthesia/sedation. Patient denies any oxygen use at home. Patient denies taking any diet/weight loss medications or blood thinners. EMMI education declined by the pt during PV today.

## 2018-04-16 ENCOUNTER — Ambulatory Visit (AMBULATORY_SURGERY_CENTER): Payer: Medicare Other | Admitting: Gastroenterology

## 2018-04-16 ENCOUNTER — Encounter: Payer: Self-pay | Admitting: Gastroenterology

## 2018-04-16 ENCOUNTER — Other Ambulatory Visit: Payer: Self-pay

## 2018-04-16 VITALS — BP 148/74 | HR 62 | Temp 98.9°F | Resp 15 | Ht 72.0 in | Wt 208.0 lb

## 2018-04-16 DIAGNOSIS — K219 Gastro-esophageal reflux disease without esophagitis: Secondary | ICD-10-CM | POA: Diagnosis not present

## 2018-04-16 DIAGNOSIS — I4891 Unspecified atrial fibrillation: Secondary | ICD-10-CM | POA: Diagnosis not present

## 2018-04-16 DIAGNOSIS — Z1212 Encounter for screening for malignant neoplasm of rectum: Secondary | ICD-10-CM | POA: Diagnosis not present

## 2018-04-16 DIAGNOSIS — E669 Obesity, unspecified: Secondary | ICD-10-CM | POA: Diagnosis not present

## 2018-04-16 DIAGNOSIS — R55 Syncope and collapse: Secondary | ICD-10-CM | POA: Diagnosis not present

## 2018-04-16 DIAGNOSIS — Z1211 Encounter for screening for malignant neoplasm of colon: Secondary | ICD-10-CM | POA: Diagnosis not present

## 2018-04-16 DIAGNOSIS — M069 Rheumatoid arthritis, unspecified: Secondary | ICD-10-CM | POA: Diagnosis not present

## 2018-04-16 DIAGNOSIS — I10 Essential (primary) hypertension: Secondary | ICD-10-CM | POA: Diagnosis not present

## 2018-04-16 MED ORDER — SODIUM CHLORIDE 0.9 % IV SOLN: 500.0000 mL | Freq: Once | INTRAVENOUS | Status: DC

## 2018-04-16 NOTE — Op Note (Signed)
[facility] Patient Name: Max [name] Procedure Date: 04/16/2018 8:34 AM MRN: [id number] Endoscopist: Milus Banister , MD Age: 67 Referring MD:  Date of Birth: 08/04/1951 Gender: Male Account #: [id number] Procedure:                Colonoscopy Indications:              Screening for colorectal malignant neoplasm Medicines:                Monitored Anesthesia Care Procedure:                Pre-Anesthesia Assessment:                           - Prior to the procedure, a History and Physical                            was performed, and patient medications and                            allergies were reviewed. The patient's tolerance of                            previous anesthesia was also reviewed. The risks                            and benefits of the procedure and the sedation                            options and risks were discussed with the patient.                            All questions were answered, and informed consent                            was obtained. Prior Anticoagulants: The patient has                            taken no previous anticoagulant or antiplatelet                            agents. ASA Grade Assessment: II - A patient with                            mild systemic disease. After reviewing the risks                            and benefits, the patient was deemed in                            satisfactory condition to undergo the procedure.                           After obtaining informed consent, the colonoscope  was passed under direct vision. Throughout the                            procedure, the patient's blood pressure, pulse, and                            oxygen saturations were monitored continuously. The                            Colonoscope was introduced through the anus and                            advanced to the the cecum, identified by                            appendiceal orifice and  ileocecal valve. The                            colonoscopy was performed without difficulty. The                            patient tolerated the procedure well. The quality                            of the bowel preparation was good. The ileocecal                            valve, appendiceal orifice, and rectum were                            photographed. Scope In: 8:40:41 AM Scope Out: 8:53:03 AM Scope Withdrawal Time: 0 hours 8 minutes 38 seconds  Total Procedure Duration: 0 hours 12 minutes 22 seconds  Findings:                 The entire examined colon appeared normal on direct                            and retroflexion views. Complications:            No immediate complications. Estimated blood loss:                            None. Estimated Blood Loss:     Estimated blood loss: none. Impression:               - The entire examined colon is normal on direct and                            retroflexion views.                           - No polyps or cancers. Recommendation:           - Patient has a contact number available for  emergencies. The signs and symptoms of potential                            delayed complications were discussed with the                            patient. Return to normal activities tomorrow.                            Written discharge instructions were provided to the                            patient.                           - Resume previous diet.                           - Continue present medications.                           - Repeat colonoscopy in 10 years for screening                            purposes. Milus Banister, MD 04/16/2018 8:54:41 AM This report has been signed electronically.

## 2018-04-16 NOTE — Progress Notes (Signed)
Report to PACU, RN, vss, BBS= Clear.  

## 2018-04-16 NOTE — Patient Instructions (Signed)
YOU HAD AN ENDOSCOPIC PROCEDURE TODAY AT THE [facility]:   Refer to the procedure report that was given to you for any specific questions about what was found during the examination.  If the procedure report does not answer your questions, please call your gastroenterologist to clarify.  If you requested that your care partner not be given the details of your procedure findings, then the procedure report has been included in a sealed envelope for you to review at your convenience later.  YOU SHOULD EXPECT: Some feelings of bloating in the abdomen. Passage of more gas than usual.  Walking can help get rid of the air that was put into your GI tract during the procedure and reduce the bloating. If you had a lower endoscopy (such as a colonoscopy or flexible sigmoidoscopy) you may notice spotting of blood in your stool or on the toilet paper. If you underwent a bowel prep for your procedure, you may not have a normal bowel movement for a few days.  Please Note:  You might notice some irritation and congestion in your nose or some drainage.  This is from the oxygen used during your procedure.  There is no need for concern and it should clear up in a day or so.  SYMPTOMS TO REPORT IMMEDIATELY:   Following lower endoscopy (colonoscopy or flexible sigmoidoscopy):  Excessive amounts of blood in the stool  Significant tenderness or worsening of abdominal pains  Swelling of the abdomen that is new, acute  Fever of 100F or higher  For urgent or emergent issues, a gastroenterologist can be reached at any hour by calling [phone].   DIET:  We do recommend a small meal at first, but then you may proceed to your regular diet.  Drink plenty of fluids but you should avoid alcoholic beverages for 24 hours.  ACTIVITY:  You should plan to take it easy for the rest of today and you should NOT DRIVE or use heavy machinery until tomorrow (because of the sedation medicines used during the test).     FOLLOW UP: Our staff will call the number listed on your records the next business day following your procedure to check on you and address any questions or concerns that you may have regarding the information given to you following your procedure. If we do not reach you, we will leave a message.  However, if you are feeling well and you are not experiencing any problems, there is no need to return our call.  We will assume that you have returned to your regular daily activities without incident.  If any biopsies were taken you will be contacted by phone or by letter within the next 1-3 weeks.  Please call us at [phone] if you have not heard about the biopsies in 3 weeks.    SIGNATURES/CONFIDENTIALITY: You and/or your care partner have signed paperwork which will be entered into your electronic medical record.  These signatures attest to the fact that that the information above on your After Visit Summary has been reviewed and is understood.  Full responsibility of the confidentiality of this discharge information lies with you and/or your care-partner. 

## 2018-04-19 ENCOUNTER — Telehealth: Payer: Self-pay

## 2018-04-19 NOTE — Telephone Encounter (Signed)
  Follow up Call-  Call Max [name] number 04/16/2018  Post procedure Call [name] phone  # [phone]  Permission to leave phone message Yes  Some recent data might be hidden     Patient questions:  Do you have a fever, pain , or abdominal swelling? No. Pain Score  0 *  Have you tolerated food without any problems? Yes.    Have you been able to return to your normal activities? Yes.    Do you have any questions about your discharge instructions: Diet   No. Medications  No. Follow up visit  No.  Do you have questions or concerns about your Care? No.  Actions: * If pain score is 4 or above: No action needed, pain <4.

## 2018-04-28 ENCOUNTER — Other Ambulatory Visit: Payer: Self-pay | Admitting: Family Medicine

## 2018-05-11 ENCOUNTER — Encounter: Payer: Self-pay | Admitting: Family Medicine

## 2018-05-11 ENCOUNTER — Ambulatory Visit (INDEPENDENT_AMBULATORY_CARE_PROVIDER_SITE_OTHER): Payer: Medicare Other | Admitting: Family Medicine

## 2018-05-11 VITALS — BP 130/70 | HR 74 | Temp 98.4°F | Ht 72.0 in | Wt 205.0 lb

## 2018-05-11 DIAGNOSIS — M06041 Rheumatoid arthritis without rheumatoid factor, right hand: Secondary | ICD-10-CM | POA: Diagnosis not present

## 2018-05-11 DIAGNOSIS — I10 Essential (primary) hypertension: Secondary | ICD-10-CM | POA: Diagnosis not present

## 2018-05-11 DIAGNOSIS — K469 Unspecified abdominal hernia without obstruction or gangrene: Secondary | ICD-10-CM | POA: Diagnosis not present

## 2018-05-11 DIAGNOSIS — I480 Paroxysmal atrial fibrillation: Secondary | ICD-10-CM | POA: Diagnosis not present

## 2018-05-11 DIAGNOSIS — M06042 Rheumatoid arthritis without rheumatoid factor, left hand: Secondary | ICD-10-CM | POA: Diagnosis not present

## 2018-05-11 DIAGNOSIS — N529 Male erectile dysfunction, unspecified: Secondary | ICD-10-CM

## 2018-05-11 DIAGNOSIS — N401 Enlarged prostate with lower urinary tract symptoms: Secondary | ICD-10-CM

## 2018-05-11 DIAGNOSIS — R351 Nocturia: Secondary | ICD-10-CM | POA: Diagnosis not present

## 2018-05-11 DIAGNOSIS — H8111 Benign paroxysmal vertigo, right ear: Secondary | ICD-10-CM | POA: Diagnosis not present

## 2018-05-11 HISTORY — DX: Benign paroxysmal vertigo, right ear: H81.11

## 2018-05-11 LAB — HEPATIC FUNCTION PANEL
ALK PHOS: 67 U/L (ref 39–117)
ALT: 17 U/L (ref 0–53)
AST: 16 U/L (ref 0–37)
Albumin: 4.2 g/dL (ref 3.5–5.2)
BILIRUBIN TOTAL: 0.6 mg/dL (ref 0.2–1.2)
Bilirubin, Direct: 0.1 mg/dL (ref 0.0–0.3)
Total Protein: 7.1 g/dL (ref 6.0–8.3)

## 2018-05-11 LAB — POCT URINALYSIS DIPSTICK
Bilirubin, UA: NEGATIVE
GLUCOSE UA: NEGATIVE
Ketones, UA: NEGATIVE
LEUKOCYTES UA: NEGATIVE
NITRITE UA: NEGATIVE
Protein, UA: NEGATIVE
RBC UA: NEGATIVE
Spec Grav, UA: >=1.03 — AB (ref 1.010–1.025)
Urobilinogen, UA: 0.2 E.U./dL
pH, UA: 6 (ref 5.0–8.0)

## 2018-05-11 LAB — BASIC METABOLIC PANEL
BUN: 20 mg/dL (ref 6–23)
CO2: 26 meq/L (ref 19–32)
CREATININE: 0.97 mg/dL (ref 0.40–1.50)
Calcium: 9.8 mg/dL (ref 8.4–10.5)
Chloride: 104 mEq/L (ref 96–112)
GFR: 81.89 mL/min (ref >60.00)
Glucose, Bld: 96 mg/dL (ref 70–99)
Potassium: 4.6 mEq/L (ref 3.5–5.1)
Sodium: 140 mEq/L (ref 135–145)

## 2018-05-11 LAB — CBC WITH DIFFERENTIAL/PLATELET
Basophils Absolute: 0.1 10*3/uL (ref 0.0–0.1)
Basophils Relative: 1.3 % (ref 0.0–3.0)
EOS ABS: 0 10*3/uL (ref 0.0–0.7)
Eosinophils Relative: 0.6 % (ref 0.0–5.0)
HCT: 42.7 % (ref 39.0–52.0)
Hemoglobin: 14.6 g/dL (ref 13.0–17.0)
LYMPHS ABS: 1 10*3/uL (ref 0.7–4.0)
Lymphocytes Relative: 20.4 % (ref 12.0–46.0)
MCHC: 34.3 g/dL (ref 30.0–36.0)
MCV: 99.6 fl (ref 78.0–100.0)
MONO ABS: 0.3 10*3/uL (ref 0.1–1.0)
Monocytes Relative: 7.2 % (ref 3.0–12.0)
NEUTROS PCT: 70.5 % (ref 43.0–77.0)
Neutro Abs: 3.4 10*3/uL (ref 1.4–7.7)
Platelets: 376 10*3/uL (ref 150.0–400.0)
RBC: 4.28 Mil/uL (ref 4.22–5.81)
RDW: 14.2 % (ref 11.5–15.5)
WBC: 4.8 10*3/uL (ref 4.0–10.5)

## 2018-05-11 LAB — TSH: TSH: 3.25 u[IU]/mL (ref 0.35–4.50)

## 2018-05-11 LAB — LIPID PANEL
CHOL/HDL RATIO: 3
Cholesterol: 175 mg/dL (ref 0–200)
HDL: 52 mg/dL (ref >39.00)
LDL Cholesterol: 108 mg/dL — ABNORMAL HIGH (ref 0–99)
NONHDL: 123.25
Triglycerides: 77 mg/dL (ref 0.0–149.0)
VLDL: 15.4 mg/dL (ref 0.0–40.0)

## 2018-05-11 LAB — PSA: PSA: 1.21 ng/mL (ref 0.10–4.00)

## 2018-05-11 MED ORDER — SILDENAFIL CITRATE 20 MG PO TABS: ORAL_TABLET | ORAL | 11 refills | Status: DC

## 2018-05-11 MED ORDER — VERAPAMIL HCL ER 180 MG PO TBCR: 180.0000 mg | EXTENDED_RELEASE_TABLET | Freq: Every day | ORAL | 4 refills | Status: DC

## 2018-05-11 MED ORDER — METHOTREXATE 2.5 MG PO TABS: ORAL_TABLET | ORAL | 4 refills | Status: DC

## 2018-05-11 MED ORDER — ACYCLOVIR 800 MG PO TABS: 800.0000 mg | ORAL_TABLET | Freq: Three times a day (TID) | ORAL | 1 refills | Status: DC | PRN

## 2018-05-11 NOTE — Progress Notes (Signed)
Max [name] is a 67 year old married male nonsmoker who comes in today for evaluation of the following problems  His history of PAF. He's on Kalynn 180 mg daily. Blood pressure and heart rhythm are normal.  He has a history of rheumatoid arthritis. He takes methotrexate 10 mg weekly. Doing well relatively asymptomatic  He also takes folic acid and multivitamins  He takes Prilosec 20 mg daily because of a history of chronic reflux esophagitis  He takes acyclovir when necessary for outbreaks of HSV one  He also uses Viagra when necessary for ED.  He states he had a bout of vertigo for years ago which required a trip the emergency room. It then went away. 6 months ago the vertigo returned. It's when he looks up into the right he gets dizzy now occurs every time. He works on cars an Psychologist, forensic. It's become a real issue.  He had a nephrectomy in 2017. For benign kidney tumor.  He's cut some discomfort in the midline of his abdomen from his umbilicus to xiphoid. It comes and goes. Decreases with sitting. No GI symptoms.  He also is not on his right Achilles tendon.  He gets routine eye care, dental care, colonoscopy 2017 normal  Vaccinations up-to-date  EKG was done because a history of atrial fibrillation. EKG was normal and unchanged  Cognitive function normal he exercises daily home health safety reviewed no issues identified, no guns in the house, he does have a healthcare power of attorney and living well.  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 Sclerotic from a childhood injury. Neck was supple  thyroid not enlarged no carotid bruits. Cardiopulmonary exam normal. Abdominal exam normal except for slight ventral hernia.  Genitalia normal male rectum deferred since she just had a colonoscopy. Extremities normal skin normal peripheral pulses normal except for a knot on his right Achilles tendon. It's at the insertion of the gastrocnemius.  Impression  #1 rheumatoid arthritis.......... continue current therapy check labs  #2 history of PAF and mild hypertension........ continue Kalynn 180 mg daily  #3 reflux esophagitis,,,,, continue Prilosec  #4 mild ED,,,,, refill Viagra  #5 BPV,,,,,,,,, daily...Marland KitchenMarland KitchenMarland Kitchen because a history of meningioma we'll get an ENT consult  #6 mild ventral hernia  #7 cystic lesion right Achilles tendon............ observe.Marland Kitchen

## 2018-05-11 NOTE — Patient Instructions (Signed)
Labs today............ I will call you if there is anything abnormal  Continue current medications  Continue good diet and exercise program  Call Dr. Alphonsa Overall........ ENT....... for a consult about your vertigo  Return in one year for general physical exam sooner if any problems

## 2018-08-20 ENCOUNTER — Ambulatory Visit (INDEPENDENT_AMBULATORY_CARE_PROVIDER_SITE_OTHER)
Admission: RE | Admit: 2018-08-20 | Discharge: 2018-08-20 | Disposition: A | Discharge status: Home / Self Care | Payer: Medicare Other | Source: Ambulatory Visit | Attending: Pulmonary Disease | Admitting: Pulmonary Disease

## 2018-08-20 DIAGNOSIS — R918 Other nonspecific abnormal finding of lung field: Secondary | ICD-10-CM

## 2018-08-20 DIAGNOSIS — R911 Solitary pulmonary nodule: Secondary | ICD-10-CM | POA: Diagnosis not present

## 2018-08-31 ENCOUNTER — Telehealth: Payer: Self-pay | Admitting: Pulmonary Disease

## 2018-08-31 NOTE — Telephone Encounter (Signed)
Advised pt of results. Pt understood and nothing further is needed.    Notes recorded by Marshell Garfinkel, MD on 08/30/2018 at 11:01 AM EDT Please let patient know that CT shows the 2 lung nodules are stable since 2017 to now. They are likely benign We can get a CT without contrast in 2 years. If there is stable at that point then we can stop following with scans.

## 2018-10-10 DIAGNOSIS — Z23 Encounter for immunization: Secondary | ICD-10-CM | POA: Diagnosis not present

## 2018-10-25 ENCOUNTER — Ambulatory Visit (INDEPENDENT_AMBULATORY_CARE_PROVIDER_SITE_OTHER): Payer: Medicare Other | Admitting: Family Medicine

## 2018-10-25 ENCOUNTER — Other Ambulatory Visit: Payer: Self-pay

## 2018-10-25 ENCOUNTER — Encounter: Payer: Self-pay | Admitting: Family Medicine

## 2018-10-25 VITALS — BP 142/82 | HR 89 | Temp 98.4°F | Ht 72.0 in | Wt 190.4 lb

## 2018-10-25 DIAGNOSIS — N5089 Other specified disorders of the male genital organs: Secondary | ICD-10-CM

## 2018-10-25 NOTE — Progress Notes (Signed)
  Subjective:     Patient ID: Max [name], male   DOB: [birth year], 67 y.o.   MRN: [id number]  HPI Patient is seen with left scrotal swelling.  First noted Saturday incidentally.  No pain.  No urinary symptoms.  He states he and his wife were playing around wrestling Friday but he denies any injury.  He does not think that is related to the swelling.  No history of hernia.  He is not aware of any history of hydrocele or spermatocele.  Past Medical History:  Diagnosis Date  . Allergic rhinitis   . Anxiety   . Cardiac arrhythmia   . ED (erectile dysfunction)   . Herpes   . History of syncope    in the setting of anxiety per cardiology note  . Hypertension   . Kidney tumor (benign), right    sx 02/27/2015  . Meningioma ([facility])    had surgery   . Paroxysmal atrial fibrillation ([facility]) 07/15/2012   a) CHADSVASc score 1b) On full-dose ASA and Verapamil  . RA (rheumatoid arthritis) ([facility])   . Seizures ([facility]) 05/14/2011   "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   . Vasovagal syncope    Past Surgical History:  Procedure Laterality Date  . BRAIN MENINGIOMA EXCISION  2012  . CATARACT EXTRACTION W/ INTRAOCULAR LENS IMPLANT  ~ 2001   right  . COLONOSCOPY  2008  . EYE SURGERY  1964   right; "hit w/baseball; eye hemorrhaged"  . FLEXIBLE SIGMOIDOSCOPY    . REFRACTIVE SURGERY  ~ 2003  . ROBOTIC ASSITED PARTIAL NEPHRECTOMY Right 02/29/2016   Procedure: XI ROBOTIC ASSITED PARTIAL NEPHRECTOMY;  Surgeon: Alexis Frock, MD;  Location: [facility];  Service: Urology;  Laterality: Right;   NEEDS ULTRASOUND    reports that he has never smoked. He has never used smokeless tobacco. He reports that he does not drink alcohol or use drugs. family history includes Depression in his other; GI problems in his other; Hypertension in his other; Lung disease in his other; Prostate cancer in his brother and paternal grandfather. Allergies  Allergen Reactions  . Vicodin [Hydrocodone-Acetaminophen] Nausea And Vomiting      Review of Systems  Constitutional: Negative for appetite change, chills, fever and unexpected weight change.  Gastrointestinal: Negative for abdominal pain.  Genitourinary: Positive for scrotal swelling. Negative for discharge, dysuria, hematuria and testicular pain.       Objective:   Physical Exam  Constitutional: He appears well-developed and well-nourished.  Cardiovascular: Normal rate and regular rhythm.  Pulmonary/Chest: Effort normal and breath sounds normal.  Genitourinary:  Genitourinary Comments: Right testicle is normal.  He has swelling left scrotal region superior to the testicle.  Testicle itself is nontender with no mass palpated.  Nontender to palpation.       Assessment:     2-day history of left scrotal swelling noted incidentally.  Rule out hydrocele    Plan:     -Set up scrotum ultrasound to further evaluate  Eulas Post MD [facility]

## 2018-10-25 NOTE — Patient Instructions (Signed)
We will set up scrotum ultrasound to further evaluate.  ?hydrocele

## 2018-10-29 ENCOUNTER — Other Ambulatory Visit: Payer: Medicare Other

## 2019-01-25 DIAGNOSIS — H18891 Other specified disorders of cornea, right eye: Secondary | ICD-10-CM | POA: Diagnosis not present

## 2019-01-25 DIAGNOSIS — H2512 Age-related nuclear cataract, left eye: Secondary | ICD-10-CM | POA: Diagnosis not present

## 2019-01-25 DIAGNOSIS — Z961 Presence of intraocular lens: Secondary | ICD-10-CM | POA: Diagnosis not present

## 2019-01-25 DIAGNOSIS — H35363 Drusen (degenerative) of macula, bilateral: Secondary | ICD-10-CM | POA: Diagnosis not present

## 2019-01-25 DIAGNOSIS — H25012 Cortical age-related cataract, left eye: Secondary | ICD-10-CM | POA: Diagnosis not present

## 2019-01-28 ENCOUNTER — Other Ambulatory Visit: Payer: Self-pay

## 2019-01-28 ENCOUNTER — Ambulatory Visit (INDEPENDENT_AMBULATORY_CARE_PROVIDER_SITE_OTHER): Payer: Medicare Other | Admitting: Family Medicine

## 2019-01-28 ENCOUNTER — Encounter: Payer: Self-pay | Admitting: Family Medicine

## 2019-01-28 VITALS — BP 152/76 | HR 76 | Temp 98.4°F | Ht 72.0 in | Wt 190.2 lb

## 2019-01-28 DIAGNOSIS — I10 Essential (primary) hypertension: Secondary | ICD-10-CM | POA: Diagnosis not present

## 2019-01-28 DIAGNOSIS — Z8669 Personal history of other diseases of the nervous system and sense organs: Secondary | ICD-10-CM | POA: Insufficient documentation

## 2019-01-28 DIAGNOSIS — N529 Male erectile dysfunction, unspecified: Secondary | ICD-10-CM

## 2019-01-28 DIAGNOSIS — R911 Solitary pulmonary nodule: Secondary | ICD-10-CM | POA: Diagnosis not present

## 2019-01-28 DIAGNOSIS — Z8619 Personal history of other infectious and parasitic diseases: Secondary | ICD-10-CM

## 2019-01-28 DIAGNOSIS — I480 Paroxysmal atrial fibrillation: Secondary | ICD-10-CM

## 2019-01-28 DIAGNOSIS — M06042 Rheumatoid arthritis without rheumatoid factor, left hand: Secondary | ICD-10-CM | POA: Diagnosis not present

## 2019-01-28 DIAGNOSIS — M06041 Rheumatoid arthritis without rheumatoid factor, right hand: Secondary | ICD-10-CM | POA: Diagnosis not present

## 2019-01-28 MED ORDER — OMEPRAZOLE 20 MG PO CPDR: 20.0000 mg | DELAYED_RELEASE_CAPSULE | Freq: Every day | ORAL | 3 refills | Status: DC

## 2019-01-28 NOTE — Assessment & Plan Note (Signed)
Stable. Continue sildenafil as needed. 

## 2019-01-28 NOTE — Patient Instructions (Signed)
It was very nice to see you today!  Please start taking a baby aspirin every day.  I will refill your Prilosec today.  Please keep an eye on your blood pressure and let me know if persistently 140/90 or higher.  Come back to see me in a few months for your next visit blood work.  Come back to see me sooner if needed.  Take care, Dr [name]

## 2019-01-28 NOTE — Assessment & Plan Note (Signed)
Continue management per pulmonology.

## 2019-01-28 NOTE — Assessment & Plan Note (Signed)
Stable.  Continue methotrexate 10 mg daily.  Continue GI protection Prilosec 20 mg daily.  Continue folic acid 779 mcg daily.  If symptoms worsen, will need referral to rheumatology.

## 2019-01-28 NOTE — Assessment & Plan Note (Signed)
Regular rate today.  Chadsvasc score 2.  Discussed starting anticoagulant today to help reduce risk of stroke.  Discussed risks of anticoagulation including significant bleed.  At this point, patient wishes to not start anticoagulant.  We will start aspirin 81 mg daily.  Continue verapamil 180 mg daily.

## 2019-01-28 NOTE — Assessment & Plan Note (Signed)
Above goal today.  Home readings have been at goal.  Continue home blood pressure monitoring with goal 140/90 or lower.  Follow-up with me in a few months.

## 2019-01-28 NOTE — Progress Notes (Signed)
   Chief Complaint:  Max [name] is a 68 y.o. male who presents today with a chief complaint of essential hypertension and to transfer care to this office  Assessment/Plan:  Rheumatoid arthritis Stable.  Continue methotrexate 10 mg daily.  Continue GI protection Prilosec 20 mg daily.  Continue folic acid 222 mcg daily.  If symptoms worsen, will need referral to rheumatology.  Pulmonary nodule Continue management per pulmonology.  Paroxysmal atrial fibrillation Regular rate today.  Chadsvasc score 2.  Discussed starting anticoagulant today to help reduce risk of stroke.  Discussed risks of anticoagulation including significant bleed.  At this point, patient wishes to not start anticoagulant.  We will start aspirin 81 mg daily.  Continue verapamil 180 mg daily.  History of cold sores Stable.  Continue acyclovir as needed for outbreaks.  Essential hypertension Above goal today.  Home readings have been at goal.  Continue home blood pressure monitoring with goal 140/90 or lower.  Follow-up with me in a few months.   ERECTILE DYSFUNCTION, ORGANIC Stable.  Continue sildenafil as needed.  History of cataract Stable.  Continue management per ophthalmology.    Subjective:  HPI:  His stable, chronic medical conditions are outlined below:  # Essential hypertension  - On verapamil 180 mg daily and tolerating well - Home BPs: 120s/80s ROS: No reported chest pain or shortness of breath  # Paroxysmal atrial fibrillation - On verapamil 180 mg daily and tolerating well. - Has never been on anticoagulation.  # Rheumatoid arthritis -On methotrexate 10 mg weekly. -On Prilosec 20 mg daily for GI protection and folic acid 979 mcg daily. -No reported side effects  # Erectile dysfunction -Uses sildenafil as needed and tolerating well.  # History of cold sores -Uses acyclovir as needed for outbreaks.  % Pulmonary nodule -Follows with pulmonology.  % Cataracts -Follows with  ophthalmology-Dr Kathlen Mody  ROS: Per HPI  PMH: He reports that he has never smoked. He has never used smokeless tobacco. He reports that he does not drink alcohol or use drugs.      Objective:  Physical Exam: BP (!) 152/76 (BP Location: Left Arm, Patient Position: Sitting, Cuff Size: Normal)   Pulse 76   Temp 98.4 F (36.9 C) (Oral)   Ht 6' (1.829 m)   Wt 190 lb 4 oz (86.3 kg)   SpO2 96%   BMI 25.80 kg/m   Gen: NAD, resting comfortably CV: Regular rate and rhythm with no murmurs appreciated Pulm: Normal work of breathing, clear to auscultation bilaterally with no crackles, wheezes, or rhonchi MSK: No edema, cyanosis, or clubbing noted     Iyania Denne M. [name], MD 01/28/2019 9:23 AM

## 2019-01-28 NOTE — Assessment & Plan Note (Signed)
Stable.  Continue management per ophthalmology.

## 2019-01-28 NOTE — Assessment & Plan Note (Signed)
Stable.  Continue acyclovir as needed for outbreaks.

## 2019-01-31 DIAGNOSIS — H169 Unspecified keratitis: Secondary | ICD-10-CM | POA: Diagnosis not present

## 2019-01-31 DIAGNOSIS — H18891 Other specified disorders of cornea, right eye: Secondary | ICD-10-CM | POA: Diagnosis not present

## 2019-02-21 DIAGNOSIS — H169 Unspecified keratitis: Secondary | ICD-10-CM | POA: Diagnosis not present

## 2019-02-22 ENCOUNTER — Encounter: Payer: Self-pay | Admitting: Physical Therapy

## 2019-02-22 DIAGNOSIS — H18891 Other specified disorders of cornea, right eye: Secondary | ICD-10-CM | POA: Insufficient documentation

## 2019-06-20 ENCOUNTER — Ambulatory Visit (INDEPENDENT_AMBULATORY_CARE_PROVIDER_SITE_OTHER): Payer: Medicare Other | Admitting: Family Medicine

## 2019-06-20 ENCOUNTER — Other Ambulatory Visit: Payer: Self-pay

## 2019-06-20 ENCOUNTER — Encounter: Payer: Self-pay | Admitting: Family Medicine

## 2019-06-20 VITALS — BP 134/78 | HR 75 | Temp 98.1°F | Ht 72.0 in | Wt 195.0 lb

## 2019-06-20 DIAGNOSIS — Z13220 Encounter for screening for lipoid disorders: Secondary | ICD-10-CM | POA: Diagnosis not present

## 2019-06-20 DIAGNOSIS — I10 Essential (primary) hypertension: Secondary | ICD-10-CM | POA: Diagnosis not present

## 2019-06-20 DIAGNOSIS — I480 Paroxysmal atrial fibrillation: Secondary | ICD-10-CM | POA: Diagnosis not present

## 2019-06-20 DIAGNOSIS — Z125 Encounter for screening for malignant neoplasm of prostate: Secondary | ICD-10-CM

## 2019-06-20 DIAGNOSIS — Z6826 Body mass index (BMI) 26.0-26.9, adult: Secondary | ICD-10-CM

## 2019-06-20 DIAGNOSIS — M06041 Rheumatoid arthritis without rheumatoid factor, right hand: Secondary | ICD-10-CM

## 2019-06-20 DIAGNOSIS — Z1159 Encounter for screening for other viral diseases: Secondary | ICD-10-CM

## 2019-06-20 DIAGNOSIS — N5089 Other specified disorders of the male genital organs: Secondary | ICD-10-CM | POA: Insufficient documentation

## 2019-06-20 DIAGNOSIS — M06042 Rheumatoid arthritis without rheumatoid factor, left hand: Secondary | ICD-10-CM

## 2019-06-20 DIAGNOSIS — R739 Hyperglycemia, unspecified: Secondary | ICD-10-CM

## 2019-06-20 LAB — COMPREHENSIVE METABOLIC PANEL
ALT: 15 U/L (ref 0–53)
AST: 15 U/L (ref 0–37)
Albumin: 4.1 g/dL (ref 3.5–5.2)
Alkaline Phosphatase: 72 U/L (ref 39–117)
BUN: 18 mg/dL (ref 6–23)
CO2: 26 mEq/L (ref 19–32)
Calcium: 9 mg/dL (ref 8.4–10.5)
Chloride: 106 mEq/L (ref 96–112)
Creatinine, Ser: 0.96 mg/dL (ref 0.40–1.50)
GFR: 77.72 mL/min (ref >60.00)
Glucose, Bld: 101 mg/dL — ABNORMAL HIGH (ref 70–99)
Potassium: 4.4 mEq/L (ref 3.5–5.1)
Sodium: 141 mEq/L (ref 135–145)
Total Bilirubin: 0.6 mg/dL (ref 0.2–1.2)
Total Protein: 6.7 g/dL (ref 6.0–8.3)

## 2019-06-20 LAB — CBC
HCT: 45.2 % (ref 39.0–52.0)
Hemoglobin: 15.2 g/dL (ref 13.0–17.0)
MCHC: 33.8 g/dL (ref 30.0–36.0)
MCV: 101 fl — ABNORMAL HIGH (ref 78.0–100.0)
Platelets: 361 10*3/uL (ref 150.0–400.0)
RBC: 4.47 Mil/uL (ref 4.22–5.81)
RDW: 14.6 % (ref 11.5–15.5)
WBC: 5.2 10*3/uL (ref 4.0–10.5)

## 2019-06-20 LAB — TSH: TSH: 3.21 u[IU]/mL (ref 0.35–4.50)

## 2019-06-20 LAB — PSA, MEDICARE: PSA: 0.95 ng/ml (ref 0.10–4.00)

## 2019-06-20 LAB — LIPID PANEL
Cholesterol: 178 mg/dL (ref 0–200)
HDL: 53.7 mg/dL (ref >39.00)
LDL Cholesterol: 112 mg/dL — ABNORMAL HIGH (ref 0–99)
NonHDL: 124.32
Total CHOL/HDL Ratio: 3
Triglycerides: 61 mg/dL (ref 0.0–149.0)
VLDL: 12.2 mg/dL (ref 0.0–40.0)

## 2019-06-20 LAB — HEMOGLOBIN A1C: Hgb A1c MFr Bld: 5.9 % (ref 4.6–6.5)

## 2019-06-20 MED ORDER — VERAPAMIL HCL ER 180 MG PO TBCR: 180.0000 mg | EXTENDED_RELEASE_TABLET | Freq: Every day | ORAL | 3 refills | Status: DC

## 2019-06-20 MED ORDER — METHOTREXATE 2.5 MG PO TABS: ORAL_TABLET | ORAL | 3 refills | Status: DC

## 2019-06-20 NOTE — Patient Instructions (Addendum)
It was very nice to see you today!  Keep up the good work! No changes today.   Please let me know if your scrotal mass changes in any way.  We will check blood work today.  Come back to see me in 1 year, or sooner if needed.  Take care, Dr [name]

## 2019-06-20 NOTE — Progress Notes (Signed)
   Chief Complaint:  Max [name] is a 68 y.o. male who presents today with a chief complaint of blood pressure follow-up.   Assessment/Plan:  Rheumatoid arthritis Stable.  Continue methotrexate 10 mg daily.  Continue GI protection with Prilosec 20 mg daily.  Also continue folic acid 456 mcg daily.  Essential hypertension At goal.  Continue verapamil 100 mg daily.  Paroxysmal atrial fibrillation Rate controlled.  Regular rhythm today.  Continue verapamil 180 mg daily.  Scrotal mass Symptoms have been unchanged for the past year.  Given that he is having no pain and that symptoms are unchanged, I think it is reasonable to continue with watchful waiting.  Advised him to let us know if symptoms change in any way.  BMI 26 Discussed lifestyle modifications.  Hyperglycemia Check A1c  Preventive health care Check lipid panel and PSA.    Subjective:  HPI:  His stable, chronic medical conditions are outlined below:  # Essential hypertension  - On verapamil 180 mg daily and tolerating well ROS: No reported chest pain or shortness of breath  # Paroxysmal atrial fibrillation - On verapamil 180 mg daily and tolerating well. - Has never been on anticoagulation.  # Rheumatoid arthritis -On methotrexate 10 mg weekly and tolerating well -On Prilosec 20 mg daily for GI protection and folic acid 256 mcg daily. -No reported side effects  # Erectile dysfunction -Uses sildenafil as needed and tolerating well.  Patient also is concerned about left-sided scrotal mass.  Is been there for about a year.  Has seen a previous provider for this and was diagnosed with hydrocele.  He was told to continue with watchful waiting.  Symptoms have not changed since then.  ROS: Per HPI  PMH: He reports that he has never smoked. He has never used smokeless tobacco. He reports that he does not drink alcohol or use drugs.      Objective:  Physical Exam: BP 134/78 (BP Location: Left Arm,  Patient Position: Sitting, Cuff Size: Normal)   Pulse 75   Temp 98.1 F (36.7 C) (Oral)   Ht 6' (1.829 m)   Wt 195 lb (88.5 kg)   SpO2 96%   BMI 26.45 kg/m   Gen: NAD, resting comfortably CV: Regular rate and rhythm with no murmurs appreciated Pulm: Normal work of breathing, clear to auscultation bilaterally with no crackles, wheezes, or rhonchi GI: Normal bowel sounds present. Soft, Nontender, Nondistended. GU: Normal male genitalia.  Left side of scrotum with swelling.  Testicle palpated without pain or masses. MSK: No edema, cyanosis, or clubbing noted Skin: Warm, dry Neuro: Grossly normal, moves all extremities Psych: Normal affect and thought content      Carlos Heber M. [name], MD 06/20/2019 8:27 AM

## 2019-06-20 NOTE — Assessment & Plan Note (Signed)
Symptoms have been unchanged for the past year.  Given that he is having no pain and that symptoms are unchanged, I think it is reasonable to continue with watchful waiting.  Advised him to let us know if symptoms change in any way.

## 2019-06-20 NOTE — Assessment & Plan Note (Addendum)
Rate controlled.  Regular rhythm today.  Continue verapamil 180 mg daily.

## 2019-06-20 NOTE — Assessment & Plan Note (Signed)
At goal.  Continue verapamil 100 mg daily.

## 2019-06-20 NOTE — Assessment & Plan Note (Signed)
Stable.  Continue methotrexate 10 mg daily.  Continue GI protection with Prilosec 20 mg daily.  Also continue folic acid 267 mcg daily.

## 2019-06-21 ENCOUNTER — Encounter: Payer: Self-pay | Admitting: Family Medicine

## 2019-06-21 DIAGNOSIS — E785 Hyperlipidemia, unspecified: Secondary | ICD-10-CM | POA: Insufficient documentation

## 2019-06-21 DIAGNOSIS — R739 Hyperglycemia, unspecified: Secondary | ICD-10-CM | POA: Insufficient documentation

## 2019-06-21 LAB — HEPATITIS C ANTIBODY
Hepatitis C Ab: NONREACTIVE
SIGNAL TO CUT-OFF: 0.02 (ref <1.00)

## 2019-06-21 NOTE — Progress Notes (Signed)
Please inform patient of the following:  Cholesterol is elevated. Recommend starting lipitor 40mg  daily to improve numbers and lower risk of heart attack and stroke. Please send in for pt.  Blood sugar borderline. Do not need to start meds. Should continue to work on diet and exercise and we can recheck in a year.  All other labs normal.  Max M. [name], MD 06/21/2019 4:46 PM

## 2019-08-10 ENCOUNTER — Other Ambulatory Visit: Payer: Self-pay

## 2019-08-10 MED ORDER — ACYCLOVIR 800 MG PO TABS: 800.0000 mg | ORAL_TABLET | Freq: Three times a day (TID) | ORAL | 1 refills | Status: DC | PRN

## 2019-08-15 ENCOUNTER — Telehealth: Payer: Self-pay | Admitting: Family Medicine

## 2019-08-15 NOTE — Telephone Encounter (Signed)
I left a message asking the patient to call me at [phone] to schedule AWV with Courtney. VDM (Dee-Dee) °

## 2019-08-18 DIAGNOSIS — Z23 Encounter for immunization: Secondary | ICD-10-CM | POA: Diagnosis not present

## 2019-08-19 ENCOUNTER — Ambulatory Visit (INDEPENDENT_AMBULATORY_CARE_PROVIDER_SITE_OTHER): Payer: Medicare Other

## 2019-08-19 ENCOUNTER — Other Ambulatory Visit: Payer: Self-pay

## 2019-08-19 VITALS — BP 134/72

## 2019-08-19 DIAGNOSIS — Z0000 Encounter for general adult medical examination without abnormal findings: Secondary | ICD-10-CM | POA: Diagnosis not present

## 2019-08-19 NOTE — Progress Notes (Signed)
This visit is being conducted via phone call due to the COVID-19 pandemic. This patient has given me verbal consent via phone to conduct this visit, patient states they are participating from their home address. Some vital signs may be absent or patient reported.   Patient identification: identified by name, DOB, and current address.    Subjective:   Max [name] is a 68 y.o. male who presents for Medicare Annual/Subsequent preventive examination.  Review of Systems:   Cardiac Risk Factors include: advanced age (>43men, >109 women);male gender;hypertension     Objective:    Vitals: There were no vitals taken for this visit.  There is no height or weight on file to calculate BMI.  Advanced Directives 08/19/2019 02/29/2016 02/26/2016 02/04/2014 07/14/2012  Does Patient Have a Medical Advance Directive? Yes No No Patient does not have advance directive Patient does not have advance directive;Patient would like information  Type of Advance Directive Living will;Healthcare Power of Attorney - - - -  Does patient want to make changes to medical advance directive? No - Patient declined - - - -  Copy of Mona in Chart? No - copy requested - - - -  Would patient like information on creating a medical advance directive? - - - - Referral made to social work  Pre-existing out of facility DNR order (yellow form or pink MOST form) - - - No -    Tobacco Social History   Tobacco Use  Smoking Status Never Smoker  Smokeless Tobacco Never Used      Clinical Intake:  Pre-visit preparation completed: Yes  Pain : No/denies pain  Diabetes: No  How often do you need to have someone help you when you read instructions, pamphlets, or other written materials from your doctor or pharmacy?: 1 - Never  Interpreter Needed?: No  Information entered by :: Denman George LPN  Past Medical History:  Diagnosis Date  . Abdominal hernia 11/28/2015  . Allergic rhinitis   .  Anxiety   . BPV (benign positional vertigo), right 05/11/2018  . Cardiac arrhythmia   . ED (erectile dysfunction)   . Herpes   . History of syncope    in the setting of anxiety per cardiology note  . Hypertension   . Kidney tumor (benign), right    sx 02/27/2015  . Meningioma ([facility])    had surgery   . Paroxysmal atrial fibrillation ([facility]) 07/15/2012   a) CHADSVASc score 1b) On full-dose ASA and Verapamil  . RA (rheumatoid arthritis) ([facility])   . 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 Vasovagal syncope    Past Surgical History:  Procedure Laterality Date  . BRAIN MENINGIOMA EXCISION  2012  . CATARACT EXTRACTION W/ INTRAOCULAR LENS IMPLANT  ~ 2001   right  . COLONOSCOPY  2008  . EYE SURGERY  1964   right; "hit w/baseball; eye hemorrhaged"  . FLEXIBLE SIGMOIDOSCOPY    . REFRACTIVE SURGERY  ~ 2003  . ROBOTIC ASSITED PARTIAL NEPHRECTOMY Right 02/29/2016   Procedure: XI ROBOTIC ASSITED PARTIAL NEPHRECTOMY;  Surgeon: Alexis Frock, MD;  Location: [facility];  Service: Urology;  Laterality: Right;   NEEDS ULTRASOUND   Family History  Problem Relation Age of Onset  . Depression Other   . Hypertension Other   . GI problems Other   . Lung disease Other   . Prostate cancer Brother   . Prostate cancer Paternal Grandfather   . Colon cancer  Neg Hx   . Esophageal cancer Neg Hx   . Ulcerative colitis Neg Hx    Social History   Socioeconomic History  . Marital status: Married    Spouse name: Not on file  . Number of children: 1  . Years of education: Not on file  . Highest education level: Not on file  Occupational History    Comment: [employer]   Social Needs  . Financial resource strain: Not on file  . Food insecurity    Worry: Not on file    Inability: Not on file  . Transportation needs    Medical: Not on file    Non-medical: Not on file  Tobacco Use  . Smoking status: Never Smoker  . Smokeless tobacco: Never Used  Substance and Sexual  Activity  . Alcohol use: No  . Drug use: No  . Sexual activity: Yes  Lifestyle  . Physical activity    Days per week: Not on file    Minutes per session: Not on file  . Stress: Not on file  Relationships  . Social Herbalist on phone: Not on file    Gets together: Not on file    Attends religious service: Not on file    Active member of club or organization: Not on file    Attends meetings of clubs or organizations: Not on file    Relationship status: Not on file  Other Topics Concern  . Not on file  Social History Narrative   Regular Exercise    Outpatient Encounter Medications as of 08/19/2019  Medication Sig  . acyclovir (ZOVIRAX) 800 MG tablet Take 1 tablet (800 mg total) by mouth 3 (three) times daily as needed. For outbreaks  . Ascorbic Acid (VITAMIN C PO) Take 1 tablet by mouth daily.  Marland Kitchen aspirin 81 MG chewable tablet Chew by mouth daily.  . folic acid (FOLVITE) 914 MCG tablet Take 400 mcg by mouth daily.  . methotrexate (RHEUMATREX) 2.5 MG tablet TAKE 4 TABLETS BY MOUTH WEEKLY  . Multiple Vitamin (MULTIVITAMIN) tablet Take 1 tablet by mouth daily.  Marland Kitchen omeprazole (PRILOSEC) 20 MG capsule Take 1 capsule (20 mg total) by mouth daily.  . sildenafil (REVATIO) 20 MG tablet Take 1 to 2 tabs 2 - 3 hours before sex  . verapamil (CALAN-SR) 180 MG CR tablet Take 1 tablet (180 mg total) by mouth daily.   No facility-administered encounter medications on file as of 08/19/2019.     Activities of Daily Living In your present state of health, do you have any difficulty performing the following activities: 08/19/2019  Hearing? N  Vision? N  Difficulty concentrating or making decisions? N  Walking or climbing stairs? N  Dressing or bathing? N  Doing errands, shopping? N  Preparing Food and eating ? N  Using the Toilet? N  In the past six months, have you accidently leaked urine? N  Do you have problems with loss of bowel control? N  Managing your Medications? N  Managing  your Finances? N  Housekeeping or managing your Housekeeping? N  Some recent data might be hidden    Patient Care Team: Vivi Barrack, MD as PCP - General (Family Medicine) Leeroy Cha, MD as Consulting Physician (Neurosurgery) Marshell Garfinkel, MD as Consulting Physician (Pulmonary Disease) Hortencia Pilar, MD as Consulting Physician (Ophthalmology)   Assessment:   This is a routine wellness examination for Gwyndolyn Saxon.  Exercise Activities and Dietary recommendations Current Exercise Habits: Home  exercise routine, Type of exercise: walking, Time (Minutes): 30, Frequency (Times/Week): 7, Weekly Exercise (Minutes/Week): 210, Intensity: Mild  Goals   None     Fall Risk Fall Risk  08/19/2019 05/11/2018 03/30/2017 11/28/2015 11/28/2015  Falls in the past year? 0 No No No Yes  Number falls in past yr: 0 - - - 1  Injury with Fall? 0 - - - No  Follow up Education provided - - - -   Is the patient's home free of loose throw rugs in walkways, pet beds, electrical cords, etc?   yes      Grab bars in the bathroom? yes      Handrails on the stairs?   yes      Adequate lighting?   yes  Depression Screen PHQ 2/9 Scores 08/19/2019 01/28/2019 05/11/2018 03/30/2017  PHQ - 2 Score 0 0 0 0    Cognitive Function-no cognitive concerns at this time      6CIT Screen 08/19/2019  What Year? 0 points  What month? 0 points  What time? 0 points  Count back from 20 0 points  Months in reverse 0 points  Repeat phrase 0 points  Total Score 0    Immunization History  Administered Date(s) Administered  . Influenza Split 12/18/2011  . Influenza Whole 10/18/2009, 10/22/2010  . Influenza, High Dose Seasonal PF 11/28/2015  . Influenza, Seasonal, Injecte, Preservative Fre 12/20/2012  . Influenza,inj,Quad PF,6+ Mos 12/21/2013, 02/08/2015  . Influenza-Unspecified 10/10/2018  . Pneumococcal Conjugate-13 03/30/2017  . Pneumococcal Polysaccharide-23 03/01/2016  . Td 12/15/1998, 10/18/2009     Qualifies for Shingles Vaccine? Discussed and patient will check with pharmacy for coverage.  Patient education handout provided    Screening Tests Health Maintenance  Topic Date Due  . INFLUENZA VACCINE  07/16/2019  . TETANUS/TDAP  10/19/2019  . COLONOSCOPY  04/16/2028  . Hepatitis C Screening  Completed  . PNA vac Low Risk Adult  Completed   Cancer Screenings: Lung: Low Dose CT Chest recommended if Age 59-80 years, 30 pack-year currently smoking OR have quit w/in 15years. Patient does not qualify. Colorectal: completed 04/16/18 with Dr. Ardis Hughs       Plan:    I have personally reviewed and addressed the Medicare Annual Wellness questionnaire and have noted the following in the patient's chart:  A. Medical and social history B. Use of alcohol, tobacco or illicit drugs  C. Current medications and supplements D. Functional ability and status E.  Nutritional status F.  Physical activity G. Advance directives H. List of other physicians I.  Hospitalizations, surgeries, and ER visits in previous 12 months J.  [place] such as hearing and vision if needed, cognitive and depression L. Referrals, records requested, and appointments- none   In addition, I have reviewed and discussed with patient certain preventive protocols, quality metrics, and best practice recommendations. A written personalized care plan for preventive services as well as general preventive health recommendations were provided to patient.   Signed,  Denman George, LPN  Nurse Health Advisor   Nurse Notes: no additional

## 2019-08-19 NOTE — Progress Notes (Signed)
I have personally reviewed the Medicare Annual Wellness Visit and agree with the assessment and plan.  Algis Greenhouse. [name], MD 08/19/2019 9:21 AM

## 2019-08-19 NOTE — Patient Instructions (Addendum)
Max [name] , Thank you for taking time to come for your Medicare Wellness Visit. I appreciate your ongoing commitment to your health goals. Please review the following plan we discussed and let me know if I can assist you in the future.   Screening recommendations/referrals: Colorectal Screening: completed 04/16/18 with Dr. Kathlen Mody   Vision and Dental Exams: Recommended annual ophthalmology exams for early detection of glaucoma and other disorders of the eye Recommended annual dental exams for proper oral hygiene  Vaccinations: Influenza vaccine: completed at pharmacy  Pneumococcal vaccine: completed 11/02/17 Tdap vaccine: up to date last 10/18/09 Shingles vaccine: Please call your insurance company to determine your out of pocket expense for the Shingrix vaccine. You may receive this vaccine at your local pharmacy.  Advanced directives: Please bring a copy of your POA (Power of Attorney) and/or Living Will to your next appointment.  Goals: Recommend to drink at least 6-8 8oz glasses of water per day.  Next appointment: Please schedule your Annual Wellness Visit with your Nurse Health Advisor in one year.  Preventive Care 69 Years and Older, Male Preventive care refers to lifestyle choices and visits with your health care provider that can promote health and wellness. What does preventive care include?  A yearly physical exam. This is also called an annual well check.  Dental exams once or twice a year.  Routine eye exams. Ask your health care provider how often you should have your eyes checked.  Personal lifestyle choices, including:  Daily care of your teeth and gums.  Regular physical activity.  Eating a healthy diet.  Avoiding tobacco and drug use.  Limiting alcohol use.  Practicing safe sex.  Taking low doses of aspirin every day if recommended by your health care provider..  Taking vitamin and mineral supplements as recommended by your health care provider. What  happens during an annual well check? The services and screenings done by your health care provider during your annual well check will depend on your age, overall health, lifestyle risk factors, and family history of disease. Counseling  Your health care provider may ask you questions about your:  Alcohol use.  Tobacco use.  Drug use.  Emotional well-being.  Home and relationship well-being.  Sexual activity.  Eating habits.  History of falls.  Memory and ability to understand (cognition).  Work and work Statistician. Screening  You may have the following tests or measurements:  Height, weight, and BMI.  Blood pressure.  Lipid and cholesterol levels. These may be checked every 5 years, or more frequently if you are over 2 years old.  Skin check.  Lung cancer screening. You may have this screening every year starting at age 69 if you have a 30-pack-year history of smoking and currently smoke or have quit within the past 15 years.  Fecal occult blood test (FOBT) of the stool. You may have this test every year starting at age 69.  Flexible sigmoidoscopy or colonoscopy. You may have a sigmoidoscopy every 5 years or a colonoscopy every 10 years starting at age 69.  Prostate cancer screening. Recommendations will vary depending on your family history and other risks.  Hepatitis C blood test.  Hepatitis B blood test.  Sexually transmitted disease (STD) testing.  Diabetes screening. This is done by checking your blood sugar (glucose) after you have not eaten for a while (fasting). You may have this done every 1-3 years.  Abdominal aortic aneurysm (AAA) screening. You may need this if you are a current or former  smoker.  Osteoporosis. You may be screened starting at age 69 if you are at high risk. Talk with your health care provider about your test results, treatment options, and if necessary, the need for more tests. Vaccines  Your health care provider may recommend  certain vaccines, such as:  Influenza vaccine. This is recommended every year.  Tetanus, diphtheria, and acellular pertussis (Tdap, Td) vaccine. You may need a Td booster every 10 years.  Zoster vaccine. You may need this after age 55.  Pneumococcal 13-valent conjugate (PCV13) vaccine. One dose is recommended after age 69.  Pneumococcal polysaccharide (PPSV23) vaccine. One dose is recommended after age 69. Talk to your health care provider about which screenings and vaccines you need and how often you need them. This information is not intended to replace advice given to you by your health care provider. Make sure you discuss any questions you have with your health care provider. Document Released: 12/28/2015 Document Revised: 08/20/2016 Document Reviewed: 10/02/2015 Elsevier Interactive Patient Education  2017 [place] Prevention in the Home Falls can cause injuries. They can happen to people of all ages. There are many things you can do to make your home safe and to help prevent falls. What can I do on the outside of my home?  Regularly fix the edges of walkways and driveways and fix any cracks.  Remove anything that might make you trip as you walk through a door, such as a raised step or threshold.  Trim any bushes or trees on the path to your home.  Use bright outdoor lighting.  Clear any walking paths of anything that might make someone trip, such as rocks or tools.  Regularly check to see if handrails are loose or broken. Make sure that both sides of any steps have handrails.  Any raised decks and porches should have guardrails on the edges.  Have any leaves, snow, or ice cleared regularly.  Use sand or salt on walking paths during winter.  Clean up any spills in your garage right away. This includes oil or grease spills. What can I do in the bathroom?  Use night lights.  Install grab bars by the toilet and in the tub and shower. Do not use towel bars as  grab bars.  Use non-skid mats or decals in the tub or shower.  If you need to sit down in the shower, use a plastic, non-slip stool.  Keep the floor dry. Clean up any water that spills on the floor as soon as it happens.  Remove soap buildup in the tub or shower regularly.  Attach bath mats securely with double-sided non-slip rug tape.  Do not have throw rugs and other things on the floor that can make you trip. What can I do in the bedroom?  Use night lights.  Make sure that you have a light by your bed that is easy to reach.  Do not use any sheets or blankets that are too big for your bed. They should not hang down onto the floor.  Have a firm chair that has side arms. You can use this for support while you get dressed.  Do not have throw rugs and other things on the floor that can make you trip. What can I do in the kitchen?  Clean up any spills right away.  Avoid walking on wet floors.  Keep items that you use a lot in easy-to-reach places.  If you need to reach something above you, use a  strong step stool that has a grab bar.  Keep electrical cords out of the way.  Do not use floor polish or wax that makes floors slippery. If you must use wax, use non-skid floor wax.  Do not have throw rugs and other things on the floor that can make you trip. What can I do with my stairs?  Do not leave any items on the stairs.  Make sure that there are handrails on both sides of the stairs and use them. Fix handrails that are broken or loose. Make sure that handrails are as long as the stairways.  Check any carpeting to make sure that it is firmly attached to the stairs. Fix any carpet that is loose or worn.  Avoid having throw rugs at the top or bottom of the stairs. If you do have throw rugs, attach them to the floor with carpet tape.  Make sure that you have a light switch at the top of the stairs and the bottom of the stairs. If you do not have them, ask someone to add them  for you. What else can I do to help prevent falls?  Wear shoes that:  Do not have high heels.  Have rubber bottoms.  Are comfortable and fit you well.  Are closed at the toe. Do not wear sandals.  If you use a stepladder:  Make sure that it is fully opened. Do not climb a closed stepladder.  Make sure that both sides of the stepladder are locked into place.  Ask someone to hold it for you, if possible.  Clearly mark and make sure that you can see:  Any grab bars or handrails.  First and last steps.  Where the edge of each step is.  Use tools that help you move around (mobility aids) if they are needed. These include:  Canes.  Walkers.  Scooters.  Crutches.  Turn on the lights when you go into a dark area. Replace any light bulbs as soon as they burn out.  Set up your furniture so you have a clear path. Avoid moving your furniture around.  If any of your floors are uneven, fix them.  If there are any pets around you, be aware of where they are.  Review your medicines with your doctor. Some medicines can make you feel dizzy. This can increase your chance of falling. Ask your doctor what other things that you can do to help prevent falls. This information is not intended to replace advice given to you by your health care provider. Make sure you discuss any questions you have with your health care provider. Document Released: 09/27/2009 Document Revised: 05/08/2016 Document Reviewed: 01/05/2015 Elsevier Interactive Patient Education  2017 [employer].

## 2019-10-25 DIAGNOSIS — H5319 Other subjective visual disturbances: Secondary | ICD-10-CM | POA: Diagnosis not present

## 2019-12-07 ENCOUNTER — Ambulatory Visit: Payer: Medicare Other | Attending: Internal Medicine

## 2019-12-07 ENCOUNTER — Ambulatory Visit (INDEPENDENT_AMBULATORY_CARE_PROVIDER_SITE_OTHER): Payer: Medicare Other | Admitting: Family Medicine

## 2019-12-07 DIAGNOSIS — R059 Cough, unspecified: Secondary | ICD-10-CM

## 2019-12-07 DIAGNOSIS — R05 Cough: Secondary | ICD-10-CM | POA: Diagnosis not present

## 2019-12-07 DIAGNOSIS — U071 COVID-19: Secondary | ICD-10-CM

## 2019-12-07 NOTE — Progress Notes (Signed)
    Chief Complaint:  Max [name] is a 68 y.o. male who presents for a telephone visit with a chief complaint of fever.   Assessment/Plan:  Cough Covid test pending.  Seems to be improving-do not need further evaluation or management this time.  Discussed reasons to return to care.  Follow-up as needed.    Subjective:  HPI:  Fever  Started 2 days ago.  Prior to that he was cleaning out a car and inhaled gross particles.  Thought that inhaling rest particles caused him to have a headache.  Over last couple days symptoms seem to be improving.  He had a cough and a low-grade fever.  He was tested for Covid this morning.  ROS: Per HPI  PMH: He reports that he has never smoked. He has never used smokeless tobacco. He reports that he does not drink alcohol or use drugs.      Objective/Observations   No results found for this or any previous visit (from the past 72 hour(s)).   Telephone Visit   I connected with Max [name] on 12/07/19 at 11:00 AM EST via telephone and verified that I am speaking with the correct person using two identifiers. I discussed the limitations of evaluation and management by telemedicine and the availability of in person appointments. The patient expressed understanding and agreed to proceed.   Patient location: Home Provider location: [place] participating in the virtual visit: Myself and Patient  A total of 5 minutes were spent on medical discussion.      Algis Greenhouse. [name], MD 12/07/2019 11:28 AM

## 2019-12-09 LAB — NOVEL CORONAVIRUS, NAA: SARS-CoV-2, NAA: DETECTED — AB

## 2019-12-11 ENCOUNTER — Encounter (HOSPITAL_COMMUNITY): Payer: Self-pay | Admitting: Emergency Medicine

## 2019-12-11 ENCOUNTER — Inpatient Hospital Stay (HOSPITAL_COMMUNITY)
Admission: EM | Admit: 2019-12-11 | Discharge: 2019-12-13 | DRG: 177 | Disposition: A | Discharge status: Home / Self Care | Payer: Medicare Other | Attending: Internal Medicine | Admitting: Internal Medicine

## 2019-12-11 ENCOUNTER — Emergency Department (HOSPITAL_COMMUNITY): Payer: Medicare Other

## 2019-12-11 DIAGNOSIS — Z8249 Family history of ischemic heart disease and other diseases of the circulatory system: Secondary | ICD-10-CM

## 2019-12-11 DIAGNOSIS — Z905 Acquired absence of kidney: Secondary | ICD-10-CM

## 2019-12-11 DIAGNOSIS — M069 Rheumatoid arthritis, unspecified: Secondary | ICD-10-CM | POA: Diagnosis present

## 2019-12-11 DIAGNOSIS — J1289 Other viral pneumonia: Secondary | ICD-10-CM | POA: Diagnosis present

## 2019-12-11 DIAGNOSIS — U071 COVID-19: Secondary | ICD-10-CM | POA: Diagnosis not present

## 2019-12-11 DIAGNOSIS — M06042 Rheumatoid arthritis without rheumatoid factor, left hand: Secondary | ICD-10-CM

## 2019-12-11 DIAGNOSIS — E785 Hyperlipidemia, unspecified: Secondary | ICD-10-CM | POA: Diagnosis present

## 2019-12-11 DIAGNOSIS — Z7982 Long term (current) use of aspirin: Secondary | ICD-10-CM | POA: Diagnosis not present

## 2019-12-11 DIAGNOSIS — I480 Paroxysmal atrial fibrillation: Secondary | ICD-10-CM

## 2019-12-11 DIAGNOSIS — Z885 Allergy status to narcotic agent status: Secondary | ICD-10-CM

## 2019-12-11 DIAGNOSIS — R911 Solitary pulmonary nodule: Secondary | ICD-10-CM | POA: Diagnosis present

## 2019-12-11 DIAGNOSIS — J9601 Acute respiratory failure with hypoxia: Secondary | ICD-10-CM | POA: Diagnosis present

## 2019-12-11 DIAGNOSIS — I10 Essential (primary) hypertension: Secondary | ICD-10-CM | POA: Diagnosis present

## 2019-12-11 DIAGNOSIS — H8110 Benign paroxysmal vertigo, unspecified ear: Secondary | ICD-10-CM | POA: Diagnosis present

## 2019-12-11 DIAGNOSIS — M06041 Rheumatoid arthritis without rheumatoid factor, right hand: Secondary | ICD-10-CM | POA: Diagnosis not present

## 2019-12-11 DIAGNOSIS — I951 Orthostatic hypotension: Secondary | ICD-10-CM | POA: Diagnosis present

## 2019-12-11 DIAGNOSIS — Z79899 Other long term (current) drug therapy: Secondary | ICD-10-CM | POA: Diagnosis not present

## 2019-12-11 DIAGNOSIS — Z86011 Personal history of benign neoplasm of the brain: Secondary | ICD-10-CM | POA: Diagnosis not present

## 2019-12-11 DIAGNOSIS — B009 Herpesviral infection, unspecified: Secondary | ICD-10-CM | POA: Diagnosis present

## 2019-12-11 DIAGNOSIS — F419 Anxiety disorder, unspecified: Secondary | ICD-10-CM | POA: Diagnosis present

## 2019-12-11 DIAGNOSIS — J309 Allergic rhinitis, unspecified: Secondary | ICD-10-CM | POA: Diagnosis present

## 2019-12-11 LAB — CBC WITH DIFFERENTIAL/PLATELET
Abs Immature Granulocytes: 0.06 10*3/uL (ref 0.00–0.07)
Basophils Absolute: 0 10*3/uL (ref 0.0–0.1)
Basophils Relative: 0 %
Eosinophils Absolute: 0 10*3/uL (ref 0.0–0.5)
Eosinophils Relative: 0 %
HCT: 44.6 % (ref 39.0–52.0)
Hemoglobin: 14.9 g/dL (ref 13.0–17.0)
Immature Granulocytes: 1 %
Lymphocytes Relative: 12 %
Lymphs Abs: 0.6 10*3/uL — ABNORMAL LOW (ref 0.7–4.0)
MCH: 33.6 pg (ref 26.0–34.0)
MCHC: 33.4 g/dL (ref 30.0–36.0)
MCV: 100.5 fL — ABNORMAL HIGH (ref 80.0–100.0)
Monocytes Absolute: 0.4 10*3/uL (ref 0.1–1.0)
Monocytes Relative: 8 %
Neutro Abs: 3.8 10*3/uL (ref 1.7–7.7)
Neutrophils Relative %: 79 %
Platelets: 277 10*3/uL (ref 150–400)
RBC: 4.44 MIL/uL (ref 4.22–5.81)
RDW: 13.2 % (ref 11.5–15.5)
WBC: 4.9 10*3/uL (ref 4.0–10.5)
nRBC: 0 % (ref 0.0–0.2)

## 2019-12-11 LAB — FIBRINOGEN: Fibrinogen: 699 mg/dL — ABNORMAL HIGH (ref 210–475)

## 2019-12-11 LAB — COMPREHENSIVE METABOLIC PANEL
ALT: 44 U/L (ref 0–44)
AST: 40 U/L (ref 15–41)
Albumin: 3.5 g/dL (ref 3.5–5.0)
Alkaline Phosphatase: 87 U/L (ref 38–126)
Anion gap: 12 (ref 5–15)
BUN: 18 mg/dL (ref 8–23)
CO2: 25 mmol/L (ref 22–32)
Calcium: 8.4 mg/dL — ABNORMAL LOW (ref 8.9–10.3)
Chloride: 100 mmol/L (ref 98–111)
Creatinine, Ser: 0.93 mg/dL (ref 0.61–1.24)
GFR calc Af Amer: >60 mL/min (ref >60)
GFR calc non Af Amer: >60 mL/min (ref >60)
Glucose, Bld: 110 mg/dL — ABNORMAL HIGH (ref 70–99)
Potassium: 4 mmol/L (ref 3.5–5.1)
Sodium: 137 mmol/L (ref 135–145)
Total Bilirubin: 0.5 mg/dL (ref 0.3–1.2)
Total Protein: 7.2 g/dL (ref 6.5–8.1)

## 2019-12-11 LAB — FERRITIN: Ferritin: 404 ng/mL — ABNORMAL HIGH (ref 24–336)

## 2019-12-11 LAB — D-DIMER, QUANTITATIVE: D-Dimer, Quant: 1.14 ug/mL-FEU — ABNORMAL HIGH (ref 0.00–0.50)

## 2019-12-11 LAB — LACTIC ACID, PLASMA
Lactic Acid, Venous: 1.7 mmol/L (ref 0.5–1.9)
Lactic Acid, Venous: 1.2 mmol/L (ref 0.5–1.9)

## 2019-12-11 LAB — LACTATE DEHYDROGENASE: LDH: 214 U/L — ABNORMAL HIGH (ref 98–192)

## 2019-12-11 LAB — TRIGLYCERIDES: Triglycerides: 114 mg/dL (ref <150)

## 2019-12-11 LAB — PROCALCITONIN: Procalcitonin: <0.1 ng/mL

## 2019-12-11 LAB — C-REACTIVE PROTEIN: CRP: 3.1 mg/dL — ABNORMAL HIGH (ref <1.0)

## 2019-12-11 MED ORDER — SODIUM CHLORIDE 0.9 % IV BOLUS
500.0000 mL | Freq: Once | INTRAVENOUS | Status: AC
  Administered 2019-12-11: 500 mL via INTRAVENOUS

## 2019-12-11 MED ORDER — ADULT MULTIVITAMIN W/MINERALS CH
1.0000 | ORAL_TABLET | Freq: Every day | ORAL | Status: DC
  Administered 2019-12-12 – 2019-12-13 (×3): 1 via ORAL
  Filled 2019-12-11 (×4): qty 1

## 2019-12-11 MED ORDER — DEXAMETHASONE SODIUM PHOSPHATE 10 MG/ML IJ SOLN
10.0000 mg | Freq: Once | INTRAMUSCULAR | Status: AC
  Administered 2019-12-11: 10 mg via INTRAVENOUS
  Filled 2019-12-11: qty 1

## 2019-12-11 MED ORDER — ASPIRIN 81 MG PO CHEW
81.0000 mg | CHEWABLE_TABLET | Freq: Every day | ORAL | Status: DC
  Administered 2019-12-12 – 2019-12-13 (×2): 81 mg via ORAL
  Filled 2019-12-11 (×3): qty 1

## 2019-12-11 MED ORDER — SODIUM CHLORIDE 0.9 % IV SOLN
200.0000 mg | Freq: Once | INTRAVENOUS | Status: AC
  Administered 2019-12-11: 200 mg via INTRAVENOUS
  Filled 2019-12-11: qty 200

## 2019-12-11 MED ORDER — METHOTREXATE 2.5 MG PO TABS: 10.0000 mg | ORAL_TABLET | ORAL | Status: DC

## 2019-12-11 MED ORDER — FOLIC ACID 0.5 MG HALF TAB
500.0000 ug | ORAL_TABLET | Freq: Every day | ORAL | Status: DC
  Administered 2019-12-12: 0.5 mg via ORAL
  Filled 2019-12-11 (×2): qty 1

## 2019-12-11 MED ORDER — SODIUM CHLORIDE 0.9 % IV SOLN
100.0000 mg | Freq: Every day | INTRAVENOUS | Status: DC
  Administered 2019-12-12 – 2019-12-13 (×2): 100 mg via INTRAVENOUS
  Filled 2019-12-11 (×2): qty 100

## 2019-12-11 MED ORDER — PANTOPRAZOLE SODIUM 40 MG PO TBEC
40.0000 mg | DELAYED_RELEASE_TABLET | Freq: Every day | ORAL | Status: DC
  Administered 2019-12-12 – 2019-12-13 (×2): 40 mg via ORAL
  Filled 2019-12-11 (×2): qty 1

## 2019-12-11 MED ORDER — VERAPAMIL HCL ER 180 MG PO TBCR
180.0000 mg | EXTENDED_RELEASE_TABLET | Freq: Every day | ORAL | Status: DC
  Administered 2019-12-12 – 2019-12-13 (×2): 180 mg via ORAL
  Filled 2019-12-11 (×2): qty 1

## 2019-12-11 MED ORDER — SODIUM CHLORIDE 0.9 % IV SOLN
500.0000 mg | Freq: Once | INTRAVENOUS | Status: AC
  Administered 2019-12-11: 500 mg via INTRAVENOUS
  Filled 2019-12-11: qty 500

## 2019-12-11 MED ORDER — SODIUM CHLORIDE 0.9 % IV SOLN
1.0000 g | Freq: Once | INTRAVENOUS | Status: AC
  Administered 2019-12-11: 1 g via INTRAVENOUS
  Filled 2019-12-11: qty 10

## 2019-12-11 NOTE — ED Triage Notes (Signed)
Pt BIBA from home.   Pt had COVID symptoms since 12/21. Pt was COVID+ 12/24 . Pt states today he had a near syncopal episode today after walking around at home.   Pt had + orthostatic changes.  134/73 HR 69 sitting 97/50 HR 87 standing  93% RA 99% 3L FSBG 149 98.6  20 L hand 400 cc NS

## 2019-12-11 NOTE — ED Notes (Signed)
Updated patients wife on plan of care.

## 2019-12-11 NOTE — ED Notes (Signed)
Per [place], [place] place patient on 2 liters.   Patient is 94% on RA.

## 2019-12-11 NOTE — Progress Notes (Signed)
Pharmacy - Medication note  Assessment: 31 yoM admitted with COVID+ and syncope. Home methotrexate for RA reordered on admission  Methotrexate (Trexall; Rheumatrex) hold criteria  Hgb < 8  WBC < 3  Pltc < 100K  SCr > 1.5x baseline (or > 2 if baseline unknown)  AST or ALT >3x ULN  Bili > 1.5x ULN  Ascites or pleural effusion  Diarrhea - Grade 2 or higher  Ulcerative stomatitis  Unexplained pneumonitis / hypoxemia  Active infection   Plan: Patient's methotrexate order will be discontinued due to active infection. Hold may be overridden with approval from prescribing specialist (ie Rheumatologist)  Reuel Boom, PharmD, BCPS [phone] 12/11/2019, 9:31 PM

## 2019-12-11 NOTE — H&P (Signed)
History and Physical  Max [name] JKK:[id number] DOB: Jul 13, 1951 DOA: 12/11/2019   Patient coming from: Home & is able to ambulate   Chief Complaint: Near syncope, Covid positive  HPI: Max [name] is a 68 y.o. male with medical history significant for paroxysmal A. 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  Due to his symptoms, patient got tested at [facility], which came back positive on 12/07/2019.  Patient have been using Tylenol, cough suppressants for his symptoms.  Patient continues to have worsening symptoms, most notable for significant shortness of breath, generalized weakness and an episode of possible syncope for a few seconds as described by his spouse.  Of note, spouse was diagnosed with COVID-19 back in November, and at that time patient tested negative.  Due to worsening symptoms, with near syncope, EMS was called, patient was noted to be orthostatic and saturating around 93% on room air.  Patient denies any chest pain, abdominal pain, nausea/vomiting, diarrhea.    ED Course: In the ED, temp noted to be about 100.4, saturating 93% on room air.  Lab showed ferritin 404, CRP 3.1, procalcitonin negative, lactic acid within normal limits, no leukocytosis.  Chest x-ray showed new faint infiltrate at both lung bases, with atelectasis.  Patient started on Decadron, and remdesivir.  Patient admitted for further management  Review of Systems: Review of systems are otherwise negative   Past Medical History:  Diagnosis Date  . Abdominal hernia 11/28/2015  . Allergic rhinitis   . Anxiety   . BPV (benign positional vertigo), right 05/11/2018  . Cardiac arrhythmia   . ED (erectile dysfunction)   . Herpes   . History of syncope    in the setting of anxiety  per cardiology note  . Hypertension   . Kidney tumor (benign), right    sx 02/27/2015  . Meningioma ([place])    had surgery   . Paroxysmal atrial fibrillation (HCC) 07/15/2012   a) CHADSVASc score 1b) On full-dose ASA and Verapamil  . RA (rheumatoid arthritis) ([place])   . 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 Vasovagal syncope    Past Surgical History:  Procedure Laterality Date  . BRAIN MENINGIOMA EXCISION  2012  . CATARACT EXTRACTION W/ INTRAOCULAR LENS IMPLANT  ~ 2001   right  . COLONOSCOPY  2008  . EYE SURGERY  1964   right; "hit w/baseball; eye hemorrhaged"  . FLEXIBLE SIGMOIDOSCOPY    . REFRACTIVE SURGERY  ~ 2003  . ROBOTIC ASSITED PARTIAL NEPHRECTOMY Right 02/29/2016   Procedure: XI ROBOTIC ASSITED PARTIAL NEPHRECTOMY;  Surgeon: Alexis Frock, MD;  Location: [facility];  Service: Urology;  Laterality: Right;   NEEDS ULTRASOUND    Social History:  reports that he has never smoked. He has never used smokeless tobacco. He reports that he does not drink alcohol or use drugs.   Allergies  Allergen Reactions  . Vicodin [Hydrocodone-Acetaminophen] Nausea And Vomiting    Family History  Problem Relation Age of Onset  . Depression Other   . Hypertension Other   . GI problems Other   . Lung disease Other   . Prostate cancer Brother   . Prostate cancer Paternal Grandfather   . Colon cancer Neg Hx   . Esophageal cancer Neg Hx   . Ulcerative  colitis Neg Hx       Prior to Admission medications   Medication Sig Start Date End Date Taking? Authorizing Provider  acyclovir (ZOVIRAX) 800 MG tablet Take 1 tablet (800 mg total) by mouth 3 (three) times daily as needed. For outbreaks 08/10/19  Yes Vivi Barrack, MD  Ascorbic Acid (VITAMIN C PO) Take 1 tablet by mouth daily.   Yes [provider]  aspirin 81 MG chewable tablet Chew by mouth daily.   Yes [provider]  folic acid (FOLVITE) 664 MCG tablet Take 400 mcg by mouth daily.    Yes [provider]  methotrexate (RHEUMATREX) 2.5 MG tablet TAKE 4 TABLETS BY MOUTH WEEKLY Patient taking differently: Take 10 mg by mouth once a week. Takes at bedtime to reduce stomach problems 06/20/19  Yes Vivi Barrack, MD  Multiple Vitamin (MULTIVITAMIN) tablet Take 1 tablet by mouth daily.   Yes [provider]  omeprazole (PRILOSEC) 20 MG capsule Take 1 capsule (20 mg total) by mouth daily. 01/28/19  Yes Vivi Barrack, MD  sildenafil (REVATIO) 20 MG tablet Take 1 to 2 tabs 2 - 3 hours before sex Patient taking differently: Take 20-40 mg by mouth See admin instructions. Take 20mg -40mg  2-3 hours prior to sexual activity. 05/11/18  Yes Dorena Cookey, MD  verapamil (CALAN-SR) 180 MG CR tablet Take 1 tablet (180 mg total) by mouth daily. 06/20/19  Yes Vivi Barrack, MD    Physical Exam: BP (!) 164/79 (BP Location: Right Arm)   Pulse 79   Temp 99.1 F (37.3 C) (Oral)   Resp 20   SpO2 97%   General: Not in acute distress Eyes: Normal ENT: Normal Neck: Supple Cardiovascular: S1, S2 present Respiratory: CTAB Abdomen: Soft, nontender, nondistended, bowel sounds present Skin: Normal, warm and dry Musculoskeletal: No pedal edema bilaterally noted Psychiatric: Normal mood Neurologic: No focal neurologic deficits noted, strength equal in all extremities          Labs on Admission:  Basic Metabolic Panel: Recent Labs  Lab 12/11/19 1352  NA 137  K 4.0  CL 100  CO2 25  GLUCOSE 110*  BUN 18  CREATININE 0.93  CALCIUM 8.4*   Liver Function Tests: Recent Labs  Lab 12/11/19 1352  AST 40  ALT 44  ALKPHOS 87  BILITOT 0.5  PROT 7.2  ALBUMIN 3.5   No results for input(s): LIPASE, AMYLASE in the last 168 hours. No results for input(s): AMMONIA in the last 168 hours. CBC: Recent Labs  Lab 12/11/19 1352  WBC 4.9  NEUTROABS 3.8  HGB 14.9  HCT 44.6  MCV 100.5*  PLT 277   Cardiac Enzymes: No results for input(s): CKTOTAL, CKMB, CKMBINDEX, TROPONINI  in the last 168 hours.  BNP (last 3 results) No results for input(s): BNP in the last 8760 hours.  ProBNP (last 3 results) No results for input(s): PROBNP in the last 8760 hours.  CBG: No results for input(s): GLUCAP in the last 168 hours.  Radiological Exams on Admission: DG Chest Port 1 View  Result Date: 12/11/2019 CLINICAL DATA:  Shortness of breath. COVID-19. EXAM: PORTABLE CHEST 1 VIEW COMPARISON:  Chest x-ray dated 01/17/2016 and chest CT dated 08/20/2018 FINDINGS: There are new faint infiltrates at both lung bases. Heart size and pulmonary vascularity are normal. Atelectasis or effusion at the left lung base is well. No bone abnormality. IMPRESSION: 1. New faint infiltrates at both lung bases. 2. New atelectasis or effusion at the left lung  base. 3. No other significant abnormalities. Electronically Signed   By: Lorriane Shire M.D.   On: 12/11/2019 14:00    EKG: Independently reviewed.  EKG with sinus rhythm  Assessment/Plan Present on Admission: . Pneumonia due to COVID-19 virus . Essential hypertension . Rheumatoid arthritis ([place]) . Paroxysmal atrial fibrillation (HCC)  Principal Problem:   Pneumonia due to COVID-19 virus Active Problems:   Essential hypertension   Rheumatoid arthritis (HCC)   Paroxysmal atrial fibrillation (HCC)   Acute hypoxic respiratory failure 2/2 pneumonia due to COVID-19 virus Currently febrile, with no leukocytosis Saturating 94-97% on room air Inflammatory markers mildly elevated, will trend Procalcitonin negative, lactic acid WNL, D-dimer 1.14 Chest x-ray showed new faint infiltrate at both lung bases, with atelectasis Continue Decadron, start remdesivir Continue cough suppressant as needed, inhalers, zinc, vitamin C Monitor closely  Orthostatic hypotension Had an episode of orthostatic hypotension while at home Status post IV fluids Daily orthostatics PT/OT  ??Paroxysmal A. Fib Wife stated he does not have A. fib, just heart  palpitations Currently in sinus rhythm, heart rate controlled Not on any AC Continue verapamil  Hypertension Continue verapamil as above  Rheumatoid arthritis Continue methotrexate 10 mg weekly every Sunday   Meningioma status post surgery in 2012   Benign right renal mass status post right partial nephrectomy in 2016        DVT prophylaxis: Lovenox  Code Status: Full  Family Communication: Discussed with wife on 12/11/2019  Disposition Plan: To be determined, likely home  Consults called: None  Admission status: Inpatient    Alma Friendly MD [facility]  If 7PM-7AM, please contact night-coverage [email or web address]   12/11/2019, 5:35 PM

## 2019-12-11 NOTE — ED Notes (Signed)
Patients Wife would like an update as soon as possible at [phone]

## 2019-12-11 NOTE — ED Provider Notes (Signed)
[facility] DEPT Provider Note   CSN: [id number] Arrival date & time: 12/11/19  1300     History Chief Complaint  Patient presents with  . COVID+  . Near Syncope    Max [name] is a 68 y.o. male history of PAF, hypertension, RA, seizures, meningioma, benign kidney tumor, dyslipidemia, HSV.  Patient presents today on his sixth day of illness, diagnosed with Covid 3 days ago.  Reports he has had shortness of breath, subjective fever and nonproductive cough since 12/05/2019.  He reports feeling warm at home but has not measured a temperature.  Describes a mild nonproductive cough without chest pain.  Reports shortness of breath only with exertion and walking around.  He reports that he was sitting on the couch today and attempted to get up when he felt lightheaded, sat back down and felt as if he was going to pass out.  Patient denies loss of consciousness, shortness of breath or chest pain before during or after the event.  EMS was called and patient noted to have orthostatic hypotension, he was given 400 mL fluid bolus and brought to the ER.  Additionally patient noted has SPO2 of 93% on room air, he was placed on 3 L nasal cannula with improvement up to 99%.  Time of evaluation patient denies headache, vision changes, neck stiffness, chest pain, hemoptysis, history of blood clot, abdominal pain, nausea/vomiting, diarrhea, extremity swelling/color change, body aches, rash or any additional concerns. HPI     Past Medical History:  Diagnosis Date  . Abdominal hernia 11/28/2015  . Allergic rhinitis   . Anxiety   . BPV (benign positional vertigo), right 05/11/2018  . Cardiac arrhythmia   . ED (erectile dysfunction)   . Herpes   . History of syncope    in the setting of anxiety per cardiology note  . Hypertension   . Kidney tumor (benign), right    sx 02/27/2015  . Meningioma ([place])    had surgery   . Paroxysmal atrial fibrillation (HCC) 07/15/2012     a) CHADSVASc score 1b) On full-dose ASA and Verapamil  . RA (rheumatoid arthritis) ([place])   . 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 Vasovagal syncope     Patient Active Problem List   Diagnosis Date Noted  . Dyslipidemia 06/21/2019  . Hyperglycemia 06/21/2019  . Scrotal mass 06/20/2019  . 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 Pulmonary nodule 01/28/2019  . History of cataract 01/28/2019  . Paroxysmal atrial fibrillation ([place]) 02/04/2014  . History of cold sores 07/14/2012  . Rheumatoid arthritis ([place]) 11/23/2009  . 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 Essential hypertension 08/26/2007    Past Surgical History:  Procedure Laterality Date  . BRAIN MENINGIOMA EXCISION  2012  . CATARACT EXTRACTION W/ INTRAOCULAR LENS IMPLANT  ~ 2001   right  . COLONOSCOPY  2008  . EYE SURGERY  1964   right; "hit w/baseball; eye hemorrhaged"  . FLEXIBLE SIGMOIDOSCOPY    . REFRACTIVE SURGERY  ~ 2003  . ROBOTIC ASSITED PARTIAL NEPHRECTOMY Right 02/29/2016   Procedure: XI ROBOTIC ASSITED PARTIAL NEPHRECTOMY;  Surgeon: Alexis Frock, MD;  Location: [facility];  Service: Urology;  Laterality: Right;   NEEDS ULTRASOUND       Family History  Problem Relation Age of Onset  . Depression Other   . Hypertension Other   . GI problems Other   . Lung disease Other   . Prostate cancer Brother   .  Prostate cancer Paternal Grandfather   . Colon cancer Neg Hx   . Esophageal cancer Neg Hx   . Ulcerative colitis Neg Hx     Social History   Tobacco Use  . Smoking status: Never Smoker  . Smokeless tobacco: Never Used  Substance Use Topics  . Alcohol use: No  . Drug use: No    Home Medications Prior to Admission medications   Medication Sig Start Date End Date Taking? Authorizing Provider  acyclovir (ZOVIRAX) 800 MG tablet Take 1 tablet (800 mg total) by mouth 3 (three) times daily as needed. For outbreaks 08/10/19   Vivi Barrack, MD  Ascorbic Acid (VITAMIN  C PO) Take 1 tablet by mouth daily.    [provider]  aspirin 81 MG chewable tablet Chew by mouth daily.    [provider]  folic acid (FOLVITE) 397 MCG tablet Take 400 mcg by mouth daily.    [provider]  methotrexate (RHEUMATREX) 2.5 MG tablet TAKE 4 TABLETS BY MOUTH WEEKLY 06/20/19   Vivi Barrack, MD  Multiple Vitamin (MULTIVITAMIN) tablet Take 1 tablet by mouth daily.    [provider]  omeprazole (PRILOSEC) 20 MG capsule Take 1 capsule (20 mg total) by mouth daily. 01/28/19   Vivi Barrack, MD  sildenafil (REVATIO) 20 MG tablet Take 1 to 2 tabs 2 - 3 hours before sex 05/11/18   Dorena Cookey, MD  verapamil (CALAN-SR) 180 MG CR tablet Take 1 tablet (180 mg total) by mouth daily. 06/20/19   Vivi Barrack, MD    Allergies    Vicodin [hydrocodone-acetaminophen]  Review of Systems   Review of Systems Ten systems are reviewed and are negative for acute change except as noted in the HPI Physical Exam Updated Vital Signs SpO2 93%   Physical Exam Constitutional:      General: He is not in acute distress.    Appearance: Normal appearance. He is well-developed. He is not ill-appearing or diaphoretic.  HENT:     Head: Normocephalic and atraumatic.     Right Ear: External ear normal.     Left Ear: External ear normal.     Nose: Nose normal.  Eyes:     General: Vision grossly intact. Gaze aligned appropriately.     Pupils: Pupils are equal, round, and reactive to light.  Neck:     Trachea: Trachea and phonation normal. No tracheal deviation.  Cardiovascular:     Rate and Rhythm: Normal rate and regular rhythm.     Pulses: Normal pulses.     Heart sounds: Normal heart sounds.  Pulmonary:     Effort: Pulmonary effort is normal. No respiratory distress.     Breath sounds: Normal breath sounds.  Abdominal:     General: There is no distension.     Palpations: Abdomen is soft.     Tenderness: There is no abdominal tenderness. There is no  guarding or rebound.  Musculoskeletal:        General: No tenderness. Normal range of motion.     Cervical back: Normal range of motion.     Right lower leg: No edema.     Left lower leg: No edema.  Skin:    General: Skin is warm and dry.  Neurological:     Mental Status: He is alert.     GCS: GCS eye subscore is 4. GCS verbal subscore is 5. GCS motor subscore is 6.     Comments: Speech is clear  and goal oriented, follows commands Major Cranial nerves without deficit, no facial droop Moves extremities without ataxia, coordination intact  Psychiatric:        Behavior: Behavior normal.     ED Results / Procedures / Treatments   Labs (all labs ordered are listed, but only abnormal results are displayed) Labs Reviewed - No data to display  EKG EKG Interpretation  Date/Time:  Sunday December 11 2019 13:29:06 EST Ventricular Rate:  82 PR Interval:    QRS Duration: 95 QT Interval:  369 QTC Calculation: 431 R Axis:   94 Text Interpretation: Sinus rhythm Atrial premature complexes Baseline wander in lead(s) V2 No significant change since last tracing Confirmed by Knapp, Jon ([id number]) on 12/11/2019 1:34:07 PM   Radiology No results found.  Procedures Procedures (including critical care time)  Medications Ordered in ED Medications - No data to display  ED Course  I have reviewed the triage vital signs and the nursing notes.  Pertinent labs & imaging results that were available during my care of the patient were reviewed by me and considered in my medical decision making (see chart for details).  Clinical Course as of Dec 10 1748  Sun Dec 11, 2019  1554 Hospitalist   [BM]    Clinical Course User Index [BM] ,  A, PA-C   MDM Rules/Calculators/A&P                     68  year old male arrives on his sixth day of illness, Covid positive.  He reports shortness of breath with activity and increasing tiredness over the past several days.  Patient attempted to  stand up from couch today when became lightheaded, no chest pain or shortness of breath, noted to be orthostatic by EMS and was given fluid bolus.  SPO2 low 90s, examination.  He denies any active chest pain or shortness of breath.  Suspect patient with Covid related hypoxia and orthostatic hypotension today, will obtain Covid blood work, chest x-ray and EKG.  Low suspicion for pulmonary embolism at this time as patient is without tachycardia, chest pain or signs of DVT.  Anticipate admission for hypoxia and orthostatic hypotension related to COVID-19 viral infection. Case discussed with Dr. Billy Fischer who agrees with plan of care. - CBC without leukocytosis or anemia Lactic 1.7 Triglycerides within normal limits CMP nonacute LDH 214 Ferritin 404 CRP 3.1 EKG: Sinus rhythm Atrial premature complexes Baseline wander in lead(s) V2 No significant change since last tracing Confirmed by Dorie Rank [phone]) on 12/11/2019 1:34:07 PM  CXR:  IMPRESSION:  1. New faint infiltrates at both lung bases.  2. New atelectasis or effusion at the left lung base.  3. No other significant abnormalities.  - Patient started on azithromycin and Rocephin empirically, 10 mg Decadron given for respiratory symptoms likely related to COVID-19 viral infection.  Consult placed to hospitalist for admission. - Discussed case with hospitalist Dr. Horris Latino who is seeing patient for admission. - 5:53 PM: Patient reevaluation resting comfortably no acute distress states understanding of care plan and is agreeable for admission.  Denies any pain or other concerns at this time.  Max [name] was evaluated in Emergency Department on 12/11/2019 for the symptoms described in the history of present illness. He was evaluated in the context of the global COVID-19 pandemic, which necessitated consideration that the patient might be at risk for infection with the SARS-CoV-2 virus that causes COVID-19. Institutional protocols and  algorithms that pertain to the evaluation of patients at  risk for COVID-19 are in a state of rapid change based on information released by regulatory bodies including the CDC and federal and state organizations. These policies and algorithms were followed during the patient's care in the ED.   Note: Portions of this report may have been transcribed using voice recognition software. Every effort was made to ensure accuracy; however, inadvertent computerized transcription errors may still be present. Final Clinical Impression(s) / ED Diagnoses Final diagnoses:  None    Rx / DC Orders ED Discharge Orders    None       Gari Crown 12/11/19 1754    Gareth Morgan, MD 12/12/19 1343

## 2019-12-12 ENCOUNTER — Other Ambulatory Visit: Payer: Self-pay

## 2019-12-12 DIAGNOSIS — J1289 Other viral pneumonia: Secondary | ICD-10-CM

## 2019-12-12 DIAGNOSIS — U071 COVID-19: Principal | ICD-10-CM

## 2019-12-12 LAB — PROCALCITONIN: Procalcitonin: <0.1 ng/mL

## 2019-12-12 LAB — ABO/RH: ABO/RH(D): A POS

## 2019-12-12 LAB — HIV ANTIBODY (ROUTINE TESTING W REFLEX): HIV Screen 4th Generation wRfx: NONREACTIVE

## 2019-12-12 MED ORDER — DEXAMETHASONE 4 MG PO TABS
6.0000 mg | ORAL_TABLET | ORAL | Status: DC
  Administered 2019-12-12 – 2019-12-13 (×2): 6 mg via ORAL
  Filled 2019-12-12: qty 2
  Filled 2019-12-12 (×2): qty 1

## 2019-12-12 MED ORDER — ZINC SULFATE 220 (50 ZN) MG PO CAPS
220.0000 mg | ORAL_CAPSULE | Freq: Every day | ORAL | Status: DC
  Administered 2019-12-12 – 2019-12-13 (×2): 220 mg via ORAL
  Filled 2019-12-12 (×2): qty 1

## 2019-12-12 MED ORDER — ENOXAPARIN SODIUM 40 MG/0.4ML ~~LOC~~ SOLN
40.0000 mg | SUBCUTANEOUS | Status: DC
  Administered 2019-12-12 – 2019-12-13 (×2): 40 mg via SUBCUTANEOUS
  Filled 2019-12-12 (×2): qty 0.4

## 2019-12-12 MED ORDER — ENOXAPARIN SODIUM 40 MG/0.4ML ~~LOC~~ SOLN: 40.0000 mg | SUBCUTANEOUS | Status: DC

## 2019-12-12 MED ORDER — ONDANSETRON HCL 4 MG/2ML IJ SOLN: 4.0000 mg | Freq: Four times a day (QID) | INTRAMUSCULAR | Status: DC | PRN

## 2019-12-12 MED ORDER — ASCORBIC ACID 500 MG PO TABS
500.0000 mg | ORAL_TABLET | Freq: Every day | ORAL | Status: DC
  Administered 2019-12-12 – 2019-12-13 (×2): 500 mg via ORAL
  Filled 2019-12-12 (×2): qty 1

## 2019-12-12 MED ORDER — GUAIFENESIN-DM 100-10 MG/5ML PO SYRP: 10.0000 mL | ORAL_SOLUTION | ORAL | Status: DC | PRN

## 2019-12-12 MED ORDER — SENNOSIDES-DOCUSATE SODIUM 8.6-50 MG PO TABS: 1.0000 | ORAL_TABLET | Freq: Every evening | ORAL | Status: DC | PRN

## 2019-12-12 MED ORDER — IPRATROPIUM-ALBUTEROL 20-100 MCG/ACT IN AERS
1.0000 | INHALATION_SPRAY | Freq: Four times a day (QID) | RESPIRATORY_TRACT | Status: DC
  Administered 2019-12-12 – 2019-12-13 (×5): 1 via RESPIRATORY_TRACT
  Filled 2019-12-12 (×2): qty 4

## 2019-12-12 MED ORDER — ACETAMINOPHEN 325 MG PO TABS: 650.0000 mg | ORAL_TABLET | Freq: Four times a day (QID) | ORAL | Status: DC | PRN

## 2019-12-12 MED ORDER — ONDANSETRON HCL 4 MG PO TABS: 4.0000 mg | ORAL_TABLET | Freq: Four times a day (QID) | ORAL | Status: DC | PRN

## 2019-12-12 MED ORDER — HYDRALAZINE HCL 25 MG PO TABS: 25.0000 mg | ORAL_TABLET | Freq: Three times a day (TID) | ORAL | Status: DC | PRN

## 2019-12-12 NOTE — Progress Notes (Signed)
PROGRESS NOTE    Max [name]  JQB:[id number] DOB: [birth year] DOA: 12/11/2019 PCP: Vivi Barrack, MD    Brief Narrative:   Max [name] is a 68 y.o. male with medical history significant for paroxysmal A. 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  Due to his symptoms, patient got tested at [facility], which came back positive on 12/07/2019.  Patient have been using Tylenol, cough suppressants for his symptoms.  Patient continues to have worsening symptoms, most notable for significant shortness of breath, generalized weakness and an episode of possible syncope for a few seconds as described by his spouse.  Of note, spouse was diagnosed with COVID-19 back in November, and at that time patient tested negative.  Due to worsening symptoms, with near syncope, EMS was called, patient was noted to be orthostatic and saturating around 93% on room air.  Patient denies any chest pain, abdominal pain, nausea/vomiting, diarrhea.    ED Course: In the ED, temp noted to be about 100.4, saturating 93% on room air.  Lab showed ferritin 404, CRP 3.1, procalcitonin negative, lactic acid within normal limits, no leukocytosis.  Chest x-ray showed new faint infiltrate at both lung bases, with atelectasis.  Patient started on Decadron, and remdesivir.  Patient admitted for further management   Assessment & Plan:   Principal Problem:   Pneumonia due to COVID-19 virus Active Problems:   Essential hypertension   Rheumatoid arthritis ([place])   Paroxysmal atrial fibrillation ([facility])   Acute hypoxic respiratory failure secondary to acute Covid-19 viral pneumonia during the ongoing 2020 Covid 19 Pandemic - POA Patient presenting to ED with complaints of weakness, nonproductive cough,  shortness of breath.  Recent Covid-19 test at [facility] positive on 12/07/2019.  Patient with history of close contact, spouse with Covid-19 in November 2020.  Chest x-ray with faint infiltrates bilateral lung bases with atelectasis versus effusion left base.  Fever 100.4 on presentation with HR 106 and RR 29.  D-dimer 1.14, ferritin 404, LDH 214 and CRP 3.1. --Continue remdesivir, plan 5-day course; Day #2/5 --Continue Decadron 6 mg p.o. daily, plan 10 day course --Continue supplemental oxygen, titrate to maintain SPO2 greater than 92%, currently on 2L [place] --Continue supportive care with vitamin C, zinc, Tylenol --Follow CBC, CMP, D-dimer, ferritin, and CRP daily --Continue airborne/contact isolation precautions  Orthostatic hypotension On EMS arrival, patient was noted to be orthostatic.  Received IV fluid resuscitation by EMS and in the ED.  Seen by physical therapy, no acute needs inpatient or on discharge. --Follow orthostatic vital signs daily  Essential hypertension --Continue verapamil 180 mg p.o. daily --Hydralazine 25 mg p.o. every 8 hours as needed for SBP > 170 or DBP > 110  Rheumatoid arthritis --On methotrexate 10 mg weekly, q. Sundays   DVT prophylaxis: Lovenox Code Status: Full code Family Communication: Present at bedside Disposition Plan: Remain inpatient, continue remdesivir and Decadron, attempt weaning O2, further depending on clinical course, likely discharge home when medically ready   Consultants:   None  Procedures:   None  Antimicrobials:   Remdesivir 12/27>>   Subjective: Patient seen and examined at bedside resting in ED waiting area.  States overall feels improved with IV fluids and currently on remdesivir and Decadron.  Although continues with fairly significant shortness of breath still requiring supplemental oxygen, now  at 2 L per nasal cannula.  No other complaints or concerns at this time.  Denies headache, no visual changes, no chest pain,  no palpitations, no abdominal pain, no fever/chills/night sweats, no nausea vomiting/diarrhea, no paresthesias.  No acute events overnight per nursing staff.  Objective: Vitals:   12/12/19 0730 12/12/19 0900 12/12/19 1115 12/12/19 1200  BP: (!) 145/60 (!) 157/93 (!) 130/57 (!) 177/68  Pulse: 77 (!) 53 71 (!) 37  Resp: 17 17 17  (!) 29  Temp:      TempSrc:      SpO2: 94% 96% 96% 96%  Weight:      Height:        Intake/Output Summary (Last 24 hours) at 12/12/2019 1359 Last data filed at 12/11/2019 1733 Gross per 24 hour  Intake 500 ml  Output --  Net 500 ml   Filed Weights   12/12/19 0659  Weight: 88.5 kg    Examination:  General exam: Appears calm and comfortable  Respiratory system: Clear to auscultation. Respiratory effort normal.  On 2 L nasal cannula oxygenating 96%.  No accessory muscle use. Cardiovascular system: S1 & S2 heard, RRR. No JVD, murmurs, rubs, gallops or clicks. No pedal edema. Gastrointestinal system: Abdomen is nondistended, soft and nontender. No organomegaly or masses felt. Normal bowel sounds heard. Central nervous system: Alert and oriented. No focal neurological deficits. Extremities: Symmetric 5 x 5 power. Skin: No rashes, lesions or ulcers Psychiatry: Judgement and insight appear normal. Mood & affect appropriate.     Data Reviewed: I have personally reviewed following labs and imaging studies  CBC: Recent Labs  Lab 12/11/19 1352  WBC 4.9  NEUTROABS 3.8  HGB 14.9  HCT 44.6  MCV 100.5*  PLT 035   Basic Metabolic Panel: Recent Labs  Lab 12/11/19 1352  NA 137  K 4.0  CL 100  CO2 25  GLUCOSE 110*  BUN 18  CREATININE 0.93  CALCIUM 8.4*   GFR: Estimated Creatinine Clearance: 79.8 mL/min (by C-G formula based on SCr of 0.93 mg/dL). Liver Function Tests: Recent Labs  Lab 12/11/19 1352  AST 40  ALT 44  ALKPHOS 87  BILITOT 0.5  PROT 7.2  ALBUMIN 3.5   No results for input(s): LIPASE, AMYLASE in the last 168 hours. No  results for input(s): AMMONIA in the last 168 hours. Coagulation Profile: No results for input(s): INR, PROTIME in the last 168 hours. Cardiac Enzymes: No results for input(s): CKTOTAL, CKMB, CKMBINDEX, TROPONINI in the last 168 hours. BNP (last 3 results) No results for input(s): PROBNP in the last 8760 hours. HbA1C: No results for input(s): HGBA1C in the last 72 hours. CBG: No results for input(s): GLUCAP in the last 168 hours. Lipid Profile: Recent Labs    12/11/19 1352  TRIG 114   Thyroid Function Tests: No results for input(s): TSH, T4TOTAL, FREET4, T3FREE, THYROIDAB in the last 72 hours. Anemia Panel: Recent Labs    12/11/19 1352  FERRITIN 404*   Sepsis Labs: Recent Labs  Lab 12/11/19 1352 12/11/19 1521 12/12/19 0414  PROCALCITON <0.10  --  <0.10  LATICACIDVEN 1.7 1.2  --     Recent Results (from the past 240 hour(s))  Novel Coronavirus, NAA ([facility])     Status: Abnormal   Collection Time: 12/07/19 10:16 AM   Specimen: Nasopharyngeal(NP) swabs in vial transport medium   NASOPHARYNGE  TESTING  Result Value Ref Range Status   SARS-CoV-2, NAA Detected (A) Not Detected Final    Comment: This nucleic acid  amplification test was developed and its performance characteristics determined by [employer]. Nucleic acid amplification tests include PCR and TMA. This test has not been FDA cleared or approved. This test has been authorized by FDA under an Emergency Use Authorization (EUA). This test is only authorized for the duration of time the declaration that circumstances exist justifying the authorization of the emergency use of in vitro diagnostic tests for detection of SARS-CoV-2 virus and/or diagnosis of COVID-19 infection under section 564(b)(1) of the Act, 21 U.S.C. 051TMY-1(R) (1), unless the authorization is terminated or revoked sooner. When diagnostic testing is negative, the possibility of a false negative result should be considered in the  context of a patient's recent exposures and the presence of clinical signs and symptoms consistent with COVID-19. An individual without symptoms of COVID-19 and who is not shedding SARS-CoV-2 virus would  expect to have a negative (not detected) result in this assay.          Radiology Studies: DG Chest Port 1 View  Result Date: 12/11/2019 CLINICAL DATA:  Shortness of breath. COVID-19. EXAM: PORTABLE CHEST 1 VIEW COMPARISON:  Chest x-ray dated 01/17/2016 and chest CT dated 08/20/2018 FINDINGS: There are new faint infiltrates at both lung bases. Heart size and pulmonary vascularity are normal. Atelectasis or effusion at the left lung base is well. No bone abnormality. IMPRESSION: 1. New faint infiltrates at both lung bases. 2. New atelectasis or effusion at the left lung base. 3. No other significant abnormalities. Electronically Signed   By: Lorriane Shire M.D.   On: 12/11/2019 14:00        Scheduled Meds: . vitamin C  500 mg Oral Daily  . aspirin  81 mg Oral Daily  . dexamethasone  6 mg Oral Q24H  . enoxaparin (LOVENOX) injection  40 mg Subcutaneous Q24H  . folic acid  173 mcg Oral Daily  . Ipratropium-Albuterol  1 puff Inhalation Q6H  . multivitamin with minerals  1 tablet Oral Daily  . pantoprazole  40 mg Oral Daily  . verapamil  180 mg Oral Daily  . zinc sulfate  220 mg Oral Daily   Continuous Infusions: . remdesivir 100 mg in NS 100 mL 100 mg (12/12/19 0959)     LOS: 1 day    Time spent: 36 minutes spent on chart review, discussion with nursing staff, consultants, updating family and interview/physical exam; more than 50% of that time was spent in counseling and/or coordination of care.    Max [name] [place] DO [facility] 12/12/2019, 1:59 PM

## 2019-12-12 NOTE — Plan of Care (Signed)
Call bell and belongings placed within reach. Patient will call for assistance if needing to get out of bed. Bed alarm set

## 2019-12-12 NOTE — ED Notes (Signed)
ED TO INPATIENT HANDOFF REPORT  ED Nurse Name and Phone #: Jeoffrey Massed Name/Age/Gender Max [name] Score 68 y.o. male Room/Bed: WA01/WA01  Code Status   Code Status: Full Code  Home/SNF/Other Home Patient oriented to: self, place, time and situation Is this baseline? Yes   Triage Complete: Triage complete  Chief Complaint Pneumonia due to COVID-19 virus [U07.1, J12.89]  Triage Note Pt BIBA from home.   Pt had COVID symptoms since 12/21. Pt was COVID+ 12/24 . Pt states today he had a near syncopal episode today after walking around at home.   Pt had + orthostatic changes.  134/73 HR 69 sitting 97/50 HR 87 standing  93% RA 99% 3L FSBG 149 98.6  20 L hand 400 cc NS    Allergies Allergies  Allergen Reactions  . Vicodin [Hydrocodone-Acetaminophen] Nausea And Vomiting    Level of Care/Admitting Diagnosis ED Disposition    ED Disposition Condition Comment   Admit  Hospital Area: [place] [[id number]]  Level of Care: Telemetry [5]  Admit to tele based on following criteria: Eval of Syncope  Covid Evaluation: Confirmed COVID Positive  Diagnosis: Pneumonia due to COVID-19 virus [[id number]]  Admitting Physician: Alma Friendly [[id number]]  Attending Physician: Alma Friendly [[id number]]  Estimated length of stay: past midnight tomorrow  Certification:: I certify this patient will need inpatient services for at least 2 midnights       B Medical/Surgery History Past Medical History:  Diagnosis Date  . Abdominal hernia 11/28/2015  . Allergic rhinitis   . Anxiety   . BPV (benign positional vertigo), right 05/11/2018  . Cardiac arrhythmia   . ED (erectile dysfunction)   . Herpes   . History of syncope    in the setting of anxiety per cardiology note  . Hypertension   . Kidney tumor (benign), right    sx 02/27/2015  . Meningioma ([place])    had surgery   . Paroxysmal atrial fibrillation ([facility]) 07/15/2012   a) CHADSVASc score 1b) On  full-dose ASA and Verapamil  . RA (rheumatoid arthritis) ([place])   . Seizures ([place]) 05/14/2011   "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   . Vasovagal syncope    Past Surgical History:  Procedure Laterality Date  . BRAIN MENINGIOMA EXCISION  2012  . CATARACT EXTRACTION W/ INTRAOCULAR LENS IMPLANT  ~ 2001   right  . COLONOSCOPY  2008  . EYE SURGERY  1964   right; "hit w/baseball; eye hemorrhaged"  . FLEXIBLE SIGMOIDOSCOPY    . REFRACTIVE SURGERY  ~ 2003  . ROBOTIC ASSITED PARTIAL NEPHRECTOMY Right 02/29/2016   Procedure: XI ROBOTIC ASSITED PARTIAL NEPHRECTOMY;  Surgeon: Alexis Frock, MD;  Location: [facility];  Service: Urology;  Laterality: Right;   NEEDS ULTRASOUND     A IV Location/Drains/Wounds Patient Lines/Drains/Airways Status   Active Line/Drains/Airways    Name:   Placement date:   Placement time:   Site:   Days:   Peripheral IV 02/04/14 Left Hand   02/04/14    0559    Hand   2137   Peripheral IV 12/11/19 Posterior;Right Hand   12/11/19    --    Hand   1   Peripheral IV 12/11/19 Right Antecubital   12/11/19    1349    Antecubital   1   Incision (Closed) 02/29/16 N/A Other (Comment)   02/29/16    0925     1382   Incision - 1 Port Abdomen 1: Right;Lateral;Lower;Distal  02/29/16    --     1382   Incision - 6 Ports Abdomen 1: Umbilicus;Upper 2: Upper;Mid;Umbilicus 3: Umbilicus;Upper;Superior 4: Right;Lateral;Upper 5: Right;Lateral;Mid 6: Right;Lateral;Lower   02/29/16    --     1382          Intake/Output Last 24 hours  Intake/Output Summary (Last 24 hours) at 12/12/2019 1709 Last data filed at 12/11/2019 1733 Gross per 24 hour  Intake 500 ml  Output --  Net 500 ml    Labs/Imaging Results for orders placed or performed during the hospital encounter of 12/11/19 (from the past 48 hour(s))  Lactic acid, plasma     Status: None   Collection Time: 12/11/19  1:52 PM  Result Value Ref Range   Lactic Acid, Venous 1.7 0.5 - 1.9 mmol/L    Comment: Performed at [facility], [place] [address], [place], [place] [address]  Blood Culture (routine x 2)     Status: None (Preliminary result)   Collection Time: 12/11/19  1:52 PM   Specimen: BLOOD  Result Value Ref Range   Specimen Description      BLOOD RIGHT ANTECUBITAL Performed at [facility], [place] [address], [place], [place] [address]    Special Requests      BOTTLES DRAWN AEROBIC AND ANAEROBIC Blood Culture adequate volume Performed at [place] [address] [place], [place] [address]    Culture      NO GROWTH < 24 HOURS Performed at [place] [address], [place], [place] [address]    Report Status PENDING   Blood Culture (routine x 2)     Status: None (Preliminary result)   Collection Time: 12/11/19  1:52 PM   Specimen: BLOOD LEFT HAND  Result Value Ref Range   Specimen Description      BLOOD LEFT HAND Performed at [place] [address], [place], [place] [address]    Special Requests      BOTTLES DRAWN AEROBIC AND ANAEROBIC Blood Culture adequate volume Performed at [place] [address], [place], [place] [address]    Culture      NO GROWTH < 24 HOURS Performed at [place] [address] [place],  [address]    Report Status PENDING   CBC WITH DIFFERENTIAL     Status: Abnormal   Collection Time: 12/11/19  1:52 PM  Result Value Ref Range   WBC 4.9 4.0 - 10.5 K/uL   RBC 4.44 4.22 - 5.81 MIL/uL   Hemoglobin 14.9 13.0 - 17.0 g/dL   HCT 44.6 39.0 - 52.0 %   MCV 100.5 (H) 80.0 - 100.0 fL   MCH 33.6 26.0 - 34.0 pg   MCHC 33.4 30.0 - 36.0 g/dL   RDW 13.2 11.5 - 15.5 %   Platelets 277 150 - 400 K/uL   nRBC 0.0 0.0 - 0.2 %   Neutrophils Relative % 79 %   Neutro Abs 3.8 1.7 - 7.7 K/uL   Lymphocytes Relative 12 %   Lymphs Abs 0.6 (L) 0.7 - 4.0 K/uL   Monocytes Relative 8 %   Monocytes Absolute 0.4 0.1 - 1.0 K/uL   Eosinophils Relative 0 %    Eosinophils Absolute 0.0 0.0 - 0.5 K/uL   Basophils Relative 0 %   Basophils Absolute 0.0 0.0 - 0.1 K/uL   WBC Morphology MORPHOLOGY UNREMARKABLE    Immature Granulocytes 1 %   Abs  Immature Granulocytes 0.06 0.00 - 0.07 K/uL    Comment: Performed at [facility], [place] [address], [place], [place] [address]  Comprehensive metabolic panel     Status: Abnormal   Collection Time: 12/11/19  1:52 PM  Result Value Ref Range   Sodium 137 135 - 145 mmol/L   Potassium 4.0 3.5 - 5.1 mmol/L   Chloride 100 98 - 111 mmol/L   CO2 25 22 - 32 mmol/L   Glucose, Bld 110 (H) 70 - 99 mg/dL   BUN 18 8 - 23 mg/dL   Creatinine, Ser 0.93 0.61 - 1.24 mg/dL   Calcium 8.4 (L) 8.9 - 10.3 mg/dL   Total Protein 7.2 6.5 - 8.1 g/dL   Albumin 3.5 3.5 - 5.0 g/dL   AST 40 15 - 41 U/L   ALT 44 0 - 44 U/L   Alkaline Phosphatase 87 38 - 126 U/L   Total Bilirubin 0.5 0.3 - 1.2 mg/dL   GFR calc non Af Amer >60 >60 mL/min   GFR calc Af Amer >60 >60 mL/min   Anion gap 12 5 - 15    Comment: Performed at [facility], [place] [address], [place], [place] [address]  D-dimer, quantitative     Status: Abnormal   Collection Time: 12/11/19  1:52 PM  Result Value Ref Range   D-Dimer, Quant 1.14 (H) 0.00 - 0.50 ug/mL-FEU    Comment: (NOTE) At the manufacturer cut-off of 0.50 ug/mL FEU, this assay has been documented to exclude PE with a sensitivity and negative predictive value of 97 to 99%.  At this time, this assay has not been approved by the FDA to exclude DVT/VTE. Results should be correlated with clinical presentation. Performed at [facility], [place] [address] [place], [place] [address]   Procalcitonin     Status: None   Collection Time: 12/11/19  1:52 PM  Result Value Ref Range   Procalcitonin <0.10 ng/mL    Comment:        Interpretation: PCT (Procalcitonin) <= 0.5 ng/mL: Systemic infection (sepsis) is not likely. Local bacterial infection is  possible. (NOTE)       Sepsis PCT Algorithm           Lower Respiratory Tract                                      Infection PCT Algorithm    ----------------------------     ----------------------------         PCT < 0.25 ng/mL                PCT < 0.10 ng/mL         Strongly encourage             Strongly discourage   discontinuation of antibiotics    initiation of antibiotics    ----------------------------     -----------------------------       PCT 0.25 - 0.50 ng/mL            PCT 0.10 - 0.25 ng/mL               OR       >80% decrease in PCT            Discourage initiation of  antibiotics      Encourage discontinuation           of antibiotics    ----------------------------     -----------------------------         PCT >= 0.50 ng/mL              PCT 0.26 - 0.50 ng/mL               AND        <80% decrease in PCT             Encourage initiation of                                             antibiotics       Encourage continuation           of antibiotics    ----------------------------     -----------------------------        PCT >= 0.50 ng/mL                  PCT > 0.50 ng/mL               AND         increase in PCT                  Strongly encourage                                      initiation of antibiotics    Strongly encourage escalation           of antibiotics                                     -----------------------------                                           PCT <= 0.25 ng/mL                                                 OR                                        > 80% decrease in PCT                                     Discontinue / Do not initiate                                             antibiotics Performed at [place] [address], [place], [place] [address]   Lactate dehydrogenase     Status: Abnormal   Collection Time: 12/11/19  1:52 PM  Result Value Ref Range   LDH 214  (H) 98 - 192 U/L    Comment: Performed at [facility], [place] [address] [place], [place] [address]  Ferritin     Status: Abnormal   Collection Time: 12/11/19  1:52 PM  Result Value Ref Range   Ferritin 404 (H) 24 - 336 ng/mL    Comment: Performed at [facility], [place] [address], [place], [place] [address]  Triglycerides     Status: None   Collection Time: 12/11/19  1:52 PM  Result Value Ref Range   Triglycerides 114 <150 mg/dL    Comment: Performed at [facility], [place] [address], [place], [place] [address]  Fibrinogen     Status: Abnormal   Collection Time: 12/11/19  1:52 PM  Result Value Ref Range   Fibrinogen 699 (H) 210 - 475 mg/dL    Comment: Performed at [facility], [place] [address] [place], [place] [address]  C-reactive protein     Status: Abnormal   Collection Time: 12/11/19  1:52 PM  Result Value Ref Range   CRP 3.1 (H) <1.0 mg/dL    Comment: Performed at [facility], [place] [address] [place], [place] [address]  Lactic acid, plasma     Status: None   Collection Time: 12/11/19  3:21 PM  Result Value Ref Range   Lactic Acid, Venous 1.2 0.5 - 1.9 mmol/L    Comment: Performed at [facility], [place] [address] [place], [place] [address]  HIV Antibody (routine testing w rflx)     Status: None   Collection Time: 12/12/19  3:06 AM  Result Value Ref Range   HIV Screen 4th Generation wRfx NON REACTIVE NON REACTIVE    Comment: Performed at [place] [address] [place], [place] [address]  Procalcitonin     Status: None   Collection Time: 12/12/19  4:14 AM  Result Value Ref Range   Procalcitonin <0.10 ng/mL    Comment:        Interpretation: PCT (Procalcitonin) <= 0.5 ng/mL: Systemic infection (sepsis) is not likely. Local bacterial infection is possible. (NOTE)       Sepsis PCT Algorithm           Lower Respiratory Tract                                       Infection PCT Algorithm    ----------------------------     ----------------------------         PCT < 0.25 ng/mL                PCT < 0.10 ng/mL         Strongly encourage             Strongly discourage   discontinuation of antibiotics    initiation of antibiotics    ----------------------------     -----------------------------       PCT 0.25 - 0.50 ng/mL            PCT 0.10 - 0.25 ng/mL               OR       >80% decrease in PCT            Discourage initiation of  antibiotics      Encourage discontinuation           of antibiotics    ----------------------------     -----------------------------         PCT >= 0.50 ng/mL              PCT 0.26 - 0.50 ng/mL               AND        <80% decrease in PCT             Encourage initiation of                                             antibiotics       Encourage continuation           of antibiotics    ----------------------------     -----------------------------        PCT >= 0.50 ng/mL                  PCT > 0.50 ng/mL               AND         increase in PCT                  Strongly encourage                                      initiation of antibiotics    Strongly encourage escalation           of antibiotics                                     -----------------------------                                           PCT <= 0.25 ng/mL                                                 OR                                        > 80% decrease in PCT                                     Discontinue / Do not initiate                                             antibiotics Performed at [place] [address] [place],  [address]   ABO/Rh     Status: None   Collection Time: 12/12/19  4:21  AM  Result Value Ref Range   ABO/RH(D)      A POS Performed at [facility], [place] [address], [place], [place] [address]    DG Chest Port 1  View  Result Date: 12/11/2019 CLINICAL DATA:  Shortness of breath. COVID-19. EXAM: PORTABLE CHEST 1 VIEW COMPARISON:  Chest x-ray dated 01/17/2016 and chest CT dated 08/20/2018 FINDINGS: There are new faint infiltrates at both lung bases. Heart size and pulmonary vascularity are normal. Atelectasis or effusion at the left lung base is well. No bone abnormality. IMPRESSION: 1. New faint infiltrates at both lung bases. 2. New atelectasis or effusion at the left lung base. 3. No other significant abnormalities. Electronically Signed   By: Lorriane Shire M.D.   On: 12/11/2019 14:00    Pending Labs Unresulted Labs (From admission, onward)    Start     Ordered   12/13/19 0000  CBC with Differential/Platelet  Daily,   R     12/12/19 0305   12/13/19 0000  Comprehensive metabolic panel  Daily,   R     12/12/19 0305   12/13/19 0000  C-reactive protein  Daily,   R     12/12/19 0305   12/13/19 0000  D-dimer, quantitative (not at [facility])  Daily,   R     12/12/19 0305   12/13/19 0000  Ferritin  Daily,   R     12/12/19 0305   12/12/19 0500  Procalcitonin  Daily,   R     12/11/19 1602          Vitals/Pain Today's Vitals   12/12/19 1452 12/12/19 1500 12/12/19 1630 12/12/19 1644  BP: 139/88 130/70 124/76 124/76  Pulse: 68 66 92 (!) 105  Resp: 18   16  Temp:      TempSrc:      SpO2: 95% 95% 95% 95%  Weight:      Height:      PainSc:        Isolation Precautions Airborne and Contact precautions  Medications Medications  Ipratropium-Albuterol (COMBIVENT) respimat 1 puff (1 puff Inhalation Given 12/12/19 1451)  dexamethasone (DECADRON) tablet 6 mg (6 mg Oral Given 12/12/19 0408)  guaiFENesin-dextromethorphan (ROBITUSSIN DM) 100-10 MG/5ML syrup 10 mL (has no administration in time range)  ascorbic acid (VITAMIN C) tablet 500 mg (500 mg Oral Given 12/12/19 0952)  zinc sulfate capsule 220 mg (220 mg Oral Given 12/12/19 0951)  acetaminophen (TYLENOL) tablet 650 mg (has no administration in time  range)  senna-docusate (Senokot-S) tablet 1 tablet (has no administration in time range)  ondansetron (ZOFRAN) tablet 4 mg (has no administration in time range)    Or  ondansetron (ZOFRAN) injection 4 mg (has no administration in time range)  remdesivir 200 mg in sodium chloride 0.9% 250 mL IVPB (0 mg Intravenous Stopped 12/11/19 1733)    Followed by  remdesivir 100 mg in sodium chloride 0.9 % 100 mL IVPB (0 mg Intravenous Stopped 12/12/19 1452)  aspirin chewable tablet 81 mg (0 mg Oral Hold 54/62/70 3500)  folic acid (FOLVITE) tablet 0.5 mg (0.5 mg Oral Given 12/12/19 0953)  multivitamin with minerals tablet 1 tablet (1 tablet Oral Given 12/12/19 0952)  pantoprazole (PROTONIX) EC tablet 40 mg (40 mg Oral Given 12/12/19 0952)  verapamil (CALAN-SR) CR tablet 180 mg (180 mg Oral Given 12/12/19 0953)  enoxaparin (LOVENOX) injection 40 mg (40 mg Subcutaneous Given 12/12/19 0954)  hydrALAZINE (APRESOLINE) tablet 25 mg (has no administration in time range)  cefTRIAXone (  ROCEPHIN) 1 g in sodium chloride 0.9 % 100 mL IVPB (0 g Intravenous Stopped 12/11/19 1511)  azithromycin (ZITHROMAX) 500 mg in sodium chloride 0.9 % 250 mL IVPB (0 mg Intravenous Stopped 12/11/19 1733)  dexamethasone (DECADRON) injection 10 mg (10 mg Intravenous Given 12/11/19 1433)  sodium chloride 0.9 % bolus 500 mL (0 mLs Intravenous Stopped 12/11/19 1555)    Mobility walks Low fall risk   Focused Assessments Cardiac Assessment Handoff:    Lab Results  Component Value Date   CKTOTAL 71 07/15/2012   CKMB 2.1 07/15/2012   TROPONINI <0.30 02/05/2014   Lab Results  Component Value Date   DDIMER 1.14 (H) 12/11/2019   Does the Patient currently have chest pain? No  , Pulmonary Assessment Handoff:  Lung sounds: Bilateral Breath Sounds: Diminished L Breath Sounds: Diminished R Breath Sounds: Diminished O2 Device: Nasal Cannula O2 Flow Rate (L/min): 1 L/min      R Recommendations: See Admitting Provider  Note  Report given to:   Additional Notes:

## 2019-12-12 NOTE — Evaluation (Signed)
Physical Therapy Evaluation Patient Details Name: Max [name] MRN: [id number] DOB: Feb 25, 1951 Today's Date: 12/12/2019   History of Present Illness  Max [name] is a 68 y.o. male with medical history significant for paroxysmal A. 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  Due to his symptoms, patient got tested at [facility], which came back positive on 12/07/2019.  Patient have been using Tylenol, cough suppressants for his symptoms.  Patient continues to have worsening symptoms, most notable for significant shortness of breath, generalized weakness and an episode of possible syncope for a few seconds as described by his spouse.  Of note, spouse was diagnosed with COVID-19 back in November, and at that time patient tested negative.  Due to worsening symptoms, with near syncope, EMS was called, patient was noted to be orthostatic and saturating around 93% on room air.  Patient denies any chest pain, abdominal pain, nausea/vomiting, diarrhea.    Clinical Impression  [name] is 68 y.o. male admitted with above HPI and diagnosis. Patient lives with his wife and is independent at baseline. Patient is currently functioning at Icard level for all mobility requiring supplemental O2.. No assist required for bed mob, transfers, and gait. Pt does have some balance impairments at baseline on Lt side related to a meningioma removed in 2012. He does not currently require skilled acute PT services, no follow up recommended. Please re-consult if patient functional status changes.     Follow Up Recommendations No PT follow up    Equipment Recommendations  None recommended by PT    Recommendations for Other Services       Precautions / Restrictions  Precautions Precautions: None Restrictions Weight Bearing Restrictions: No      Mobility  Bed Mobility Overal bed mobility: Modified Independent Bed Mobility: Supine to Sit;Sit to Supine     Supine to sit: Modified independent (Device/Increase time) Sit to supine: Modified independent (Device/Increase time)      Transfers Overall transfer level: Modified independent Equipment used: None             General transfer comment: no assist required and no guarding needed for safety, pt steady with transfer, denies dizziness or light headed sensation  Ambulation/Gait Ambulation/Gait assistance: Supervision Gait Distance (Feet): 5 Feet Assistive device: None Gait Pattern/deviations: WFL(Within Functional Limits) Gait velocity: WNLs   General Gait Details: pt restrcited with mobility due to lines and performed short forward/backward and side stepping for gait in ED room  Stairs       Wheelchair Mobility    Modified Rankin (Stroke Patients Only)       Balance Overall balance assessment: Modified Independent;Mild deficits observed, not formally tested   Sitting balance-Leahy Scale: Normal       Standing balance-Leahy Scale: Good Standing balance comment: pt reports history of brain tumor which has left him with difficulty balancing on Lt LE Single Leg Stance - Right Leg: 30 Single Leg Stance - Left Leg: 4 Tandem Stance - Right Leg: 30 Tandem Stance - Left Leg: 4            Pertinent Vitals/Pain Pain Assessment: No/denies pain    Home Living Family/patient expects to be discharged to:: Private residence Living Arrangements: Spouse/significant other Available Help at Discharge: Family(pt's wife also has COVID-19) Type of Home: House Home Access:  Stairs to enter Entrance Stairs-Rails: Psychiatric nurse of Steps: from garage 6 Home Layout: One level Home Equipment: None      Prior Function Level of Independence: Independent          Comments: works full time at 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     Hand Dominance   Dominant Hand: Right    Extremity/Trunk Assessment   Upper Extremity Assessment Upper Extremity Assessment: Overall WFL for tasks assessed    Lower Extremity Assessment Lower Extremity Assessment: Overall WFL for tasks assessed    Cervical / Trunk Assessment Cervical / Trunk Assessment: Normal  Communication   Communication: No difficulties  Cognition Arousal/Alertness: Awake/alert Behavior During Therapy: WFL for tasks assessed/performed Overall Cognitive Status: Within Functional Limits for tasks assessed             General Comments General comments (skin integrity, edema, etc.): 5x Sit<>Stand Test: pt performed without UE use, 11.8 seconds. some use of back of LE's against bed to initiate power up.    Exercises     Assessment/Plan    PT Assessment Patent does not need any further PT services  PT Problem List         PT Treatment Interventions      PT Goals (Current goals can be found in the Care Plan section)  Acute Rehab PT Goals Patient Stated Goal: to go home and recover from COVID PT Goal Formulation: With patient Time For Goal Achievement: 12/19/19 Potential to Achieve Goals: Good    Frequency (1x eval/treat)    AM-PAC PT "6 Clicks" Mobility  Outcome Measure Help needed turning from your back to your side while in a flat bed without using bedrails?: None Help needed moving from lying on your back to sitting on the side of a flat bed without using bedrails?: None Help needed moving to and from a bed to a chair (including a wheelchair)?: None Help needed standing up from a chair using your arms (e.g., wheelchair or bedside chair)?: None Help needed to walk in hospital room?: A Little Help needed climbing 3-5 steps with a railing? : A Little 6 Click Score: 22    End of Session Equipment Utilized During Treatment: Gait  belt Activity Tolerance: Patient tolerated treatment well Patient left: in bed;with call bell/phone within reach Nurse Communication: Mobility status PT Visit Diagnosis: Difficulty in walking, not elsewhere classified (R26.2)    Time: 2426-8341 PT Time Calculation (min) (ACUTE ONLY): 15 min   Charges:   PT Evaluation $PT Eval Low Complexity: 1 Low          Kipp Brood, PT, DPT Physical Therapist with [facility]  12/12/2019 12:25 PM

## 2019-12-12 NOTE — ED Notes (Signed)
Pt decreased to 1L of O2 via [place] at this time. Will continue to monitor.

## 2019-12-13 LAB — FERRITIN: Ferritin: 408 ng/mL — ABNORMAL HIGH (ref 24–336)

## 2019-12-13 LAB — CBC WITH DIFFERENTIAL/PLATELET
Abs Immature Granulocytes: 0.11 10*3/uL — ABNORMAL HIGH (ref 0.00–0.07)
Basophils Absolute: 0.1 10*3/uL (ref 0.0–0.1)
Basophils Relative: 1 %
Eosinophils Absolute: 0 10*3/uL (ref 0.0–0.5)
Eosinophils Relative: 0 %
HCT: 41.9 % (ref 39.0–52.0)
Hemoglobin: 13.8 g/dL (ref 13.0–17.0)
Immature Granulocytes: 2 %
Lymphocytes Relative: 19 %
Lymphs Abs: 1.3 10*3/uL (ref 0.7–4.0)
MCH: 32.9 pg (ref 26.0–34.0)
MCHC: 32.9 g/dL (ref 30.0–36.0)
MCV: 100 fL (ref 80.0–100.0)
Monocytes Absolute: 0.8 10*3/uL (ref 0.1–1.0)
Monocytes Relative: 11 %
Neutro Abs: 4.7 10*3/uL (ref 1.7–7.7)
Neutrophils Relative %: 67 %
Platelets: 302 10*3/uL (ref 150–400)
RBC: 4.19 MIL/uL — ABNORMAL LOW (ref 4.22–5.81)
RDW: 13.2 % (ref 11.5–15.5)
WBC: 6.9 10*3/uL (ref 4.0–10.5)
nRBC: 0 % (ref 0.0–0.2)

## 2019-12-13 LAB — COMPREHENSIVE METABOLIC PANEL
ALT: 52 U/L — ABNORMAL HIGH (ref 0–44)
AST: 39 U/L (ref 15–41)
Albumin: 2.9 g/dL — ABNORMAL LOW (ref 3.5–5.0)
Alkaline Phosphatase: 65 U/L (ref 38–126)
Anion gap: 9 (ref 5–15)
BUN: 28 mg/dL — ABNORMAL HIGH (ref 8–23)
CO2: 25 mmol/L (ref 22–32)
Calcium: 8.5 mg/dL — ABNORMAL LOW (ref 8.9–10.3)
Chloride: 106 mmol/L (ref 98–111)
Creatinine, Ser: 0.74 mg/dL (ref 0.61–1.24)
GFR calc Af Amer: >60 mL/min (ref >60)
GFR calc non Af Amer: >60 mL/min (ref >60)
Glucose, Bld: 129 mg/dL — ABNORMAL HIGH (ref 70–99)
Potassium: 4.2 mmol/L (ref 3.5–5.1)
Sodium: 140 mmol/L (ref 135–145)
Total Bilirubin: 0.6 mg/dL (ref 0.3–1.2)
Total Protein: 6.1 g/dL — ABNORMAL LOW (ref 6.5–8.1)

## 2019-12-13 LAB — D-DIMER, QUANTITATIVE: D-Dimer, Quant: 0.78 ug/mL-FEU — ABNORMAL HIGH (ref 0.00–0.50)

## 2019-12-13 LAB — C-REACTIVE PROTEIN: CRP: 2.2 mg/dL — ABNORMAL HIGH (ref <1.0)

## 2019-12-13 LAB — PROCALCITONIN: Procalcitonin: <0.1 ng/mL

## 2019-12-13 MED ORDER — DEXAMETHASONE 6 MG PO TABS: 6.0000 mg | ORAL_TABLET | ORAL | 0 refills | Status: AC

## 2019-12-13 MED ORDER — FOLIC ACID 1 MG PO TABS
1.0000 mg | ORAL_TABLET | Freq: Every day | ORAL | Status: DC
  Administered 2019-12-13: 1 mg via ORAL
  Filled 2019-12-13: qty 1

## 2019-12-13 MED ORDER — IPRATROPIUM-ALBUTEROL 20-100 MCG/ACT IN AERS: 1.0000 | INHALATION_SPRAY | Freq: Four times a day (QID) | RESPIRATORY_TRACT | 0 refills | Status: DC

## 2019-12-13 MED ORDER — ZINC SULFATE 220 (50 ZN) MG PO CAPS: 220.0000 mg | ORAL_CAPSULE | Freq: Every day | ORAL | 0 refills | Status: AC

## 2019-12-13 NOTE — Progress Notes (Signed)
Patient scheduled for outpatient Remdesivir infusion at 230PM on Wednesday 12/30 and Thursday 12/31.  Please advise them to report to [facility] at [address]  Drive to the security guard and tell them you are here for an infusion. They will direct you to the front entrance where we will come and get you.  For questions call [phone].  Thanks

## 2019-12-13 NOTE — Discharge Instructions (Signed)
You are scheduled for an outpatient infusion of Remdesivir at 230PM on Wednesday 12/30 and Thursday 12/31.  Please report to [facility] at [address].  Drive to the security guard and tell them you are here for an infusion. They will direct you to the front entrance where we will come and get you.  For questions call [phone].  Thanks       Person Under Monitoring Name: Max [name]  Location: [address] [place] [address]   Infection Prevention Recommendations for Individuals Confirmed to have, or Being Evaluated for, 2019 Novel Coronavirus (COVID-19) Infection Who Receive Care at Home  Individuals who are confirmed to have, or are being evaluated for, COVID-19 should follow the prevention steps below until a healthcare provider or local or state health department says they can return to normal activities.  Stay home except to get medical care You should restrict activities outside your home, except for getting medical care. Do not go to work, school, or public areas, and do not use public transportation or taxis.  Call ahead before visiting your doctor Before your medical appointment, call the healthcare provider and tell them that you have, or are being evaluated for, COVID-19 infection. This will help the healthcare providers office take steps to keep other people from getting infected. Ask your healthcare provider to call the local or state health department.  Monitor your symptoms Seek prompt medical attention if your illness is worsening (e.g., difficulty breathing). Before going to your medical appointment, call the healthcare provider and tell them that you have, or are being evaluated for, COVID-19 infection. Ask your healthcare provider to call the local or state health department.  Wear a facemask You should wear a facemask that covers your nose and mouth when you are in the same room with other people and when you visit a  healthcare provider. People who live with or visit you should also wear a facemask while they are in the same room with you.  Separate yourself from other people in your home As much as possible, you should stay in a different room from other people in your home. Also, you should use a separate bathroom, if available.  Avoid sharing household items You should not share dishes, drinking glasses, cups, eating utensils, towels, bedding, or other items with other people in your home. After using these items, you should wash them thoroughly with soap and water.  Cover your coughs and sneezes Cover your mouth and nose with a tissue when you cough or sneeze, or you can cough or sneeze into your sleeve. Throw used tissues in a lined trash can, and immediately wash your hands with soap and water for at least 20 seconds or use an alcohol-based hand rub.  Wash your [facility] your hands often and thoroughly with soap and water for at least 20 seconds. You can use an alcohol-based hand sanitizer if soap and water are not available and if your hands are not visibly dirty. Avoid touching your eyes, nose, and mouth with unwashed hands.   Prevention Steps for Caregivers and Household Members of Individuals Confirmed to have, or Being Evaluated for, COVID-19 Infection Being Cared for in the Home  If you live with, or provide care at home for, a person confirmed to have, or being evaluated for, COVID-19 infection please follow these guidelines to prevent infection:  Follow healthcare providers instructions Make sure that you understand and can help the patient follow any healthcare provider  instructions for all care.  Provide for the patients basic needs You should help the patient with basic needs in the home and provide support for getting groceries, prescriptions, and other personal needs.  Monitor the patients symptoms If they are getting sicker, call his or her medical provider and tell  them that the patient has, or is being evaluated for, COVID-19 infection. This will help the healthcare providers office take steps to keep other people from getting infected. Ask the healthcare provider to call the local or state health department.  Limit the number of people who have contact with the patient  If possible, have only one caregiver for the patient.  Other household members should stay in another home or place of residence. If this is not possible, they should stay  in another room, or be separated from the patient as much as possible. Use a separate bathroom, if available.  Restrict visitors who do not have an essential need to be in the home.  Keep older adults, very young children, and other sick people away from the patient Keep older adults, very young children, and those who have compromised immune systems or chronic health conditions away from the patient. This includes people with chronic heart, lung, or kidney conditions, diabetes, and cancer.  Ensure good ventilation Make sure that shared spaces in the home have good air flow, such as from an air conditioner or an opened window, weather permitting.  Wash your hands often  Wash your hands often and thoroughly with soap and water for at least 20 seconds. You can use an alcohol based hand sanitizer if soap and water are not available and if your hands are not visibly dirty.  Avoid touching your eyes, nose, and mouth with unwashed hands.  Use disposable paper towels to dry your hands. If not available, use dedicated cloth towels and replace them when they become wet.  Wear a facemask and gloves  Wear a disposable facemask at all times in the room and gloves when you touch or have contact with the patients blood, body fluids, and/or secretions or excretions, such as sweat, saliva, sputum, nasal mucus, vomit, urine, or feces.  Ensure the mask fits over your nose and mouth tightly, and do not touch it during  use.  Throw out disposable facemasks and gloves after using them. Do not reuse.  Wash your hands immediately after removing your facemask and gloves.  If your personal clothing becomes contaminated, carefully remove clothing and launder. Wash your hands after handling contaminated clothing.  Place all used disposable facemasks, gloves, and other waste in a lined container before disposing them with other household waste.  Remove gloves and wash your hands immediately after handling these items.  Do not share dishes, glasses, or other household items with the patient  Avoid sharing household items. You should not share dishes, drinking glasses, cups, eating utensils, towels, bedding, or other items with a patient who is confirmed to have, or being evaluated for, COVID-19 infection.  After the person uses these items, you should wash them thoroughly with soap and water.  Wash laundry thoroughly  Immediately remove and wash clothes or bedding that have blood, body fluids, and/or secretions or excretions, such as sweat, saliva, sputum, nasal mucus, vomit, urine, or feces, on them.  Wear gloves when handling laundry from the patient.  Read and follow directions on labels of laundry or clothing items and detergent. In general, wash and dry with the warmest temperatures recommended on the label.  Clean all areas the individual has used often  Clean all touchable surfaces, such as counters, tabletops, doorknobs, bathroom fixtures, toilets, phones, keyboards, tablets, and bedside tables, every day. Also, clean any surfaces that may have blood, body fluids, and/or secretions or excretions on them.  Wear gloves when cleaning surfaces the patient has come in contact with.  Use a diluted bleach solution (e.g., dilute bleach with 1 part bleach and 10 parts water) or a household disinfectant with a label that says EPA-registered for coronaviruses. To make a bleach solution at home, add 1 tablespoon  of bleach to 1 quart (4 cups) of water. For a larger supply, add  cup of bleach to 1 gallon (16 cups) of water.  Read labels of cleaning products and follow recommendations provided on product labels. Labels contain instructions for safe and effective use of the cleaning product including precautions you should take when applying the product, such as wearing gloves or eye protection and making sure you have good ventilation during use of the product.  Remove gloves and wash hands immediately after cleaning.  Monitor yourself for signs and symptoms of illness Caregivers and household members are considered close contacts, should monitor their health, and will be asked to limit movement outside of the home to the extent possible. Follow the monitoring steps for close contacts listed on the symptom monitoring form.   ? If you have additional questions, contact your local health department or call the epidemiologist on call at [phone] (available 24/7). ? This guidance is subject to change. For the most up-to-date guidance from [facility], please refer to their website: YouBlogs.pl   Person Under Monitoring Name: Max [name]  Location: [place] [address]   CORONAVIRUS DISEASE 2019 (COVID-19) Guidance for Persons Under Investigation You are being tested for the virus that causes coronavirus disease 2019 (COVID-19). Public health actions are necessary to ensure protection of your health and the health of others, and to prevent further spread of infection. COVID-19 is caused by a virus that can cause symptoms, such as fever, cough, and shortness of breath. The primary transmission from person to person is by coughing or sneezing. On January 13, 2019, the [place] announced a [employer] Emergency of International Concern and on January 14, 2019 the U.S. Department of Health and Human  Services declared a public health emergency. If the virus that causesCOVID-19 spreads in the community, it could have severe public health consequences.  As a person under investigation for COVID-19, the [place] advises you to adhere to the following guidance until your test results are reported to you. If your test result is positive, you will receive additional information from your provider and your local health department at that time.   Remain at home until you are cleared by your health provider or public health authorities.   Keep a log of visitors to your home using the form provided. Any visitors to your home must be aware of your isolation status.  If you plan to move to a new address or leave the county, notify the local health department in your county.  Call a doctor or seek care if you have an urgent medical need. Before seeking medical care, call ahead and get instructions from the provider before arriving at the medical office, clinic or hospital. Notify them that you are being tested for the virus that causes COVID-19 so arrangements can be made, as  necessary, to prevent transmission to others in the healthcare setting. Next, notify the local health department in your county.  If a medical emergency arises and you need to call 911, inform the first responders that you are being tested for the virus that causes COVID-19. Next, notify the local health department in your county.  Adhere to all guidance set forth by the [facility] of patients that is based on guidance from the Center for Disease Control and Prevention with suspected or confirmed COVID-19. It is provided with this guidance for Persons Under Investigation.  Your health and the health of our community are our top priorities. Public Health officials remain available to provide assistance and counseling to  you about COVID-19 and compliance with this guidance.  Provider: ____________________________________________________________ Date: ______/_____/_________  By signing below, you acknowledge that you have read and agree to comply with this Guidance for Persons Under Investigation. ______________________________________________________________ Date: ______/_____/_________  WHO DO I CALL? You can find a list of local health departments here: [email or web address] Health Department: ____________________________________________________________________ Contact Name: ________________________________________________________________________ Telephone: ___________________________________________________________________________  [facility], [place], Communicable Disease Branch COVID-19 Guidance for Persons Under Investigation March 7, 2020COVID-19: How to Protect Yourself and Others Know how it spreads  There is currently no vaccine to prevent coronavirus disease 2019 (COVID-19).  The best way to prevent illness is to avoid being exposed to this virus.  The virus is thought to spread mainly from person-to-person. ? Between people who are in close contact with one another (within about 6 feet). ? Through respiratory droplets produced when an infected person coughs, sneezes or talks. ? These droplets can land in the mouths or noses of people who are nearby or possibly be inhaled into the lungs. ? Some recent studies have suggested that COVID-19 may be spread by people who are not showing symptoms. Everyone should Clean your hands often  Wash your hands often with soap and water for at least 20 seconds especially after you have been in a public place, or after blowing your nose, coughing, or sneezing.  If soap and water are not readily available, use a hand sanitizer that contains at least 60% alcohol. Cover all surfaces of  your hands and rub them together until they feel dry.  Avoid touching your eyes, nose, and mouth with unwashed hands. Avoid close contact  Stay home if you are sick.  Avoid close contact with people who are sick.  Put distance between yourself and other people. ? Remember that some people without symptoms may be able to spread virus. ? This is especially important for people who are at higher risk of getting very GainPain.com.cy Cover your mouth and nose with a cloth face cover when around others  You could spread COVID-19 to others even if you do not feel sick.  Everyone should wear a cloth face cover when they have to go out in public, for example to the grocery store or to pick up other necessities. ? Cloth face coverings should not be placed on young children under age 54, anyone who has trouble breathing, or is unconscious, incapacitated or otherwise unable to remove the mask without assistance.  The cloth face cover is meant to protect other people in case you are infected.  Do NOT use a facemask meant for a Dietitian.  Continue to keep about 6 feet between yourself and others. The cloth face cover is not a substitute for social distancing. Cover coughs  and sneezes  If you are in a private setting and do not have on your cloth face covering, remember to always cover your mouth and nose with a tissue when you cough or sneeze or use the inside of your elbow.  Throw used tissues in the trash.  Immediately wash your hands with soap and water for at least 20 seconds. If soap and water are not readily available, clean your hands with a hand sanitizer that contains at least 60% alcohol. Clean and disinfect  Clean AND disinfect frequently touched surfaces daily. This includes tables, doorknobs, light switches, countertops, handles, desks, phones, keyboards, toilets, faucets, and sinks.  RackRewards.fr  If surfaces are dirty, clean them: Use detergent or soap and water prior to disinfection.  Then, use a household disinfectant. You can see a list of EPA-registered household disinfectants here. michellinders.com 04/19/2019 This information is not intended to replace advice given to you by your health care provider. Make sure you discuss any questions you have with your health care provider. Document Released: 03/29/2019 Document Revised: 04/27/2019 Document Reviewed: 03/29/2019 Elsevier Patient Education  2020 [employer].  COVID-19 COVID-19 is a respiratory infection that is caused by a virus called severe acute respiratory syndrome coronavirus 2 (SARS-CoV-2). The disease is also known as coronavirus disease or novel coronavirus. In some people, the virus may not cause any symptoms. In others, it may cause a serious infection. The infection can get worse quickly and can lead to complications, such as:  Pneumonia, or infection of the lungs.  Acute respiratory distress syndrome or ARDS. This is fluid build-up in the lungs.  Acute respiratory failure. This is a condition in which there is not enough oxygen passing from the lungs to the body.  Sepsis or septic shock. This is a serious bodily reaction to an infection.  Blood clotting problems.  Secondary infections due to bacteria or fungus. The virus that causes COVID-19 is contagious. This means that it can spread from person to person through droplets from coughs and sneezes (respiratory secretions). What are the causes? This illness is caused by a virus. You may catch the virus by:  Breathing in droplets from an infected person's cough or sneeze.  Touching something, like a table or a doorknob, that was exposed to the virus (contaminated) and then touching your mouth, nose, or eyes. What increases the risk? Risk for infection You are more likely  to be infected with this virus if you:  Live in or travel to an area with a COVID-19 outbreak.  Come in contact with a sick person who recently traveled to an area with a COVID-19 outbreak.  Provide care for or live with a person who is infected with COVID-19. Risk for serious illness You are more likely to become seriously ill from the virus if you:  Are 75 years of age or older.  Have a long-term disease that lowers your body's ability to fight infection (immunocompromised).  Live in a nursing home or long-term care facility.  Have a long-term (chronic) disease such as: ? Chronic lung disease, including chronic obstructive pulmonary disease or asthma ? Heart disease. ? Diabetes. ? Chronic kidney disease. ? Liver disease.  Are obese. What are the signs or symptoms? Symptoms of this condition can range from mild to severe. Symptoms may appear any time from 2 to 14 days after being exposed to the virus. They include:  A fever.  A cough.  Difficulty breathing.  Chills.  Muscle pains.  A sore throat.  Loss of taste or smell. Some people may also have stomach problems, such as nausea, vomiting, or diarrhea. Other people may not have any symptoms of COVID-19. How is this diagnosed? This condition may be diagnosed based on:  Your signs and symptoms, especially if: ? You live in an area with a COVID-19 outbreak. ? You recently traveled to or from an area where the virus is common. ? You provide care for or live with a person who was diagnosed with COVID-19.  A physical exam.  Lab tests, which may include: ? A nasal swab to take a sample of fluid from your nose. ? A throat swab to take a sample of fluid from your throat. ? A sample of mucus from your lungs (sputum). ? Blood tests.  Imaging tests, which may include, X-rays, CT scan, or ultrasound. How is this treated? At present, there is no medicine to treat COVID-19. Medicines that treat other diseases are being  used on a trial basis to see if they are effective against COVID-19. Your health care provider will talk with you about ways to treat your symptoms. For most people, the infection is mild and can be managed at home with rest, fluids, and over-the-counter medicines. Treatment for a serious infection usually takes places in a hospital intensive care unit (ICU). It may include one or more of the following treatments. These treatments are given until your symptoms improve.  Receiving fluids and medicines through an IV.  Supplemental oxygen. Extra oxygen is given through a tube in the nose, a face mask, or a hood.  Positioning you to lie on your stomach (prone position). This makes it easier for oxygen to get into the lungs.  Continuous positive airway pressure (CPAP) or bi-level positive airway pressure (BPAP) machine. This treatment uses mild air pressure to keep the airways open. A tube that is connected to a motor delivers oxygen to the body.  Ventilator. This treatment moves air into and out of the lungs by using a tube that is placed in your windpipe.  Tracheostomy. This is a procedure to create a hole in the neck so that a breathing tube can be inserted.  Extracorporeal membrane oxygenation (ECMO). This procedure gives the lungs a chance to recover by taking over the functions of the heart and lungs. It supplies oxygen to the body and removes carbon dioxide. Follow these instructions at home: Lifestyle  If you are sick, stay home except to get medical care. Your health care provider will tell you how long to stay home. Call your health care provider before you go for medical care.  Rest at home as told by your health care provider.  Do not use any products that contain nicotine or tobacco, such as cigarettes, e-cigarettes, and chewing tobacco. If you need help quitting, ask your health care provider.  Return to your normal activities as told by your health care provider. Ask your health  care provider what activities are safe for you. General instructions  Take over-the-counter and prescription medicines only as told by your health care provider.  Drink enough fluid to keep your urine pale yellow.  Keep all follow-up visits as told by your health care provider. This is important. How is this prevented?  There is no vaccine to help prevent COVID-19 infection. However, there are steps you can take to protect yourself and others from this virus. To protect yourself:   Do not travel to areas where COVID-19 is a risk. The areas where COVID-19 is  reported change often. To identify high-risk areas and travel restrictions, check the CDC travel website: FatFares.com.br  If you live in, or must travel to, an area where COVID-19 is a risk, take precautions to avoid infection. ? Stay away from people who are sick. ? Wash your hands often with soap and water for 20 seconds. If soap and water are not available, use an alcohol-based hand sanitizer. ? Avoid touching your mouth, face, eyes, or nose. ? Avoid going out in public, follow guidance from your state and local health authorities. ? If you must go out in public, wear a cloth face covering or face mask. ? Disinfect objects and surfaces that are frequently touched every day. This may include:  Counters and tables.  Doorknobs and light switches.  Sinks and faucets.  Electronics, such as phones, remote controls, keyboards, computers, and tablets. To protect others: If you have symptoms of COVID-19, take steps to prevent the virus from spreading to others.  If you think you have a COVID-19 infection, contact your health care provider right away. Tell your health care team that you think you may have a COVID-19 infection.  Stay home. Leave your house only to seek medical care. Do not use public transport.  Do not travel while you are sick.  Wash your hands often with soap and water for 20 seconds. If soap and  water are not available, use alcohol-based hand sanitizer.  Stay away from other members of your household. Let healthy household members care for children and pets, if possible. If you have to care for children or pets, wash your hands often and wear a mask. If possible, stay in your own room, separate from others. Use a different bathroom.  Make sure that all people in your household wash their hands well and often.  Cough or sneeze into a tissue or your sleeve or elbow. Do not cough or sneeze into your hand or into the air.  Wear a cloth face covering or face mask. Where to find more information  Centers for Disease Control and Prevention: PurpleGadgets.be  World Health Organization: https://www.castaneda.info/ Contact a health care provider if:  You live in or have traveled to an area where COVID-19 is a risk and you have symptoms of the infection.  You have had contact with someone who has COVID-19 and you have symptoms of the infection. Get help right away if:  You have trouble breathing.  You have pain or pressure in your chest.  You have confusion.  You have bluish lips and fingernails.  You have difficulty waking from sleep.  You have symptoms that get worse. These symptoms may represent a serious problem that is an emergency. Do not wait to see if the symptoms will go away. Get medical help right away. Call your local emergency services (911 in the [place] Do not drive yourself to the hospital. Let the emergency medical personnel know if you think you have COVID-19. Summary  COVID-19 is a respiratory infection that is caused by a virus. It is also known as coronavirus disease or novel coronavirus. It can cause serious infections, such as pneumonia, acute respiratory distress syndrome, acute respiratory failure, or sepsis.  The virus that causes COVID-19 is contagious. This means that it can spread from person to person through  droplets from coughs and sneezes.  You are more likely to develop a serious illness if you are 44 years of age or older, have a weak immunity, live in a nursing home, or have  chronic disease.  There is no medicine to treat COVID-19. Your health care provider will talk with you about ways to treat your symptoms.  Take steps to protect yourself and others from infection. Wash your hands often and disinfect objects and surfaces that are frequently touched every day. Stay away from people who are sick and wear a mask if you are sick. This information is not intended to replace advice given to you by your health care provider. Make sure you discuss any questions you have with your health care provider. Document Released: 01/06/2019 Document Revised: 04/28/2019 Document Reviewed: 01/06/2019 Elsevier Patient Education  2020 [employer].

## 2019-12-13 NOTE — Discharge Summary (Signed)
Physician Discharge Summary  Max [name] EPP:[id number] DOB: 1951-03-11 DOA: 12/11/2019  PCP: Vivi Barrack, MD  Admit date: 12/11/2019 Discharge date: 12/13/2019  Admitted From: Home Disposition: Home  Recommendations for Outpatient Follow-up:  1. Follow up with PCP in 1-2 weeks 2. Continue remdesivir infusion at [facility] infusion clinic on 12/14/2019 and 12/15/2019 to complete 5-day course. 3. Continue Decadron 6 mg p.o. daily to complete 10-day course 4. Continue vitamin C, multivitamin, zinc, and Combivent  Home Health: No Equipment/Devices: None  Discharge Condition: Stable CODE STATUS: Full code Diet recommendation: Heart healthy  History of present illness:  Max [name] a 68 y.o.malewith medical history significant forparoxysmal A. 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 Due to his symptoms, patient got tested at [facility], which came back positive on 12/07/2019.Patient have been using Tylenol, cough suppressants for his symptoms. Patient continues to have worsening symptoms, most notable for significant shortness of breath, generalized weakness and an episode of possible syncope for a few seconds as described by his spouse. Of note, spouse was diagnosed with COVID-19 back in November, and at that time patient tested negative. Due to worsening symptoms, with near syncope, EMS was called, patient was noted to be orthostatic and saturating around 93% on room air. Patient denies any chest pain, abdominal pain, nausea/vomiting, diarrhea.   ED Course:In the ED, temp noted to be about 100.4,saturating 93% on room air. Lab showedferritin 404, CRP 3.1, procalcitonin negative, lactic acid within normal limits, no leukocytosis.Chest x-ray  showed new faint infiltrate at both lung bases, with atelectasis. Patient started on Decadron, and remdesivir. Patient admitted for further management  Hospital course:  Acute hypoxic respiratory failure secondary to acute Covid-19 viral pneumonia during the ongoing 2020 Covid 19 Pandemic - POA Patient presenting to ED with complaints of weakness, nonproductive cough, shortness of breath. Recent Covid-19 test at [facility] positive on 12/07/2019.  Patient with history of close contact, spouse with Covid-19 in November 2020.  Chest x-ray with faint infiltrates bilateral lung bases with atelectasis versus effusion left base.  Fever 100.4 on presentation with HR 106 and RR 29.  D-dimer 1.14, ferritin 404, LDH 214 and CRP 3.1.  Patient was started on remdesivir and Decadron.  He initially required supplemental oxygen which was titrated off.  He was supported with vitamin C, zinc, Tylenol and Combivent MDI.  His inflammatory markers trended down during hospitalization.  Will discharge home and continue remdesivir infusions at [facility] infusion center that are scheduled on 12/14/2019 and 12/15/2019 at 2:30 PM.  Orthostatic hypotension resolved. On EMS arrival, patient was noted to be orthostatic.  Received IV fluid resuscitation by EMS and in the ED.  Seen by physical therapy, no acute needs inpatient or on discharge.  Continue to encourage increased oral intake.  Essential hypertension Continue verapamil 180 mg p.o. daily  Rheumatoid arthritis methotrexate 10 mg weekly, q. Sundays  Discharge Diagnoses:  Principal Problem:   Pneumonia due to COVID-19 virus Active Problems:   Essential hypertension   Rheumatoid arthritis ([place])   Paroxysmal atrial fibrillation [facility])    Discharge Instructions  Discharge Instructions    Call MD for:  difficulty breathing, headache or visual disturbances   Complete by: As directed    Call MD for:  extreme fatigue   Complete by: As directed  Call MD for:   persistant dizziness or light-headedness   Complete by: As directed    Call MD for:  persistant nausea and vomiting   Complete by: As directed    Call MD for:  severe uncontrolled pain   Complete by: As directed    Call MD for:  temperature >100.4   Complete by: As directed    Diet - low sodium heart healthy   Complete by: As directed    Discharge instructions   Complete by: As directed    Patient scheduled for outpatient Remdesivir infusion at 230PM on Wednesday 12/30 and Thursday 12/31.  Please advise them to report to [facility] at [address]  Drive to the security guard and tell them you are here for an infusion. They will direct you to the front entrance where we will come and get you.  For questions call [phone].  Thanks   Increase activity slowly   Complete by: As directed      Allergies as of 12/13/2019      Reactions   Vicodin [hydrocodone-acetaminophen] Nausea And Vomiting      Medication List    TAKE these medications   acyclovir 800 MG tablet Commonly known as: ZOVIRAX Take 1 tablet (800 mg total) by mouth 3 (three) times daily as needed. For outbreaks   aspirin 81 MG chewable tablet Chew by mouth daily.   dexamethasone 6 MG tablet Commonly known as: DECADRON Take 1 tablet (6 mg total) by mouth daily for 8 days. Start taking on: December 14, 6947   folic acid 546 MCG tablet Commonly known as: FOLVITE Take 400 mcg by mouth daily.   Ipratropium-Albuterol 20-100 MCG/ACT Aers respimat Commonly known as: COMBIVENT Inhale 1 puff into the lungs every 6 (six) hours.   methotrexate 2.5 MG tablet Commonly known as: RHEUMATREX TAKE 4 TABLETS BY MOUTH WEEKLY What changed:   how much to take  how to take this  when to take this  additional instructions   multivitamin tablet Take 1 tablet by mouth daily.   omeprazole 20 MG capsule Commonly known as: PRILOSEC Take 1 capsule (20 mg total) by mouth daily.   sildenafil 20 MG  tablet Commonly known as: Revatio Take 1 to 2 tabs 2 - 3 hours before sex What changed:   how much to take  how to take this  when to take this  additional instructions   verapamil 180 MG CR tablet Commonly known as: CALAN-SR Take 1 tablet (180 mg total) by mouth daily.   VITAMIN C PO Take 1 tablet by mouth daily.   zinc sulfate 220 (50 Zn) MG capsule Take 1 capsule (220 mg total) by mouth daily. Start taking on: December 14, 2019      Follow-up Information    Vivi Barrack, MD. Schedule an appointment as soon as possible for a visit in 1 week(s).   Specialty: Family Medicine Contact information: [place] [address] [phone]        [facility] Follow up on 12/14/2019.   Why: Patient scheduled for outpatient Remdesivir infusion at 230PM on Wednesday 12/30 and Thursday 12/31.  Drive to the security guard and tell them you are here for an infusion. For questions call [phone] Contact information: [place] [address] [phone]         Allergies  Allergen Reactions  . Vicodin [Hydrocodone-Acetaminophen] Nausea And Vomiting    Consultations:  None  Procedures/Studies: DG Chest Port 1 View  Result Date: 12/11/2019 CLINICAL DATA:  Shortness of breath. COVID-19. EXAM: PORTABLE CHEST 1 VIEW COMPARISON:  Chest x-ray dated 01/17/2016 and chest CT dated 08/20/2018 FINDINGS: There are new faint infiltrates at both lung bases. Heart size and pulmonary vascularity are normal. Atelectasis or effusion at the left lung base is well. No bone abnormality. IMPRESSION: 1. New faint infiltrates at both lung bases. 2. New atelectasis or effusion at the left lung base. 3. No other significant abnormalities. Electronically Signed   By: Lorriane Shire M.D.   On: 12/11/2019 14:00      Subjective: Patient seen and examined at bedside, resting comfortably.  Now titrated off supplemental oxygen.   Seen by PT yesterday with no active needs.  Ready for discharge home.  Instructed patient will continue remdesivir infusion outpatient at the [facility] for 2 more days to complete 5-day course.  Will also continue oral steroids to complete a 10-day course.  No other complaints or concerns at this time.  Denies headache, no fever/chills/night sweats, no nausea/vomiting/diarrhea, no chest pain, no palpitations, no shortness of breath, no abdominal pain, no weakness, no fatigue, no paresthesias.  No acute events overnight per nursing staff.  Discharge Exam: Vitals:   12/12/19 2148 12/13/19 0439  BP: 135/72 140/78  Pulse: 61 65  Resp: 16 16  Temp: 97.7 F (36.5 C) 97.7 F (36.5 C)  SpO2: 95% 97%   Vitals:   12/12/19 1644 12/12/19 1731 12/12/19 2148 12/13/19 0439  BP: 124/76 (!) 158/74 135/72 140/78  Pulse: (!) 105 91 61 65  Resp: 16 16 16 16   Temp:  98 F (36.7 C) 97.7 F (36.5 C) 97.7 F (36.5 C)  TempSrc:  Oral Oral Oral  SpO2: 95% 97% 95% 97%  Weight:  88.5 kg    Height:  6' (1.829 m)      General: Pt is alert, awake, not in acute distress Cardiovascular: RRR, S1/S2 +, no rubs, no gallops Respiratory: CTA bilaterally, no wheezing, no rhonchi, oxygenating well on room air Abdominal: Soft, NT, ND, bowel sounds + Extremities: no edema, no cyanosis    The results of significant diagnostics from this hospitalization (including imaging, microbiology, ancillary and laboratory) are listed below for reference.     Microbiology: Recent Results (from the past 240 hour(s))  Novel Coronavirus, NAA ([facility])     Status: Abnormal   Collection Time: 12/07/19 10:16 AM   Specimen: Nasopharyngeal(NP) swabs in vial transport medium   NASOPHARYNGE  TESTING  Result Value Ref Range Status   SARS-CoV-2, NAA Detected (A) Not Detected Final    Comment: This nucleic acid amplification test was developed and its performance characteristics determined by [employer]. Nucleic  acid amplification tests include PCR and TMA. This test has not been FDA cleared or approved. This test has been authorized by FDA under an Emergency Use Authorization (EUA). This test is only authorized for the duration of time the declaration that circumstances exist justifying the authorization of the emergency use of in vitro diagnostic tests for detection of SARS-CoV-2 virus and/or diagnosis of COVID-19 infection under section 564(b)(1) of the Act, 21 U.S.C. 366YQI-3(K) (1), unless the authorization is terminated or revoked sooner. When diagnostic testing is negative, the possibility of a false negative result should be considered in the context of a patient's recent exposures and the presence of clinical signs and symptoms consistent with COVID-19. An individual without symptoms of COVID-19 and who is not shedding SARS-CoV-2 virus would  expect to have a negative (not detected) result in this assay.   Blood Culture (routine x 2)     Status: None (Preliminary result)   Collection Time: 12/11/19  1:52 PM   Specimen: BLOOD  Result Value Ref Range Status   Specimen Description   Final    BLOOD RIGHT ANTECUBITAL Performed at [facility] [address], [place], [place] [address]    Special Requests   Final    BOTTLES DRAWN AEROBIC AND ANAEROBIC Blood Culture adequate volume Performed at [facility] [address] [place], [place] [address]    Culture   Final    NO GROWTH < 24 HOURS Performed at [facility] [address] [place], [place] [address]    Report Status PENDING  Incomplete  Blood Culture (routine x 2)     Status: None (Preliminary result)   Collection Time: 12/11/19  1:52 PM   Specimen: BLOOD LEFT HAND  Result Value Ref Range Status   Specimen Description   Final    BLOOD LEFT HAND Performed at [facility] [address] [place], [place] [address]    Special Requests   Final    BOTTLES  DRAWN AEROBIC AND ANAEROBIC Blood Culture adequate volume Performed at [facility] [address], [place], [place] [address]    Culture   Final    NO GROWTH < 24 HOURS Performed at [facility] [address], [place],  [address]    Report Status PENDING  Incomplete     Labs: BNP (last 3 results) No results for input(s): BNP in the last 8760 hours. Basic Metabolic Panel: Recent Labs  Lab 12/11/19 1352 12/13/19 0400  NA 137 140  K 4.0 4.2  CL 100 106  CO2 25 25  GLUCOSE 110* 129*  BUN 18 28*  CREATININE 0.93 0.74  CALCIUM 8.4* 8.5*   Liver Function Tests: Recent Labs  Lab 12/11/19 1352 12/13/19 0400  AST 40 39  ALT 44 52*  ALKPHOS 87 65  BILITOT 0.5 0.6  PROT 7.2 6.1*  ALBUMIN 3.5 2.9*   No results for input(s): LIPASE, AMYLASE in the last 168 hours. No results for input(s): AMMONIA in the last 168 hours. CBC: Recent Labs  Lab 12/11/19 1352 12/13/19 0400  WBC 4.9 6.9  NEUTROABS 3.8 4.7  HGB 14.9 13.8  HCT 44.6 41.9  MCV 100.5* 100.0  PLT 277 302   Cardiac Enzymes: No results for input(s): CKTOTAL, CKMB, CKMBINDEX, TROPONINI in the last 168 hours. BNP: Invalid input(s): POCBNP CBG: No results for input(s): GLUCAP in the last 168 hours. D-Dimer Recent Labs    12/11/19 1352 12/13/19 0400  DDIMER 1.14* 0.78*   Hgb A1c No results for input(s): HGBA1C in the last 72 hours. Lipid Profile Recent Labs    12/11/19 1352  TRIG 114   Thyroid function studies No results for input(s): TSH, T4TOTAL, T3FREE, THYROIDAB in the last 72 hours.  Invalid input(s): FREET3 Anemia work up Recent Labs    12/11/19 1352 12/13/19 0400  FERRITIN 404* 408*   Urinalysis    Component Value Date/Time   COLORURINE YELLOW 02/04/2014 0820   APPEARANCEUR CLOUDY (A) 02/04/2014 0820   LABSPEC 1.028 02/04/2014 0820   PHURINE 7.5 02/04/2014 0820   GLUCOSEU NEGATIVE 02/04/2014 0820   HGBUR NEGATIVE 02/04/2014 0820   HGBUR negative  10/11/2010 0751   BILIRUBINUR N 05/11/2018 [place] 02/04/2014 0820  PROTEINUR Negative 05/11/2018 1120   PROTEINUR 30 (A) 02/04/2014 0820   UROBILINOGEN 0.2 05/11/2018 1120   UROBILINOGEN 1.0 02/04/2014 0820   NITRITE N 05/11/2018 1120   NITRITE NEGATIVE 02/04/2014 0820   LEUKOCYTESUR Negative 05/11/2018 1120   Sepsis Labs Invalid input(s): PROCALCITONIN,  WBC,  LACTICIDVEN Microbiology Recent Results (from the past 240 hour(s))  Novel Coronavirus, NAA ([facility])     Status: Abnormal   Collection Time: 12/07/19 10:16 AM   Specimen: Nasopharyngeal(NP) swabs in vial transport medium   NASOPHARYNGE  TESTING  Result Value Ref Range Status   SARS-CoV-2, NAA Detected (A) Not Detected Final    Comment: This nucleic acid amplification test was developed and its performance characteristics determined by [employer]. Nucleic acid amplification tests include PCR and TMA. This test has not been FDA cleared or approved. This test has been authorized by FDA under an Emergency Use Authorization (EUA). This test is only authorized for the duration of time the declaration that circumstances exist justifying the authorization of the emergency use of in vitro diagnostic tests for detection of SARS-CoV-2 virus and/or diagnosis of COVID-19 infection under section 564(b)(1) of the Act, 21 U.S.C. 841LKG-4(W) (1), unless the authorization is terminated or revoked sooner. When diagnostic testing is negative, the possibility of a false negative result should be considered in the context of a patient's recent exposures and the presence of clinical signs and symptoms consistent with COVID-19. An individual without symptoms of COVID-19 and who is not shedding SARS-CoV-2 virus would  expect to have a negative (not detected) result in this assay.   Blood Culture (routine x 2)     Status: None (Preliminary result)   Collection Time: 12/11/19  1:52 PM   Specimen: BLOOD  Result  Value Ref Range Status   Specimen Description   Final    BLOOD RIGHT ANTECUBITAL Performed at [facility] [address], [place], [place] [address]    Special Requests   Final    BOTTLES DRAWN AEROBIC AND ANAEROBIC Blood Culture adequate volume Performed at [facility] [address] [place], [place] [address]    Culture   Final    NO GROWTH < 24 HOURS Performed at [facility] [address], [place], [place] [address]    Report Status PENDING  Incomplete  Blood Culture (routine x 2)     Status: None (Preliminary result)   Collection Time: 12/11/19  1:52 PM   Specimen: BLOOD LEFT HAND  Result Value Ref Range Status   Specimen Description   Final    BLOOD LEFT HAND Performed at [facility] [address], [place], [place] [address]    Special Requests   Final    BOTTLES DRAWN AEROBIC AND ANAEROBIC Blood Culture adequate volume Performed at [facility] [address] [place], [place] [address]    Culture   Final    NO GROWTH < 24 HOURS Performed at [facility] [address] [place], [place] [address]    Report Status PENDING  Incomplete     Time coordinating discharge: Over 30 minutes  SIGNED:   Donnamarie Poag [place] DO  [facility] 12/13/2019, 10:20 AM

## 2019-12-13 NOTE — Progress Notes (Signed)
OT Cancellation Note  Patient Details Name: Max [name] MRN: [id number] DOB: [birth year]   Cancelled Treatment:    Reason Eval/Treat Not Completed: OT screened, no needs identified, will sign off.  Pt did well with PT. Called into pts room and talked to him. He does not feel he has any OT needs at this time. Will sign off.  Cable Fearn 12/13/2019, 11:38 AM  Karsten Ro, OTR/L Acute Rehabilitation Services 12/13/2019

## 2019-12-14 ENCOUNTER — Ambulatory Visit (HOSPITAL_COMMUNITY)
Admission: RE | Admit: 2019-12-14 | Discharge: 2019-12-14 | Disposition: A | Discharge status: Home / Self Care | Payer: Medicare Other | Source: Ambulatory Visit | Attending: Pulmonary Disease | Admitting: Pulmonary Disease

## 2019-12-14 DIAGNOSIS — J1289 Other viral pneumonia: Secondary | ICD-10-CM | POA: Insufficient documentation

## 2019-12-14 DIAGNOSIS — U071 COVID-19: Secondary | ICD-10-CM | POA: Insufficient documentation

## 2019-12-14 MED ORDER — FAMOTIDINE IN NACL 20-0.9 MG/50ML-% IV SOLN: 20.0000 mg | Freq: Once | INTRAVENOUS | Status: DC | PRN

## 2019-12-14 MED ORDER — DIPHENHYDRAMINE HCL 50 MG/ML IJ SOLN: 50.0000 mg | Freq: Once | INTRAMUSCULAR | Status: DC | PRN

## 2019-12-14 MED ORDER — METHYLPREDNISOLONE SODIUM SUCC 125 MG IJ SOLR: 125.0000 mg | Freq: Once | INTRAMUSCULAR | Status: DC | PRN

## 2019-12-14 MED ORDER — SODIUM CHLORIDE 0.9 % IV SOLN
INTRAVENOUS | Status: AC
  Filled 2019-12-14: qty 20

## 2019-12-14 MED ORDER — SODIUM CHLORIDE 0.9 % IV SOLN: INTRAVENOUS | Status: DC | PRN

## 2019-12-14 MED ORDER — EPINEPHRINE 0.3 MG/0.3ML IJ SOAJ: 0.3000 mg | Freq: Once | INTRAMUSCULAR | Status: DC | PRN

## 2019-12-14 MED ORDER — SODIUM CHLORIDE 0.9 % IV SOLN: 100.0000 mg | Freq: Once | INTRAVENOUS | Status: DC

## 2019-12-14 MED ORDER — ALBUTEROL SULFATE HFA 108 (90 BASE) MCG/ACT IN AERS: 2.0000 | INHALATION_SPRAY | Freq: Once | RESPIRATORY_TRACT | Status: DC | PRN

## 2019-12-14 MED ORDER — SODIUM CHLORIDE 0.9 % IV SOLN
100.0000 mg | Freq: Once | INTRAVENOUS | Status: AC
  Administered 2019-12-14: 15:00:00 100 mg via INTRAVENOUS

## 2019-12-14 NOTE — Progress Notes (Signed)
.   Diagnosis: COVID-19  Physician: Dr. Lamar Benes  Procedure: Covid Infusion Clinic Med: remdesivir infusion.  Complications: No immediate complications noted.  Discharge: Discharged home   Max [name] A 12/14/2019

## 2019-12-15 ENCOUNTER — Ambulatory Visit (HOSPITAL_COMMUNITY)
Admission: RE | Admit: 2019-12-15 | Discharge: 2019-12-15 | Disposition: A | Discharge status: Home / Self Care | Payer: Medicare Other | Source: Ambulatory Visit | Attending: Pulmonary Disease | Admitting: Pulmonary Disease

## 2019-12-15 DIAGNOSIS — J1289 Other viral pneumonia: Secondary | ICD-10-CM | POA: Insufficient documentation

## 2019-12-15 DIAGNOSIS — U071 COVID-19: Secondary | ICD-10-CM | POA: Insufficient documentation

## 2019-12-15 MED ORDER — ALBUTEROL SULFATE HFA 108 (90 BASE) MCG/ACT IN AERS: 2.0000 | INHALATION_SPRAY | Freq: Once | RESPIRATORY_TRACT | Status: DC | PRN

## 2019-12-15 MED ORDER — METHYLPREDNISOLONE SODIUM SUCC 125 MG IJ SOLR: 125.0000 mg | Freq: Once | INTRAMUSCULAR | Status: DC | PRN

## 2019-12-15 MED ORDER — FAMOTIDINE IN NACL 20-0.9 MG/50ML-% IV SOLN: 20.0000 mg | Freq: Once | INTRAVENOUS | Status: DC | PRN

## 2019-12-15 MED ORDER — EPINEPHRINE 0.3 MG/0.3ML IJ SOAJ: 0.3000 mg | Freq: Once | INTRAMUSCULAR | Status: DC | PRN

## 2019-12-15 MED ORDER — SODIUM CHLORIDE 0.9 % IV SOLN: 100.0000 mg | Freq: Once | INTRAVENOUS | Status: AC

## 2019-12-15 MED ORDER — DIPHENHYDRAMINE HCL 50 MG/ML IJ SOLN: 50.0000 mg | Freq: Once | INTRAMUSCULAR | Status: DC | PRN

## 2019-12-15 MED ORDER — SODIUM CHLORIDE 0.9 % IV SOLN
INTRAVENOUS | Status: DC | PRN
  Administered 2019-12-15: 250 mL via INTRAVENOUS

## 2019-12-15 MED ORDER — SODIUM CHLORIDE 0.9 % IV SOLN
INTRAVENOUS | Status: AC
  Administered 2019-12-15: 100 mg via INTRAVENOUS
  Filled 2019-12-15: qty 20

## 2019-12-16 ENCOUNTER — Telehealth: Payer: Self-pay | Admitting: Internal Medicine

## 2019-12-16 LAB — CULTURE, BLOOD (ROUTINE X 2)
Culture: NO GROWTH
Special Requests: ADEQUATE

## 2019-12-16 NOTE — Telephone Encounter (Signed)
Called by Access Nurse, Claiborne Billings, regarding blood culture collected 12/27 while an inpatient at [facility] - positive for Gram + Rods.  Other blood culture is NGTD.    Reviewed chart.  Seen yesterday for Remdesivir infusion at [facility].  CBC normal on 12/29.     Given one culture neg and one culture positive with GPR - likely contaminate.  Advise awaiting final results of culture - no action taken today.

## 2019-12-18 LAB — CULTURE, BLOOD (ROUTINE X 2): Special Requests: ADEQUATE

## 2019-12-23 ENCOUNTER — Ambulatory Visit: Payer: Medicare Other | Admitting: Family Medicine

## 2020-01-02 ENCOUNTER — Ambulatory Visit: Payer: Medicare Other | Admitting: Family Medicine

## 2020-01-03 DIAGNOSIS — H1711 Central corneal opacity, right eye: Secondary | ICD-10-CM | POA: Diagnosis not present

## 2020-01-03 DIAGNOSIS — H5319 Other subjective visual disturbances: Secondary | ICD-10-CM | POA: Diagnosis not present

## 2020-01-03 DIAGNOSIS — H35363 Drusen (degenerative) of macula, bilateral: Secondary | ICD-10-CM | POA: Diagnosis not present

## 2020-01-15 ENCOUNTER — Other Ambulatory Visit: Payer: Self-pay | Admitting: Family Medicine

## 2020-02-05 ENCOUNTER — Ambulatory Visit: Payer: Medicare Other | Attending: Internal Medicine

## 2020-02-05 DIAGNOSIS — Z23 Encounter for immunization: Secondary | ICD-10-CM | POA: Insufficient documentation

## 2020-02-05 NOTE — Progress Notes (Signed)
   Covid-19 Vaccination Clinic  Name:  Max [name]    MRN: [id number] DOB: [birth year]  02/05/2020  [name] was observed post Covid-19 immunization for 15 minutes without incidence. He was provided with Vaccine Information Sheet and instruction to access the V-Safe system.   [name] was instructed to call 911 with any severe reactions post vaccine: Marland Kitchen Difficulty breathing  . Swelling of your face and throat  . A fast heartbeat  . A bad rash all over your body  . Dizziness and weakness    Immunizations Administered    Name Date Dose VIS Date Route   Pfizer COVID-19 Vaccine 02/05/2020  8:24 AM 0.3 mL 11/25/2019 Intramuscular   Manufacturer: [place]   Lot: [id number]   [place]: [id number]

## 2020-02-28 ENCOUNTER — Ambulatory Visit: Payer: Medicare Other | Attending: Internal Medicine

## 2020-02-28 DIAGNOSIS — Z23 Encounter for immunization: Secondary | ICD-10-CM

## 2020-02-28 NOTE — Progress Notes (Signed)
   Covid-19 Vaccination Clinic  Name:  Max [name]    MRN: [id number] DOB: 1951/09/23  02/28/2020  Mr. [name] was observed post Covid-19 immunization for 15 minutes without incident. He was provided with Vaccine Information Sheet and instruction to access the V-Safe system.   Mr. [name] was instructed to call 911 with any severe reactions post vaccine: Marland Kitchen Difficulty breathing  . Swelling of face and throat  . A fast heartbeat  . A bad rash all over body  . Dizziness and weakness   Immunizations Administered    Name Date Dose VIS Date Route   Pfizer COVID-19 Vaccine 02/28/2020  2:28 PM 0.3 mL 11/25/2019 Intramuscular   Manufacturer: [place]   Lot: [id number]   [place]: [id number]

## 2020-03-14 ENCOUNTER — Telehealth: Payer: Self-pay | Admitting: Family Medicine

## 2020-03-14 ENCOUNTER — Encounter: Payer: Self-pay | Admitting: Physician Assistant

## 2020-03-14 ENCOUNTER — Other Ambulatory Visit: Payer: Self-pay

## 2020-03-14 ENCOUNTER — Ambulatory Visit (INDEPENDENT_AMBULATORY_CARE_PROVIDER_SITE_OTHER): Payer: Medicare Other | Admitting: Physician Assistant

## 2020-03-14 VITALS — BP 126/80 | HR 67 | Temp 98.2°F | Resp 14 | Ht 72.0 in | Wt 200.6 lb

## 2020-03-14 DIAGNOSIS — H938X1 Other specified disorders of right ear: Secondary | ICD-10-CM

## 2020-03-14 MED ORDER — AMOXICILLIN 875 MG PO TABS: 875.0000 mg | ORAL_TABLET | Freq: Two times a day (BID) | ORAL | 0 refills | Status: DC

## 2020-03-14 NOTE — Progress Notes (Signed)
Max [name] is a 69 y.o. male here for a new problem.  History of Present Illness:   Chief Complaint  Patient presents with  . Ear Fullness    right ear, started Saturday, tried several home remedies with no relief    HPI   Patient reports that he has ear fullness x a few days. Does have history of requiring ear lavage during a visit. Has decreased hearing but not loss of hearing.  Denies: pain, discharge, malaise, sore throat  Does have recent allergies and slight nasal discharge.  Past Medical History:  Diagnosis Date  . Abdominal hernia 11/28/2015  . Allergic rhinitis   . Anxiety   . BPV (benign positional vertigo), right 05/11/2018  . Cardiac arrhythmia   . ED (erectile dysfunction)   . Herpes   . History of syncope    in the setting of anxiety per cardiology note  . Hypertension   . Kidney tumor (benign), right    sx 02/27/2015  . Meningioma ([place])    had surgery   . Paroxysmal atrial fibrillation (HCC) 07/15/2012   a) CHADSVASc score 1b) On full-dose ASA and Verapamil  . RA (rheumatoid arthritis) ([place])   . Seizures ([place]) 05/14/2011   "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   . Vasovagal syncope      Social History   Socioeconomic History  . Marital status: Married    Spouse name: Not on file  . Number of children: 1  . Years of education: Not on file  . Highest education level: Not on file  Occupational History    Comment: [employer]   Tobacco Use  . Smoking status: Never Smoker  . Smokeless tobacco: Never Used  Substance and Sexual Activity  . Alcohol use: No  . Drug use: No  . Sexual activity: Yes  Other Topics Concern  . Not on file  Social History Narrative   Regular Exercise   Social Determinants of Health   Financial Resource Strain:   . Difficulty of Paying Living Expenses:   Food Insecurity:   . Worried About Charity fundraiser in the Last Year:   . Arboriculturist in the Last Year:   Transportation Needs:   . Lexicographer (Medical):   Marland Kitchen Lack of Transportation (Non-Medical):   Physical Activity:   . Days of Exercise per Week:   . Minutes of Exercise per Session:   Stress:   . Feeling of Stress :   Social Connections:   . Frequency of Communication with Friends and Family:   . Frequency of Social Gatherings with Friends and Family:   . Attends Religious Services:   . Active Member of Clubs or Organizations:   . Attends Archivist Meetings:   Marland Kitchen Marital Status:   Intimate Partner Violence:   . Fear of Current or Ex-Partner:   . Emotionally Abused:   Marland Kitchen Physically Abused:   . Sexually Abused:     Past Surgical History:  Procedure Laterality Date  . BRAIN MENINGIOMA EXCISION  2012  . CATARACT EXTRACTION W/ INTRAOCULAR LENS IMPLANT  ~ 2001   right  . COLONOSCOPY  2008  . EYE SURGERY  1964   right; "hit w/baseball; eye hemorrhaged"  . FLEXIBLE SIGMOIDOSCOPY    . REFRACTIVE SURGERY  ~ 2003  . ROBOTIC ASSITED PARTIAL NEPHRECTOMY Right 02/29/2016   Procedure: XI ROBOTIC ASSITED PARTIAL NEPHRECTOMY;  Surgeon: Alexis Frock, MD;  Location: [facility];  Service: Urology;  Laterality: Right;   NEEDS ULTRASOUND    Family History  Problem Relation Age of Onset  . Depression Other   . Hypertension Other   . GI problems Other   . Lung disease Other   . Prostate cancer Brother   . Prostate cancer Paternal Grandfather   . Colon cancer Neg Hx   . Esophageal cancer Neg Hx   . Ulcerative colitis Neg Hx     Allergies  Allergen Reactions  . Vicodin [Hydrocodone-Acetaminophen] Nausea And Vomiting    Current Medications:   Current Outpatient Medications:  .  acyclovir (ZOVIRAX) 800 MG tablet, Take 1 tablet (800 mg total) by mouth 3 (three) times daily as needed. For outbreaks, Disp: 60 tablet, Rfl: 1 .  Ascorbic Acid (VITAMIN C PO), Take 1 tablet by mouth daily., Disp: , Rfl:  .  aspirin 81 MG chewable tablet, Chew by mouth daily., Disp: , Rfl:  .  folic acid (FOLVITE) 643 MCG  tablet, Take 400 mcg by mouth daily., Disp: , Rfl:  .  methotrexate (RHEUMATREX) 2.5 MG tablet, TAKE 4 TABLETS BY MOUTH WEEKLY (Patient taking differently: Take 10 mg by mouth once a week. Takes at bedtime to reduce stomach problems), Disp: 90 tablet, Rfl: 3 .  Multiple Vitamin (MULTIVITAMIN) tablet, Take 1 tablet by mouth daily., Disp: , Rfl:  .  omeprazole (PRILOSEC) 20 MG capsule, TAKE 1 CAPSULE BY MOUTH EVERY DAY, Disp: 90 capsule, Rfl: 3 .  sildenafil (REVATIO) 20 MG tablet, Take 1 to 2 tabs 2 - 3 hours before sex (Patient taking differently: Take 20-40 mg by mouth See admin instructions. Take 20mg -40mg  2-3 hours prior to sexual activity.), Disp: 20 tablet, Rfl: 11 .  verapamil (CALAN-SR) 180 MG CR tablet, Take 1 tablet (180 mg total) by mouth daily., Disp: 90 tablet, Rfl: 3 .  amoxicillin (AMOXIL) 875 MG tablet, Take 1 tablet (875 mg total) by mouth 2 (two) times daily., Disp: 20 tablet, Rfl: 0 .  Ipratropium-Albuterol (COMBIVENT) 20-100 MCG/ACT AERS respimat, Inhale 1 puff into the lungs every 6 (six) hours., Disp: 4 g, Rfl: 0   Review of Systems:   ROS  Negative unless otherwise specified per HPI.  Vitals:   Vitals:   03/14/20 0934  BP: 126/80  Pulse: 67  Resp: 14  Temp: 98.2 F (36.8 C)  TempSrc: Temporal  SpO2: 99%  Weight: 200 lb 9.6 oz (91 kg)  Height: 6' (1.829 m)     Body mass index is 27.21 kg/m.  Physical Exam:   Physical Exam Vitals and nursing note reviewed.  Constitutional:      General: He is not in acute distress.    Appearance: He is well-developed. He is not ill-appearing or toxic-appearing.  HENT:     Head: Normocephalic and atraumatic.     Right Ear: Ear canal and external ear normal. Tympanic membrane is erythematous. Tympanic membrane is not retracted or bulging.     Left Ear: Tympanic membrane, ear canal and external ear normal. Tympanic membrane is not erythematous, retracted or bulging.     Nose: Nose normal.     Right Sinus: No maxillary sinus  tenderness or frontal sinus tenderness.     Left Sinus: No maxillary sinus tenderness or frontal sinus tenderness.     Mouth/Throat:     Pharynx: Uvula midline. No oropharyngeal exudate or posterior oropharyngeal erythema.  Eyes:     General: Lids are normal.     Conjunctiva/sclera: Conjunctivae normal.  Neck:     Trachea:  Trachea normal.  Cardiovascular:     Rate and Rhythm: Normal rate and regular rhythm.     Heart sounds: Normal heart sounds, S1 normal and S2 normal.  Pulmonary:     Effort: Pulmonary effort is normal. No accessory muscle usage or respiratory distress.     Breath sounds: Normal breath sounds. No decreased breath sounds, wheezing, rhonchi or rales.  Lymphadenopathy:     Cervical: No cervical adenopathy.  Skin:    General: Skin is warm and dry.  Neurological:     Mental Status: He is alert.  Psychiatric:        Speech: Speech normal.        Behavior: Behavior normal. Behavior is cooperative.      Assessment and Plan:   Jameon was seen today for ear fullness.  Diagnoses and all orders for this visit:  Ear fullness, right  Other orders -     amoxicillin (AMOXIL) 875 MG tablet; Take 1 tablet (875 mg total) by mouth 2 (two) times daily.   Suspect possible early otitis media? R TM appears quite inflamed. Will start oral amoxicillin, hold MTX while on this medication. Follow-up if worsening precautions.  . Reviewed expectations re: course of current medical issues. . Discussed self-management of symptoms. . Outlined signs and symptoms indicating need for more acute intervention. . Patient verbalized understanding and all questions were answered. . See orders for this visit as documented in the electronic medical record. . Patient received an After-Visit Summary.  CMA or LPN served as scribe during this visit. History, Physical, and Plan performed by medical provider. The above documentation has been reviewed and is accurate and complete.  Inda Coke, PA-C

## 2020-03-14 NOTE — Telephone Encounter (Signed)
Please have patient hold his methotrexate while on amoxicillin.

## 2020-03-14 NOTE — Telephone Encounter (Signed)
Please give pharmacy ok to fill antibiotic.

## 2020-03-14 NOTE — Telephone Encounter (Signed)
Patient notified to hold Rx he voices understanding.

## 2020-03-14 NOTE — Telephone Encounter (Signed)
Called [facility] and spoke to [place] told her to please fill antibiotic for pt okay per [facility] and pt knows to hold his Methotrexate. Tammy verbalized understanding.

## 2020-03-14 NOTE — Patient Instructions (Signed)
It was great to see you!  Start oral amoxicillin for your ear. Please follow-up if no improvement.  Take care,  Inda Coke PA-C

## 2020-03-14 NOTE — Telephone Encounter (Signed)
Called and spoke to pt's wife Fraser Din told her I contacted the pharmacy and they are going to fill Rx. Pat verbalized understanding.

## 2020-03-14 NOTE — Telephone Encounter (Signed)
Please see [facility] from [facility].

## 2020-03-14 NOTE — Telephone Encounter (Signed)
[facility] saying that amoxicillin (AMOXIL) 875 MG tablet interferes with methotrexate (RHEUMATREX) 2.5 MG tablet by 60%

## 2020-04-25 ENCOUNTER — Other Ambulatory Visit: Payer: Self-pay

## 2020-04-25 ENCOUNTER — Telehealth: Payer: Self-pay | Admitting: Family Medicine

## 2020-04-25 MED ORDER — METHOTREXATE 2.5 MG PO TABS: ORAL_TABLET | ORAL | 3 refills | Status: DC

## 2020-04-25 NOTE — Telephone Encounter (Signed)
Rx Request 

## 2020-04-25 NOTE — Telephone Encounter (Signed)
MEDICATION: methotrexate 2.5 MG  PHARMACY: [facility]  Comments:   **Let patient know to contact pharmacy at the end of the day to make sure medication is ready. **  ** Please notify patient to allow 48-72 hours to process**  **Encourage patient to contact the pharmacy for refills or they can request refills through [facility]**

## 2020-04-25 NOTE — Telephone Encounter (Signed)
Ok with me. Please place any necessary orders. 

## 2020-04-25 NOTE — Telephone Encounter (Signed)
Rx sent in

## 2020-05-03 ENCOUNTER — Telehealth: Payer: Self-pay | Admitting: Family Medicine

## 2020-05-03 NOTE — Telephone Encounter (Signed)
Spoke with patient, stating doing Fine right now  Patient schedule appointment for tomorrow with PCP

## 2020-05-03 NOTE — Telephone Encounter (Signed)
Nurse Assessment Nurse: Durene Cal, RN, Brandi Date/Time (Eastern Time): 05/03/2020 2:44:34 PM Confirm and document reason for call. If symptomatic, describe symptoms. ---Caller states he is having periods of light headed and dizzy. Caller states this has worsened over the past couple of days and now is continuing throughout the day. Caller states when he exercises sometimes he notices this and other times he feels okay. Has the patient had close contact with a person known or suspected to have the novel coronavirus illness OR traveled / lives in area with major community spread (including international travel) in the last 14 days from the onset of symptoms? * If Asymptomatic, screen for exposure and travel within the last 14 days. ---No Does the patient have any new or worsening symptoms? ---Yes Will a triage be completed? ---Yes Related visit to physician within the last 2 weeks? ---No Does the PT have any chronic conditions? (i.e. diabetes, asthma, this includes High risk factors for pregnancy, etc.) ---Yes List chronic conditions. ---verapamil; heart palpitations that were linked to too much caffeine. Is this a behavioral health or substance abuse call? ---NoPLEASE NOTE: All timestamps contained within this report are represented as [place] Standard Time. CONFIDENTIALTY NOTICE: This fax transmission is intended only for the addressee. It contains information that is legally privileged, confidential or otherwise protected from use or disclosure. If you are not the intended recipient, you are strictly prohibited from reviewing, disclosing, copying using or disseminating any of this information or taking any action in reliance on or regarding this information. If you have received this fax in error, please notify us immediately by telephone so that we can arrange for its return to [place]. Phone: [phone], Toll-Free: [phone], Fax: [phone] Page: 2 of 2 Call Id:  [id number] Guidelines Guideline Title Affirmed Question Affirmed Notes Nurse Date/Time Eilene Ghazi Time) Dizziness - Lightheadedness [1] MODERATE dizziness (e.g., interferes with normal activities) AND [2] has NOT been evaluated by physician for this (Exception: dizziness caused by heat exposure, sudden standing, or poor fluid intake) Durene Cal, RN, Brandi 05/03/2020 2:47:41 PM Disp. Time Eilene Ghazi Time) Disposition Final User 05/03/2020 2:52:50 PM See PCP within 24 Hours Yes Durene Cal, RN, Velna Hatchet Caller Disagree/Comply Comply Caller Understands Yes PreDisposition Call Doctor Care Advice Given Per Guideline SEE PCP WITHIN 24 HOURS: DRINK FLUIDS: * Drink several glasses of fruit juice, other clear fluids or water. * This will improve hydration and blood glucose. * This will improve circulation and increase blood flow to the brain. * Lie down with feet elevated for 1 hour. * Passes out (faints) * You become worse.

## 2020-05-04 ENCOUNTER — Ambulatory Visit (INDEPENDENT_AMBULATORY_CARE_PROVIDER_SITE_OTHER): Payer: Medicare Other | Admitting: Family Medicine

## 2020-05-04 ENCOUNTER — Other Ambulatory Visit: Payer: Self-pay

## 2020-05-04 VITALS — BP 130/82 | HR 74 | Temp 98.5°F | Ht 72.0 in | Wt 199.6 lb

## 2020-05-04 DIAGNOSIS — R42 Dizziness and giddiness: Secondary | ICD-10-CM | POA: Diagnosis not present

## 2020-05-04 DIAGNOSIS — I480 Paroxysmal atrial fibrillation: Secondary | ICD-10-CM

## 2020-05-04 DIAGNOSIS — M79672 Pain in left foot: Secondary | ICD-10-CM

## 2020-05-04 LAB — COMPREHENSIVE METABOLIC PANEL
ALT: 23 U/L (ref 0–53)
AST: 19 U/L (ref 0–37)
Albumin: 4.6 g/dL (ref 3.5–5.2)
Alkaline Phosphatase: 74 U/L (ref 39–117)
BUN: 18 mg/dL (ref 6–23)
CO2: 28 mEq/L (ref 19–32)
Calcium: 9.5 mg/dL (ref 8.4–10.5)
Chloride: 106 mEq/L (ref 96–112)
Creatinine, Ser: 1.16 mg/dL (ref 0.40–1.50)
GFR: 62.31 mL/min (ref >60.00)
Glucose, Bld: 113 mg/dL — ABNORMAL HIGH (ref 70–99)
Potassium: 4.3 mEq/L (ref 3.5–5.1)
Sodium: 138 mEq/L (ref 135–145)
Total Bilirubin: 0.6 mg/dL (ref 0.2–1.2)
Total Protein: 7 g/dL (ref 6.0–8.3)

## 2020-05-04 LAB — TSH: TSH: 2.38 u[IU]/mL (ref 0.35–4.50)

## 2020-05-04 LAB — CBC
HCT: 43.9 % (ref 39.0–52.0)
Hemoglobin: 14.9 g/dL (ref 13.0–17.0)
MCHC: 33.9 g/dL (ref 30.0–36.0)
MCV: 100 fl (ref 78.0–100.0)
Platelets: 386 10*3/uL (ref 150.0–400.0)
RBC: 4.39 Mil/uL (ref 4.22–5.81)
RDW: 13.9 % (ref 11.5–15.5)
WBC: 5.8 10*3/uL (ref 4.0–10.5)

## 2020-05-04 LAB — POCT URINALYSIS DIPSTICK
Bilirubin, UA: NEGATIVE
Blood, UA: NEGATIVE
Glucose, UA: NEGATIVE
Ketones, UA: NEGATIVE
Leukocytes, UA: NEGATIVE
Nitrite, UA: NEGATIVE
Protein, UA: NEGATIVE
Spec Grav, UA: 1.015 (ref 1.010–1.025)
Urobilinogen, UA: 0.2 E.U./dL
pH, UA: 6 (ref 5.0–8.0)

## 2020-05-04 NOTE — Patient Instructions (Signed)
It was very nice to see you today!  You may be slightly dehydrated.  Please avoid the caffeine and get plenty of fluids.  We will check blood work today to make sure there is nothing else going on.  Please let me know if your symptoms are worsening or not improving in the next couple of weeks and we will get a Holter monitor.  Take care, Dr [name]  Please try these tips to maintain a healthy lifestyle:   Eat at least 3 REAL meals and 1-2 snacks per day.  Aim for no more than 5 hours between eating.  If you eat breakfast, please do so within one hour of getting up.    Each meal should contain half fruits/vegetables, one quarter protein, and one quarter carbs (no bigger than a computer mouse)   Cut down on sweet beverages. This includes juice, soda, and sweet tea.     Drink at least 1 glass of water with each meal and aim for at least 8 glasses per day   Exercise at least 150 minutes every week.

## 2020-05-04 NOTE — Progress Notes (Signed)
   Max [name] is a 69 y.o. male who presents today for an office visit.  Assessment/Plan:  New/Acute Problems: Lightheadedness No red flags.  Normal UA.  He had a few premature beats on cardiac exam but EKG was normal.  Possibly due to dehydration.  Patient will abstain from caffeine for the next couple weeks to make sure that he is getting at least 64 ounces of fluid daily.  We will also check labs including CBC, C met, and TSH.  If no improvement in the next 1 to 2 weeks would consider referral to cardiology or Holter monitor.  Discussed reasons to return to care.  Left Foot Pain Ganglion cyst versus neuroma.  Patient would like to continue with watchful waiting for now.     Subjective:  HPI:  He has had dizziness intermittently for the past 3 months or so.  No clear precipitating events. He had an ear infection a few months ago that was treated with amoxicillin. Symptoms happen almost everyday and can last for a few hours. No other symptoms.  Dizziness is described as a lightheaded sensation.  Denies passing out.  No vertigo symptoms.  No weakness or numbness.  No nausea or vomiting.  He is able to walk and exercise normally.  No chest pain or shortness of breath.  He will also feel very pronounced heartbeats when he is having his symptoms.  He had something similar several years ago that improved after cutting out caffeine.        Objective:  Physical Exam: BP 130/82 (BP Location: Left Arm, Patient Position: Sitting, Cuff Size: Normal)   Pulse 74   Temp 98.5 F (36.9 C) (Temporal)   Ht 6' (1.829 m)   Wt 199 lb 9.6 oz (90.5 kg)   SpO2 98%   BMI 27.07 kg/m   Gen: No acute distress, resting comfortably CV: Regular rate and rhythm with no murmurs appreciated Pulm: Normal work of breathing, clear to auscultation bilaterally with no crackles, wheezes, or rhonchi Neuro: Grossly normal, moves all extremities Psych: Normal affect and thought content MSK: Approximately 2 to 3  mm freely mobile nodule on the inferior aspect of left fifth MTP joint.  EKG: NSR, no ischemic changes.   Time Spent: >30 minutes of total time was spent on the date of the encounter performing the following actions: chart review prior to seeing the patient, obtaining history, performing a medically necessary exam, counseling on the treatment plan, placing orders, and documenting in our EHR.        Algis Greenhouse. [name], MD 05/04/2020 1:50 PM

## 2020-05-07 NOTE — Progress Notes (Signed)
Please inform patient of the following:  Blood work is all STABLE. No obvious explanation for his symptoms. Would like for him to continue with the treatment plan we discussed at his visit and would like for him to let us know if symptoms are not improving.  Max [name]. [name], MD 05/07/2020 12:56 PM

## 2020-07-06 ENCOUNTER — Encounter: Payer: Self-pay | Admitting: Family Medicine

## 2020-07-06 ENCOUNTER — Other Ambulatory Visit: Payer: Self-pay

## 2020-07-06 ENCOUNTER — Ambulatory Visit (INDEPENDENT_AMBULATORY_CARE_PROVIDER_SITE_OTHER): Payer: Medicare Other | Admitting: Family Medicine

## 2020-07-06 VITALS — BP 118/70 | HR 75 | Temp 97.7°F | Ht 72.0 in | Wt 198.5 lb

## 2020-07-06 DIAGNOSIS — E785 Hyperlipidemia, unspecified: Secondary | ICD-10-CM

## 2020-07-06 DIAGNOSIS — M06042 Rheumatoid arthritis without rheumatoid factor, left hand: Secondary | ICD-10-CM | POA: Diagnosis not present

## 2020-07-06 DIAGNOSIS — M06041 Rheumatoid arthritis without rheumatoid factor, right hand: Secondary | ICD-10-CM | POA: Diagnosis not present

## 2020-07-06 DIAGNOSIS — N529 Male erectile dysfunction, unspecified: Secondary | ICD-10-CM

## 2020-07-06 DIAGNOSIS — I10 Essential (primary) hypertension: Secondary | ICD-10-CM | POA: Diagnosis not present

## 2020-07-06 DIAGNOSIS — R351 Nocturia: Secondary | ICD-10-CM | POA: Diagnosis not present

## 2020-07-06 DIAGNOSIS — M79672 Pain in left foot: Secondary | ICD-10-CM | POA: Diagnosis not present

## 2020-07-06 DIAGNOSIS — Z125 Encounter for screening for malignant neoplasm of prostate: Secondary | ICD-10-CM | POA: Diagnosis not present

## 2020-07-06 DIAGNOSIS — R739 Hyperglycemia, unspecified: Secondary | ICD-10-CM | POA: Diagnosis not present

## 2020-07-06 DIAGNOSIS — L570 Actinic keratosis: Secondary | ICD-10-CM | POA: Diagnosis not present

## 2020-07-06 DIAGNOSIS — I480 Paroxysmal atrial fibrillation: Secondary | ICD-10-CM | POA: Diagnosis not present

## 2020-07-06 MED ORDER — VERAPAMIL HCL ER 180 MG PO TBCR: 180.0000 mg | EXTENDED_RELEASE_TABLET | Freq: Every day | ORAL | 3 refills | Status: DC

## 2020-07-06 NOTE — Assessment & Plan Note (Signed)
At goal.  Continue verapamil 180 mg daily.

## 2020-07-06 NOTE — Assessment & Plan Note (Signed)
He is on methotrexate 10 mg weekly.  Recently had CBC and CMP which were normal.  He will continue GI protection with Prilosec 20 mg daily and folic acid supplementation.

## 2020-07-06 NOTE — Assessment & Plan Note (Signed)
Check A1c. 

## 2020-07-06 NOTE — Patient Instructions (Signed)
It was very nice to see you today!  We froze the spots on your face today.  We will check blood work today.  I will see back in year for your next checkup.  Please come back to see me sooner if needed.  Take care, Dr [name]  Please try these tips to maintain a healthy lifestyle:   Eat at least 3 REAL meals and 1-2 snacks per day.  Aim for no more than 5 hours between eating.  If you eat breakfast, please do so within one hour of getting up.    Each meal should contain half fruits/vegetables, one quarter protein, and one quarter carbs (no bigger than a computer mouse)   Cut down on sweet beverages. This includes juice, soda, and sweet tea.     Drink at least 1 glass of water with each meal and aim for at least 8 glasses per day   Exercise at least 150 minutes every week.

## 2020-07-06 NOTE — Progress Notes (Addendum)
   Max [name] is a 69 y.o. male who presents today for an office visit.  Assessment/Plan:  Chronic Problems Addressed Today: Rheumatoid arthritis (Max [name]) He is on methotrexate 10 mg weekly.  Recently had CBC and CMP which were normal.  He will continue GI protection with Prilosec 20 mg daily and folic acid supplementation.  ERECTILE DYSFUNCTION, ORGANIC Stable.  He can continue using sildenafil as needed.  Essential hypertension At goal.  Continue verapamil 180 mg daily.  Actinic keratosis Cryotherapy applied today.  See below procedure note.  Hyperglycemia Check A1c.  Dyslipidemia Check lipid panel.  Paroxysmal atrial fibrillation (HCC) Rate controlled.  CHA2DS2-VASc score 2.  Discussed 2.2% risk of stroke.  He is on aspirin 81 mg daily.  Declines anticoagulation at this point.  Preventative Healthcare Encourage patient to get shingles vaccine.  Will check PSA.  Up-to-date on other vaccines and screenings.    Subjective:  HPI:  Patient is here for annual checkup.  Would like blood work done today.  He is doing well.  See A/P for status of chronic problems.       Objective:  Physical Exam: BP 118/70   Pulse 75   Temp 97.7 F (36.5 C)   Ht 6' (1.829 m)   Wt 198 lb 8 oz (90 kg)   SpO2 96%   BMI 26.92 kg/m   Gen: No acute distress, resting comfortably CV: Irregular with no murmurs appreciated Pulm: Normal work of breathing, clear to auscultation bilaterally with no crackles, wheezes, or rhonchi Skin: Several scattered actinic keratoses on scalp and face. Neuro: Grossly normal, moves all extremities Psych: Normal affect and thought content  Cryotherapy Procedure Note  Pre-operative Diagnosis: Actinic keratosis  Locations: Face  Indications: Therapeutic  Procedure Details  Patient informed of risks (permanent scarring, infection, light or dark discoloration, bleeding, infection, weakness, numbness and recurrence of the lesion) and benefits of the  procedure and verbal informed consent obtained.  The areas are treated with liquid nitrogen therapy, frozen until ice ball extended 2 mm beyond lesion and allowed to thaw. The patient tolerated procedure well.  A total of 5 lesions were treated.The patient was instructed on post-op care, warned that there may be blister formation, redness and pain. Recommend OTC analgesia as needed for pain.  Condition: Stable  Complications: none.  Time Spent: 43 minutes of total time was spent on the date of the encounter performing the following actions: chart review prior to seeing the patient, obtaining history, performing a medically necessary exam, counseling on the treatment plan, placing orders for PSA, lipid panel, and A1c, and documenting in our EHR. This time is independent of the time spent performing the above procedure.      Algis Greenhouse. [name], MD 07/06/2020 9:24 AM

## 2020-07-06 NOTE — Assessment & Plan Note (Signed)
Cryotherapy applied today.  See below procedure note. 

## 2020-07-06 NOTE — Assessment & Plan Note (Signed)
Rate controlled.  CHA2DS2-VASc score 2.  Discussed 2.2% risk of stroke.  He is on aspirin 81 mg daily.  Declines anticoagulation at this point.

## 2020-07-06 NOTE — Assessment & Plan Note (Signed)
Stable.  He can continue using sildenafil as needed.

## 2020-07-06 NOTE — Assessment & Plan Note (Signed)
Check lipid panel  

## 2020-07-07 LAB — LIPID PANEL
Cholesterol: 177 mg/dL (ref <200)
HDL: 64 mg/dL (ref >=40)
LDL Cholesterol (Calc): 100 mg/dL (calc) — ABNORMAL HIGH
Non-HDL Cholesterol (Calc): 113 mg/dL (calc) (ref <130)
Total CHOL/HDL Ratio: 2.8 (calc) (ref <5.0)
Triglycerides: 50 mg/dL (ref <150)

## 2020-07-07 LAB — PSA: PSA: 0.9 ng/mL (ref <=4.0)

## 2020-07-07 LAB — HEMOGLOBIN A1C
Hgb A1c MFr Bld: 5.6 % of total Hgb (ref <5.7)
Mean Plasma Glucose: 114 (calc)
eAG (mmol/L): 6.3 (calc)

## 2020-07-09 NOTE — Progress Notes (Signed)
Please inform patient of the following:  Blood work is all STABLE. Do not need to make any changes to his treatment plan at this time. Would like for him to keep up the good work and we can recheck in a year or so.  Max [name]. [name], MD 07/09/2020 8:43 AM

## 2020-07-16 ENCOUNTER — Ambulatory Visit (INDEPENDENT_AMBULATORY_CARE_PROVIDER_SITE_OTHER): Payer: Medicare Other | Admitting: Podiatry

## 2020-07-16 ENCOUNTER — Other Ambulatory Visit: Payer: Self-pay

## 2020-07-16 DIAGNOSIS — M7752 Other enthesopathy of left foot: Secondary | ICD-10-CM | POA: Diagnosis not present

## 2020-07-16 DIAGNOSIS — M216X2 Other acquired deformities of left foot: Secondary | ICD-10-CM | POA: Diagnosis not present

## 2020-07-16 DIAGNOSIS — M7989 Other specified soft tissue disorders: Secondary | ICD-10-CM

## 2020-07-16 MED ORDER — DICLOFENAC SODIUM 1 % EX GEL: 2.0000 g | Freq: Four times a day (QID) | CUTANEOUS | 2 refills | Status: DC

## 2020-07-18 ENCOUNTER — Ambulatory Visit: Payer: BLUE CROSS/BLUE SHIELD | Admitting: Podiatry

## 2020-07-19 NOTE — Progress Notes (Signed)
Subjective:   Patient ID: Max [name], male   DOB: 69 y.o.   MRN: [id number]   HPI 69 year old male presents the office today for concerns of pain to the ball of his left foot pointing to submetatarsal 5 which has been ongoing for about 2 years and the pain is consistent.  He gets a sharp pain at times and mostly with pressure.  He has tried padding without significant improvement.  No recent injury or trauma.  No other concerns today.  Review of Systems  All other systems reviewed and are negative.  Past Medical History:  Diagnosis Date  . Abdominal hernia 11/28/2015  . Allergic rhinitis   . Anxiety   . BPV (benign positional vertigo), right 05/11/2018  . Cardiac arrhythmia   . ED (erectile dysfunction)   . Herpes   . History of syncope    in the setting of anxiety per cardiology note  . Hypertension   . Kidney tumor (benign), right    sx 02/27/2015  . Meningioma ([place])    had surgery   . Paroxysmal atrial fibrillation ([facility]) 07/15/2012   a) CHADSVASc [name] 1b) On full-dose ASA and Verapamil  . RA (rheumatoid arthritis) ([place])   . Seizures ([place]) 05/14/2011   "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   . Vasovagal syncope     Past Surgical History:  Procedure Laterality Date  . BRAIN MENINGIOMA EXCISION  2012  . CATARACT EXTRACTION W/ INTRAOCULAR LENS IMPLANT  ~ 2001   right  . COLONOSCOPY  2008  . EYE SURGERY  1964   right; "hit w/baseball; eye hemorrhaged"  . FLEXIBLE SIGMOIDOSCOPY    . REFRACTIVE SURGERY  ~ 2003  . ROBOTIC ASSITED PARTIAL NEPHRECTOMY Right 02/29/2016   Procedure: XI ROBOTIC ASSITED PARTIAL NEPHRECTOMY;  Surgeon: Alexis Frock, MD;  Location: [facility];  Service: Urology;  Laterality: Right;   NEEDS ULTRASOUND     Current Outpatient Medications:  .  acyclovir (ZOVIRAX) 800 MG tablet, Take 1 tablet (800 mg total) by mouth 3 (three) times daily as needed. For outbreaks, Disp: 60 tablet, Rfl: 1 .  Ascorbic Acid (VITAMIN C PO), Take 1 tablet by mouth  daily., Disp: , Rfl:  .  aspirin 81 MG chewable tablet, Chew by mouth daily., Disp: , Rfl:  .  folic acid (FOLVITE) 409 MCG tablet, Take 400 mcg by mouth daily., Disp: , Rfl:  .  methotrexate (RHEUMATREX) 2.5 MG tablet, TAKE 4 TABLETS BY MOUTH WEEKLY, Disp: 90 tablet, Rfl: 3 .  Multiple Vitamin (MULTIVITAMIN) tablet, Take 1 tablet by mouth daily., Disp: , Rfl:  .  omeprazole (PRILOSEC) 20 MG capsule, TAKE 1 CAPSULE BY MOUTH EVERY DAY, Disp: 90 capsule, Rfl: 3 .  sildenafil (REVATIO) 20 MG tablet, Take 1 to 2 tabs 2 - 3 hours before sex, Disp: 20 tablet, Rfl: 11 .  verapamil (CALAN-SR) 180 MG CR tablet, Take 1 tablet (180 mg total) by mouth daily., Disp: 90 tablet, Rfl: 3 .  diclofenac Sodium (VOLTAREN) 1 % GEL, Apply 2 g topically 4 (four) times daily. Rub into affected area of foot 2 to 4 times daily, Disp: 100 g, Rfl: 2  Allergies  Allergen Reactions  . Vicodin [Hydrocodone-Acetaminophen] Nausea And Vomiting         Objective:  Physical Exam  General: AAO x3, NAD  Dermatological: Skin is warm, dry and supple bilateral.There are no open sores, no preulcerative lesions, no rash or signs of infection present.  Vascular: Dorsalis Pedis artery and Posterior  Tibial artery pedal pulses are 2/4 bilateral with immedate capillary fill time.There is no pain with calf compression, swelling, warmth, erythema.   Neruologic: Grossly intact via light touch bilateral.    Musculoskeletal: Tenderness on the left foot submetatarsal 5 and a palpable is mobile soft tissue mass is present consistent with a bursitis.  This is where the majority tenderness is localized.  Tailor's bunion is evident.  No other areas of discomfort identified.  Muscular strength 5/5 in all groups tested bilateral.  Gait: Unassisted, Nonantalgic.       Assessment:   69 year old male metatarsalgia, soft tissue mass left foot     Plan:  -Treatment options discussed including all alternatives, risks, and  complications -Etiology of symptoms were discussed -Unable to get x-rays today as the system was down- get x-rays next appointment -Steroid injection performed.  Skin was cleaned with alcohol and a mixture of half cc Marcaine plain, half cc dexamethasone phosphate was infiltrated into the soft tissue mass without complications.  Postinjection care discussed.  Tolerated well. -Metatarsal offloading pads dispensed  Trula Slade DPM

## 2020-09-03 ENCOUNTER — Ambulatory Visit (INDEPENDENT_AMBULATORY_CARE_PROVIDER_SITE_OTHER): Payer: Medicare Other | Admitting: Podiatry

## 2020-09-03 ENCOUNTER — Ambulatory Visit (INDEPENDENT_AMBULATORY_CARE_PROVIDER_SITE_OTHER): Payer: Medicare Other

## 2020-09-03 ENCOUNTER — Other Ambulatory Visit: Payer: Self-pay

## 2020-09-03 DIAGNOSIS — M216X2 Other acquired deformities of left foot: Secondary | ICD-10-CM

## 2020-09-03 DIAGNOSIS — M216X1 Other acquired deformities of right foot: Secondary | ICD-10-CM

## 2020-09-03 DIAGNOSIS — M7989 Other specified soft tissue disorders: Secondary | ICD-10-CM

## 2020-09-03 NOTE — Progress Notes (Signed)
Orthotic note:  Offload 5th left, hug arch, deep heel seat. Dr. Reece Agar to drop in charges

## 2020-09-06 NOTE — Progress Notes (Signed)
Subjective: 69 year old male presents the office for follow evaluation of left foot pain.  He still gets some tenderness pointing submetatarsal 5.  He also has a history of rheumatoid arthritis.  Injection was helpful last appointment however still gets some discomfort.  No recent injury.  No open sores or any redness or swelling. Denies any systemic complaints such as fevers, chills, nausea, vomiting. No acute changes since last appointment, and no other complaints at this time.   Objective: AAO x3, NAD DP/PT pulses palpable bilaterally, CRT less than 3 seconds There is mild tenderness still present left foot submetatarsal 5 and small bursa, probable nodule is present to this area.  There is no edema, erythema.  No fluctuation crepitation.  No open lesions.  Prominence of metatarsal head plantarly.  No pain with calf compression, swelling, warmth, erythema  Assessment: 69 year old male likely bursa left foot submetatarsal 5  Plan: -All treatment options discussed with the patient including all alternatives, risks, complications.  -X-ray data reviewed.  For the chronic remodeling to present to the fifth metatarsal head consistent with inflammatory arthritis.  I doubt osteomyelitis given there is no signs of infection or open lesions. -Held off on steroid injections today.  Discussed offloading.  I had him follow-up with Liliane Channel for orthotics to help offload the fifth metatarsal head.  Trula Slade DPM

## 2020-09-13 ENCOUNTER — Other Ambulatory Visit: Payer: Medicare Other | Admitting: Orthotics

## 2020-09-13 ENCOUNTER — Telehealth: Payer: Self-pay | Admitting: Family Medicine

## 2020-09-13 NOTE — Telephone Encounter (Signed)
Left message for patient to call back and schedule Medicare Annual Wellness Visit (AWV) either virtually/audio only OR in office. Whatever the patients preference is.  Last AWV 08/19/19; please schedule at anytime with [facility]Nurse Health Advisor at [facility].  This should be a 45 minute visit.

## 2020-10-01 ENCOUNTER — Encounter: Payer: Medicare Other | Admitting: Orthotics

## 2020-10-09 ENCOUNTER — Ambulatory Visit: Payer: Medicare Other | Admitting: Orthotics

## 2020-10-09 ENCOUNTER — Other Ambulatory Visit: Payer: Self-pay

## 2020-10-09 DIAGNOSIS — M7989 Other specified soft tissue disorders: Secondary | ICD-10-CM

## 2020-10-09 DIAGNOSIS — M216X2 Other acquired deformities of left foot: Secondary | ICD-10-CM

## 2020-10-09 NOTE — Progress Notes (Signed)
Patient p/u f/o.. no issues. Max [name]

## 2020-11-03 DIAGNOSIS — Z23 Encounter for immunization: Secondary | ICD-10-CM | POA: Diagnosis not present

## 2020-11-12 ENCOUNTER — Other Ambulatory Visit: Payer: Self-pay

## 2020-11-12 ENCOUNTER — Ambulatory Visit (INDEPENDENT_AMBULATORY_CARE_PROVIDER_SITE_OTHER): Payer: Medicare Other

## 2020-11-12 DIAGNOSIS — Z0000 Encounter for general adult medical examination without abnormal findings: Secondary | ICD-10-CM | POA: Diagnosis not present

## 2020-11-12 NOTE — Progress Notes (Addendum)
Virtual Visit via Telephone Note  I connected with  Max [name] on 11/12/20 at  8:00 AM EST by telephone and verified that I am speaking with the correct person using two identifiers.  Medicare Annual Wellness visit completed telephonically due to Covid-19 pandemic.   Persons participating in this call: This Health Coach and this patient and wife Max [name]  Location: Patient: Home  Provider: Office   I discussed the limitations, risks, security and privacy concerns of performing an evaluation and management service by telephone and the availability of in person appointments. The patient expressed understanding and agreed to proceed.  Unable to perform video visit due to video visit attempted and failed and/or patient does not have video capability.   Some vital signs may be absent or patient reported.   Max Brace, LPN    Subjective:   Max [name] is a 69 y.o. male who presents for Medicare Annual/Subsequent preventive examination.  Review of Systems     Cardiac Risk Factors include: advanced age (>38men, >78 women);hypertension;dyslipidemia;male gender     Objective:    There were no vitals filed for this visit. There is no height or weight on file to calculate BMI.  Advanced Directives 11/12/2020 12/12/2019 12/11/2019 08/19/2019 02/29/2016 02/26/2016 02/04/2014  Does Patient Have a Medical Advance Directive? Yes No No Yes No No Patient does not have advance directive  Type of Scientist, forensic Power of [place];Living will - - Living will;Healthcare Power of Attorney - - -  Does patient want to make changes to medical advance directive? - - - No - Patient declined - - -  Copy of [place] in Chart? No - copy requested - - No - copy requested - - -  Would patient like information on creating a medical advance directive? - No - Patient declined - - - - -  Pre-existing out of facility DNR order (yellow form or pink MOST form) - - - - - -  No    Current Medications (verified) Outpatient Encounter Medications as of 11/12/2020  Medication Sig   acyclovir (ZOVIRAX) 800 MG tablet Take 1 tablet (800 mg total) by mouth 3 (three) times daily as needed. For outbreaks   Ascorbic Acid (VITAMIN C PO) Take 1 tablet by mouth daily.   aspirin 81 MG chewable tablet Chew by mouth daily.   folic acid (FOLVITE) 505 MCG tablet Take 400 mcg by mouth daily.   methotrexate (RHEUMATREX) 2.5 MG tablet TAKE 4 TABLETS BY MOUTH WEEKLY   Multiple Vitamin (MULTIVITAMIN) tablet Take 1 tablet by mouth daily.   omeprazole (PRILOSEC) 20 MG capsule TAKE 1 CAPSULE BY MOUTH EVERY DAY   sildenafil (REVATIO) 20 MG tablet Take 1 to 2 tabs 2 - 3 hours before sex   verapamil (CALAN-SR) 180 MG CR tablet Take 1 tablet (180 mg total) by mouth daily.   diclofenac Sodium (VOLTAREN) 1 % GEL Apply 2 g topically 4 (four) times daily. Rub into affected area of foot 2 to 4 times daily (Patient not taking: Reported on 11/12/2020)   No facility-administered encounter medications on file as of 11/12/2020.    Allergies (verified) Vicodin [hydrocodone-acetaminophen]   History: Past Medical History:  Diagnosis Date   Abdominal hernia 11/28/2015   Allergic rhinitis    Anxiety    BPV (benign positional vertigo), right 05/11/2018   Cardiac arrhythmia    ED (erectile dysfunction)    Herpes    History of syncope    in the setting  of anxiety per cardiology note   Hypertension    Kidney tumor (benign), right    sx 02/27/2015   Meningioma [facility])    had surgery    Paroxysmal atrial fibrillation ([place]) 07/15/2012   a) CHADSVASc score 1b) On full-dose ASA and Verapamil   RA (rheumatoid arthritis) ([place])    Seizures ([place]) 05/14/2011   "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    Vasovagal syncope    Past Surgical History:  Procedure Laterality Date   BRAIN MENINGIOMA EXCISION  2012   CATARACT EXTRACTION W/ INTRAOCULAR LENS IMPLANT  ~ 2001   right     COLONOSCOPY  2008   [place]   right; "hit w/baseball; eye hemorrhaged"   [place]  ~ 2003   ROBOTIC ASSITED PARTIAL NEPHRECTOMY Right 02/29/2016   Procedure: XI ROBOTIC ASSITED PARTIAL NEPHRECTOMY;  Surgeon: Alexis Frock, MD;  Location: [facility];  Service: Urology;  Laterality: Right;   NEEDS ULTRASOUND   Family History  Problem Relation Age of Onset   Depression Other    Hypertension Other    GI problems Other    Lung disease Other    Prostate cancer Brother    Prostate cancer Paternal Grandfather    Colon cancer Neg Hx    Esophageal cancer Neg Hx    Ulcerative colitis Neg Hx    Social History   Socioeconomic History   Marital status: Married    Spouse name: Not on file   Number of children: 1   Years of education: Not on file   Highest education level: Not on file  Occupational History    Comment: [employer]   Tobacco Use   Smoking status: Never Smoker   Smokeless tobacco: Never Used  Vaping Use   Vaping Use: Never used  Substance and Sexual Activity   Alcohol use: No   Drug use: No   Sexual activity: Yes  Other Topics Concern   Not on file  Social History Narrative   Regular Exercise   Social Determinants of Health   Financial Resource Strain: Low Risk    Difficulty of Paying Living Expenses: Not hard at all  Food Insecurity: No Food Insecurity   Worried About Charity fundraiser in the Last Year: Never true   Ran Out of Food in the Last Year: Never true  Transportation Needs: No Transportation Needs   Lack of Transportation (Medical): No   Lack of Transportation (Non-Medical): No  Physical Activity: Inactive   Days of Exercise per Week: 0 days   Minutes of Exercise per Session: 0 min  Stress: No Stress Concern Present   Feeling of Stress : Not at all  Social Connections: Moderately Isolated   Frequency of Communication with Friends and Family: Never   Frequency  of Social Gatherings with Friends and Family: More than three times a week   Attends Religious Services: Never   Marine scientist or Organizations: No   Attends Music therapist: Never   Marital Status: Married    Tobacco Counseling Counseling given: Not Answered   Clinical Intake:  Pre-visit preparation completed: Yes  Pain : No/denies pain     BMI - recorded: 26.92 Nutritional Status: BMI 25 -29 Overweight Nutritional Risks: None Diabetes: No  How often do you need to have someone help you when you read instructions, pamphlets, or other written materials from your doctor or pharmacy?: 1 - Never  Diabetic?No  Interpreter Needed?: No  Information entered by :: Charlott Rakes, LPN   Activities of Daily Living In your present state of health, do you have any difficulty performing the following activities: 11/12/2020 03/14/2020  Hearing? N N  Vision? N N  Difficulty concentrating or making decisions? N N  Walking or climbing stairs? N N  Comment - -  Dressing or bathing? N N  Doing errands, shopping? N N  Preparing Food and eating ? N -  Using the Toilet? N -  In the past six months, have you accidently leaked urine? N -  Do you have problems with loss of bowel control? N -  Managing your Medications? N -  Managing your Finances? N -  Housekeeping or managing your Housekeeping? N -  Some recent data might be hidden    Patient Care Team: Vivi Barrack, MD as PCP - General (Family Medicine) Leeroy Cha, MD as Consulting Physician (Neurosurgery) Marshell Garfinkel, MD as Consulting Physician (Pulmonary Disease) Hortencia Pilar, MD as Consulting Physician (Ophthalmology)  Indicate any recent Medical Services you may have received from other than [facility] providers in the past year (date may be approximate).     Assessment:   This is a routine wellness examination for [place].  Hearing/Vision screen  Hearing Screening   125Hz  250Hz   500Hz  1000Hz  2000Hz  3000Hz  4000Hz  6000Hz  8000Hz   Right ear:           Left ear:           Comments: Pt denies any hearing difficulty  Vision Screening Comments: Pt follows Dr Gerald Stabs [facility] at [facility]  Dietary issues and exercise activities discussed: Current Exercise Habits: The patient does not participate in regular exercise at present;The patient has a physically strenuous job, but has no regular exercise apart from work.  Goals     Patient Stated     Lose 15lbs       Depression Screen PHQ 2/9 Scores 11/12/2020 07/06/2020 08/19/2019 01/28/2019 05/11/2018 03/30/2017 11/28/2015  PHQ - 2 Score 0 0 0 0 0 0 0  PHQ- 9 Score - 0 - - - - -    Fall Risk Fall Risk  11/12/2020 03/14/2020 08/19/2019 05/11/2018 03/30/2017  Falls in the past year? 0 0 0 No No  Number falls in past yr: 0 0 0 - -  Injury with Fall? 0 0 0 - -  Risk for fall due to : Impaired vision - - - -  Follow up Falls prevention discussed - Education provided - -    Any stairs in or around the home? Yes  If so, are there any without handrails? No  Home free of loose throw rugs in walkways, pet beds, electrical cords, etc? Yes  Adequate lighting in your home to reduce risk of falls? Yes   ASSISTIVE DEVICES UTILIZED TO PREVENT FALLS:  Life alert? No  Use of a cane, walker or w/c? No  Grab bars in the bathroom? No  Shower chair or bench in shower? Yes  Elevated toilet seat or a handicapped toilet? Yes   TIMED UP AND GO:  Was the test performed? No .     Cognitive Function:     6CIT Screen 11/12/2020 08/19/2019  What Year? 0 points 0 points  What month? 0 points 0 points  What time? - 0 points  Count back from 20 0 points 0 points  Months in reverse 0 points 0 points  Repeat phrase 0 points 0 points  Total  Score - 0    Immunizations Immunization History  Administered Date(s) Administered   Fluad Quad(high Dose 65+) 11/03/2020   Influenza Split 12/18/2011   Influenza Whole 10/18/2009, 10/22/2010     Influenza, High Dose Seasonal PF 11/28/2015   Influenza, Seasonal, Injecte, Preservative Fre 12/20/2012   Influenza,inj,Quad PF,6+ Mos 12/21/2013, 02/08/2015   Influenza-Unspecified 10/10/2018   PFIZER SARS-COV-2 Vaccination 02/05/2020, 02/28/2020, 11/03/2020   Pneumococcal Conjugate-13 03/30/2017   Pneumococcal Polysaccharide-23 03/01/2016   Td 12/15/1998, 10/18/2009    TDAP status: Due, Education has been provided regarding the importance of this vaccine. Advised may receive this vaccine at local pharmacy or Health Dept. Aware to provide a copy of the vaccination record if obtained from local pharmacy or Health Dept. Verbalized acceptance and understanding.  Pt stated not recommended per provider  Flu Vaccine status: Up to date  Done 11/03/20  Pneumococcal vaccine status: Up to date Covid-19 vaccine status: Completed vaccines  Qualifies for Shingles Vaccine? Yes   Zostavax completed No   Shingrix Completed?: No.    Education has been provided regarding the importance of this vaccine. Patient has been advised to call insurance company to determine out of pocket expense if they have not yet received this vaccine. Advised may also receive vaccine at local pharmacy or Health Dept. Verbalized acceptance and understanding.  Screening Tests Health Maintenance  Topic Date Due   COLONOSCOPY  04/16/2028   INFLUENZA VACCINE  Completed   COVID-19 Vaccine  Completed   Hepatitis C Screening  Completed   PNA vac Low Risk Adult  Completed   TETANUS/TDAP  Discontinued    Health Maintenance  There are no preventive care reminders to display for this patient.  Colorectal cancer screening: Completed 04/16/18. Repeat every 10 years  Lung Cancer Screening: (Low Dose CT Chest recommended if Age 63-80 years, 30 pack-year currently smoking OR  Additional Screening:  Hepatitis C Screening: Completed 06/20/19  Vision Screening: Recommended annual ophthalmology exams for early  detection of glaucoma and other disorders of the eye. Is the patient up to date with their annual eye exam?  Yes  Who is the provider or what is the name of the office in which the patient attends annual eye exams? Dr Quentin Ore @ [facility]   Dental Screening: Recommended annual dental exams for proper oral hygiene  Community Resource Referral / Chronic Care Management: CRR required this visit?  No   CCM required this visit?  No      Plan:     I have personally reviewed and noted the following in the patients chart:    Medical and social history  Use of alcohol, tobacco or illicit drugs   Current medications and supplements  Functional ability and status  Nutritional status  Physical activity  Advanced directives  List of other physicians  Hospitalizations, surgeries, and ER visits in previous 12 months  Vitals  Screenings to include cognitive, depression, and falls  Referrals and appointments  In addition, I have reviewed and discussed with patient certain preventive protocols, quality metrics, and best practice recommendations. A written personalized care plan for preventive services as well as general preventive health recommendations were provided to patient.     Max Brace, LPN   [id number]   Nurse Notes: None

## 2020-11-12 NOTE — Patient Instructions (Addendum)
Max [name] , Thank you for taking time to come for your Medicare Wellness Visit. I appreciate your ongoing commitment to your health goals. Please review the following plan we discussed and let me know if I can assist you in the future.   Screening recommendations/referrals: Colonoscopy: Done 04/16/18 Recommended yearly ophthalmology/optometry visit for glaucoma screening and checkup Recommended yearly dental visit for hygiene and checkup  Vaccinations: Influenza vaccine: Up to date Done 11/03/20 Pneumococcal vaccine: Up to date Tdap vaccine: Not Recommended pt stated per provider Shingles vaccine: Shingrix discussed. Please contact your pharmacy for coverage information.  Covid-19: Completed 2/21, 3/16, & 11/03/20  Advanced directives: Please bring a copy of your health care power of attorney and living will to the office at your convenience.  Conditions/risks identified: Lose 15lbs   Next appointment: Follow up in one year for your annual wellness visit.   Preventive Care 70 Years and Older, Male Preventive care refers to lifestyle choices and visits with your health care provider that can promote health and wellness. What does preventive care include?  A yearly physical exam. This is also called an annual well check.  Dental exams once or twice a year.  Routine eye exams. Ask your health care provider how often you should have your eyes checked.  Personal lifestyle choices, including:  Daily care of your teeth and gums.  Regular physical activity.  Eating a healthy diet.  Avoiding tobacco and drug use.  Limiting alcohol use.  Practicing safe sex.  Taking low doses of aspirin every day.  Taking vitamin and mineral supplements as recommended by your health care provider. What happens during an annual well check? The services and screenings done by your health care provider during your annual well check will depend on your age, overall health, lifestyle risk factors,  and family history of disease. Counseling  Your health care provider may ask you questions about your:  Alcohol use.  Tobacco use.  Drug use.  Emotional well-being.  Home and relationship well-being.  Sexual activity.  Eating habits.  History of falls.  Memory and ability to understand (cognition).  Work and work Statistician. Screening  You may have the following tests or measurements:  Height, weight, and BMI.  Blood pressure.  Lipid and cholesterol levels. These may be checked every 5 years, or more frequently if you are over 64 years old.  Skin check.  Lung cancer screening. You may have this screening every year starting at age 26 if you have a 30-pack-year history of smoking and currently smoke or have quit within the past 15 years.  Fecal occult blood test (FOBT) of the stool. You may have this test every year starting at age 48.  Flexible sigmoidoscopy or colonoscopy. You may have a sigmoidoscopy every 5 years or a colonoscopy every 10 years starting at age 33.  Prostate cancer screening. Recommendations will vary depending on your family history and other risks.  Hepatitis C blood test.  Hepatitis B blood test.  Sexually transmitted disease (STD) testing.  Diabetes screening. This is done by checking your blood sugar (glucose) after you have not eaten for a while (fasting). You may have this done every 1-3 years.  Abdominal aortic aneurysm (AAA) screening. You may need this if you are a current or former smoker.  Osteoporosis. You may be screened starting at age 2 if you are at high risk. Talk with your health care provider about your test results, treatment options, and if necessary, the need for more tests.  Vaccines  Your health care provider may recommend certain vaccines, such as:  Influenza vaccine. This is recommended every year.  Tetanus, diphtheria, and acellular pertussis (Tdap, Td) vaccine. You may need a Td booster every 10  years.  Zoster vaccine. You may need this after age 62.  Pneumococcal 13-valent conjugate (PCV13) vaccine. One dose is recommended after age 37.  Pneumococcal polysaccharide (PPSV23) vaccine. One dose is recommended after age 51. Talk to your health care provider about which screenings and vaccines you need and how often you need them. This information is not intended to replace advice given to you by your health care provider. Make sure you discuss any questions you have with your health care provider. Document Released: 12/28/2015 Document Revised: 08/20/2016 Document Reviewed: 10/02/2015 Elsevier Interactive Patient Education  2017 [employer] Prevention in the Home Falls can cause injuries. They can happen to people of all ages. There are many things you can do to make your home safe and to help prevent falls. What can I do on the outside of my home?  Regularly fix the edges of walkways and driveways and fix any cracks.  Remove anything that might make you trip as you walk through a door, such as a raised step or threshold.  Trim any bushes or trees on the path to your home.  Use bright outdoor lighting.  Clear any walking paths of anything that might make someone trip, such as rocks or tools.  Regularly check to see if handrails are loose or broken. Make sure that both sides of any steps have handrails.  Any raised decks and porches should have guardrails on the edges.  Have any leaves, snow, or ice cleared regularly.  Use sand or salt on walking paths during winter.  Clean up any spills in your garage right away. This includes oil or grease spills. What can I do in the bathroom?  Use night lights.  Install grab bars by the toilet and in the tub and shower. Do not use towel bars as grab bars.  Use non-skid mats or decals in the tub or shower.  If you need to sit down in the shower, use a plastic, non-slip stool.  Keep the floor dry. Clean up any water that  spills on the floor as soon as it happens.  Remove soap buildup in the tub or shower regularly.  Attach bath mats securely with double-sided non-slip rug tape.  Do not have throw rugs and other things on the floor that can make you trip. What can I do in the bedroom?  Use night lights.  Make sure that you have a light by your bed that is easy to reach.  Do not use any sheets or blankets that are too big for your bed. They should not hang down onto the floor.  Have a firm chair that has side arms. You can use this for support while you get dressed.  Do not have throw rugs and other things on the floor that can make you trip. What can I do in the kitchen?  Clean up any spills right away.  Avoid walking on wet floors.  Keep items that you use a lot in easy-to-reach places.  If you need to reach something above you, use a strong step stool that has a grab bar.  Keep electrical cords out of the way.  Do not use floor polish or wax that makes floors slippery. If you must use wax, use non-skid floor wax.  Do not have throw rugs and other things on the floor that can make you trip. What can I do with my stairs?  Do not leave any items on the stairs.  Make sure that there are handrails on both sides of the stairs and use them. Fix handrails that are broken or loose. Make sure that handrails are as long as the stairways.  Check any carpeting to make sure that it is firmly attached to the stairs. Fix any carpet that is loose or worn.  Avoid having throw rugs at the top or bottom of the stairs. If you do have throw rugs, attach them to the floor with carpet tape.  Make sure that you have a light switch at the top of the stairs and the bottom of the stairs. If you do not have them, ask someone to add them for you. What else can I do to help prevent falls?  Wear shoes that:  Do not have high heels.  Have rubber bottoms.  Are comfortable and fit you well.  Are closed at the  toe. Do not wear sandals.  If you use a stepladder:  Make sure that it is fully opened. Do not climb a closed stepladder.  Make sure that both sides of the stepladder are locked into place.  Ask someone to hold it for you, if possible.  Clearly mark and make sure that you can see:  Any grab bars or handrails.  First and last steps.  Where the edge of each step is.  Use tools that help you move around (mobility aids) if they are needed. These include:  Canes.  Walkers.  Scooters.  Crutches.  Turn on the lights when you go into a dark area. Replace any light bulbs as soon as they burn out.  Set up your furniture so you have a clear path. Avoid moving your furniture around.  If any of your floors are uneven, fix them.  If there are any pets around you, be aware of where they are.  Review your medicines with your doctor. Some medicines can make you feel dizzy. This can increase your chance of falling. Ask your doctor what other things that you can do to help prevent falls. This information is not intended to replace advice given to you by your health care provider. Make sure you discuss any questions you have with your health care provider. Document Released: 09/27/2009 Document Revised: 05/08/2016 Document Reviewed: 01/05/2015 Elsevier Interactive Patient Education  2017 [employer].

## 2021-01-03 DIAGNOSIS — H524 Presbyopia: Secondary | ICD-10-CM | POA: Diagnosis not present

## 2021-01-03 DIAGNOSIS — H25012 Cortical age-related cataract, left eye: Secondary | ICD-10-CM | POA: Diagnosis not present

## 2021-01-03 DIAGNOSIS — H5319 Other subjective visual disturbances: Secondary | ICD-10-CM | POA: Diagnosis not present

## 2021-01-03 DIAGNOSIS — H35371 Puckering of macula, right eye: Secondary | ICD-10-CM | POA: Diagnosis not present

## 2021-01-03 DIAGNOSIS — H2512 Age-related nuclear cataract, left eye: Secondary | ICD-10-CM | POA: Diagnosis not present

## 2021-01-11 ENCOUNTER — Other Ambulatory Visit: Payer: Self-pay | Admitting: Family Medicine

## 2021-03-04 ENCOUNTER — Telehealth: Payer: Self-pay

## 2021-03-04 NOTE — Telephone Encounter (Signed)
Patients wife Max [name] states there medical health plan threw medicare ( [employer] ) states that methotrexate (RHEUMATREX) 2.5 MG tablet and verapamil (CALAN-SR) 180 MG CR tablet  are now a level 4 and patients wife filed an appeal to get them back to a level [employer] company should be reaching out via e-mail or fax  to get questions answered regarding medication can we pleased contact patient when information has been sent back to insurance

## 2021-03-05 NOTE — Telephone Encounter (Signed)
Notified patient wife verapamil was approved.methotrexate was denied. She voices understanding

## 2021-03-06 ENCOUNTER — Telehealth: Payer: Self-pay | Admitting: *Deleted

## 2021-03-06 NOTE — Telephone Encounter (Signed)
PA for Rx Methotrexate 2.5mg  and Rx verapamil ER 180 mg done on 03/05/2020  Rx Verapamil was approved  Rx Methotrexate denied

## 2021-03-07 ENCOUNTER — Other Ambulatory Visit: Payer: Self-pay | Admitting: *Deleted

## 2021-03-07 DIAGNOSIS — M06042 Rheumatoid arthritis without rheumatoid factor, left hand: Secondary | ICD-10-CM

## 2021-03-07 DIAGNOSIS — M06041 Rheumatoid arthritis without rheumatoid factor, right hand: Secondary | ICD-10-CM

## 2021-03-07 NOTE — Telephone Encounter (Signed)
Please let patient know his methotrexate was not approved. He can pay out of pocket if needed or we can refer to rheumatology to discuss alternatives if he is interested.  Algis Greenhouse. [name], MD 03/07/2021 10:09 AM

## 2021-03-07 NOTE — Telephone Encounter (Signed)
Patient aware of determination Rheumatology referral placed

## 2021-04-03 ENCOUNTER — Telehealth: Payer: Self-pay

## 2021-04-03 NOTE — Telephone Encounter (Signed)
.   LAST APPOINTMENT DATE: 03/06/2021   NEXT APPOINTMENT DATE:@Visit  date not found  MEDICATION:acyclovir (ZOVIRAX) 800 MG tablet  PHARMACY: [id number] - [place], [place] - [address]

## 2021-04-04 ENCOUNTER — Other Ambulatory Visit: Payer: Self-pay

## 2021-04-04 MED ORDER — ACYCLOVIR 800 MG PO TABS: 800.0000 mg | ORAL_TABLET | Freq: Three times a day (TID) | ORAL | 1 refills | Status: DC | PRN

## 2021-04-04 NOTE — Telephone Encounter (Signed)
Rx sent in

## 2021-04-23 ENCOUNTER — Other Ambulatory Visit: Payer: Self-pay | Admitting: Family Medicine

## 2021-04-26 NOTE — Progress Notes (Signed)
Office Visit Note  Patient: Max [name]             Date of Birth: Aug 05, 1951           MRN: [id number]             PCP: Vivi Barrack, MD Referring: Vivi Barrack, MD Visit Date: 05/06/2021 Occupation: @GUAROCC @  Subjective:  Medication management.   History of Present Illness: Max [name] is a 70 y.o. male with the history of seropositive rheumatoid arthritis.  He has been seen in consultation per request of his PCP.  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  He did not develop any swelling.  He was seen by his PCP, Dr. Sherren Mocha who did some blood work and diagnosed him with rheumatoid arthritis.  He placed him on methotrexate 4 tablets p.o. daily week along with folic acid and omeprazole.  He states he did extremely well on methotrexate.  He tried coming off methotrexate but he started having pain and stiffness in bilateral wrist joints again and restarted the medication.  He has been on methotrexate for the last 11 years.  None of the other joints are painful or were ever involved.  He denies any history of nodulosis.  He had some pulmonary nodules which were suspicious of rheumatoid nodules per patient.  Dr. Sherren Mocha retired and according to the patient methotrexate has become expensive  and he is looking for other treatment options.  He denies any pain and discomfort in any of his joints today.  There is no family history of rheumatoid arthritis.  Activities of Daily Living:  Patient reports morning stiffness for 0 minutes.   Patient Denies nocturnal pain.  Difficulty dressing/grooming: Denies Difficulty climbing stairs: Denies Difficulty getting out of chair: Denies Difficulty using hands for taps, buttons, cutlery, and/or writing: Denies  Review of Systems  Constitutional: Negative for fatigue.  HENT: Negative  for mouth sores, mouth dryness and nose dryness.   Eyes: Negative for pain, itching and dryness.  Respiratory: Negative for shortness of breath and difficulty breathing.   Cardiovascular: Negative for chest pain and palpitations.  Gastrointestinal: Positive for constipation. Negative for blood in stool and diarrhea.  Endocrine: Negative for increased urination.  Genitourinary: Negative for difficulty urinating.  Musculoskeletal: Negative for arthralgias, joint pain, joint swelling, myalgias, morning stiffness, muscle tenderness and myalgias.  Skin: Negative for color change, rash and redness.  Allergic/Immunologic: Negative for susceptible to infections.  Neurological: Negative for dizziness, numbness, headaches, memory loss and weakness.  Hematological: Positive for bruising/bleeding tendency.  Psychiatric/Behavioral: Negative for confusion.    PMFS History:  Patient Active Problem List   Diagnosis Date Noted  . Actinic keratosis 07/06/2020  . Dyslipidemia 06/21/2019  . Hyperglycemia 06/21/2019  . 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 Pulmonary nodule 01/28/2019  . History of cataract 01/28/2019  . Paroxysmal atrial fibrillation ([place]) 02/04/2014  . History of cold sores 07/14/2012  . Rheumatoid arthritis ([place]) 11/23/2009  . 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 Essential hypertension 08/26/2007    Past Medical History:  Diagnosis Date  . Abdominal hernia 11/28/2015  . Allergic rhinitis   . Anxiety   . BPV (benign positional vertigo), right 05/11/2018  . Cardiac arrhythmia   . ED (erectile dysfunction)   . Herpes   . History  of syncope    in the setting of anxiety per cardiology note  . Hypertension   . Kidney tumor (benign), right    sx 02/27/2015  . Meningioma ([place])    had surgery   . Paroxysmal atrial fibrillation (HCC) 07/15/2012   a) CHADSVASc score 1b) On full-dose ASA and Verapamil  . RA (rheumatoid arthritis) ([place])   . Seizures ([place]) 05/14/2011   "first and  only" secondary to meningioma   . Vasovagal syncope     Family History  Problem Relation Age of Onset  . Depression Other   . Hypertension Other   . GI problems Other   . Lung disease Other   . Hypertension Brother   . Prostate cancer Paternal Grandfather   . Healthy Son   . Colon cancer Neg Hx   . Esophageal cancer Neg Hx   . Ulcerative colitis Neg Hx    Past Surgical History:  Procedure Laterality Date  . BRAIN MENINGIOMA EXCISION  2012  . CATARACT EXTRACTION W/ INTRAOCULAR LENS IMPLANT  ~ 2001   right  . COLONOSCOPY  2008  . EYE SURGERY  1964   right; "hit w/baseball; eye hemorrhaged"  . FLEXIBLE SIGMOIDOSCOPY    . REFRACTIVE SURGERY  ~ 2003  . ROBOTIC ASSITED PARTIAL NEPHRECTOMY Right 02/29/2016   Procedure: XI ROBOTIC ASSITED PARTIAL NEPHRECTOMY;  Surgeon: Alexis Frock, MD;  Location: [facility];  Service: Urology;  Laterality: Right;   NEEDS ULTRASOUND   Social History   Social History Narrative   Regular Exercise   Immunization History  Administered Date(s) Administered  . Fluad Quad(high Dose 65+) 11/03/2020  . Influenza Split 12/18/2011  . Influenza Whole 10/18/2009, 10/22/2010  . Influenza, High Dose Seasonal PF 11/28/2015  . Influenza, Seasonal, Injecte, Preservative Fre 12/20/2012  . Influenza,inj,Quad PF,6+ Mos 12/21/2013, 02/08/2015  . Influenza-Unspecified 10/10/2018  . PFIZER(Purple Top)SARS-COV-2 Vaccination 02/05/2020, 02/28/2020, 11/03/2020  . Pneumococcal Conjugate-13 03/30/2017  . Pneumococcal Polysaccharide-23 03/01/2016  . Td 12/15/1998, 10/18/2009     Objective: Vital Signs: BP (!) 154/91 (BP Location: Right Arm, Patient Position: Sitting, Cuff Size: Normal)   Pulse (!) 103   Ht 6' (1.829 m)   Wt 210 lb (95.3 kg)   BMI 28.48 kg/m    Physical Exam Vitals and nursing note reviewed.  Constitutional:      Appearance: He is well-developed.  HENT:     Head: Normocephalic and atraumatic.  Eyes:     Conjunctiva/sclera: Conjunctivae normal.      Pupils: Pupils are equal, round, and reactive to light.  Cardiovascular:     Rate and Rhythm: Normal rate and regular rhythm.     Heart sounds: Normal heart sounds.     Comments: Irregularly irregular heart rate Pulmonary:     Effort: Pulmonary effort is normal.     Breath sounds: Normal breath sounds.  Abdominal:     General: Bowel sounds are normal.     Palpations: Abdomen is soft.  Musculoskeletal:     Cervical back: Normal range of motion and neck supple.  Skin:    General: Skin is warm and dry.     Capillary Refill: Capillary refill takes less than 2 seconds.  Neurological:     Mental Status: He is alert and oriented to person, place, and time.  Psychiatric:        Behavior: Behavior normal.      Musculoskeletal Exam: C-spine thoracic and lumbar spine with good range of motion.  Shoulder joints, elbow joints, wrist joints, MCPs  PIPs and DIPs with good range of motion.  He has some PIP and DIP thickening with no synovitis.  Hip joints, knee joints, ankles, MTPs and PIPs with good range of motion with no synovitis.  CDAI Exam: CDAI Score: 0  Patient Global: 0 mm; Provider Global: 0 mm Swollen: 0 ; Tender: 0  Joint Exam 05/06/2021   No joint exam has been documented for this visit   There is currently no information documented on the homunculus. Go to the Rheumatology activity and complete the homunculus joint exam.  Investigation: No additional findings.  Imaging: No results found.  Recent Labs: Lab Results  Component Value Date   WBC 5.8 05/04/2020   HGB 14.9 05/04/2020   PLT 386.0 05/04/2020   NA 138 05/04/2020   K 4.3 05/04/2020   CL 106 05/04/2020   CO2 28 05/04/2020   GLUCOSE 113 (H) 05/04/2020   BUN 18 05/04/2020   CREATININE 1.16 05/04/2020   BILITOT 0.6 05/04/2020   ALKPHOS 74 05/04/2020   AST 19 05/04/2020   ALT 23 05/04/2020   PROT 7.0 05/04/2020   ALBUMIN 4.6 05/04/2020   CALCIUM 9.5 05/04/2020   GFRAA >60 12/13/2019    Speciality  Comments: No specialty comments available.  Procedures:  No procedures performed Allergies: Vicodin [hydrocodone-acetaminophen]   Assessment / Plan:     Visit Diagnoses: Rheumatoid arthritis involving both hands with negative rheumatoid factor (HCC)-patient was diagnosed with rheumatoid arthritis by Dr. Sherren Mocha 11 years ago based on bilateral wrist joint pain and positive lab test per patient.  He has been on methotrexate for last 11 years.  He states initially he tried to come off methotrexate but he started having pain and discomfort in restarted the medication.  He has not had any discomfort in the last many years.  He wants to switch to other treatment as he states that methotrexate has become too expensive.  He denies any pain or discomfort in his joints.  I will obtain baseline x-rays of his bilateral hands and bilateral feet to look for any radiographic changes.  The x-rays of bilateral hands showed severe rheumatoid arthritis with MCP narrowing.  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  The x-rays of bilateral feet showed changes consistent with rheumatoid arthritis and erosive changes in bilateral fifth MTP joints.  No comparison films are available.  I will obtain labs today.  Indications side effects contraindications of methotrexate were discussed.  Handout was given and consent was taken.  High risk medication use - MTX 4 tablets weekly and folic acid 026 mcg daily  Essential hypertension-his blood pressure is elevated today.  He states he has been taking his blood pressure medications.  I have advised him to monitor his blood pressure closely.  Dyslipidemia-increased risk of heart disease with rheumatoid arthritis was discussed.  Instructions were placed in the AVS.  Paroxysmal atrial fibrillation (HCC) - on ECASA 81mg  qd.  His heart rate was irregularly irregular.  Patient is aware of that.  Pulmonary nodule - stable, followed by pulmonologist.  Actinic  keratosis  Defect of right cornea  History of cataract  Orders: Orders Placed This Encounter  Procedures  . XR Hand 2 View Right  . XR Hand 2 View Left  . XR Foot 2 Views Right  . XR Foot 2 Views Left  . CBC with Differential/Platelet  . COMPLETE METABOLIC PANEL WITH GFR  . Sedimentation rate  . Rheumatoid factor  . Cyclic citrul peptide antibody, IgG  .  Hepatitis B surface antigen  . Hepatitis B core antibody, IgM  . QuantiFERON-TB Gold Plus  . Serum protein electrophoresis with reflex  . IgG, IgA, IgM   No orders of the defined types were placed in this encounter.     Follow-Up Instructions: Return for Rheumatoid arthritis.   Bo Merino, MD  Note - This record has been created using Editor, commissioning.  Chart creation errors have been sought, but may not always  have been located. Such creation errors do not reflect on  the standard of medical care.

## 2021-05-06 ENCOUNTER — Other Ambulatory Visit: Payer: Self-pay

## 2021-05-06 ENCOUNTER — Telehealth: Payer: Medicare Other | Admitting: Pharmacist

## 2021-05-06 ENCOUNTER — Encounter: Payer: Self-pay | Admitting: Rheumatology

## 2021-05-06 ENCOUNTER — Ambulatory Visit (INDEPENDENT_AMBULATORY_CARE_PROVIDER_SITE_OTHER): Payer: Medicare Other | Admitting: Rheumatology

## 2021-05-06 ENCOUNTER — Ambulatory Visit: Payer: Self-pay

## 2021-05-06 ENCOUNTER — Other Ambulatory Visit (HOSPITAL_COMMUNITY): Payer: Self-pay

## 2021-05-06 VITALS — BP 154/91 | HR 103 | Ht 72.0 in | Wt 210.0 lb

## 2021-05-06 DIAGNOSIS — M06042 Rheumatoid arthritis without rheumatoid factor, left hand: Secondary | ICD-10-CM

## 2021-05-06 DIAGNOSIS — Z8669 Personal history of other diseases of the nervous system and sense organs: Secondary | ICD-10-CM

## 2021-05-06 DIAGNOSIS — Z79899 Other long term (current) drug therapy: Secondary | ICD-10-CM | POA: Diagnosis not present

## 2021-05-06 DIAGNOSIS — R911 Solitary pulmonary nodule: Secondary | ICD-10-CM

## 2021-05-06 DIAGNOSIS — M06041 Rheumatoid arthritis without rheumatoid factor, right hand: Secondary | ICD-10-CM

## 2021-05-06 DIAGNOSIS — M06071 Rheumatoid arthritis without rheumatoid factor, right ankle and foot: Secondary | ICD-10-CM | POA: Diagnosis not present

## 2021-05-06 DIAGNOSIS — L570 Actinic keratosis: Secondary | ICD-10-CM

## 2021-05-06 DIAGNOSIS — H18891 Other specified disorders of cornea, right eye: Secondary | ICD-10-CM

## 2021-05-06 DIAGNOSIS — I480 Paroxysmal atrial fibrillation: Secondary | ICD-10-CM

## 2021-05-06 DIAGNOSIS — E785 Hyperlipidemia, unspecified: Secondary | ICD-10-CM

## 2021-05-06 DIAGNOSIS — I10 Essential (primary) hypertension: Secondary | ICD-10-CM | POA: Diagnosis not present

## 2021-05-06 DIAGNOSIS — M06072 Rheumatoid arthritis without rheumatoid factor, left ankle and foot: Secondary | ICD-10-CM | POA: Diagnosis not present

## 2021-05-06 NOTE — Patient Instructions (Signed)
Vaccines You are taking a medication(s) that can suppress your immune system.  The following immunizations are recommended: . Flu annually . Covid-19  . Td/Tdap (tetatnus) every 10 years . Pneumonia (Prevnar 15 then Pneumovax 23 at least 1 year apart.  Alternatively, can take Prevnar 20 without needing additional dose) . Shingrix (after age 71): 2 doses from 4 weeks to 6 months apart  Please check with your PCP to make sure you are up to date.  Standing Labs We placed an order today for your standing lab work.   Please have your standing labs drawn in 3 months  If possible, please have your labs drawn 2 weeks prior to your appointment so that the provider can discuss your results at your appointment.  Please note that you may see your imaging and lab results in [place] before we have reviewed them. We may be awaiting multiple results to interpret others before contacting you. Please allow our office up to 72 hours to thoroughly review all of the results before contacting the office for clarification of your results.  We have open lab daily: Monday through Thursday from 1:30-4:30 PM and Friday from 1:30-4:00 PM at the office of Dr. Bo Merino, [facility].   Please be advised, all patients with office appointments requiring lab work will take precedent over walk-in lab work.  If possible, please come for your lab work on Monday and Friday afternoons, as you may experience shorter wait times. The office is located at [address], [place], [place], [place] [address] No appointment is necessary.   Labs are drawn by [facility]. Please bring your co-pay at the time of your lab draw.  You may receive a bill from [facility] for your lab work.  If you wish to have your labs drawn at another location, please call the office 24 hours in advance to send orders.  If you have any questions regarding directions or hours of operation,  please call [phone].   As a reminder, please  drink plenty of water prior to coming for your lab work. Thanks!

## 2021-05-06 NOTE — Progress Notes (Signed)
Pharmacy Note  Subjective: Patient presents today to [facility] for follow up office visit. Patient seen by the pharmacist for counseling on methotrexate for rheumatoid arthritis. He takes methotrexate 10mg  and has taken for 11 years.  Objective: CBC    Component Value Date/Time   WBC 5.8 05/04/2020 1354   RBC 4.39 05/04/2020 1354   HGB 14.9 05/04/2020 1354   HCT 43.9 05/04/2020 1354   PLT 386.0 05/04/2020 1354   MCV 100.0 05/04/2020 1354   MCH 32.9 12/13/2019 0400   MCHC 33.9 05/04/2020 1354   RDW 13.9 05/04/2020 1354   LYMPHSABS 1.3 12/13/2019 0400   MONOABS 0.8 12/13/2019 0400   EOSABS 0.0 12/13/2019 0400   BASOSABS 0.1 12/13/2019 0400    CMP     Component Value Date/Time   NA 138 05/04/2020 1354   K 4.3 05/04/2020 1354   CL 106 05/04/2020 1354   CO2 28 05/04/2020 1354   GLUCOSE 113 (H) 05/04/2020 1354   BUN 18 05/04/2020 1354   CREATININE 1.16 05/04/2020 1354   CALCIUM 9.5 05/04/2020 1354   PROT 7.0 05/04/2020 1354   ALBUMIN 4.6 05/04/2020 1354   AST 19 05/04/2020 1354   ALT 23 05/04/2020 1354   ALKPHOS 74 05/04/2020 1354   BILITOT 0.6 05/04/2020 1354   GFRNONAA >60 12/13/2019 0400   GFRAA >60 12/13/2019 0400    Baseline Immunosuppressant Therapy Labs TB GOLD   Hepatitis Panel Hepatitis Latest Ref Rng & Units 06/20/2019  Hep C Ab NON-REACTI NON-REACTIVE  Hep C Ab NON-REACTI NON-REACTIVE   HIV Lab Results  Component Value Date   HIV NON REACTIVE 12/12/2019   Immunoglobulins   SPEP Serum Protein Electrophoresis Latest Ref Rng & Units 05/04/2020  Total Protein 6.0 - 8.3 g/dL 7.0   G6PD No results found for: G6PDH TPMT No results found for: TPMT   Chest-xray:  12/08/2019 wnl  Alcohol use: does not smoke  Assessment/Plan:  Baseline immunosuppressive (except Hep C antibody and HIV) drawn today. Patient provided with [facility] locations to have labs drawn.  Patient was counseled on the purpose, proper use, and adverse effects of  methotrexate including nausea, infection, and signs and symptoms of pneumonitis. Discussed that there is the possibility of an increased risk of malignancy, specifically lymphomas, but it is not well understood if this increased risk is due to the medication or the disease state.  Instructed patient that medication should be held for infection and prior to surgery.  Advised patient to avoid live vaccines. Patient UTD on pneumonia and influenza vaccine. He has been advised to receive zoster vaccine.  Has received 3 COVID19 vaccines and is due for second booster dose and has been advised to receive second booster dose.  Reviewed instructions with patient to take methotrexate weekly along with folic acid daily.  Discussed the importance of frequent monitoring of kidney and liver function and blood counts, and provided patient with standing lab instructions.  Counseled patient to avoid NSAIDs and alcohol while on methotrexate.  Provided patient with educational materials on methotrexate and answered all questions.   Patient voiced understanding.  Patient consented to methotrexate use.  Will upload into chart.    Dose of methotrexate is currently 4 tabs (10mg ) weekly along with folic acid 8.2N daily.  Initial plan was to taper off of MTX but he will continue 4 tabs weekly based on foot x-rays per Dr. Estanislado Pandy  Left VM for patient to discuss that he should not change dose of MTX as this decision  was made shortly AFTER his appointment and reviewing his x-rays  Addendum: Spoke with patient's wife, Fraser Din, and communicated that NO MTX DOSE changes were made and he should continue 4 tabs weekly  Knox Saliva, PharmD, MPH Clinical Pharmacist (Rheumatology and Pulmonology)

## 2021-05-06 NOTE — Telephone Encounter (Signed)
Patient states his MTX tabs are expensive through Medicare. Can we try for tier exception request?  He currently fills at [place], PharmD, MPH Clinical Pharmacist (Rheumatology and Pulmonology)

## 2021-05-08 ENCOUNTER — Other Ambulatory Visit (HOSPITAL_COMMUNITY): Payer: Self-pay

## 2021-05-08 NOTE — Telephone Encounter (Signed)
Attempted to run test claim to see what patient's copay is but [facility] is out of network for patient's plan. Will try calling pharmacy once rx has been sent in.  Unable to initiate Tier exception via CMMs, will need to call plan.

## 2021-05-09 LAB — IGG, IGA, IGM
IgG (Immunoglobin G), Serum: 1034 mg/dL (ref 600–1540)
IgM, Serum: 87 mg/dL (ref 50–300)
Immunoglobulin A: 242 mg/dL (ref 70–320)

## 2021-05-09 LAB — COMPLETE METABOLIC PANEL WITH GFR
AG Ratio: 1.8 (calc) (ref 1.0–2.5)
ALT: 16 U/L (ref 9–46)
AST: 15 U/L (ref 10–35)
Albumin: 4.2 g/dL (ref 3.6–5.1)
Alkaline phosphatase (APISO): 60 U/L (ref 35–144)
BUN: 18 mg/dL (ref 7–25)
CO2: 29 mmol/L (ref 20–32)
Calcium: 9.6 mg/dL (ref 8.6–10.3)
Chloride: 107 mmol/L (ref 98–110)
Creat: 1.13 mg/dL (ref 0.70–1.18)
GFR, Est African American: 76 mL/min/{1.73_m2} (ref >=60)
GFR, Est Non African American: 65 mL/min/{1.73_m2} (ref >=60)
Globulin: 2.4 g/dL (calc) (ref 1.9–3.7)
Glucose, Bld: 101 mg/dL (ref 65–139)
Potassium: 5 mmol/L (ref 3.5–5.3)
Sodium: 141 mmol/L (ref 135–146)
Total Bilirubin: 0.5 mg/dL (ref 0.2–1.2)
Total Protein: 6.6 g/dL (ref 6.1–8.1)

## 2021-05-09 LAB — SEDIMENTATION RATE: Sed Rate: 2 mm/h (ref 0–20)

## 2021-05-09 LAB — PROTEIN ELECTROPHORESIS, SERUM, WITH REFLEX
Albumin ELP: 4.2 g/dL (ref 3.8–4.8)
Alpha 1: 0.2 g/dL (ref 0.2–0.3)
Alpha 2: 0.5 g/dL (ref 0.5–0.9)
Beta 2: 0.3 g/dL (ref 0.2–0.5)
Beta Globulin: 0.4 g/dL (ref 0.4–0.6)
Gamma Globulin: 0.9 g/dL (ref 0.8–1.7)
Total Protein: 6.6 g/dL (ref 6.1–8.1)

## 2021-05-09 LAB — CYCLIC CITRUL PEPTIDE ANTIBODY, IGG: Cyclic Citrullin Peptide Ab: >250 UNITS — ABNORMAL HIGH

## 2021-05-09 LAB — QUANTIFERON-TB GOLD PLUS
Mitogen-NIL: >10 IU/mL
NIL: 0.03 IU/mL
QuantiFERON-TB Gold Plus: NEGATIVE
TB1-NIL: 0.01 IU/mL
TB2-NIL: 0 IU/mL

## 2021-05-09 LAB — CBC WITH DIFFERENTIAL/PLATELET
Absolute Monocytes: 428 cells/uL (ref 200–950)
Basophils Absolute: 80 cells/uL (ref 0–200)
Basophils Relative: 1.7 %
Eosinophils Absolute: 19 cells/uL (ref 15–500)
Eosinophils Relative: 0.4 %
HCT: 45.7 % (ref 38.5–50.0)
Hemoglobin: 15.5 g/dL (ref 13.2–17.1)
Lymphs Abs: 785 cells/uL — ABNORMAL LOW (ref 850–3900)
MCH: 34.7 pg — ABNORMAL HIGH (ref 27.0–33.0)
MCHC: 33.9 g/dL (ref 32.0–36.0)
MCV: 102.2 fL — ABNORMAL HIGH (ref 80.0–100.0)
MPV: 10.6 fL (ref 7.5–12.5)
Monocytes Relative: 9.1 %
Neutro Abs: 3389 cells/uL (ref 1500–7800)
Neutrophils Relative %: 72.1 %
Platelets: 366 10*3/uL (ref 140–400)
RBC: 4.47 10*6/uL (ref 4.20–5.80)
RDW: 13.6 % (ref 11.0–15.0)
Total Lymphocyte: 16.7 %
WBC: 4.7 10*3/uL (ref 3.8–10.8)

## 2021-05-09 LAB — HEPATITIS B SURFACE ANTIGEN: Hepatitis B Surface Ag: NONREACTIVE

## 2021-05-09 LAB — HEPATITIS C ANTIBODY
Hepatitis C Ab: NONREACTIVE
SIGNAL TO CUT-OFF: 0.01 (ref <1.00)

## 2021-05-09 LAB — HEPATITIS B CORE ANTIBODY, IGM: Hep B C IgM: NONREACTIVE

## 2021-05-09 LAB — RHEUMATOID FACTOR: Rheumatoid fact SerPl-aCnc: 77 IU/mL — ABNORMAL HIGH (ref <14)

## 2021-05-09 NOTE — Progress Notes (Signed)
Lymphocyte count is low due to methotrexate use, CMP is normal, sedimentation rate is normal, rheumatoid factor and anti-CCP antibodies are positive due to rheumatoid arthritis.  Hepatitis panel is negative, TB Gold is negative, immunoglobulins are normal, SPEP is normal.  I will discuss results at the follow-up visit.

## 2021-05-10 ENCOUNTER — Other Ambulatory Visit (HOSPITAL_COMMUNITY): Payer: Self-pay

## 2021-05-10 MED ORDER — METHOTREXATE 2.5 MG PO TABS: ORAL_TABLET | ORAL | 0 refills | Status: DC

## 2021-05-10 NOTE — Telephone Encounter (Signed)
Called and spoke to [facility], pt's medication is coming to $28.42 through insurance for 16 tablets (28-day supply).

## 2021-05-10 NOTE — Telephone Encounter (Signed)
[facility] for [facility]TX [facility]. Will route back to [place]/Rachael to f/u regarding cost issue and tier-exception request  Knox Saliva, PharmD, MPH Clinical Pharmacist (Rheumatology and Pulmonology)

## 2021-05-14 ENCOUNTER — Other Ambulatory Visit (HOSPITAL_COMMUNITY): Payer: Self-pay

## 2021-05-14 NOTE — Telephone Encounter (Signed)
Called [employer] to request tier exception request for patient's methotrexate tabs. Last tier exception request was made on 03/05/21 and was denied  Medication is currently on Tier 3 per rep and cannot be lowered as it is on the lowest cost-sharing tier.  Since cost for tablets cannot be reduce, we can pursue [employer] patient assistance.  I am mailing him the application and he has been advised of income document requirement and to bring completed application to his f/u appointment on 05/20/21  We will start [employer] BIV since he may be able to receive at no cost through patient assistance.  Phone: [phone] (benefits team)  Knox Saliva, PharmD, MPH Clinical Pharmacist (Rheumatology and Pulmonology)

## 2021-05-15 NOTE — Telephone Encounter (Signed)
Submitted a Prior Authorization request to [employer] for RASUVO via CoverMyMeds. Will update once we receive a response.   Key: [id number]

## 2021-05-17 NOTE — Telephone Encounter (Signed)
Received a fax regarding Prior Authorization from [employer] for [place]. Authorization has been DENIED because patient has not tried vial/syringe  Patient was mailed [employer] application to complete and bring to clinic  on 05/30/21  Phone# [phone] Case: [id number]  Knox Saliva, PharmD, MPH Clinical Pharmacist (Rheumatology and Pulmonology)

## 2021-05-25 ENCOUNTER — Other Ambulatory Visit: Payer: Self-pay | Admitting: Family Medicine

## 2021-05-25 DIAGNOSIS — M06041 Rheumatoid arthritis without rheumatoid factor, right hand: Secondary | ICD-10-CM

## 2021-05-25 NOTE — Progress Notes (Signed)
Office Visit Note  Patient: Max [name]             Date of Birth: 04-21-1951           MRN: [id number]             PCP: Vivi Barrack, MD Referring: Vivi Barrack, MD Visit Date: 05/30/2021 Occupation: @GUAROCC @  Subjective:  Medication monitoring   History of Present Illness: Max [name] is a 70 y.o. male with history of seropositive erosive rheumatoid arthritis.  He states he has been doing well on methotrexate 4 tablets/week.  He denies any joint pain or joint swelling.  Activities of Daily Living:  Patient reports morning stiffness for 0 minutes.   Patient Denies nocturnal pain.  Difficulty dressing/grooming: Denies Difficulty climbing stairs: Denies Difficulty getting out of chair: Denies Difficulty using hands for taps, buttons, cutlery, and/or writing: Denies  Review of Systems  Constitutional:  Negative for fatigue.  HENT:  Negative for mouth sores, mouth dryness and nose dryness.   Eyes:  Negative for pain, itching and dryness.  Respiratory:  Negative for shortness of breath and difficulty breathing.   Cardiovascular:  Positive for palpitations. Negative for chest pain.  Gastrointestinal:  Negative for blood in stool, constipation and diarrhea.  Endocrine: Negative for increased urination.  Genitourinary:  Negative for difficulty urinating.  Musculoskeletal:  Negative for joint pain, joint pain, joint swelling, myalgias, morning stiffness, muscle tenderness and myalgias.  Skin:  Negative for color change, rash and redness.  Allergic/Immunologic: Negative for susceptible to infections.  Neurological:  Negative for dizziness, numbness, headaches, memory loss and weakness.  Hematological:  Positive for bruising/bleeding tendency.  Psychiatric/Behavioral:  Negative for confusion.    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   Essential hypertension 08/26/2007    Past Medical History:  Diagnosis Date   Abdominal hernia 11/28/2015   Allergic rhinitis    Anxiety    BPV (benign positional vertigo), right 05/11/2018   Cardiac arrhythmia    ED (erectile dysfunction)    Herpes    History of syncope    in the setting of anxiety per cardiology note   Hypertension    Kidney tumor (benign), right    sx 02/27/2015   Meningioma ([place])    had surgery    Paroxysmal atrial fibrillation ([place]) 07/15/2012   a) CHADSVASc score 1b) On full-dose ASA and Verapamil   RA (rheumatoid arthritis) ([place])    Seizures ([place]) 05/14/2011   "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    Vasovagal syncope     Family History  Problem Relation Age of Onset   Depression Other    Hypertension Other    GI problems Other    Lung disease Other    Hypertension Brother    Prostate cancer Paternal Grandfather    Healthy Son    Colon cancer Neg Hx    Esophageal cancer Neg Hx    Ulcerative colitis Neg Hx    Past Surgical History:  Procedure Laterality Date   BRAIN MENINGIOMA EXCISION  2012   CATARACT EXTRACTION W/ INTRAOCULAR LENS IMPLANT  ~ 2001  right   COLONOSCOPY  2008   EYE SURGERY  1964   right; "hit w/baseball; eye hemorrhaged"   [place]  ~ 2003   ROBOTIC ASSITED PARTIAL NEPHRECTOMY Right 02/29/2016   Procedure: XI ROBOTIC ASSITED PARTIAL NEPHRECTOMY;  Surgeon: Alexis Frock, MD;  Location: [facility];  Service: Urology;  Laterality: Right;   NEEDS ULTRASOUND   Social History   Social History Narrative   Regular Exercise   Immunization History  Administered Date(s) Administered   Fluad Quad(high Dose 65+) 11/03/2020   Influenza Split  12/18/2011   Influenza Whole 10/18/2009, 10/22/2010   Influenza, High Dose Seasonal PF 11/28/2015   Influenza, Seasonal, Injecte, Preservative Fre 12/20/2012   Influenza,inj,Quad PF,6+ Mos 12/21/2013, 02/08/2015   Influenza-Unspecified 10/10/2018   PFIZER(Purple Top)SARS-COV-2 Vaccination 02/05/2020, 02/28/2020, 11/03/2020   Pneumococcal Conjugate-13 03/30/2017   Pneumococcal Polysaccharide-23 03/01/2016   Td 12/15/1998, 10/18/2009     Objective: Vital Signs: BP (!) 160/82 (BP Location: Left Arm, Patient Position: Sitting, Cuff Size: Normal)   Pulse (!) 102   Resp 17   Ht 6' (1.829 m)   Wt 204 lb 9.6 oz (92.8 kg)   BMI 27.75 kg/m    Physical Exam Vitals and nursing note reviewed.  Constitutional:      Appearance: He is well-developed.  HENT:     Head: Normocephalic and atraumatic.  Eyes:     Conjunctiva/sclera: Conjunctivae normal.     Pupils: Pupils are equal, round, and reactive to light.  Cardiovascular:     Rate and Rhythm: Normal rate and regular rhythm.     Heart sounds: Normal heart sounds.  Pulmonary:     Effort: Pulmonary effort is normal.     Breath sounds: Normal breath sounds.  Abdominal:     General: Bowel sounds are normal.     Palpations: Abdomen is soft.  Musculoskeletal:     Cervical back: Normal range of motion and neck supple.  Skin:    General: Skin is warm and dry.     Capillary Refill: Capillary refill takes less than 2 seconds.  Neurological:     Mental Status: He is alert and oriented to person, place, and time.  Psychiatric:        Behavior: Behavior normal.     Musculoskeletal Exam: C-spine was in good range of motion.  Shoulder joints, elbow joints, wrist joints, MCPs PIPs and DIPs with good range of motion.  He has synovial thickening over MCP joints but no synovitis was noted.  Hip joints, knee joints, ankles are in good range of motion.  He had no tenderness over MTPs.  CDAI Exam: CDAI Score: 0  Patient Global: 0 mm; Provider Global:  0 mm Swollen: 0 ; Tender: 0  Joint Exam 05/30/2021   No joint exam has been documented for this visit   There is currently no information documented on the homunculus. Go to the Rheumatology activity and complete the homunculus joint exam.  Investigation: No additional findings.  Imaging: XR Foot 2 Views Left  Result Date: 05/06/2021 First MTP, PIP and DIP narrowing was noted.  Narrowing of all MTP joints with fifth MTP joint erosive changes were noted.  Dorsal spurring was noted.  Metatarsal tarsal joint space narrowing was noted.  No tibiotalar or subtalar joint space narrowing was noted.  Posterior calcaneal spur was noted. Impression: These findings are consistent with erosive rheumatoid arthritis of the foot.  XR Foot 2 Views Right  Result Date: 05/06/2021 First MTP,  PIP and DIP narrowing was noted.  Narrowing of all MCP joints with erosive changes in the fifth MTP joint was noted.  Intertarsal narrowing with dorsal spurring was noted.  No tibiotalar or subtalar joint space narrowing was noted.  Inferior and posterior calcaneal spurs were noted. Impression: These findings are consistent with erosive rheumatoid arthritis of the foot.  XR Hand 2 View Left  Result Date: 05/06/2021 First second and third MCP narrowing was noted.  CMC, PIP and DIP narrowing was noted.  No intercarpal radiocarpal joint space narrowing was noted.  No erosive changes were noted.  Possible enchondroma was noted in the fourth proximal phalanx. Impression: These findings are consistent with osteoarthritis and inflammatory arthritis most likely rheumatoid arthritis.  XR Hand 2 View Right  Result Date: 05/06/2021 Juxta-articular osteopenia was noted.  Narrowing of first, second and third MCP joint was noted.  PIP and DIP narrowing was noted.  No intercarpal radiocarpal joint space narrowing was noted.  CMC narrowing was noted.  No erosive changes were noted. Impression: These findings are consistent with  osteoarthritis and inflammatory arthritis most likely rheumatoid arthritis.   Recent Labs: Lab Results  Component Value Date   WBC 4.7 05/06/2021   HGB 15.5 05/06/2021   PLT 366 05/06/2021   NA 141 05/06/2021   K 5.0 05/06/2021   CL 107 05/06/2021   CO2 29 05/06/2021   GLUCOSE 101 05/06/2021   BUN 18 05/06/2021   CREATININE 1.13 05/06/2021   BILITOT 0.5 05/06/2021   ALKPHOS 74 05/04/2020   AST 15 05/06/2021   ALT 16 05/06/2021   PROT 6.6 05/06/2021   PROT 6.6 05/06/2021   ALBUMIN 4.6 05/04/2020   CALCIUM 9.6 05/06/2021   GFRAA 76 05/06/2021   QFTBGOLDPLUS NEGATIVE 05/06/2021   May 06, 2021 SPEP normal, immunoglobulins normal, TB Gold negative, hepatitis B-, hepatitis C negative, sed rate 2, RF 77, anti-CCP> 250   Speciality Comments: Dxd 2011. MTX x11 yrs. off and on.  Procedures:  No procedures performed Allergies: Vicodin [hydrocodone-acetaminophen]   Assessment / Plan:     Visit Diagnoses: Rheumatoid arthritis with rheumatoid factor of multiple sites without organ or systems involvement ([place]) - +RF,+CCP Ab. dxd 2011.Severe erosive changed noted on xrays.  X-ray findings are reviewed with the patient today.  He had bilateral fifth MTP erosions.  He also had severe MCP narrowing on the hand x-rays.  Patient states he has been on methotrexate 4 tablets p.o. weekly for a long time and he has not had any joint pain or joint swelling.  I will schedule ultrasound to look for synovitis.  If the ultrasound shows synovitis then we may increase the dose of methotrexate.  Association of anti-CCP antibody with more aggressive rheumatoid arthritis was discussed.  High risk medication use - MTX 4 tabs po qwk, Folic acid 093GHW qd.  Labs from May 06, 2021 were within normal limits.  We will check labs every 3 month  Rheumatoid arthritis involving both hands with positive rheumatoid factor ([place]) - Possible enchondroma noted in the left fourth proximal phalanx.  X-ray findings were discussed  with the patient.  Referral to orthopedics was also discussed.  He was in agreement.I will refer him to hand surgery for evaluation.  I advised him to stop methotrexate in case he develops an infection and resume once the infection resolves.Instructions regarding COVID-19 booster were also given.  All the instructions were placed in the AVS.  Rheumatoid arthritis involving both feet with positive rheumatoid factor ([place]) -  Erosive changes noted.  He had no tenderness or synovitis on examination today.  Essential hypertension  Paroxysmal atrial fibrillation ([facility]) - He is on aspirin 81 mg p.o. daily  Dyslipidemia-Increased risk of heart disease with rheumatoid arthritis was discussed.  Dietary modifications and exercise were emphasized.  Pulmonary nodule - Followed by pulmonologist.  Actinic keratosis  Defect of right cornea  History of cataract  Orders: Orders Placed This Encounter  Procedures   AMB referral to orthopedics    No orders of the defined types were placed in this encounter.    Follow-Up Instructions: Return in about 3 months (around 08/30/2021) for Rheumatoid arthritis.   Bo Merino, MD  Note - This record has been created using Editor, commissioning.  Chart creation errors have been sought, but may not always  have been located. Such creation errors do not reflect on  the standard of medical care.

## 2021-05-30 ENCOUNTER — Ambulatory Visit (INDEPENDENT_AMBULATORY_CARE_PROVIDER_SITE_OTHER): Payer: Medicare Other | Admitting: Rheumatology

## 2021-05-30 ENCOUNTER — Other Ambulatory Visit: Payer: Self-pay

## 2021-05-30 ENCOUNTER — Encounter: Payer: Self-pay | Admitting: Rheumatology

## 2021-05-30 VITALS — BP 160/82 | HR 102 | Resp 17 | Ht 72.0 in | Wt 204.6 lb

## 2021-05-30 DIAGNOSIS — Z8669 Personal history of other diseases of the nervous system and sense organs: Secondary | ICD-10-CM

## 2021-05-30 DIAGNOSIS — Z79899 Other long term (current) drug therapy: Secondary | ICD-10-CM

## 2021-05-30 DIAGNOSIS — I480 Paroxysmal atrial fibrillation: Secondary | ICD-10-CM

## 2021-05-30 DIAGNOSIS — M05742 Rheumatoid arthritis with rheumatoid factor of left hand without organ or systems involvement: Secondary | ICD-10-CM

## 2021-05-30 DIAGNOSIS — M05741 Rheumatoid arthritis with rheumatoid factor of right hand without organ or systems involvement: Secondary | ICD-10-CM | POA: Diagnosis not present

## 2021-05-30 DIAGNOSIS — I10 Essential (primary) hypertension: Secondary | ICD-10-CM

## 2021-05-30 DIAGNOSIS — R911 Solitary pulmonary nodule: Secondary | ICD-10-CM

## 2021-05-30 DIAGNOSIS — M05771 Rheumatoid arthritis with rheumatoid factor of right ankle and foot without organ or systems involvement: Secondary | ICD-10-CM | POA: Diagnosis not present

## 2021-05-30 DIAGNOSIS — L570 Actinic keratosis: Secondary | ICD-10-CM

## 2021-05-30 DIAGNOSIS — M0579 Rheumatoid arthritis with rheumatoid factor of multiple sites without organ or systems involvement: Secondary | ICD-10-CM | POA: Diagnosis not present

## 2021-05-30 DIAGNOSIS — E785 Hyperlipidemia, unspecified: Secondary | ICD-10-CM

## 2021-05-30 DIAGNOSIS — H18891 Other specified disorders of cornea, right eye: Secondary | ICD-10-CM

## 2021-05-30 DIAGNOSIS — M05772 Rheumatoid arthritis with rheumatoid factor of left ankle and foot without organ or systems involvement: Secondary | ICD-10-CM

## 2021-05-30 DIAGNOSIS — D1612 Benign neoplasm of short bones of left upper limb: Secondary | ICD-10-CM

## 2021-05-30 NOTE — Patient Instructions (Signed)
Standing Labs We placed an order today for your standing lab work.   Please have your standing labs drawn in August and every 3 months  If possible, please have your labs drawn 2 weeks prior to your appointment so that the provider can discuss your results at your appointment.  Please note that you may see your imaging and lab results in [place] before we have reviewed them. We may be awaiting multiple results to interpret others before contacting you. Please allow our office up to 72 hours to thoroughly review all of the results before contacting the office for clarification of your results.  We have open lab daily: Monday through Thursday from 1:30-4:30 PM and Friday from 1:30-4:00 PM at the office of Dr. Bo Merino, [facility].   Please be advised, all patients with office appointments requiring lab work will take precedent over walk-in lab work.  If possible, please come for your lab work on Monday and Friday afternoons, as you may experience shorter wait times. The office is located at [address], [place], [place], [place] [address] No appointment is necessary.   Labs are drawn by [facility]. Please bring your co-pay at the time of your lab draw.  You may receive a bill from [facility] for your lab work.  If you wish to have your labs drawn at another location, please call the office 24 hours in advance to send orders.  If you have any questions regarding directions or hours of operation,  please call [phone].   As a reminder, please drink plenty of water prior to coming for your lab work. Thanks!   COVID-19 vaccine recommendations:   COVID-19 vaccine is recommended for everyone (unless you are allergic to a vaccine component), even if you are on a medication that suppresses your immune system.   If you are on Methotrexate, Cellcept (mycophenolate), Rinvoq, Morrie Sheldon, and Olumiant- hold the medication for 1 week after each vaccine. Hold Methotrexate for 2 weeks  after the single dose COVID-19 vaccine.   If you are on Orencia subcutaneous injection - hold medication one week prior to and one week after the first COVID-19 vaccine dose (only).   If you are on Orencia IV infusions- time vaccination administration so that the first COVID-19 vaccination will occur four weeks after the infusion and postpone the subsequent infusion by one week.   If you are on Cyclophosphamide or Rituxan infusions please contact your doctor prior to receiving the COVID-19 vaccine.   Do not take Tylenol or any anti-inflammatory medications (NSAIDs) 24 hours prior to the COVID-19 vaccination.   There is no direct evidence about the efficacy of the COVID-19 vaccine in individuals who are on medications that suppress the immune system.   Even if you are fully vaccinated, and you are on any medications that suppress your immune system, please continue to wear a mask, maintain at least six feet social distance and practice hand hygiene.   If you develop a COVID-19 infection, please contact your PCP or our office to determine if you need monoclonal antibody infusion.  The booster vaccine is now available for immunocompromised patients.   Please see the following web sites for updated information.   [email or web address]  Vaccines You are taking a medication(s) that can suppress your immune system.  The following immunizations are recommended: Flu annually Covid-19  Td/Tdap (tetanus, diphtheria, pertussis) every 10 years Pneumonia (Prevnar 15 then Pneumovax 23 at least 1 year apart.  Alternatively, can take Prevnar 20 without needing additional  dose) Shingrix (after age 51): 2 doses from 4 weeks to 6 months apart  Please check with your PCP to make sure you are up to date.   If you test POSITIVE for COVID19 and have MILD to MODERATE symptoms: First, call your PCP if you would like to receive COVID19 treatment  AND Hold your medications during the infection and for at least 1 week after your symptoms have resolved: Injectable medication (Benlysta, Cimzia, Cosentyx, Enbrel, Humira, Orencia, Remicade, Simponi, Stelara, Taltz, Tremfya) Methotrexate Leflunomide (Arava) Mycophenolate (Cellcept) Morrie Sheldon, Olumiant, or Rinvoq If you take Actemra or Kevzara, you DO NOT need to hold these for COVID19 infection.  If you test POSITIVE for COVID19 and have NO symptoms: First, call your PCP if you would like to receive COVID19 treatment AND Hold your medications for at least 10 days after the day that you tested positive Injectable medication (Benlysta, Cimzia, Cosentyx, Enbrel, Humira, Orencia, Remicade, Simponi, Stelara, Taltz, Tremfya) Methotrexate Leflunomide (Arava) Mycophenolate (Cellcept) Morrie Sheldon, Olumiant, or Rinvoq If you take Actemra or Kevzara, you DO NOT need to hold these for COVID19 infection.   If you have signs or symptoms of an infection or start antibiotics: First, call your PCP for workup of your infection. Hold your medication through the infection, until you complete your antibiotics, and until symptoms resolve if you take the following: Injectable medication (Actemra, Benlysta, Cimzia, Cosentyx, Enbrel, Humira, Kevzara, Orencia, Remicade, Simponi, Stelara, Taltz, Tremfya) Methotrexate Leflunomide (Arava) Mycophenolate (Cellcept) Morrie Sheldon, Olumiant, or Rinvoq    Heart Disease Prevention   Your inflammatory disease increases your risk of heart disease which includes heart attack, stroke, atrial fibrillation (irregular heartbeats), high blood pressure, heart failure and atherosclerosis (plaque in the arteries).  It is important to reduce your risk by:   Keep blood pressure, cholesterol, and blood sugar at healthy levels   Smoking Cessation   Maintain a healthy weight  BMI 20-25   Eat a healthy diet  Plenty of fresh fruit, vegetables, and whole grains  Limit saturated fats,  foods high in sodium, and added sugars  DASH and Mediterranean diet   Increase physical activity  Recommend moderate physically activity for 150 minutes per week/ 30 minutes a day for five days a week These can be broken up into three separate ten-minute sessions during the day.   Reduce Stress  Meditation, slow breathing exercises, yoga, coloring books  Dental visits twice a year

## 2021-06-03 ENCOUNTER — Other Ambulatory Visit: Payer: Self-pay | Admitting: Rheumatology

## 2021-06-03 DIAGNOSIS — M06041 Rheumatoid arthritis without rheumatoid factor, right hand: Secondary | ICD-10-CM

## 2021-06-14 ENCOUNTER — Other Ambulatory Visit: Payer: Medicare Other | Admitting: Rheumatology

## 2021-06-19 ENCOUNTER — Other Ambulatory Visit: Payer: Medicare Other | Admitting: Rheumatology

## 2021-06-19 ENCOUNTER — Other Ambulatory Visit: Payer: Self-pay

## 2021-06-19 ENCOUNTER — Ambulatory Visit (INDEPENDENT_AMBULATORY_CARE_PROVIDER_SITE_OTHER): Payer: Medicare Other | Admitting: Rheumatology

## 2021-06-19 ENCOUNTER — Ambulatory Visit: Payer: Self-pay

## 2021-06-19 DIAGNOSIS — M06042 Rheumatoid arthritis without rheumatoid factor, left hand: Secondary | ICD-10-CM

## 2021-06-19 DIAGNOSIS — M06041 Rheumatoid arthritis without rheumatoid factor, right hand: Secondary | ICD-10-CM

## 2021-06-19 DIAGNOSIS — M0579 Rheumatoid arthritis with rheumatoid factor of multiple sites without organ or systems involvement: Secondary | ICD-10-CM | POA: Diagnosis not present

## 2021-06-19 DIAGNOSIS — Z79899 Other long term (current) drug therapy: Secondary | ICD-10-CM

## 2021-06-19 NOTE — Progress Notes (Signed)
Patient has known history of rheumatoid arthritis.  He came today to get ultrasound examination of bilateral hands to evaluate for synovitis or tenosynovitis.  The findings were as follows:  Ultrasound examination of the bilateral hands was performed per EULAR recommendations. Using 15 MHz transducer, grayscale and power Doppler bilateral second, third, and fifth MCP joints and bilateral wrist joints both dorsal and volar aspects were evaluated to look for synovitis or tenosynovitis. The findings were that there was no synovitis or tenosynovitis on ultrasound examination.  Narrowing of all MCP joints with erosive changes in the bilateral second MCP joints was noted.  Right median nerve was 0.09 cm squares which was within normal limits and left median nerve was 0.09 cm squares which was within normal limits  Impression: Ultrasound examination did not show any active synovitis or tenosynovitis.  Narrowing of the MCP joints with erosive changes in bilateral second MCP joints was noted.  Bilateral median nerves were within normal limits. Bo Merino, MD

## 2021-06-30 DIAGNOSIS — Z23 Encounter for immunization: Secondary | ICD-10-CM | POA: Diagnosis not present

## 2021-07-24 ENCOUNTER — Other Ambulatory Visit: Payer: Self-pay | Admitting: Family Medicine

## 2021-08-15 NOTE — Progress Notes (Signed)
Office Visit Note  Patient: Max [name]             Date of Birth: 1951/12/13           MRN: [id number]             PCP: Vivi Barrack, MD Referring: Vivi Barrack, MD Visit Date: 08/29/2021 Occupation: @GUAROCC @  Subjective:  Medication management   History of Present Illness: Max [name] is a 70 y.o. male with a history of rheumatoid arthritis and osteoarthritis.  He denies any increased joint pain or joint swelling.  He continues to do well on the current medications.  Activities of Daily Living:  Patient reports morning stiffness for 0 minutes.   Patient Denies nocturnal pain.  Difficulty dressing/grooming: Denies Difficulty climbing stairs: Denies Difficulty getting out of chair: Denies Difficulty using hands for taps, buttons, cutlery, and/or writing: Denies  Review of Systems  Constitutional:  Negative for fatigue.  HENT:  Negative for mouth sores, mouth dryness and nose dryness.   Eyes:  Negative for pain, itching and dryness.  Respiratory:  Negative for shortness of breath and difficulty breathing.   Cardiovascular:  Negative for chest pain and palpitations.  Gastrointestinal:  Negative for blood in stool, constipation and diarrhea.  Endocrine: Negative for increased urination.  Genitourinary:  Negative for difficulty urinating.  Musculoskeletal:  Negative for joint pain, joint pain, joint swelling, myalgias, morning stiffness, muscle tenderness and myalgias.  Skin:  Negative for color change, rash and redness.  Allergic/Immunologic: Negative for susceptible to infections.  Neurological:  Negative for dizziness, numbness, headaches, memory loss and weakness.  Hematological:  Positive for bruising/bleeding tendency.  Psychiatric/Behavioral:  Negative for confusion.    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   Essential hypertension 08/26/2007    Past Medical History:  Diagnosis Date   Abdominal hernia 11/28/2015   Allergic rhinitis    Anxiety    BPV (benign positional vertigo), right 05/11/2018   Cardiac arrhythmia    ED (erectile dysfunction)    Herpes    History of syncope    in the setting of anxiety per cardiology note   Hypertension    Kidney tumor (benign), right    sx 02/27/2015   Meningioma ([place])    had surgery    Paroxysmal atrial fibrillation ([place]) 07/15/2012   a) CHADSVASc score 1b) On full-dose ASA and Verapamil   RA (rheumatoid arthritis) ([place])    Seizures ([place]) 05/14/2011   "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    Vasovagal syncope     Family History  Problem Relation Age of Onset   Depression Other    Hypertension Other    GI problems Other    Lung disease Other    Hypertension Brother    Prostate cancer Paternal Grandfather    Healthy Son    Colon cancer Neg Hx    Esophageal cancer Neg Hx    Ulcerative colitis Neg Hx    Past Surgical History:  Procedure Laterality Date   BRAIN MENINGIOMA EXCISION  2012   CATARACT EXTRACTION W/ INTRAOCULAR LENS IMPLANT  ~ 2001  right   COLONOSCOPY  2008   EYE SURGERY  1964   right; "hit w/baseball; eye hemorrhaged"   [place]  ~ 2003   ROBOTIC ASSITED PARTIAL NEPHRECTOMY Right 02/29/2016   Procedure: XI ROBOTIC ASSITED PARTIAL NEPHRECTOMY;  Surgeon: Alexis Frock, MD;  Location: [facility];  Service: Urology;  Laterality: Right;   NEEDS ULTRASOUND   Social History   Social History Narrative   Regular Exercise   Immunization History  Administered Date(s) Administered   Fluad Quad(high Dose 65+) 11/03/2020   Influenza Split 12/18/2011    Influenza Whole 10/18/2009, 10/22/2010   Influenza, High Dose Seasonal PF 11/28/2015   Influenza, Seasonal, Injecte, Preservative Fre 12/20/2012   Influenza,inj,Quad PF,6+ Mos 12/21/2013, 02/08/2015   Influenza-Unspecified 10/10/2018   PFIZER Comirnaty(Gray Top)Covid-19 Tri-Sucrose Vaccine 06/30/2021   PFIZER(Purple Top)SARS-COV-2 Vaccination 02/05/2020, 02/28/2020, 11/03/2020   Pneumococcal Conjugate-13 03/30/2017   Pneumococcal Polysaccharide-23 03/01/2016   Td 12/15/1998, 10/18/2009     Objective: Vital Signs: BP (!) 156/96 (BP Location: Left Arm, Patient Position: Sitting, Cuff Size: Normal)   Pulse 77   Ht 6' (1.829 m)   Wt 209 lb (94.8 kg)   BMI 28.35 kg/m    Physical Exam Vitals and nursing note reviewed.  Constitutional:      Appearance: He is well-developed.  HENT:     Head: Normocephalic and atraumatic.  Eyes:     Conjunctiva/sclera: Conjunctivae normal.     Pupils: Pupils are equal, round, and reactive to light.  Cardiovascular:     Rate and Rhythm: Normal rate and regular rhythm.     Heart sounds: Normal heart sounds.  Pulmonary:     Effort: Pulmonary effort is normal.     Breath sounds: Normal breath sounds.  Abdominal:     General: Bowel sounds are normal.     Palpations: Abdomen is soft.  Musculoskeletal:     Cervical back: Normal range of motion and neck supple.  Skin:    General: Skin is warm and dry.     Capillary Refill: Capillary refill takes less than 2 seconds.  Neurological:     Mental Status: He is alert and oriented to person, place, and time.  Psychiatric:        Behavior: Behavior normal.     Musculoskeletal Exam: C-spine was in good range of motion.  Shoulder joints, elbow joints, wrist joints, MCPs PIPs and DIPs with good range of motion.  He had some PIP and DIP thickening with no synovitis.  Hip joints and knee joints in good range of motion.  He had no tenderness over ankles or MTPs.  CDAI Exam: CDAI Score: 0  Patient Global: 0 mm;  Provider Global: 0 mm Swollen: 0 ; Tender: 0  Joint Exam 08/29/2021   No joint exam has been documented for this visit   There is currently no information documented on the homunculus. Go to the Rheumatology activity and complete the homunculus joint exam.  Investigation: No additional findings.  Imaging: No results found.  Recent Labs: Lab Results  Component Value Date   WBC 4.7 05/06/2021   HGB 15.5 05/06/2021   PLT 366 05/06/2021   NA 141 05/06/2021   K 5.0 05/06/2021   CL 107 05/06/2021   CO2 29 05/06/2021   GLUCOSE 101 05/06/2021   BUN 18 05/06/2021   CREATININE 1.13 05/06/2021   BILITOT 0.5 05/06/2021   ALKPHOS 74 05/04/2020   AST 15 05/06/2021   ALT 16 05/06/2021  PROT 6.6 05/06/2021   PROT 6.6 05/06/2021   ALBUMIN 4.6 05/04/2020   CALCIUM 9.6 05/06/2021   GFRAA 76 05/06/2021   QFTBGOLDPLUS NEGATIVE 05/06/2021    Speciality Comments: Dxd 2011. MTX x11 yrs. off and on.  Procedures:  No procedures performed Allergies: Vicodin [hydrocodone-acetaminophen]   Assessment / Plan:     Visit Diagnoses: Rheumatoid arthritis with rheumatoid factor of multiple sites without organ or systems involvement ([place]) - +RF,+CCP Ab. dxd 2011.Severe erosive changed noted on xrays.  Patient had no synovitis on my examination.  High risk medication use - MTX 4 tabs po qwk, Folic acid 160VPX qd - Plan: CBC with Differential/Platelet, COMPLETE METABOLIC PANEL WITH GFR today and then every 3 months to monitor for drug toxicity.  He was advised to stop methotrexate in case he develops an infection and restart after the infection resolves.  Information regarding realization was also placed in the AVS.  Rheumatoid arthritis involving both hands with positive rheumatoid factor ([place]) - Possible enchondroma noted in the left fourth proximal phalanx.  He does not want to see a Copy.  Rheumatoid arthritis involving both feet with positive rheumatoid factor ([facility]) - Erosive changes  noted.  Essential hypertension-his blood pressure is elevated.  He has been advised to monitor blood pressure closely.  Paroxysmal atrial fibrillation ([facility]) - He is on aspirin 81 mg p.o. daily  Pulmonary nodule - Followed by pulmonologist.  Dyslipidemia-dietary modifications and exercises were discussed.  Increased risk of heart disease with rheumatoid arthritis was discussed.  Defect of right cornea  Actinic keratosis  History of cataract  Orders: Orders Placed This Encounter  Procedures   CBC with Differential/Platelet   COMPLETE METABOLIC PANEL WITH GFR   No orders of the defined types were placed in this encounter.    Follow-Up Instructions: Return in about 5 months (around 01/29/2022) for Rheumatoid arthritis.   Bo Merino, MD  Note - This record has been created using Editor, commissioning.  Chart creation errors have been sought, but may not always  have been located. Such creation errors do not reflect on  the standard of medical care.

## 2021-08-29 ENCOUNTER — Encounter: Payer: Self-pay | Admitting: Rheumatology

## 2021-08-29 ENCOUNTER — Other Ambulatory Visit: Payer: Self-pay

## 2021-08-29 ENCOUNTER — Ambulatory Visit (INDEPENDENT_AMBULATORY_CARE_PROVIDER_SITE_OTHER): Payer: Medicare Other | Admitting: Rheumatology

## 2021-08-29 VITALS — BP 156/96 | HR 77 | Ht 72.0 in | Wt 209.0 lb

## 2021-08-29 DIAGNOSIS — M05772 Rheumatoid arthritis with rheumatoid factor of left ankle and foot without organ or systems involvement: Secondary | ICD-10-CM

## 2021-08-29 DIAGNOSIS — M05742 Rheumatoid arthritis with rheumatoid factor of left hand without organ or systems involvement: Secondary | ICD-10-CM | POA: Diagnosis not present

## 2021-08-29 DIAGNOSIS — M05741 Rheumatoid arthritis with rheumatoid factor of right hand without organ or systems involvement: Secondary | ICD-10-CM

## 2021-08-29 DIAGNOSIS — L570 Actinic keratosis: Secondary | ICD-10-CM

## 2021-08-29 DIAGNOSIS — H18891 Other specified disorders of cornea, right eye: Secondary | ICD-10-CM

## 2021-08-29 DIAGNOSIS — E785 Hyperlipidemia, unspecified: Secondary | ICD-10-CM

## 2021-08-29 DIAGNOSIS — R911 Solitary pulmonary nodule: Secondary | ICD-10-CM

## 2021-08-29 DIAGNOSIS — M0579 Rheumatoid arthritis with rheumatoid factor of multiple sites without organ or systems involvement: Secondary | ICD-10-CM | POA: Diagnosis not present

## 2021-08-29 DIAGNOSIS — Z8669 Personal history of other diseases of the nervous system and sense organs: Secondary | ICD-10-CM

## 2021-08-29 DIAGNOSIS — Z79899 Other long term (current) drug therapy: Secondary | ICD-10-CM | POA: Diagnosis not present

## 2021-08-29 DIAGNOSIS — I10 Essential (primary) hypertension: Secondary | ICD-10-CM | POA: Diagnosis not present

## 2021-08-29 DIAGNOSIS — I480 Paroxysmal atrial fibrillation: Secondary | ICD-10-CM | POA: Diagnosis not present

## 2021-08-29 DIAGNOSIS — M05771 Rheumatoid arthritis with rheumatoid factor of right ankle and foot without organ or systems involvement: Secondary | ICD-10-CM | POA: Diagnosis not present

## 2021-08-29 LAB — COMPLETE METABOLIC PANEL WITH GFR
AG Ratio: 1.9 (calc) (ref 1.0–2.5)
ALT: 18 U/L (ref 9–46)
AST: 19 U/L (ref 10–35)
Albumin: 4.6 g/dL (ref 3.6–5.1)
Alkaline phosphatase (APISO): 69 U/L (ref 35–144)
BUN: 18 mg/dL (ref 7–25)
CO2: 31 mmol/L (ref 20–32)
Calcium: 9.7 mg/dL (ref 8.6–10.3)
Chloride: 104 mmol/L (ref 98–110)
Creat: 1.24 mg/dL (ref 0.70–1.28)
Globulin: 2.4 g/dL (calc) (ref 1.9–3.7)
Glucose, Bld: 111 mg/dL — ABNORMAL HIGH (ref 65–99)
Potassium: 4.6 mmol/L (ref 3.5–5.3)
Sodium: 141 mmol/L (ref 135–146)
Total Bilirubin: 0.9 mg/dL (ref 0.2–1.2)
Total Protein: 7 g/dL (ref 6.1–8.1)
eGFR: 63 mL/min/{1.73_m2} (ref >=60)

## 2021-08-29 LAB — CBC WITH DIFFERENTIAL/PLATELET
Absolute Monocytes: 402 cells/uL (ref 200–950)
Basophils Absolute: 60 cells/uL (ref 0–200)
Basophils Relative: 0.9 %
Eosinophils Absolute: 47 cells/uL (ref 15–500)
Eosinophils Relative: 0.7 %
HCT: 45.3 % (ref 38.5–50.0)
Hemoglobin: 15.4 g/dL (ref 13.2–17.1)
Lymphs Abs: 1688 cells/uL (ref 850–3900)
MCH: 34.1 pg — ABNORMAL HIGH (ref 27.0–33.0)
MCHC: 34 g/dL (ref 32.0–36.0)
MCV: 100.4 fL — ABNORMAL HIGH (ref 80.0–100.0)
MPV: 10.9 fL (ref 7.5–12.5)
Monocytes Relative: 6 %
Neutro Abs: 4502 cells/uL (ref 1500–7800)
Neutrophils Relative %: 67.2 %
Platelets: 348 10*3/uL (ref 140–400)
RBC: 4.51 10*6/uL (ref 4.20–5.80)
RDW: 13.6 % (ref 11.0–15.0)
Total Lymphocyte: 25.2 %
WBC: 6.7 10*3/uL (ref 3.8–10.8)

## 2021-08-29 NOTE — Patient Instructions (Signed)
Standing Labs We placed an order today for your standing lab work.   Please have your standing labs ( CBC with differential, CMP with GFR) drawn in December and every 3 months  If possible, please have your labs drawn 2 weeks prior to your appointment so that the provider can discuss your results at your appointment.  Please note that you may see your imaging and lab results in [place] before we have reviewed them. We may be awaiting multiple results to interpret others before contacting you. Please allow our office up to 72 hours to thoroughly review all of the results before contacting the office for clarification of your results.  We have open lab daily: Monday through Thursday from 1:30-4:30 PM and Friday from 1:30-4:00 PM at the office of Dr. Bo Merino, [facility].   Please be advised, all patients with office appointments requiring lab work will take precedent over walk-in lab work.  If possible, please come for your lab work on Monday and Friday afternoons, as you may experience shorter wait times. The office is located at [address] [place], [place],  [address] No appointment is necessary.   Labs are drawn by [facility]. Please bring your co-pay at the time of your lab draw.  You may receive a bill from [place] for your lab work.  If you wish to have your labs drawn at another location, please call the office 24 hours in advance to send orders.  If you have any questions regarding directions or hours of operation,  please call [phone].   As a reminder, please drink plenty of water prior to coming for your lab work. Thanks!   Vaccines You are taking a medication(s) that can suppress your immune system.  The following immunizations are recommended: Flu annually Covid-19  Td/Tdap (tetanus, diphtheria, pertussis) every 10 years Pneumonia (Prevnar 15 then Pneumovax 23 at least 1 year apart.  Alternatively, can take Prevnar 20 without needing  additional dose) Shingrix: 2 doses from 4 weeks to 6 months apart  Please check with your PCP to make sure you are up to date.   If you test POSITIVE for COVID19 and have MILD to MODERATE symptoms: First, call your PCP if you would like to receive COVID19 treatment AND Hold your medications during the infection and for at least 1 week after your symptoms have resolved: Injectable medication (Benlysta, Cimzia, Cosentyx, Enbrel, Humira, Orencia, Remicade, Simponi, Stelara, Taltz, Tremfya) Methotrexate Leflunomide (Arava) Azathioprine Mycophenolate (Cellcept) Roma Kayser, or Rinvoq Otezla If you take Actemra or Kevzara, you DO NOT need to hold these for COVID19 infection.  If you test POSITIVE for COVID19 and have NO symptoms: First, call your PCP if you would like to receive COVID19 treatment AND Hold your medications for at least 10 days after the day that you tested positive Injectable medication (Benlysta, Cimzia, Cosentyx, Enbrel, Humira, Orencia, Remicade, Simponi, Stelara, Taltz, Tremfya) Methotrexate Leflunomide (Arava) Azathioprine Mycophenolate (Cellcept) Roma Kayser, or Rinvoq Otezla If you take Actemra or Kevzara, you DO NOT need to hold these for COVID19 infection.  If you have signs or symptoms of an infection or start antibiotics: First, call your PCP for workup of your infection. Hold your medication through the infection, until you complete your antibiotics, and until symptoms resolve if you take the following: Injectable medication (Actemra, Benlysta, Cimzia, Cosentyx, Enbrel, Humira, Kevzara, Orencia, Remicade, Simponi, Stelara, Taltz, Tremfya) Methotrexate Leflunomide (Arava) Mycophenolate (Cellcept) Roma Kayser, or Rinvoq  Heart Disease Prevention   Your inflammatory disease  increases your risk of heart disease which includes heart attack, stroke, atrial fibrillation (irregular heartbeats), high blood pressure, heart failure and  atherosclerosis (plaque in the arteries).  It is important to reduce your risk by:   Keep blood pressure, cholesterol, and blood sugar at healthy levels   Smoking Cessation   Maintain a healthy weight  BMI 20-25   Eat a healthy diet  Plenty of fresh fruit, vegetables, and whole grains  Limit saturated fats, foods high in sodium, and added sugars  DASH and Mediterranean diet   Increase physical activity  Recommend moderate physically activity for 150 minutes per week/ 30 minutes a day for five days a week These can be broken up into three separate ten-minute sessions during the day.   Reduce Stress  Meditation, slow breathing exercises, yoga, coloring books  Dental visits twice a year

## 2021-08-30 NOTE — Progress Notes (Signed)
CBC and CMP are stable.  MCV is slightly which can represent folic acid deficiency.  Please advise patient to take folic acid 548 mcg, 2 tablets daily.

## 2021-10-26 ENCOUNTER — Other Ambulatory Visit: Payer: Self-pay | Admitting: Family Medicine

## 2021-11-01 DIAGNOSIS — Z23 Encounter for immunization: Secondary | ICD-10-CM | POA: Diagnosis not present

## 2021-11-18 ENCOUNTER — Ambulatory Visit (INDEPENDENT_AMBULATORY_CARE_PROVIDER_SITE_OTHER): Payer: Medicare Other

## 2021-11-18 ENCOUNTER — Other Ambulatory Visit: Payer: Self-pay

## 2021-11-18 VITALS — BP 124/78 | HR 95 | Temp 98.2°F | Wt 205.2 lb

## 2021-11-18 DIAGNOSIS — Z0000 Encounter for general adult medical examination without abnormal findings: Secondary | ICD-10-CM

## 2021-11-18 NOTE — Progress Notes (Addendum)
Subjective:   Max [name] is a 70 y.o. male who presents for Medicare Annual/Subsequent preventive examination.  Review of Systems     Cardiac Risk Factors include: advanced age (>55men, >25 women);hypertension;male gender;dyslipidemia     Objective:    Today's Vitals   11/18/21 0807  BP: 124/78  Pulse: 95  Temp: 98.2 F (36.8 C)  SpO2: 95%  Weight: 205 lb 3.2 oz (93.1 kg)   Body mass index is 27.83 kg/m.  Advanced Directives 11/18/2021 11/12/2020 12/12/2019 12/11/2019 08/19/2019 02/29/2016 02/26/2016  Does Patient Have a Medical Advance Directive? No Yes No No Yes No No  Type of Advance Directive - [place];Living will - - Living will;Healthcare Power of Attorney - -  Does patient want to make changes to medical advance directive? - - - - No - Patient declined - -  Copy of [place] in Chart? - No - copy requested - - No - copy requested - -  Would patient like information on creating a medical advance directive? Yes (MAU/Ambulatory/Procedural Areas - Information given) - No - Patient declined - - - -  Pre-existing out of facility DNR order (yellow form or pink MOST form) - - - - - - -    Current Medications (verified) Outpatient Encounter Medications as of 11/18/2021  Medication Sig   acyclovir (ZOVIRAX) 800 MG tablet Take 1 tablet (800 mg total) by mouth 3 (three) times daily as needed. For outbreaks   Ascorbic Acid (VITAMIN C PO) Take 1 tablet by mouth daily.   aspirin 81 MG chewable tablet Chew by mouth daily.   Cholecalciferol (VITAMIN D3 PO) Take by mouth.   folic acid (FOLVITE) 026 MCG tablet Take 400 mcg by mouth daily.   methotrexate (RHEUMATREX) 2.5 MG tablet TAKE 4 TABLETS BY MOUTH WEEKLY   Multiple Vitamin (MULTIVITAMIN) tablet Take 1 tablet by mouth daily.   omeprazole (PRILOSEC) 20 MG capsule TAKE 1 CAPSULE BY MOUTH EVERY DAY   sildenafil (REVATIO) 20 MG tablet Take 1 to 2 tabs 2 - 3 hours before sex   verapamil  (CALAN-SR) 180 MG CR tablet TAKE 1 TABLET BY MOUTH EVERY DAY   No facility-administered encounter medications on file as of 11/18/2021.    Allergies (verified) Vicodin [hydrocodone-acetaminophen]   History: Past Medical History:  Diagnosis Date   Abdominal hernia 11/28/2015   Allergic rhinitis    Anxiety    BPV (benign positional vertigo), right 05/11/2018   Cardiac arrhythmia    ED (erectile dysfunction)    Herpes    History of syncope    in the setting of anxiety per cardiology note   Hypertension    Kidney tumor (benign), right    sx 02/27/2015   Meningioma ([place])    had surgery    Paroxysmal atrial fibrillation ([place]) 07/15/2012   a) CHADSVASc score 1b) On full-dose ASA and Verapamil   RA (rheumatoid arthritis) ([place])    Seizures ([place]) 05/14/2011   "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    Vasovagal syncope    Past Surgical History:  Procedure Laterality Date   BRAIN MENINGIOMA EXCISION  2012   CATARACT EXTRACTION W/ INTRAOCULAR LENS IMPLANT  ~ 2001   right   COLONOSCOPY  2008   [place]   right; "hit w/baseball; eye hemorrhaged"   [place]  ~ 2003   ROBOTIC ASSITED PARTIAL NEPHRECTOMY Right 02/29/2016   Procedure: XI ROBOTIC ASSITED PARTIAL NEPHRECTOMY;  Surgeon: Alexis Frock, MD;  Location: [facility];  Service: Urology;  Laterality: Right;   NEEDS ULTRASOUND   Family History  Problem Relation Age of Onset   Depression Other    Hypertension Other    GI problems Other    Lung disease Other    Hypertension Brother    Prostate cancer Paternal Grandfather    Healthy Son    Colon cancer Neg Hx    Esophageal cancer Neg Hx    Ulcerative colitis Neg Hx    Social History   Socioeconomic History   Marital status: Married    Spouse name: Not on file   Number of children: 1   Years of education: Not on file   Highest education level: Not on file  Occupational History    Comment: [employer]   Tobacco Use    Smoking status: Never   Smokeless tobacco: Never  Vaping Use   Vaping Use: Never used  Substance and Sexual Activity   Alcohol use: No   Drug use: No   Sexual activity: Yes  Other Topics Concern   Not on file  Social History Narrative   Regular Exercise   Social Determinants of Health   Financial Resource Strain: Low Risk    Difficulty of Paying Living Expenses: Not hard at all  Food Insecurity: No Food Insecurity   Worried About Charity fundraiser in the Last Year: Never true   [place] in the Last Year: Never true  Transportation Needs: No Transportation Needs   Lack of Transportation (Medical): No   Lack of Transportation (Non-Medical): No  Physical Activity: Inactive   Days of Exercise per Week: 0 days   Minutes of Exercise per Session: 0 min  Stress: No Stress Concern Present   Feeling of Stress : Not at all  Social Connections: Moderately Isolated   Frequency of Communication with Friends and Family: Once a week   Frequency of Social Gatherings with Friends and Family: More than three times a week   Attends Religious Services: Never   Marine scientist or Organizations: No   Attends Music therapist: Never   Marital Status: Married    Tobacco Counseling Counseling given: Not Answered   Clinical Intake:  Pre-visit preparation completed: Yes  Pain : No/denies pain     BMI - recorded: 27.83 Nutritional Status: BMI 25 -29 Overweight Nutritional Risks: None Diabetes: No  How often do you need to have someone help you when you read instructions, pamphlets, or other written materials from your doctor or pharmacy?: 1 - Never  Diabetic?no  Interpreter Needed?: No  Information entered by :: Charlott Rakes, LPN   Activities of Daily Living In your present state of health, do you have any difficulty performing the following activities: 11/18/2021  Hearing? N  Vision? N  Difficulty concentrating or making decisions? N  Walking or  climbing stairs? N  Dressing or bathing? N  Doing errands, shopping? N  Preparing Food and eating ? N  Using the Toilet? N  In the past six months, have you accidently leaked urine? N  Do you have problems with loss of bowel control? N  Managing your Medications? N  Managing your Finances? N  Housekeeping or managing your Housekeeping? N  Some recent data might be hidden    Patient Care Team: Vivi Barrack, MD as PCP - General (Family Medicine) Leeroy Cha, MD as Consulting Physician (Neurosurgery) Marshell Garfinkel, MD as Consulting  Physician (Pulmonary Disease) Hortencia Pilar, MD as Consulting Physician (Ophthalmology)  Indicate any recent Medical Services you may have received from other than [facility] providers in the past year (date may be approximate).     Assessment:   This is a routine wellness examination for [place].  Hearing/Vision screen Hearing Screening - Comments:: Pt denies any hearing issues  Vision Screening - Comments:: Pt follows up with Dr Hetty Blend R[facility] Harvest r at [facility] for annual eye exams   Dietary issues and exercise activities discussed: Current Exercise Habits: The patient has a physically strenuous job, but has no regular exercise apart from work.   Goals Addressed             This Visit's Progress    Patient Stated       Keep living        Depression Screen PHQ 2/9 Scores 11/18/2021 11/12/2020 07/06/2020 08/19/2019 01/28/2019 05/11/2018 03/30/2017  PHQ - 2 Score 0 0 0 0 0 0 0  PHQ- 9 Score - - 0 - - - -    Fall Risk Fall Risk  11/18/2021 11/12/2020 03/14/2020 08/19/2019 05/11/2018  Falls in the past year? 0 0 0 0 No  Number falls in past yr: 0 0 0 0 -  Injury with Fall? 0 0 0 0 -  Risk for fall due to : Impaired vision;Impaired balance/gait Impaired vision - - -  Risk for fall due to: Comment knees give out at times - - - -  Follow up Falls prevention discussed Falls prevention discussed - Education provided -    FALL RISK PREVENTION  PERTAINING TO THE HOME:  Any stairs in or around the home? Yes  If so, are there any without handrails? No  Home free of loose throw rugs in walkways, pet beds, electrical cords, etc? Yes  Adequate lighting in your home to reduce risk of falls? Yes   ASSISTIVE DEVICES UTILIZED TO PREVENT FALLS:  Life alert? No  Use of a cane, walker or w/c? No  Grab bars in the bathroom? Yes  Shower chair or bench in shower? Yes  Elevated toilet seat or a handicapped toilet? Yes   TIMED UP AND GO:  Was the test performed? Yes .  Length of time to ambulate 10 feet: 10 sec.   Gait steady and fast without use of assistive device  Cognitive Function:     6CIT Screen 11/18/2021 11/12/2020 08/19/2019  What Year? 0 points 0 points 0 points  What month? 0 points 0 points 0 points  What time? 0 points - 0 points  Count back from 20 0 points 0 points 0 points  Months in reverse 0 points 0 points 0 points  Repeat phrase 0 points 0 points 0 points  Total Score 0 - 0    Immunizations Immunization History  Administered Date(s) Administered   Fluad Quad(high Dose 65+) 11/03/2020   Influenza Split 12/18/2011   Influenza Whole 10/18/2009, 10/22/2010   Influenza, High Dose Seasonal PF 11/28/2015   Influenza, Seasonal, Injecte, Preservative Fre 12/20/2012   Influenza,inj,Quad PF,6+ Mos 12/21/2013, 02/08/2015   Influenza-Unspecified 10/10/2018, 11/01/2021   PFIZER Comirnaty(Gray Top)Covid-19 Tri-Sucrose Vaccine 06/30/2021   PFIZER(Purple Top)SARS-COV-2 Vaccination 02/05/2020, 02/28/2020, 11/03/2020   Pneumococcal Conjugate-13 03/30/2017   Pneumococcal Polysaccharide-23 03/01/2016   Td 12/15/1998, 10/18/2009      Flu Vaccine status: Up to date  Pneumococcal vaccine status: Up to date  Covid-19 vaccine status: Completed vaccines  Qualifies for Shingles Vaccine? Yes   Zostavax completed No  Shingrix Completed?: No.    Education has been provided regarding the importance of this vaccine. Patient  has been advised to call insurance company to determine out of pocket expense if they have not yet received this vaccine. Advised may also receive vaccine at local pharmacy or Health Dept. Verbalized acceptance and understanding.  Screening Tests Health Maintenance  Topic Date Due   Zoster Vaccines- Shingrix (1 of 2) Never done   COVID-19 Vaccine (5 - Booster for Pfizer series) 08/25/2021   COLONOSCOPY (Pts 45-92yrs Insurance coverage will need to be confirmed)  04/16/2028   Pneumonia Vaccine 72+ Years old  Completed   INFLUENZA VACCINE  Completed   Hepatitis C Screening  Completed   HPV VACCINES  Aged Out   TETANUS/TDAP  Discontinued    Health Maintenance  Health Maintenance Due  Topic Date Due   Zoster Vaccines- Shingrix (1 of 2) Never done   COVID-19 Vaccine (5 - Booster for Pfizer series) 08/25/2021    Colorectal cancer screening: Type of screening: Colonoscopy. Completed 04/16/18. Repeat every 10 years  Additional Screening:  Hepatitis C Screening:  Completed 05/06/21  Vision Screening: Recommended annual ophthalmology exams for early detection of glaucoma and other disorders of the eye. Is the patient up to date with their annual eye exam?  Yes  Who is the provider or what is the name of the office in which the patient attends annual eye exams? [facility]  If pt is not established with a provider, would they like to be referred to a provider to establish care? No .   Dental Screening: Recommended annual dental exams for proper oral hygiene  Community Resource Referral / Chronic Care Management: CRR required this visit?  No   CCM required this visit?  No      Plan:     I have personally reviewed and noted the following in the patient's chart:   Medical and social history Use of alcohol, tobacco or illicit drugs  Current medications and supplements including opioid prescriptions. Patient is not currently taking opioid prescriptions. Functional ability  and status Nutritional status Physical activity Advanced directives List of other physicians Hospitalizations, surgeries, and ER visits in previous 12 months Vitals Screenings to include cognitive, depression, and falls Referrals and appointments  In addition, I have reviewed and discussed with patient certain preventive protocols, quality metrics, and best practice recommendations. A written personalized care plan for preventive services as well as general preventive health recommendations were provided to patient.     Willette Brace, LPN   [id number]   Nurse Notes: None

## 2021-11-18 NOTE — Patient Instructions (Signed)
Max [name] , Thank you for taking time to come for your Medicare Wellness Visit. I appreciate your ongoing commitment to your health goals. Please review the following plan we discussed and let me know if I can assist you in the future.   Screening recommendations/referrals: Colonoscopy: Done 04/16/18 repeat every 10 years  Recommended yearly ophthalmology/optometry visit for glaucoma screening and checkup Recommended yearly dental visit for hygiene and checkup  Vaccinations: Influenza vaccine: Done 11//18/22 repeat every year  Pneumococcal vaccine: Up to date Tdap vaccine: Due Shingles vaccine: Shingrix discussed. Please contact your pharmacy for coverage information.    Covid-19: Completed 2/21, 3/16, 11/03/20 & 06/30/21  Advanced directives: Advance directive discussed with you today. I have provided a copy for you to complete at home and have notarized. Once this is complete please bring a copy in to our office so we can scan it into your chart.  Conditions/risks identified: Keep Living   Next appointment: Follow up in one year for your annual wellness visit.   Preventive Care 29 Years and Older, Male Preventive care refers to lifestyle choices and visits with your health care provider that can promote health and wellness. What does preventive care include? A yearly physical exam. This is also called an annual well check. Dental exams once or twice a year. Routine eye exams. Ask your health care provider how often you should have your eyes checked. Personal lifestyle choices, including: Daily care of your teeth and gums. Regular physical activity. Eating a healthy diet. Avoiding tobacco and drug use. Limiting alcohol use. Practicing safe sex. Taking low doses of aspirin every day. Taking vitamin and mineral supplements as recommended by your health care provider. What happens during an annual well check? The services and screenings done by your health care provider during your  annual well check will depend on your age, overall health, lifestyle risk factors, and family history of disease. Counseling  Your health care provider may ask you questions about your: Alcohol use. Tobacco use. Drug use. Emotional well-being. Home and relationship well-being. Sexual activity. Eating habits. History of falls. Memory and ability to understand (cognition). Work and work Statistician. Screening  You may have the following tests or measurements: Height, weight, and BMI. Blood pressure. Lipid and cholesterol levels. These may be checked every 5 years, or more frequently if you are over 31 years old. Skin check. Lung cancer screening. You may have this screening every year starting at age 78 if you have a 30-pack-year history of smoking and currently smoke or have quit within the past 15 years. Fecal occult blood test (FOBT) of the stool. You may have this test every year starting at age 34. Flexible sigmoidoscopy or colonoscopy. You may have a sigmoidoscopy every 5 years or a colonoscopy every 10 years starting at age 39. Prostate cancer screening. Recommendations will vary depending on your family history and other risks. Hepatitis C blood test. Hepatitis B blood test. Sexually transmitted disease (STD) testing. Diabetes screening. This is done by checking your blood sugar (glucose) after you have not eaten for a while (fasting). You may have this done every 1-3 years. Abdominal aortic aneurysm (AAA) screening. You may need this if you are a current or former smoker. Osteoporosis. You may be screened starting at age 44 if you are at high risk. Talk with your health care provider about your test results, treatment options, and if necessary, the need for more tests. Vaccines  Your health care provider may recommend certain vaccines, such as:  Influenza vaccine. This is recommended every year. Tetanus, diphtheria, and acellular pertussis (Tdap, Td) vaccine. You may need a Td  booster every 10 years. Zoster vaccine. You may need this after age 25. Pneumococcal 13-valent conjugate (PCV13) vaccine. One dose is recommended after age 81. Pneumococcal polysaccharide (PPSV23) vaccine. One dose is recommended after age 56. Talk to your health care provider about which screenings and vaccines you need and how often you need them. This information is not intended to replace advice given to you by your health care provider. Make sure you discuss any questions you have with your health care provider. Document Released: 12/28/2015 Document Revised: 08/20/2016 Document Reviewed: 10/02/2015 Elsevier Interactive Patient Education  2017 [place] Prevention in the Home Falls can cause injuries. They can happen to people of all ages. There are many things you can do to make your home safe and to help prevent falls. What can I do on the outside of my home? Regularly fix the edges of walkways and driveways and fix any cracks. Remove anything that might make you trip as you walk through a door, such as a raised step or threshold. Trim any bushes or trees on the path to your home. Use bright outdoor lighting. Clear any walking paths of anything that might make someone trip, such as rocks or tools. Regularly check to see if handrails are loose or broken. Make sure that both sides of any steps have handrails. Any raised decks and porches should have guardrails on the edges. Have any leaves, snow, or ice cleared regularly. Use sand or salt on walking paths during winter. Clean up any spills in your garage right away. This includes oil or grease spills. What can I do in the bathroom? Use night lights. Install grab bars by the toilet and in the tub and shower. Do not use towel bars as grab bars. Use non-skid mats or decals in the tub or shower. If you need to sit down in the shower, use a plastic, non-slip stool. Keep the floor dry. Clean up any water that spills on the floor  as soon as it happens. Remove soap buildup in the tub or shower regularly. Attach bath mats securely with double-sided non-slip rug tape. Do not have throw rugs and other things on the floor that can make you trip. What can I do in the bedroom? Use night lights. Make sure that you have a light by your bed that is easy to reach. Do not use any sheets or blankets that are too big for your bed. They should not hang down onto the floor. Have a firm chair that has side arms. You can use this for support while you get dressed. Do not have throw rugs and other things on the floor that can make you trip. What can I do in the kitchen? Clean up any spills right away. Avoid walking on wet floors. Keep items that you use a lot in easy-to-reach places. If you need to reach something above you, use a strong step stool that has a grab bar. Keep electrical cords out of the way. Do not use floor polish or wax that makes floors slippery. If you must use wax, use non-skid floor wax. Do not have throw rugs and other things on the floor that can make you trip. What can I do with my stairs? Do not leave any items on the stairs. Make sure that there are handrails on both sides of the stairs and use them.  Fix handrails that are broken or loose. Make sure that handrails are as long as the stairways. Check any carpeting to make sure that it is firmly attached to the stairs. Fix any carpet that is loose or worn. Avoid having throw rugs at the top or bottom of the stairs. If you do have throw rugs, attach them to the floor with carpet tape. Make sure that you have a light switch at the top of the stairs and the bottom of the stairs. If you do not have them, ask someone to add them for you. What else can I do to help prevent falls? Wear shoes that: Do not have high heels. Have rubber bottoms. Are comfortable and fit you well. Are closed at the toe. Do not wear sandals. If you use a stepladder: Make sure that it is  fully opened. Do not climb a closed stepladder. Make sure that both sides of the stepladder are locked into place. Ask someone to hold it for you, if possible. Clearly mark and make sure that you can see: Any grab bars or handrails. First and last steps. Where the edge of each step is. Use tools that help you move around (mobility aids) if they are needed. These include: Canes. Walkers. Scooters. Crutches. Turn on the lights when you go into a dark area. Replace any light bulbs as soon as they burn out. Set up your furniture so you have a clear path. Avoid moving your furniture around. If any of your floors are uneven, fix them. If there are any pets around you, be aware of where they are. Review your medicines with your doctor. Some medicines can make you feel dizzy. This can increase your chance of falling. Ask your doctor what other things that you can do to help prevent falls. This information is not intended to replace advice given to you by your health care provider. Make sure you discuss any questions you have with your health care provider. Document Released: 09/27/2009 Document Revised: 05/08/2016 Document Reviewed: 01/05/2015 Elsevier Interactive Patient Education  2017 [employer].

## 2021-12-17 ENCOUNTER — Ambulatory Visit (INDEPENDENT_AMBULATORY_CARE_PROVIDER_SITE_OTHER): Payer: Medicare HMO | Admitting: Family Medicine

## 2021-12-17 ENCOUNTER — Encounter: Payer: Self-pay | Admitting: Family Medicine

## 2021-12-17 ENCOUNTER — Other Ambulatory Visit: Payer: Self-pay

## 2021-12-17 VITALS — BP 140/82 | HR 52 | Temp 97.5°F | Ht 72.0 in | Wt 206.0 lb

## 2021-12-17 DIAGNOSIS — Z125 Encounter for screening for malignant neoplasm of prostate: Secondary | ICD-10-CM | POA: Diagnosis not present

## 2021-12-17 DIAGNOSIS — E785 Hyperlipidemia, unspecified: Secondary | ICD-10-CM | POA: Diagnosis not present

## 2021-12-17 DIAGNOSIS — L570 Actinic keratosis: Secondary | ICD-10-CM

## 2021-12-17 DIAGNOSIS — R739 Hyperglycemia, unspecified: Secondary | ICD-10-CM | POA: Diagnosis not present

## 2021-12-17 DIAGNOSIS — I10 Essential (primary) hypertension: Secondary | ICD-10-CM | POA: Diagnosis not present

## 2021-12-17 DIAGNOSIS — I480 Paroxysmal atrial fibrillation: Secondary | ICD-10-CM

## 2021-12-17 DIAGNOSIS — M06041 Rheumatoid arthritis without rheumatoid factor, right hand: Secondary | ICD-10-CM

## 2021-12-17 DIAGNOSIS — M06042 Rheumatoid arthritis without rheumatoid factor, left hand: Secondary | ICD-10-CM

## 2021-12-17 DIAGNOSIS — Z0001 Encounter for general adult medical examination with abnormal findings: Secondary | ICD-10-CM

## 2021-12-17 LAB — CBC
HCT: 45 % (ref 39.0–52.0)
Hemoglobin: 15.1 g/dL (ref 13.0–17.0)
MCHC: 33.5 g/dL (ref 30.0–36.0)
MCV: 102.7 fl — ABNORMAL HIGH (ref 78.0–100.0)
Platelets: 326 10*3/uL (ref 150.0–400.0)
RBC: 4.39 Mil/uL (ref 4.22–5.81)
RDW: 14.7 % (ref 11.5–15.5)
WBC: 4.5 10*3/uL (ref 4.0–10.5)

## 2021-12-17 LAB — COMPREHENSIVE METABOLIC PANEL
ALT: 18 U/L (ref 0–53)
AST: 17 U/L (ref 0–37)
Albumin: 4.3 g/dL (ref 3.5–5.2)
Alkaline Phosphatase: 75 U/L (ref 39–117)
BUN: 20 mg/dL (ref 6–23)
CO2: 25 mEq/L (ref 19–32)
Calcium: 9.5 mg/dL (ref 8.4–10.5)
Chloride: 104 mEq/L (ref 96–112)
Creatinine, Ser: 1.09 mg/dL (ref 0.40–1.50)
GFR: 68.4 mL/min (ref >60.00)
Glucose, Bld: 94 mg/dL (ref 70–99)
Potassium: 4.7 mEq/L (ref 3.5–5.1)
Sodium: 139 mEq/L (ref 135–145)
Total Bilirubin: 0.8 mg/dL (ref 0.2–1.2)
Total Protein: 7 g/dL (ref 6.0–8.3)

## 2021-12-17 LAB — LIPID PANEL
Cholesterol: 184 mg/dL (ref 0–200)
HDL: 55.7 mg/dL (ref >39.00)
LDL Cholesterol: 113 mg/dL — ABNORMAL HIGH (ref 0–99)
NonHDL: 128.37
Total CHOL/HDL Ratio: 3
Triglycerides: 76 mg/dL (ref 0.0–149.0)
VLDL: 15.2 mg/dL (ref 0.0–40.0)

## 2021-12-17 LAB — FOLATE: Folate: >24.2 ng/mL (ref >5.9)

## 2021-12-17 LAB — HEMOGLOBIN A1C: Hgb A1c MFr Bld: 6.1 % (ref 4.6–6.5)

## 2021-12-17 LAB — PSA: PSA: 2.85 ng/mL (ref 0.10–4.00)

## 2021-12-17 LAB — TSH: TSH: 4.42 u[IU]/mL (ref 0.35–5.50)

## 2021-12-17 LAB — VITAMIN B12: Vitamin B-12: 358 pg/mL (ref 211–911)

## 2021-12-17 NOTE — Patient Instructions (Signed)
It was very nice to see you today!  We froze the spot on your forehead today.  We will check blood work today.  You probably have a small meniscal tear in your knee.  Please use compression sleeve if needed.  Continue working on lower body exercises.  Please let me know if this becomes more of an issue.  I will see back in year for your next physical.  Come back to see me sooner if needed.  Take care, Dr [name]  PLEASE NOTE:  If you had any lab tests please let us know if you have not heard back within a few days. You may see your results on mychart before we have a chance to review them but we will give you a call once they are reviewed by [place]. If we ordered any referrals today, please let us know if you have not heard from their office within the next week.   Please try these tips to maintain a healthy lifestyle:  Eat at least 3 REAL meals and 1-2 snacks per day.  Aim for no more than 5 hours between eating.  If you eat breakfast, please do so within one hour of getting up.   Each meal should contain half fruits/vegetables, one quarter protein, and one quarter carbs (no bigger than a computer mouse)  Cut down on sweet beverages. This includes juice, soda, and sweet tea.   Drink at least 1 glass of water with each meal and aim for at least 8 glasses per day  Exercise at least 150 minutes every week.    Preventive Care 11 Years and Older, Male Preventive care refers to lifestyle choices and visits with your health care provider that can promote health and wellness. Preventive care visits are also called wellness exams. What can I expect for my preventive care visit? Counseling During your preventive care visit, your health care provider may ask about your: Medical history, including: Past medical problems. Family medical history. History of falls. Current health, including: Emotional well-being. Home life and relationship well-being. Sexual activity. Memory and ability to  understand (cognition). Lifestyle, including: Alcohol, nicotine or tobacco, and drug use. Access to firearms. Diet, exercise, and sleep habits. Work and work Statistician. Sunscreen use. Safety issues such as seatbelt and bike helmet use. Physical exam Your health care provider will check your: Height and weight. These may be used to calculate your BMI (body mass index). BMI is a measurement that tells if you are at a healthy weight. Waist circumference. This measures the distance around your waistline. This measurement also tells if you are at a healthy weight and may help predict your risk of certain diseases, such as type 2 diabetes and high blood pressure. Heart rate and blood pressure. Body temperature. Skin for abnormal spots. What immunizations do I need? Vaccines are usually given at various ages, according to a schedule. Your health care provider will recommend vaccines for you based on your age, medical history, and lifestyle or other factors, such as travel or where you work. What tests do I need? Screening Your health care provider may recommend screening tests for certain conditions. This may include: Lipid and cholesterol levels. Diabetes screening. This is done by checking your blood sugar (glucose) after you have not eaten for a while (fasting). Hepatitis C test. Hepatitis B test. HIV (human immunodeficiency virus) test. STI (sexually transmitted infection) testing, if you are at risk. Lung cancer screening. Colorectal cancer screening. Prostate cancer screening. Abdominal aortic aneurysm (AAA) screening.  You may need this if you are a current or former smoker. Talk with your health care provider about your test results, treatment options, and if necessary, the need for more tests. Follow these instructions at home: Eating and drinking  Eat a diet that includes fresh fruits and vegetables, whole grains, lean protein, and low-fat dairy products. Limit your intake of  foods with high amounts of sugar, saturated fats, and salt. Take vitamin and mineral supplements as recommended by your health care provider. Do not drink alcohol if your health care provider tells you not to drink. If you drink alcohol: Limit how much you have to 0-2 drinks a day. Know how much alcohol is in your drink. In the [place] one drink equals one 12 oz bottle of beer (355 mL), one 5 oz glass of wine (148 mL), or one 1 oz glass of hard liquor (44 mL). Lifestyle Brush your teeth every morning and night with fluoride toothpaste. Floss one time each day. Exercise for at least 30 minutes 5 or more days each week. Do not use any products that contain nicotine or tobacco. These products include cigarettes, chewing tobacco, and vaping devices, such as e-cigarettes. If you need help quitting, ask your health care provider. Do not use drugs. If you are sexually active, practice safe sex. Use a condom or other form of protection to prevent STIs. Take aspirin only as told by your health care provider. Make sure that you understand how much to take and what form to take. Work with your health care provider to find out whether it is safe and beneficial for you to take aspirin daily. Ask your health care provider if you need to take a cholesterol-lowering medicine (statin). Find healthy ways to manage stress, such as: Meditation, yoga, or listening to music. Journaling. Talking to a trusted person. Spending time with friends and family. Safety Always wear your seat belt while driving or riding in a vehicle. Do not drive: If you have been drinking alcohol. Do not ride with someone who has been drinking. When you are tired or distracted. While texting. If you have been using any mind-altering substances or drugs. Wear a helmet and other protective equipment during sports activities. If you have firearms in your house, make sure you follow all gun safety procedures. Minimize exposure to UV  radiation to reduce your risk of skin cancer. What's next? Visit your health care provider once a year for an annual wellness visit. Ask your health care provider how often you should have your eyes and teeth checked. Stay up to date on all vaccines. This information is not intended to replace advice given to you by your health care provider. Make sure you discuss any questions you have with your health care provider. Document Revised: 05/29/2021 Document Reviewed: 05/29/2021 Elsevier Patient Education  [place].

## 2021-12-17 NOTE — Progress Notes (Signed)
Chief Complaint:  Max [name] is a 71 y.o. male who presents today for his annual comprehensive physical exam.    Assessment/Plan:  New/Acute Problems: Left Knee Pain Likely on mild degenerative meniscal tear.  He does have some underlying osteoarthritis as well.  Reassuring exam today.  He has not had any issues for the last couple of months.  Recommended compression sleeve.  Recommended strengthening exercises.  He will let me know if symptoms worsen and we can consider referral to PT or sports med.  Chronic Problems Addressed Today: Rheumatoid arthritis ([place]) Follows with rheumatology.  He is on methotrexate 10 mg weekly.  We will check labs today.  He is on GI protection with Prilosec 20 mg daily and folic acid supplementation.    Essential hypertension At goal per JNC 8.  Continue verapamil 180 mg daily.  Paroxysmal atrial fibrillation ([facility]) Rate controlled on verapamil 180 mg daily.  On aspirin 81 mg daily.  He has denied anticoagulation.  Actinic keratosis Cryotherapy applied today.  See below procedure note.  He tolerated well.  Hyperglycemia Check A1c.  Dyslipidemia Check lipids.   Preventative Healthcare: Will get blood work done today. UTD on screenings. UTD on vaccines. Check PSA.  Patient Counseling(The following topics were reviewed and/or handout was given):  -Nutrition: Stressed importance of moderation in sodium/caffeine intake, saturated fat and cholesterol, caloric balance, sufficient intake of fresh fruits, vegetables, and fiber.  -Stressed the importance of regular exercise.   -Substance Abuse: Discussed cessation/primary prevention of tobacco, alcohol, or other drug use; driving or other dangerous activities under the influence; availability of treatment for abuse.   -Injury prevention: Discussed safety belts, safety helmets, smoke detector, smoking near bedding or upholstery.   -Sexuality: Discussed sexually transmitted diseases, partner selection,  use of condoms, avoidance of unintended pregnancy and contraceptive alternatives.   -Dental health: Discussed importance of regular tooth brushing, flossing, and dental visits.  -Health maintenance and immunizations reviewed. Please refer to Health maintenance section.  Return to care in 1 year for next preventative visit.     Subjective:  HPI:  He has no acute complaints today.   Since his last visit he saw a rheumatologist. He have hx of rheumatoid arthritis. They would like him to get blood work done today.  He is here with left knee pain. He reports he was getting in the car when his knee buckled. This happened about 6 months ago. He notes he has not had any issue since then. He has not tried any treatments for this issue.  Denies popping or locking sensation. No pain, swelling or stiffness.   He would also like to check the spot on his head today.    Lifestyle Diet: None specific. Exercise: Trying to stay active.   Depression screen PHQ 2/9 11/18/2021  Decreased Interest 0  Down, Depressed, Hopeless 0  PHQ - 2 Score 0  Altered sleeping -  Tired, decreased energy -  Change in appetite -  Feeling bad or failure about yourself  -  Trouble concentrating -  Moving slowly or fidgety/restless -  Suicidal thoughts -  PHQ-9 Score -    There are no preventive care reminders to display for this patient.    ROS: Per HPI, otherwise a complete review of systems was negative.   PMH:  The following were reviewed and entered/updated in epic: Past Medical History:  Diagnosis Date   Abdominal hernia 11/28/2015   Allergic rhinitis    Anxiety    BPV (benign  positional vertigo), right 05/11/2018   Cardiac arrhythmia    ED (erectile dysfunction)    Herpes    History of syncope    in the setting of anxiety per cardiology note   Hypertension    Kidney tumor (benign), right    sx 02/27/2015   Meningioma ([place])    had surgery    Paroxysmal atrial fibrillation ([place]) 07/15/2012   a)  CHADSVASc score 1b) On full-dose ASA and Verapamil   RA (rheumatoid arthritis) (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   Essential hypertension 08/26/2007   Past Surgical History:  Procedure Laterality Date   BRAIN MENINGIOMA EXCISION  2012   CATARACT EXTRACTION W/ INTRAOCULAR LENS IMPLANT  ~ 2001   right   COLONOSCOPY  2008   EYE SURGERY  1964   right; "hit w/baseball; eye hemorrhaged"   [place]  ~ 2003   ROBOTIC ASSITED PARTIAL NEPHRECTOMY Right 02/29/2016   Procedure: XI ROBOTIC ASSITED PARTIAL NEPHRECTOMY;  Surgeon: Alexis Frock, MD;  Location: [facility];  Service: Urology;  Laterality: Right;   NEEDS ULTRASOUND    Family History  Problem Relation Age of Onset   Depression Other    Hypertension Other    GI problems Other    Lung disease Other    Hypertension Brother    Prostate cancer Paternal Grandfather    Healthy Son    Colon cancer Neg Hx    Esophageal cancer Neg Hx    Ulcerative colitis Neg Hx     Medications- reviewed and updated Current Outpatient Medications  Medication Sig Dispense Refill   acyclovir (ZOVIRAX) 800 MG tablet Take 1 tablet (800 mg total) by mouth 3 (three) times daily as needed. For outbreaks 60 tablet 1   Ascorbic Acid (VITAMIN C PO) Take 1 tablet by mouth daily.     aspirin 81 MG chewable tablet Chew by mouth daily.     Cholecalciferol (VITAMIN D3 PO) Take by mouth.     folic acid (FOLVITE) 053 MCG tablet Take 400 mcg by mouth daily.      methotrexate (RHEUMATREX) 2.5 MG tablet TAKE 4 TABLETS BY MOUTH WEEKLY 90 tablet 3   Multiple Vitamin (MULTIVITAMIN) tablet Take 1 tablet by mouth daily.     omeprazole (PRILOSEC) 20 MG capsule TAKE 1 CAPSULE BY MOUTH EVERY DAY 90 capsule 3   sildenafil (REVATIO) 20 MG tablet Take 1 to 2 tabs 2 - 3 hours before sex 20 tablet 11   verapamil (CALAN-SR) 180 MG CR tablet TAKE 1 TABLET BY MOUTH EVERY DAY 90 tablet 0   No current facility-administered medications for this visit.    Allergies-reviewed and updated Allergies  Allergen Reactions   Vicodin [Hydrocodone-Acetaminophen] Nausea And Vomiting    Social History   Socioeconomic History   Marital status: Married    Spouse name: Not on file   Number of children: 1   Years of education: Not on file   Highest education level: Not on file  Occupational  History    Comment: [employer]   Tobacco Use   Smoking status: Never   Smokeless tobacco: Never  Vaping Use   Vaping Use: Never used  Substance and Sexual Activity   Alcohol use: No   Drug use: No   Sexual activity: Yes  Other Topics Concern   Not on file  Social History Narrative   Regular Exercise   Social Determinants of Health   Financial Resource Strain: Low Risk    Difficulty of Paying Living Expenses: Not hard at all  Food Insecurity: No Food Insecurity   Worried About Charity fundraiser in the Last Year: Never true   Ran Out of Food in the Last Year: Never true  Transportation Needs: No Transportation Needs   Lack of Transportation (Medical): No   Lack of Transportation (Non-Medical): No  Physical Activity: Inactive   Days of Exercise per Week: 0 days   Minutes of Exercise per Session: 0 min  Stress: No Stress Concern Present   Feeling of Stress : Not at all  Social Connections: Moderately Isolated   Frequency of Communication with Friends and Family: Once a week   Frequency of Social Gatherings with Friends and Family: More than three times a week    Attends Religious Services: Never   Marine scientist or Organizations: No   Attends Music therapist: Never   Marital Status: Married        Objective:  Physical Exam: BP 140/82    Pulse (!) 52    Temp (!) 97.5 F (36.4 C) (Temporal)    Ht 6' (1.829 m)    Wt 206 lb (93.4 kg)    SpO2 97%    BMI 27.94 kg/m   Body mass index is 27.94 kg/m. Wt Readings from Last 3 Encounters:  12/17/21 206 lb (93.4 kg)  11/18/21 205 lb 3.2 oz (93.1 kg)  08/29/21 209 lb (94.8 kg)   Gen: NAD, resting comfortably HEENT: TMs normal bilaterally. OP clear. No thyromegaly noted.  CV: RRR with no murmurs appreciated Pulm: NWOB, CTAB with no crackles, wheezes, or rhonchi GI: Normal bowel sounds present. Soft, Nontender, Nondistended. MSK: no edema, cyanosis, or clubbing noted - Left Knee: Mild crepitus with active and passive range of motion.  Stable to varus and valgus stress.  Anterior and posterior drawer signs negative. Skin: warm, dry.  2 separate actinic keratosis noted on anterior forehead. Neuro: CN2-12 grossly intact. Strength 5/5 in upper and lower extremities. Reflexes symmetric and intact bilaterally.  Psych: Normal affect and thought content  Cryotherapy Procedure Note  Pre-operative Diagnosis: Actinic keratosis  Locations: forehead  Indications: Therapeutic  Procedure Details  Patient informed of risks (permanent scarring, infection, light or dark discoloration, bleeding, infection, weakness, numbness and recurrence of the lesion) and benefits of the procedure and verbal informed consent obtained.  The areas are treated with liquid nitrogen therapy, frozen until ice ball extended 3 mm beyond lesion, allowed to thaw, and treated again.  A total of 2 lesions were treated.  The patient tolerated procedure well.  The patient was instructed on post-op care, warned that there may be blister formation, redness and pain. Recommend OTC analgesia as needed for  pain.  Condition: Stable  Complications: none.       I,Savera Zaman,acting as a Education administrator for Dimas Chyle, MD.,have documented all relevant documentation on the behalf of Dimas Chyle, MD,as directed by  Dimas Chyle, MD while in the presence of Dimas Chyle, MD.  I, Dimas Chyle, MD, have reviewed all documentation for this visit. The documentation on 12/17/21 for the exam, diagnosis, procedures, and orders are all accurate and complete.  Algis Greenhouse. [name], MD 12/17/2021 8:18 AM

## 2021-12-17 NOTE — Assessment & Plan Note (Signed)
Check A1c. 

## 2021-12-17 NOTE — Assessment & Plan Note (Signed)
Follows with rheumatology.  He is on methotrexate 10 mg weekly.  We will check labs today.  He is on GI protection with Prilosec 20 mg daily and folic acid supplementation.

## 2021-12-17 NOTE — Assessment & Plan Note (Signed)
Rate controlled on verapamil 180 mg daily.  On aspirin 81 mg daily.  He has denied anticoagulation.

## 2021-12-17 NOTE — Assessment & Plan Note (Signed)
Check lipids 

## 2021-12-17 NOTE — Assessment & Plan Note (Signed)
Cryotherapy applied today.  See below procedure note.  He tolerated well.

## 2021-12-17 NOTE — Assessment & Plan Note (Signed)
At goal per JNC 8.  Continue verapamil 180 mg daily.

## 2021-12-18 NOTE — Progress Notes (Signed)
Please inform patient of the following:  His A1c is up a bit since last time. Do not need to start medications for this but he should continue working on diet and exercise.   His cholesterol is also up a bit since last time. Recommend starting lipitor 40mg  daily to improve his cholesterol numbers and lower his risk of heart attack and stroke.   Everything else is NORMAL. We can recheck in year.  Algis Greenhouse. [name], MD 12/18/2021 8:34 AM

## 2021-12-23 ENCOUNTER — Other Ambulatory Visit: Payer: Self-pay | Admitting: *Deleted

## 2021-12-23 MED ORDER — ATORVASTATIN CALCIUM 40 MG PO TABS: 40.0000 mg | ORAL_TABLET | Freq: Every day | ORAL | 3 refills | Status: DC

## 2022-01-07 DIAGNOSIS — Z961 Presence of intraocular lens: Secondary | ICD-10-CM | POA: Diagnosis not present

## 2022-01-07 DIAGNOSIS — H25812 Combined forms of age-related cataract, left eye: Secondary | ICD-10-CM | POA: Diagnosis not present

## 2022-01-07 DIAGNOSIS — H35371 Puckering of macula, right eye: Secondary | ICD-10-CM | POA: Diagnosis not present

## 2022-01-07 DIAGNOSIS — H43811 Vitreous degeneration, right eye: Secondary | ICD-10-CM | POA: Diagnosis not present

## 2022-01-12 ENCOUNTER — Other Ambulatory Visit: Payer: Self-pay | Admitting: Family Medicine

## 2022-01-23 NOTE — Progress Notes (Signed)
Office Visit Note  Patient: Max [name]             Date of Birth: 07/30/1951           MRN: [id number]             PCP: Vivi Barrack, MD Referring: Vivi Barrack, MD Visit Date: 02/05/2022 Occupation: @GUAROCC @  Subjective:  Medication monitoring   History of Present Illness: Max [name] is a 71 y.o. male with history of seropositive rheumatoid arthritis.  Patient is currently taking methotrexate 4 tablets by mouth once weekly.  He is tolerating methotrexate without any side effects and has not missed any doses recently.  He denies any signs or symptoms of a rheumatoid arthritis flare.  He is not experiencing any joint pain, joint swelling, or joint stiffness.  He has not had any difficulty with ADLs.  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  He has not had any nocturnal pain and has been sleeping well at night.  He has not noticed any increased fatigue.  He denies any new medical conditions since his last office visit.  He has not had any recent infections.   Activities of Daily Living:  Patient reports morning stiffness for 0 minutes.   Patient Denies nocturnal pain.  Difficulty dressing/grooming: Denies Difficulty climbing stairs: Denies Difficulty getting out of chair: Denies Difficulty using hands for taps, buttons, cutlery, and/or writing: Denies  Review of Systems  Constitutional:  Negative for fatigue.  HENT:  Negative for mouth sores, mouth dryness and nose dryness.   Eyes:  Positive for dryness. Negative for pain and itching.  Respiratory:  Negative for shortness of breath and difficulty breathing.   Cardiovascular:  Negative for chest pain and palpitations.  Gastrointestinal:  Negative for blood in stool, constipation and diarrhea.  Endocrine: Negative for increased urination.  Genitourinary:  Negative for difficulty urinating.  Musculoskeletal:  Negative for joint pain, joint pain, joint swelling, myalgias, morning stiffness, muscle tenderness  and myalgias.  Skin:  Negative for color change, rash and redness.  Allergic/Immunologic: Negative for susceptible to infections.  Neurological:  Negative for dizziness, numbness, headaches, memory loss and weakness.  Hematological:  Negative for bruising/bleeding tendency.  Psychiatric/Behavioral:  Negative for confusion.    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   Essential hypertension 08/26/2007    Past Medical History:  Diagnosis Date   Abdominal hernia 11/28/2015   Allergic rhinitis    Anxiety    BPV (benign positional vertigo), right 05/11/2018   Cardiac arrhythmia    ED (erectile dysfunction)    Herpes    History of syncope    in the setting of anxiety per cardiology note   Hypertension    Kidney tumor (benign), right    sx 02/27/2015   Meningioma ([place])    had surgery    Paroxysmal atrial fibrillation ([place]) 07/15/2012   a) CHADSVASc score 1b) On full-dose ASA and Verapamil   RA (rheumatoid arthritis) ([place])    Seizures ([place]) 05/14/2011   "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    Vasovagal syncope     Family History  Problem Relation Age of Onset   Depression Other    Hypertension Other    GI problems Other    Lung disease Other    Hypertension Brother    Prostate cancer Paternal Grandfather    Healthy Son    Colon cancer Neg Hx    Esophageal cancer Neg Hx    Ulcerative colitis Neg Hx    Past Surgical History:  Procedure Laterality Date   BRAIN MENINGIOMA EXCISION  2012   CATARACT EXTRACTION W/ INTRAOCULAR LENS IMPLANT  ~ 2001   right   COLONOSCOPY  2008   EYE SURGERY  1964    right; "hit w/baseball; eye hemorrhaged"   [place]  ~ 2003   ROBOTIC ASSITED PARTIAL NEPHRECTOMY Right 02/29/2016   Procedure: XI ROBOTIC ASSITED PARTIAL NEPHRECTOMY;  Surgeon: Alexis Frock, MD;  Location: [facility];  Service: Urology;  Laterality: Right;   NEEDS ULTRASOUND   Social History   Social History Narrative   Regular Exercise   Immunization History  Administered Date(s) Administered   Fluad Quad(high Dose 65+) 11/03/2020   Influenza Split 12/18/2011   Influenza Whole 10/18/2009, 10/22/2010   Influenza, High Dose Seasonal PF 11/28/2015   Influenza, Seasonal, Injecte, Preservative Fre 12/20/2012   Influenza,inj,Quad PF,6+ Mos 12/21/2013, 02/08/2015   Influenza-Unspecified 10/10/2018, 11/01/2021   PFIZER Comirnaty(Gray Top)Covid-19 Tri-Sucrose Vaccine 06/30/2021   PFIZER(Purple Top)SARS-COV-2 Vaccination 02/05/2020, 02/28/2020, 11/03/2020   Pneumococcal Conjugate-13 03/30/2017   Pneumococcal Polysaccharide-23 03/01/2016   Td 12/15/1998, 10/18/2009     Objective: Vital Signs: BP (!) 163/93 (BP Location: Left Arm, Patient Position: Sitting, Cuff Size: Normal)    Pulse (!) 108    Ht 6' (1.829 m)    Wt 206 lb 12.8 oz (93.8 kg)    BMI 28.05 kg/m    Physical Exam Vitals and nursing note reviewed.  Constitutional:      Appearance: He is well-developed.  HENT:     Head: Normocephalic and atraumatic.  Eyes:     Conjunctiva/sclera: Conjunctivae normal.     Pupils: Pupils are equal, round, and reactive to light.  Cardiovascular:     Rate and Rhythm: Normal rate and regular rhythm.     Heart sounds: Normal heart sounds.  Pulmonary:     Effort: Pulmonary effort is normal.     Breath sounds: Normal breath sounds.  Abdominal:     General: Bowel sounds are normal.     Palpations: Abdomen is soft.  Musculoskeletal:     Cervical back: Normal range of motion and neck supple.  Skin:    General: Skin is warm and dry.     Capillary Refill:  Capillary refill takes less than 2 seconds.  Neurological:     Mental Status: He is alert and oriented to person, place, and time.  Psychiatric:        Behavior: Behavior normal.     Musculoskeletal Exam: C-spine, thoracic spine, lumbar spine have good range of motion.  No midline spinal tenderness or SI joint tenderness.  Shoulder joints, elbow joints, wrist joints, MCPs, PIPs, DIPs have good range of motion with no synovitis.  PIP and DIP thickening consistent with osteoarthritis of both hands.  Complete fist formation bilaterally.  Hip joints have good range of motion with no groin pain.  Knee joints have good range of motion with no warmth or effusion.  Ankle joints have good range of motion with no tenderness or joint swelling.  Calcification at the base of the right  Achilles tendon.  CDAI Exam: CDAI Score: 0  Patient Global: 0 mm; Provider Global: 0 mm Swollen: 0 ; Tender: 0  Joint Exam 02/05/2022   No joint exam has been documented for this visit   There is currently no information documented on the homunculus. Go to the Rheumatology activity and complete the homunculus joint exam.  Investigation: No additional findings.  Imaging: No results found.  Recent Labs: Lab Results  Component Value Date   WBC 4.5 12/17/2021   HGB 15.1 12/17/2021   PLT 326.0 12/17/2021   NA 139 12/17/2021   K 4.7 12/17/2021   CL 104 12/17/2021   CO2 25 12/17/2021   GLUCOSE 94 12/17/2021   BUN 20 12/17/2021   CREATININE 1.09 12/17/2021   BILITOT 0.8 12/17/2021   ALKPHOS 75 12/17/2021   AST 17 12/17/2021   ALT 18 12/17/2021   PROT 7.0 12/17/2021   ALBUMIN 4.3 12/17/2021   CALCIUM 9.5 12/17/2021   GFRAA 76 05/06/2021   QFTBGOLDPLUS NEGATIVE 05/06/2021    Speciality Comments: Dxd 2011. MTX x11 yrs. off and on.  Procedures:  No procedures performed Allergies: Vicodin [hydrocodone-acetaminophen]   Assessment / Plan:     Visit Diagnoses: Rheumatoid arthritis with rheumatoid factor of  multiple sites without organ or systems involvement ([place]) - +RF,+CCP Ab. dxd 2011.  Severe erosive changes noted on x-rays from 05/06/2021: He has no joint tenderness or synovitis on examination today.  He has not had any signs or symptoms of a rheumatoid arthritis flare. He has not had any joint pain, joint swelling, nocturnal pain, or difficulty with ADLs.  He plans on increasing his exercise regimen.   He is clinically doing well taking methotrexate 4 tablets by mouth once weekly and folic acid 951 mcg daily.  He has not had any recent infections and has not had to miss any doses of methotrexate recently.  He will remain on methotrexate as prescribed.  He was advised to remain compliant taking folic acid on a daily basis. He was advised to notify us if he develops increased joint pain or joint swelling.  He will follow-up in the office in 5 months.  High risk medication use -  MTX 4 tabs po qwk, Folic acid [id number] qd   CBC and CMP drawn on 12/17/2021.  He will be due to update lab work in April and every 3 months to monitor for drug toxicity.  Standing orders for CBC and CMP were placed today.- Plan: CBC with Differential/Platelet, COMPLETE METABOLIC PANEL WITH GFR He has not had any recent infections.  Discussed the importance of holding methotrexate if he develops signs or symptoms of infection and to resume once the infection is completely cleared.  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  X-rays of both hands were updated on 05/06/2021.  On examination he has PIP and DIP thickening consistent with osteoarthritis of both hands.  No tenderness or synovitis was noted.  He is able to make a complete fist bilaterally.  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  He has not had any difficulty with ADLs.  Discussed the importance of keeping his strength and range of motion.  Rheumatoid arthritis  involving both feet with positive rheumatoid factor (HCC) - Erosive changes noted.  He is not experiencing any discomfort in his feet at this time.  He has good range of motion of both ankle joints with no joint tenderness or  inflammation.  He is wearing proper fitting shoes.  Other medical conditions are listed as follows:  Essential hypertension  Paroxysmal atrial fibrillation ([place]) - He is on aspirin 81 mg p.o. daily  Pulmonary nodule - Followed by pulmonologist.  Defect of right cornea  Dyslipidemia  History of cataract  Actinic keratosis  Orders: Orders Placed This Encounter  Procedures   CBC with Differential/Platelet   COMPLETE METABOLIC PANEL WITH GFR   No orders of the defined types were placed in this encounter.     Follow-Up Instructions: Return in about 5 months (around 07/05/2022) for Rheumatoid arthritis.   Ofilia Neas, PA-C  Note - This record has been created using Dragon software.  Chart creation errors have been sought, but may not always  have been located. Such creation errors do not reflect on  the standard of medical care.

## 2022-02-05 ENCOUNTER — Encounter: Payer: Self-pay | Admitting: Physician Assistant

## 2022-02-05 ENCOUNTER — Other Ambulatory Visit: Payer: Self-pay

## 2022-02-05 ENCOUNTER — Ambulatory Visit: Payer: Medicare HMO | Admitting: Physician Assistant

## 2022-02-05 ENCOUNTER — Ambulatory Visit: Payer: Medicare Other | Admitting: Rheumatology

## 2022-02-05 VITALS — BP 163/93 | HR 108 | Ht 72.0 in | Wt 206.8 lb

## 2022-02-05 DIAGNOSIS — H18891 Other specified disorders of cornea, right eye: Secondary | ICD-10-CM

## 2022-02-05 DIAGNOSIS — I10 Essential (primary) hypertension: Secondary | ICD-10-CM | POA: Diagnosis not present

## 2022-02-05 DIAGNOSIS — M0579 Rheumatoid arthritis with rheumatoid factor of multiple sites without organ or systems involvement: Secondary | ICD-10-CM

## 2022-02-05 DIAGNOSIS — R911 Solitary pulmonary nodule: Secondary | ICD-10-CM

## 2022-02-05 DIAGNOSIS — L570 Actinic keratosis: Secondary | ICD-10-CM

## 2022-02-05 DIAGNOSIS — M05741 Rheumatoid arthritis with rheumatoid factor of right hand without organ or systems involvement: Secondary | ICD-10-CM | POA: Diagnosis not present

## 2022-02-05 DIAGNOSIS — Z79899 Other long term (current) drug therapy: Secondary | ICD-10-CM | POA: Diagnosis not present

## 2022-02-05 DIAGNOSIS — I480 Paroxysmal atrial fibrillation: Secondary | ICD-10-CM | POA: Diagnosis not present

## 2022-02-05 DIAGNOSIS — M05771 Rheumatoid arthritis with rheumatoid factor of right ankle and foot without organ or systems involvement: Secondary | ICD-10-CM

## 2022-02-05 DIAGNOSIS — E785 Hyperlipidemia, unspecified: Secondary | ICD-10-CM | POA: Diagnosis not present

## 2022-02-05 DIAGNOSIS — Z8669 Personal history of other diseases of the nervous system and sense organs: Secondary | ICD-10-CM | POA: Diagnosis not present

## 2022-02-05 DIAGNOSIS — M05772 Rheumatoid arthritis with rheumatoid factor of left ankle and foot without organ or systems involvement: Secondary | ICD-10-CM

## 2022-02-05 DIAGNOSIS — M05742 Rheumatoid arthritis with rheumatoid factor of left hand without organ or systems involvement: Secondary | ICD-10-CM

## 2022-02-05 NOTE — Patient Instructions (Signed)
Standing Labs ?We placed an order today for your standing lab work.  ? ?Please have your standing labs drawn in April and every 3 months  ? ?If possible, please have your labs drawn 2 weeks prior to your appointment so that the provider can discuss your results at your appointment. ? ?Please note that you may see your imaging and lab results in MyChart before we have reviewed them. ?We may be awaiting multiple results to interpret others before contacting you. ?Please allow our office up to 72 hours to thoroughly review all of the results before contacting the office for clarification of your results. ? ?We have open lab daily: ?Monday through Thursday from 1:30-4:30 PM and Friday from 1:30-4:00 PM ?at the office of Dr. Shaili Deveshwar, [facility].   ?Please be advised, all patients with office appointments requiring lab work will take precedent over walk-in lab work.  ?If possible, please come for your lab work on Monday and Friday afternoons, as you may experience shorter wait times. ?The office is located at [address], [address], [place], [place] [address] ?No appointment is necessary.   ?Labs are drawn by [facility]. Please bring your co-pay at the time of your lab draw.  You may receive a bill from [facility] for your lab work. ? ?Please note if you are on Hydroxychloroquine and and an order has been placed for a Hydroxychloroquine level, you will need to have it drawn 4 hours or more after your last dose. ? ?If you wish to have your labs drawn at another location, please call the office 24 hours in advance to send orders. ? ?If you have any questions regarding directions or hours of operation,  ?please call [phone].   ?As a reminder, please drink plenty of water prior to coming for your lab work. Thanks! ? ?

## 2022-02-14 ENCOUNTER — Other Ambulatory Visit: Payer: Self-pay | Admitting: Family Medicine

## 2022-03-13 DIAGNOSIS — Z7982 Long term (current) use of aspirin: Secondary | ICD-10-CM | POA: Diagnosis not present

## 2022-03-13 DIAGNOSIS — Z809 Family history of malignant neoplasm, unspecified: Secondary | ICD-10-CM | POA: Diagnosis not present

## 2022-03-13 DIAGNOSIS — Z8249 Family history of ischemic heart disease and other diseases of the circulatory system: Secondary | ICD-10-CM | POA: Diagnosis not present

## 2022-03-13 DIAGNOSIS — Z79631 Long term (current) use of antimetabolite agent: Secondary | ICD-10-CM | POA: Diagnosis not present

## 2022-03-13 DIAGNOSIS — E785 Hyperlipidemia, unspecified: Secondary | ICD-10-CM | POA: Diagnosis not present

## 2022-03-13 DIAGNOSIS — Z008 Encounter for other general examination: Secondary | ICD-10-CM | POA: Diagnosis not present

## 2022-03-13 DIAGNOSIS — K219 Gastro-esophageal reflux disease without esophagitis: Secondary | ICD-10-CM | POA: Diagnosis not present

## 2022-03-13 DIAGNOSIS — N529 Male erectile dysfunction, unspecified: Secondary | ICD-10-CM | POA: Diagnosis not present

## 2022-03-13 DIAGNOSIS — M069 Rheumatoid arthritis, unspecified: Secondary | ICD-10-CM | POA: Diagnosis not present

## 2022-03-13 DIAGNOSIS — I10 Essential (primary) hypertension: Secondary | ICD-10-CM | POA: Diagnosis not present

## 2022-07-02 NOTE — Progress Notes (Signed)
Office Visit Note  Patient: Max [name]             Date of Birth: April 02, 1951           MRN: [id number]             PCP: Vivi Barrack, MD Referring: Vivi Barrack, MD Visit Date: 07/16/2022 Occupation: @GUAROCC @  Subjective:  Medication management  History of Present Illness: Max [name] is a 71 y.o. male with history of seropositive erosive rheumatoid arthritis.  He states he has been taking methotrexate 4 tablets p.o. weekly.  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  He denies any increased pain or discomfort in his bilateral hands or his feet.  He states he had some discomfort in his left fifth toe for which she was seen by a podiatrist who recommended orthotics.  He still has some intermittent discomfort but he has not seen any joint swelling.  Activities of Daily Living:  Patient reports morning stiffness for 0 minutes.   Patient Denies nocturnal pain.  Difficulty dressing/grooming: Denies Difficulty climbing stairs: Denies Difficulty getting out of chair: Denies Difficulty using hands for taps, buttons, cutlery, and/or writing: Denies  Review of Systems  Constitutional:  Negative for fatigue.  HENT:  Negative for mouth sores and mouth dryness.   Eyes:  Positive for dryness.  Respiratory:  Negative for shortness of breath.   Cardiovascular:  Negative for chest pain and palpitations.  Gastrointestinal:  Negative for blood in stool, constipation and diarrhea.  Endocrine: Negative for increased urination.  Genitourinary:  Negative for involuntary urination.  Musculoskeletal:  Negative for joint pain, gait problem, joint pain, joint swelling, myalgias, muscle weakness, morning stiffness, muscle tenderness and myalgias.  Skin:  Negative for color change, rash, hair loss and sensitivity to sunlight.  Allergic/Immunologic: Negative for susceptible to infections.  Neurological:  Negative for dizziness and headaches.  Hematological:   Negative for swollen glands.  Psychiatric/Behavioral:  Negative for depressed mood and sleep disturbance. The patient is not nervous/anxious.     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   Essential hypertension 08/26/2007    Past Medical History:  Diagnosis Date   Abdominal hernia 11/28/2015   Allergic rhinitis    Anxiety    BPV (benign positional vertigo), right 05/11/2018   Cardiac arrhythmia    ED (erectile dysfunction)    Herpes    History of syncope    in the setting of anxiety per cardiology note   Hypertension    Kidney tumor (benign), right    sx 02/27/2015   Meningioma ([place])    had surgery    Paroxysmal atrial fibrillation ([place]) 07/15/2012   a) CHADSVASc score 1b) On full-dose ASA and Verapamil   RA (rheumatoid arthritis) ([place])    Seizures ([place]) 05/14/2011   "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    Vasovagal syncope     Family History  Problem Relation Age of Onset   Depression Other    Hypertension Other    GI problems Other    Lung disease Other  Hypertension Brother    Prostate cancer Paternal Grandfather    Healthy Son    Colon cancer Neg Hx    Esophageal cancer Neg Hx    Ulcerative colitis Neg Hx    Past Surgical History:  Procedure Laterality Date   BRAIN MENINGIOMA EXCISION  2012   CATARACT EXTRACTION W/ INTRAOCULAR LENS IMPLANT  ~ 2001   right   COLONOSCOPY  2008   EYE SURGERY  1964   right; "hit w/baseball; eye hemorrhaged"   [place]  ~ 2003   ROBOTIC ASSITED PARTIAL NEPHRECTOMY Right 02/29/2016   Procedure: XI ROBOTIC  ASSITED PARTIAL NEPHRECTOMY;  Surgeon: Alexis Frock, MD;  Location: [facility];  Service: Urology;  Laterality: Right;   NEEDS ULTRASOUND   Social History   Social History Narrative   Regular Exercise   Immunization History  Administered Date(s) Administered   Fluad Quad(high Dose 65+) 11/03/2020   Influenza Split 12/18/2011   Influenza Whole 10/18/2009, 10/22/2010   Influenza, High Dose Seasonal PF 11/28/2015   Influenza, Seasonal, Injecte, Preservative Fre 12/20/2012   Influenza,inj,Quad PF,6+ Mos 12/21/2013, 02/08/2015   Influenza-Unspecified 10/10/2018, 11/01/2021   PFIZER Comirnaty(Gray Top)Covid-19 Tri-Sucrose Vaccine 06/30/2021   PFIZER(Purple Top)SARS-COV-2 Vaccination 02/05/2020, 02/28/2020, 11/03/2020   Pneumococcal Conjugate-13 03/30/2017   Pneumococcal Polysaccharide-23 03/01/2016   Td 12/15/1998, 10/18/2009     Objective: Vital Signs: BP (!) 147/86 (BP Location: Left Arm, Patient Position: Sitting, Cuff Size: Normal)   Pulse 70   Resp 15   Ht 6' (1.829 m)   Wt 205 lb (93 kg)   BMI 27.80 kg/m    Physical Exam Vitals and nursing note reviewed.  Constitutional:      Appearance: He is well-developed.  HENT:     Head: Normocephalic and atraumatic.  Eyes:     Conjunctiva/sclera: Conjunctivae normal.     Pupils: Pupils are equal, round, and reactive to light.  Cardiovascular:     Rate and Rhythm: Normal rate and regular rhythm.     Heart sounds: Normal heart sounds.  Pulmonary:     Effort: Pulmonary effort is normal.     Breath sounds: Normal breath sounds.  Abdominal:     General: Bowel sounds are normal.     Palpations: Abdomen is soft.  Musculoskeletal:     Cervical back: Normal range of motion and neck supple.  Skin:    General: Skin is warm and dry.     Capillary Refill: Capillary refill takes less than 2 seconds.  Neurological:     Mental Status: He is alert and oriented to person, place, and time.  Psychiatric:        Behavior: Behavior normal.       Musculoskeletal Exam: Cervical spine, thoracic and lumbar spine were in good range of motion.  Shoulder joints, elbow joints, wrist joints, MCPs PIPs and DIPs with good range of motion.  Bilateral PIP and DIP thickening with no synovitis was noted.  Hip joints and knee joints in good range of motion.  No warmth swelling or effusion was noted.  Ankle joints were in good range of motion.  He had no tenderness over MTPs and no synovitis was noted.  Callus formation was noted next to his left fifth MTP joint.  CDAI Exam: CDAI Score: -- Patient Global: --; Provider Global: -- Swollen: --; Tender: -- Joint Exam 07/16/2022   No joint exam has been documented for this visit   There is currently no information documented on the  homunculus. Go to the Rheumatology activity and complete the homunculus joint exam.  Investigation: No additional findings.  Imaging: No results found.  Recent Labs: Lab Results  Component Value Date   WBC 4.5 12/17/2021   HGB 15.1 12/17/2021   PLT 326.0 12/17/2021   NA 139 12/17/2021   K 4.7 12/17/2021   CL 104 12/17/2021   CO2 25 12/17/2021   GLUCOSE 94 12/17/2021   BUN 20 12/17/2021   CREATININE 1.09 12/17/2021   BILITOT 0.8 12/17/2021   ALKPHOS 75 12/17/2021   AST 17 12/17/2021   ALT 18 12/17/2021   PROT 7.0 12/17/2021   ALBUMIN 4.3 12/17/2021   CALCIUM 9.5 12/17/2021   GFRAA 76 05/06/2021   QFTBGOLDPLUS NEGATIVE 05/06/2021    Speciality Comments: Dxd 2011. MTX x11 yrs. off and on.  Procedures:  No procedures performed Allergies: Vicodin [hydrocodone-acetaminophen]   Assessment / Plan:     Visit Diagnoses: Rheumatoid arthritis with rheumatoid factor of multiple sites without organ or systems involvement ([place]) - +RF,+CCP Ab. dxd 2011.  Severe erosive changes noted on x-rays from 05/06/2021: Despite having severe erosive rheumatoid arthritis and working long hours as a Pension scheme manager he has not noticed any increased joint swelling or joint pain.   He has been very active.  He has been taking methotrexate 4 tablets p.o. weekly without any side effects.  He denies any history of swelling.  High risk medication use - MTX 4 tabs po qwk, Folic acid [id number] qd.  Labs obtained on December 17, 2021 were reviewed.  He was advised to get labs every 3 months to monitor for drug toxicity.  Increased risk of hepato and renal toxicity with methotrexate was discussed.  We will obtain labs today.  He will be returning for lab work and November and then every 3 months.  Information regarding immunization was placed in the AVS.  He was also advised to hold methotrexate if he develops an infection and resume after the infection resolves.  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  Patient was referred to hand surgeon.  He states that he went for the appointment and decided not to see the hand surgeon as there was a long wait.  He will try to reschedule the appointment.  X-rays of both hands were updated on 05/06/2021.  He had no synovitis or tenderness on my examination.  Rheumatoid arthritis involving both feet with positive rheumatoid factor (HCC) - Erosive changes noted.  He has been having some discomfort over the left fifth MTP joint intermittently.  According to the patient he was seen by podiatrist who recommended some orthotics.  He had no synovitis on examination today.  Essential hypertension-he is on verapamil 150 mg CR daily.  His blood pressure is slightly elevated today.  Paroxysmal atrial fibrillation (HCC) - He is on aspirin 81 mg p.o. daily  Defect of right cornea  Pulmonary nodule - Followed by pulmonologist.  Dyslipidemia-he was on atorvastatin which she discontinued due to side effects.  Actinic keratosis  History of cataract  Orders: Orders Placed This Encounter  Procedures   CBC with Differential/Platelet   COMPLETE METABOLIC PANEL  WITH GFR   No orders of the defined types were placed in this encounter.    Follow-Up Instructions: Return in about 5 months (around 12/16/2022) for Rheumatoid arthritis.   Bo Merino, MD  Note - This record has been created using Editor, commissioning.  Chart creation  errors have been sought, but may not always  have been located. Such creation errors do not reflect on  the standard of medical care.

## 2022-07-16 ENCOUNTER — Encounter: Payer: Self-pay | Admitting: Rheumatology

## 2022-07-16 ENCOUNTER — Ambulatory Visit: Payer: Medicare HMO | Attending: Rheumatology | Admitting: Rheumatology

## 2022-07-16 VITALS — BP 147/86 | HR 70 | Resp 15 | Ht 72.0 in | Wt 205.0 lb

## 2022-07-16 DIAGNOSIS — M05742 Rheumatoid arthritis with rheumatoid factor of left hand without organ or systems involvement: Secondary | ICD-10-CM

## 2022-07-16 DIAGNOSIS — R911 Solitary pulmonary nodule: Secondary | ICD-10-CM | POA: Diagnosis not present

## 2022-07-16 DIAGNOSIS — Z79899 Other long term (current) drug therapy: Secondary | ICD-10-CM | POA: Diagnosis not present

## 2022-07-16 DIAGNOSIS — H18891 Other specified disorders of cornea, right eye: Secondary | ICD-10-CM

## 2022-07-16 DIAGNOSIS — I10 Essential (primary) hypertension: Secondary | ICD-10-CM | POA: Diagnosis not present

## 2022-07-16 DIAGNOSIS — E785 Hyperlipidemia, unspecified: Secondary | ICD-10-CM

## 2022-07-16 DIAGNOSIS — Z8669 Personal history of other diseases of the nervous system and sense organs: Secondary | ICD-10-CM

## 2022-07-16 DIAGNOSIS — L570 Actinic keratosis: Secondary | ICD-10-CM | POA: Diagnosis not present

## 2022-07-16 DIAGNOSIS — M0579 Rheumatoid arthritis with rheumatoid factor of multiple sites without organ or systems involvement: Secondary | ICD-10-CM | POA: Diagnosis not present

## 2022-07-16 DIAGNOSIS — M05771 Rheumatoid arthritis with rheumatoid factor of right ankle and foot without organ or systems involvement: Secondary | ICD-10-CM | POA: Diagnosis not present

## 2022-07-16 DIAGNOSIS — I480 Paroxysmal atrial fibrillation: Secondary | ICD-10-CM

## 2022-07-16 DIAGNOSIS — M05741 Rheumatoid arthritis with rheumatoid factor of right hand without organ or systems involvement: Secondary | ICD-10-CM | POA: Diagnosis not present

## 2022-07-16 DIAGNOSIS — M05772 Rheumatoid arthritis with rheumatoid factor of left ankle and foot without organ or systems involvement: Secondary | ICD-10-CM

## 2022-07-16 NOTE — Patient Instructions (Signed)
Standing Labs We placed an order today for your standing lab work.   Please have your standing labs drawn in November and every 3 months  If possible, please have your labs drawn 2 weeks prior to your appointment so that the provider can discuss your results at your appointment.  Please note that you may see your imaging and lab results in [place] before we have reviewed them. We may be awaiting multiple results to interpret others before contacting you. Please allow our office up to 72 hours to thoroughly review all of the results before contacting the office for clarification of your results.  We have open lab daily: Monday through Thursday from 1:30-4:30 PM and Friday from 1:30-4:00 PM at the office of Dr. Bo Merino, [facility].   Please be advised, all patients with office appointments requiring lab work will take precedent over walk-in lab work.  If possible, please come for your lab work on Monday and Friday afternoons, as you may experience shorter wait times. The office is located at [address], [place], [place], [place] [address] No appointment is necessary.   Labs are drawn by [facility]. Please bring your co-pay at the time of your lab draw.  You may receive a bill from [facility] for your lab work.  Please note if you are on Hydroxychloroquine and and an order has been placed for a Hydroxychloroquine level, you will need to have it drawn 4 hours or more after your last dose.  If you wish to have your labs drawn at another location, please call the office 24 hours in advance to send orders.  If you have any questions regarding directions or hours of operation,  please call [phone].   As a reminder, please drink plenty of water prior to coming for your lab work. Thanks!   Vaccines You are taking a medication(s) that can suppress your immune system.  The following immunizations are recommended: Flu annually Covid-19  Td/Tdap (tetanus, diphtheria,  pertussis) every 10 years Pneumonia (Prevnar 15 then Pneumovax 23 at least 1 year apart.  Alternatively, can take Prevnar 20 without needing additional dose) Shingrix: 2 doses from 4 weeks to 6 months apart  Please check with your PCP to make sure you are up to date.   If you have signs or symptoms of an infection or start antibiotics: First, call your PCP for workup of your infection. Hold your medication through the infection, until you complete your antibiotics, and until symptoms resolve if you take the following: Injectable medication (Actemra, Benlysta, Cimzia, Cosentyx, Enbrel, Humira, Kevzara, Orencia, Remicade, Simponi, Stelara, Taltz, Tremfya) Methotrexate Leflunomide (Arava) Mycophenolate (Cellcept) Morrie Sheldon, Olumiant, or Rinvoq

## 2022-07-17 LAB — CBC WITH DIFFERENTIAL/PLATELET
Absolute Monocytes: 458 cells/uL (ref 200–950)
Basophils Absolute: 62 cells/uL (ref 0–200)
Basophils Relative: 1.4 %
Eosinophils Absolute: 79 cells/uL (ref 15–500)
Eosinophils Relative: 1.8 %
HCT: 43.7 % (ref 38.5–50.0)
Hemoglobin: 15.2 g/dL (ref 13.2–17.1)
Lymphs Abs: 1206 cells/uL (ref 850–3900)
MCH: 35 pg — ABNORMAL HIGH (ref 27.0–33.0)
MCHC: 34.8 g/dL (ref 32.0–36.0)
MCV: 100.7 fL — ABNORMAL HIGH (ref 80.0–100.0)
MPV: 10.7 fL (ref 7.5–12.5)
Monocytes Relative: 10.4 %
Neutro Abs: 2596 cells/uL (ref 1500–7800)
Neutrophils Relative %: 59 %
Platelets: 342 10*3/uL (ref 140–400)
RBC: 4.34 10*6/uL (ref 4.20–5.80)
RDW: 13.5 % (ref 11.0–15.0)
Total Lymphocyte: 27.4 %
WBC: 4.4 10*3/uL (ref 3.8–10.8)

## 2022-07-17 LAB — COMPLETE METABOLIC PANEL WITH GFR
AG Ratio: 1.7 (calc) (ref 1.0–2.5)
ALT: 18 U/L (ref 9–46)
AST: 19 U/L (ref 10–35)
Albumin: 4.3 g/dL (ref 3.6–5.1)
Alkaline phosphatase (APISO): 77 U/L (ref 35–144)
BUN: 21 mg/dL (ref 7–25)
CO2: 27 mmol/L (ref 20–32)
Calcium: 9.4 mg/dL (ref 8.6–10.3)
Chloride: 109 mmol/L (ref 98–110)
Creat: 1.16 mg/dL (ref 0.70–1.28)
Globulin: 2.5 g/dL (calc) (ref 1.9–3.7)
Glucose, Bld: 89 mg/dL (ref 65–99)
Potassium: 4.6 mmol/L (ref 3.5–5.3)
Sodium: 142 mmol/L (ref 135–146)
Total Bilirubin: 0.6 mg/dL (ref 0.2–1.2)
Total Protein: 6.8 g/dL (ref 6.1–8.1)
eGFR: 67 mL/min/{1.73_m2} (ref >=60)

## 2022-07-17 NOTE — Progress Notes (Signed)
CBC and CMP normal

## 2022-07-29 ENCOUNTER — Other Ambulatory Visit: Payer: Self-pay | Admitting: Family Medicine

## 2022-07-30 ENCOUNTER — Other Ambulatory Visit: Payer: Self-pay | Admitting: Family Medicine

## 2022-07-30 DIAGNOSIS — M06041 Rheumatoid arthritis without rheumatoid factor, right hand: Secondary | ICD-10-CM

## 2022-08-20 ENCOUNTER — Ambulatory Visit: Payer: Medicare HMO | Admitting: Rheumatology

## 2022-09-08 ENCOUNTER — Encounter: Payer: Self-pay | Admitting: *Deleted

## 2022-10-28 ENCOUNTER — Other Ambulatory Visit: Payer: Self-pay | Admitting: Family Medicine

## 2022-11-25 ENCOUNTER — Ambulatory Visit (INDEPENDENT_AMBULATORY_CARE_PROVIDER_SITE_OTHER): Payer: Medicare HMO | Admitting: Family Medicine

## 2022-11-25 ENCOUNTER — Encounter: Payer: Self-pay | Admitting: Family Medicine

## 2022-11-25 VITALS — BP 146/79 | HR 76 | Temp 97.3°F | Ht 72.0 in | Wt 203.2 lb

## 2022-11-25 DIAGNOSIS — Z8042 Family history of malignant neoplasm of prostate: Secondary | ICD-10-CM | POA: Diagnosis not present

## 2022-11-25 DIAGNOSIS — I480 Paroxysmal atrial fibrillation: Secondary | ICD-10-CM | POA: Diagnosis not present

## 2022-11-25 DIAGNOSIS — Z8619 Personal history of other infectious and parasitic diseases: Secondary | ICD-10-CM | POA: Diagnosis not present

## 2022-11-25 DIAGNOSIS — L570 Actinic keratosis: Secondary | ICD-10-CM | POA: Diagnosis not present

## 2022-11-25 DIAGNOSIS — I10 Essential (primary) hypertension: Secondary | ICD-10-CM

## 2022-11-25 DIAGNOSIS — Z23 Encounter for immunization: Secondary | ICD-10-CM

## 2022-11-25 MED ORDER — ACYCLOVIR 800 MG PO TABS: 800.0000 mg | ORAL_TABLET | Freq: Three times a day (TID) | ORAL | 1 refills | Status: DC | PRN

## 2022-11-25 NOTE — Assessment & Plan Note (Signed)
Cryotherapy performed today.  See below procedure note.  Tolerated well.

## 2022-11-25 NOTE — Patient Instructions (Addendum)
It was very nice to see you today!  We froze the spots on your forehead today.  Please try using Gas-X or simethicone to help with your abdominal pain.  Will see you back soon for your annual physical.  Please come back sooner if needed.  We will refill your acyclovir.  Take care, Dr [name]  PLEASE NOTE:  If you had any lab tests please let us know if you have not heard back within a few days. You may see your results on mychart before we have a chance to review them but we will give you a call once they are reviewed by [place]. If we ordered any referrals today, please let us know if you have not heard from their office within the next week.   Please try these tips to maintain a healthy lifestyle:  Eat at least 3 REAL meals and 1-2 snacks per day.  Aim for no more than 5 hours between eating.  If you eat breakfast, please do so within one hour of getting up.   Each meal should contain half fruits/vegetables, one quarter protein, and one quarter carbs (no bigger than a computer mouse)  Cut down on sweet beverages. This includes juice, soda, and sweet tea.   Drink at least 1 glass of water with each meal and aim for at least 8 glasses per day  Exercise at least 150 minutes every week.

## 2022-11-25 NOTE — Assessment & Plan Note (Signed)
At goal per JNC 8 though typically better controlled at home.  Continue verapamil 180 mg daily.

## 2022-11-25 NOTE — Assessment & Plan Note (Signed)
Will check PSa next blood draw.

## 2022-11-25 NOTE — Progress Notes (Signed)
   Max [name] is a 71 y.o. male who presents today for an office visit.  Assessment/Plan:  New/Acute Problems: Abdominal pain No red flags.  Reassuring exam today.  Potentially could be related to gas pains or bowel spasm.  He is not having any reflux symptoms.  May have some underlying constipation contributing as well.  Recommend he try simethicone. He will let me know if not improving.  Seborrheic keratosis Reassured patient.  Lesion on left temporal area consistent with seborrheic keratosis.  No red flags. Cryotherapy was applied today.  He tolerated well.  Chronic Problems Addressed Today: Family history of prostate cancer Will check PSa next blood draw.   Essential hypertension At goal per JNC 8 though typically better controlled at home.  Continue verapamil 180 mg daily.  History of cold sores Stable.  Acyclovir refilled.  Actinic keratosis Cryotherapy performed today.  See below procedure note.  Tolerated well.  Flu shot given today.     Subjective:  HPI:  See a/p for status of chronic conditions.  He has some skin lesions on his face that he is concerned about.  These been present for over a year.  Noticed dark spot on left side of his forehead.  No recent changes.  No bleeding.  No pain.  He also has occasional abdominal pain.  Happens a few times per month.  Usually after eating he will have pain in the upper abdomen when standing up however this is relieved when sitting down.  No nausea or vomiting.  No area.  No melena or hematochezia.  No hematemesis.  No specific treatments tried.  No heartburn or reflux.  No regurgitation.       Objective:  Physical Exam: BP (!) 146/79   Pulse 76   Temp (!) 97.3 F (36.3 C) (Temporal)   Ht 6' (1.829 m)   Wt 203 lb 3.2 oz (92.2 kg)   SpO2 98%   BMI 27.56 kg/m   Gen: No acute distress, resting comfortably CV: Irregular rate and rhythm with no murmurs appreciated Pulm: Normal work of breathing, clear to  auscultation bilaterally with no crackles, wheezes, or rhonchi GI: Soft, nontender, nondistended.  No rebound or guarding Skin: Several scattered actinic keratoses on forehead as well as a seborrheic keratosis at left temporal area Neuro: Grossly normal, moves all extremities Psych: Normal affect and thought content  Cryotherapy Procedure Note  Pre-operative Diagnosis: Actinic keratosis  Locations: Bilatera face  Indications: Therapeutic  Procedure Details  Patient informed of risks (permanent scarring, infection, light or dark discoloration, bleeding, infection, weakness, numbness and recurrence of the lesion) and benefits of the procedure and verbal informed consent obtained.  The areas are treated with liquid nitrogen therapy, frozen until ice ball extended 3 mm beyond lesion, allowed to thaw, and treated again. The patient tolerated procedure well.  A total of 7 lesions were treated. The patient was instructed on post-op care, warned that there may be blister formation, redness and pain. Recommend OTC analgesia as needed for pain.  Condition: Stable  Complications: none.       Max [name]. [name], MD 11/25/2022 8:01 AM

## 2022-11-25 NOTE — Assessment & Plan Note (Addendum)
Stable.  Acyclovir refilled.

## 2022-12-05 NOTE — Progress Notes (Signed)
Office Visit Note  Patient: Max [name]             Date of Birth: May 03, 1951           MRN: [id number]             PCP: Vivi Barrack, MD Referring: Vivi Barrack, MD Visit Date: 12/19/2022 Occupation: @GUAROCC @  Subjective:  Medication monitoring  History of Present Illness: Max [name] is a 71 y.o. male with history of seropositive rheumatoid arthritis.  Patient remains on methotrexate 4 tablet by mouth once weekly.  He is tolerating methotrexate without any side effects and has not missed any doses recently.  He has been taking folic acid 465 mcg over-the-counter on a daily basis.  He denies any signs or symptoms of a rheumatoid arthritis flare.  He has not had any joint pain, joint stiffness, or joint swelling.  He denies any nocturnal symptoms.  He has not had any difficulty with ADLs.  He continues to work 7 days a week without difficulty.  Patient reports that he had his yearly physical yesterday and had updated lab work drawn at that time.  He does not need any refills at this time.  He denies any recent or recurrent infections.  He denies any new medical conditions.      Activities of Daily Living:  Patient reports morning stiffness for 0 minutes  Patient Denies nocturnal pain.  Difficulty dressing/grooming: Denies Difficulty climbing stairs: Denies Difficulty getting out of chair: Denies Difficulty using hands for taps, buttons, cutlery, and/or writing: Denies  Review of Systems  Constitutional:  Negative for fatigue and night sweats.  HENT:  Negative for mouth sores, mouth dryness and nose dryness.   Eyes:  Negative for redness and dryness.  Respiratory:  Negative for cough, hemoptysis, shortness of breath and difficulty breathing.   Cardiovascular:  Negative for chest pain, palpitations, hypertension, irregular heartbeat and swelling in legs/feet.  Gastrointestinal:  Negative for blood in stool, constipation and diarrhea.  Endocrine: Negative for  increased urination.  Genitourinary:  Negative for urgency.  Musculoskeletal:  Positive for joint pain and joint pain. Negative for joint swelling, myalgias, muscle weakness, morning stiffness, muscle tenderness and myalgias.  Skin:  Negative for color change, rash, hair loss, nodules/bumps, skin tightness, ulcers and sensitivity to sunlight.  Neurological:  Negative for dizziness, fainting, memory loss and night sweats.  Hematological:  Negative for swollen glands.  Psychiatric/Behavioral:  Negative for depressed mood. The patient is not nervous/anxious.     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   Essential hypertension 08/26/2007    Past Medical History:  Diagnosis Date   Abdominal hernia 11/28/2015   Allergic rhinitis    Anxiety    BPV (benign positional vertigo), right 05/11/2018   Cardiac arrhythmia    ED (erectile dysfunction)    Herpes    History of syncope    in the setting of anxiety per cardiology note   Hypertension    Kidney tumor (benign), right    sx 02/27/2015   Meningioma ([place])    had  surgery    Paroxysmal atrial fibrillation ([place]) 07/15/2012   a) CHADSVASc score 1b) On full-dose ASA and Verapamil   RA (rheumatoid arthritis) ([place])    Seizures ([place]) 05/14/2011   "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    Vasovagal syncope     Family History  Problem Relation Age of Onset   Hypertension Brother    Prostate cancer Brother    Prostate cancer Paternal Grandfather    Healthy Son    Depression Other    Hypertension Other    GI problems  Other    Lung disease Other    Colon cancer Neg Hx    Esophageal cancer Neg Hx    Ulcerative colitis Neg Hx    Past Surgical History:  Procedure Laterality Date   BRAIN MENINGIOMA EXCISION  2012   CATARACT EXTRACTION W/ INTRAOCULAR LENS IMPLANT  ~ 2001   right   COLONOSCOPY  2008   [place]   right; "hit w/baseball; eye hemorrhaged"   [place]  ~ 2003   ROBOTIC ASSITED PARTIAL NEPHRECTOMY Right 02/29/2016   Procedure: XI ROBOTIC ASSITED PARTIAL NEPHRECTOMY;  Surgeon: Alexis Frock, MD;  Location: [facility];  Service: Urology;  Laterality: Right;   NEEDS ULTRASOUND   Social History   Social History Narrative   Regular Exercise   Immunization History  Administered Date(s) Administered   Fluad Quad(high Dose 65+) 11/03/2020, 11/25/2022   Influenza Split 12/18/2011   Influenza Whole 10/18/2009, 10/22/2010   Influenza, High Dose Seasonal PF 11/28/2015   Influenza, Seasonal, Injecte, Preservative Fre 12/20/2012   Influenza,inj,Quad PF,6+ Mos 12/21/2013, 02/08/2015   Influenza-Unspecified 10/10/2018, 11/01/2021   PFIZER Comirnaty(Gray Top)Covid-19 Tri-Sucrose Vaccine 06/30/2021   PFIZER(Purple Top)SARS-COV-2 Vaccination 02/05/2020, 02/28/2020, 11/03/2020   Pneumococcal Conjugate-13 03/30/2017   Pneumococcal Polysaccharide-23 03/01/2016   Td 12/15/1998, 10/18/2009     Objective: Vital Signs: BP (!) 139/92 (BP Location: Right Arm, Patient Position: Sitting, Cuff Size: Normal)   Pulse (!) 123   Ht 6' (1.829 m)   Wt 205 lb (93 kg)   HC 15" (38.1 cm)   BMI 27.80 kg/m    Physical Exam Vitals and nursing note reviewed.  Constitutional:      Appearance: He is well-developed.  HENT:     Head: Normocephalic and atraumatic.  Eyes:     Conjunctiva/sclera: Conjunctivae normal.     Pupils: Pupils are equal, round, and reactive to light.  Cardiovascular:     Rate and Rhythm: Rhythm irregular.  Pulmonary:     Effort: Pulmonary effort is  normal.     Breath sounds: Normal breath sounds.  Abdominal:     General: Bowel sounds are normal.     Palpations: Abdomen is soft.  Musculoskeletal:     Cervical back: Normal range of motion and neck supple.  Skin:    General: Skin is warm and dry.     Capillary Refill: Capillary refill takes less than 2 seconds.  Neurological:     Mental Status: He is alert and oriented to person, place, and time.  Psychiatric:        Behavior: Behavior normal.      Musculoskeletal Exam: C-spine, thoracic spine, lumbar spine have good range of motion with no discomfort.  No midline spinal tenderness or SI joint tenderness.  Shoulder joints, elbow joints, wrist joints, MCPs, PIPs, DIPs have good range of motion with no synovitis.  PIP and DIP thickening consistent with osteoarthritis of both hands.  Subluxation of the right first DIP joint noted.  Complete fist formation bilaterally.  Hip joints have good range of motion with no groin pain.  Knee joints have good range of motion with no warmth or effusion.  Ankle joints have good range of motion without tenderness or joint swelling.  CDAI Exam: CDAI Score: -- Patient Global: 1 mm; Provider Global: 1 mm Swollen: --; Tender: -- Joint Exam 12/19/2022   No joint exam has been documented for this visit   There is currently no information documented on the homunculus. Go to the Rheumatology activity and complete the homunculus joint exam.  Investigation: No additional findings.  Imaging: No results found.  Recent Labs: Lab Results  Component Value Date   WBC 5.6 12/18/2022   HGB 15.1 12/18/2022   PLT 357.0 12/18/2022   NA 140 12/18/2022   K 4.9 12/18/2022   CL 104 12/18/2022   CO2 29 12/18/2022   GLUCOSE 96 12/18/2022   BUN 20 12/18/2022   CREATININE 1.11 12/18/2022   BILITOT 0.9 12/18/2022   ALKPHOS 74 12/18/2022   AST 19 12/18/2022   ALT 20 12/18/2022   PROT 6.8 12/18/2022   ALBUMIN 4.2 12/18/2022   CALCIUM 9.5 12/18/2022   GFRAA  76 05/06/2021   QFTBGOLDPLUS NEGATIVE 05/06/2021    Speciality Comments: Dxd 2011. MTX x11 yrs. off and on.  Procedures:  No procedures performed Allergies: Vicodin [hydrocodone-acetaminophen]   Assessment / Plan:     Visit Diagnoses: Rheumatoid arthritis with rheumatoid factor of multiple sites without organ or systems involvement ([place]) - +RF,+CCP Ab. dxd 2011.  Severe erosive changes noted on x-rays from 05/06/2021: He has no joint tenderness or synovitis on examination today.  He has not had any signs or symptoms of a rheumatoid arthritis flare.  He has clinically been doing well taking methotrexate 4 tablets by mouth once weekly and folic acid 188 mcg daily.  He continues to tolerate methotrexate without any side effects and has not missed any doses recently.  He has not had any recent or recurrent infections.  No new medical conditions.  He has not had any morning stiffness, nocturnal pain, or difficulty with ADLs.  He will remain on methotrexate as prescribed.  A prescription for folic acid 1 mg daily will be sent to the pharmacy.  He was advised to notify us if he develops signs or symptoms of a flare.  He will follow-up in the office in 5 months or sooner if needed.  High risk medication use -Methotrexate 4 tablets by mouth once weekly and folic acid 1 mg daily.  A prescription for folic acid will be sent to the pharmacy today. CBC and CMP drawn on 12/18/22.  Results reviewed with the patient today in the office.  Folate and vitamin B12 were within normal limits.  His next lab work will be due in April and every 3 months. Standing orders for CBC and CMP remain in place.  No recent or recurrent infections.  Discussed the importance of holding methotrexate if he develops signs or symptoms of an infection and to resume once the infection has completely cleared.   No new medical conditions.  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  Previously followed by hand surgeon.  He has no joint tenderness or synovitis on examination today.  Rheumatoid arthritis involving both feet with positive rheumatoid factor (HCC) - Erosive changes noted.  He is not experiencing  any discomfort in his feet at this time.  He has good range of motion of both ankle joints with no tenderness or synovitis.  Other medical conditions are listed as follows:  Essential hypertension -He is taking verapamil as prescribed.  Blood pressure was 139/92 today in the office.  Paroxysmal atrial fibrillation (HCC) - He is on aspirin 81 mg p.o. daily  Pulmonary nodule - Followed by pulmonologist.  Defect of right cornea  Dyslipidemia  Actinic keratosis  History of cataract  Orders: No orders of the defined types were placed in this encounter.  Meds ordered this encounter  Medications   folic acid (FOLVITE) 1 MG tablet    Sig: Take 1 tablet (1 mg total) by mouth daily.    Dispense:  90 tablet    Refill:  3     Follow-Up Instructions: Return in about 5 months (around 05/20/2023) for Rheumatoid arthritis.   Ofilia Neas, PA-C  Note - This record has been created using Dragon software.  Chart creation errors have been sought, but may not always  have been located. Such creation errors do not reflect on  the standard of medical care.

## 2022-12-09 ENCOUNTER — Ambulatory Visit (INDEPENDENT_AMBULATORY_CARE_PROVIDER_SITE_OTHER): Payer: Medicare HMO

## 2022-12-09 VITALS — BP 130/74 | HR 87 | Temp 97.7°F | Wt 203.2 lb

## 2022-12-09 DIAGNOSIS — Z0000 Encounter for general adult medical examination without abnormal findings: Secondary | ICD-10-CM

## 2022-12-09 NOTE — Progress Notes (Signed)
Subjective:   Max [name] is a 71 y.o. male who presents for Medicare Annual/Subsequent preventive examination.  Review of Systems     Cardiac Risk Factors include: advanced age (>89men, >38 women);hypertension;dyslipidemia;male gender     Objective:    Today's Vitals   12/09/22 0906  BP: 130/74  Pulse: 87  Temp: 97.7 F (36.5 C)  SpO2: 97%  Weight: 203 lb 3.2 oz (92.2 kg)   Body mass index is 27.56 kg/m.     12/09/2022    9:11 AM 11/18/2021    8:11 AM 11/12/2020    8:18 AM 12/12/2019    9:22 AM 12/11/2019    1:34 PM 08/19/2019    8:14 AM 02/29/2016    5:48 AM  Advanced Directives  Does Patient Have a Medical Advance Directive? Yes No Yes No No Yes No  Type of Paramedic of [place];Living will  [place];Living will   Living will;Healthcare Power of Attorney   Does patient want to make changes to medical advance directive?      No - Patient declined   Copy of [place] in Chart? No - copy requested  No - copy requested   No - copy requested   Would patient like information on creating a medical advance directive?  Yes (MAU/Ambulatory/Procedural Areas - Information given)  No - Patient declined       Current Medications (verified) Outpatient Encounter Medications as of 12/09/2022  Medication Sig   acyclovir (ZOVIRAX) 800 MG tablet Take 1 tablet (800 mg total) by mouth 3 (three) times daily as needed. For outbreaks   Ascorbic Acid (VITAMIN C PO) Take 1 tablet by mouth daily.   aspirin 81 MG chewable tablet Chew by mouth daily.   Cholecalciferol (VITAMIN D3 PO) Take by mouth.   Coenzyme Q10 (CO Q 10 PO) Take by mouth daily.   folic acid (FOLVITE) 627 MCG tablet Take 400 mcg by mouth daily.   methotrexate (RHEUMATREX) 2.5 MG tablet TAKE 4 TABLETS BY MOUTH WEEKLY   Multiple Vitamin (MULTIVITAMIN) tablet Take 1 tablet by mouth daily.   Probiotic Product (PROBIOTIC PO) Take by mouth daily.   verapamil  (CALAN-SR) 180 MG CR tablet TAKE 1 TABLET BY MOUTH EVERY DAY   [DISCONTINUED] sildenafil (REVATIO) 20 MG tablet Take 1 to 2 tabs 2 - 3 hours before sex   No facility-administered encounter medications on file as of 12/09/2022.    Allergies (verified) Vicodin [hydrocodone-acetaminophen]   History: Past Medical History:  Diagnosis Date   Abdominal hernia 11/28/2015   Allergic rhinitis    Anxiety    BPV (benign positional vertigo), right 05/11/2018   Cardiac arrhythmia    ED (erectile dysfunction)    Herpes    History of syncope    in the setting of anxiety per cardiology note   Hypertension    Kidney tumor (benign), right    sx 02/27/2015   Meningioma ([place])    had surgery    Paroxysmal atrial fibrillation ([place]) 07/15/2012   a) CHADSVASc score 1b) On full-dose ASA and Verapamil   RA (rheumatoid arthritis) ([place])    Seizures ([place]) 05/14/2011   "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    Vasovagal syncope    Past Surgical History:  Procedure Laterality Date   BRAIN MENINGIOMA EXCISION  2012   CATARACT EXTRACTION W/ INTRAOCULAR LENS IMPLANT  ~ 2001   right   COLONOSCOPY  2008   [place]  right; "hit w/baseball; eye hemorrhaged"   [place]  ~ 2003   ROBOTIC ASSITED PARTIAL NEPHRECTOMY Right 02/29/2016   Procedure: XI ROBOTIC ASSITED PARTIAL NEPHRECTOMY;  Surgeon: Alexis Frock, MD;  Location: [facility];  Service: Urology;  Laterality: Right;   NEEDS ULTRASOUND   Family History  Problem Relation Age of Onset   Hypertension Brother    Prostate cancer Brother    Prostate cancer Paternal Grandfather    Healthy Son    Depression Other    Hypertension Other    GI problems Other    Lung disease Other    Colon cancer Neg Hx    Esophageal cancer Neg Hx    Ulcerative colitis Neg Hx    Social History   Socioeconomic History   Marital status: Married    Spouse name: Not on file   Number of children: 1   Years of education: Not on  file   Highest education level: Not on file  Occupational History    Comment: [employer]   Tobacco Use   Smoking status: Never    Passive exposure: Past   Smokeless tobacco: Never  Vaping Use   Vaping Use: Never used  Substance and Sexual Activity   Alcohol use: No   Drug use: No   Sexual activity: Yes  Other Topics Concern   Not on file  Social History Narrative   Regular Exercise   Social Determinants of Health   Financial Resource Strain: Low Risk  (12/09/2022)   Overall Financial Resource Strain (CARDIA)    Difficulty of Paying Living Expenses: Not hard at all  Food Insecurity: No Food Insecurity (12/09/2022)   Hunger Vital Sign    Worried About Running Out of Food in the Last Year: Never true    Ran Out of Food in the Last Year: Never true  Transportation Needs: No Transportation Needs (12/09/2022)   PRAPARE - Hydrologist (Medical): No    Lack of Transportation (Non-Medical): No  Physical Activity: Inactive (12/09/2022)   Exercise Vital Sign    Days of Exercise per Week: 0 days    Minutes of Exercise per Session: 0 min  Stress: No Stress Concern Present (12/09/2022)   [place]    Feeling of Stress : Not at all  Social Connections: Moderately Isolated (12/09/2022)   Social Connection and Isolation Panel [NHANES]    Frequency of Communication with Friends and Family: Once a week    Frequency of Social Gatherings with Friends and Family: More than three times a week    Attends Religious Services: Never    Marine scientist or Organizations: No    Attends Music therapist: Never    Marital Status: Married    Tobacco Counseling Counseling given: Not Answered   Clinical Intake:  Pre-visit preparation completed: Yes  Pain : No/denies pain     BMI - recorded: 27.56 Nutritional Status: BMI 25 -29 Overweight Nutritional Risks:  None Diabetes: No  How often do you need to have someone help you when you read instructions, pamphlets, or other written materials from your doctor or pharmacy?: 1 - Never  Diabetic?no  Interpreter Needed?: No  Information entered by :: Charlott Rakes, LPN   Activities of Daily Living    12/09/2022    9:12 AM 11/24/2022    8:07 PM  In your present state of health, do you  have any difficulty performing the following activities:  Hearing? 0 0  Vision? 0 0  Difficulty concentrating or making decisions? 0 0  Walking or climbing stairs? 0 0  Dressing or bathing? 0 0  Doing errands, shopping? 0 0  Preparing Food and eating ? N N  Using the Toilet? N N  In the past six months, have you accidently leaked urine? N N  Do you have problems with loss of bowel control? N N  Managing your Medications? N N  Managing your Finances? N N  Housekeeping or managing your Housekeeping? N N    Patient Care Team: Vivi Barrack, MD as PCP - General (Family Medicine) Leeroy Cha, MD as Consulting Physician (Neurosurgery) Marshell Garfinkel, MD as Consulting Physician (Pulmonary Disease) Hortencia Pilar, MD as Consulting Physician (Ophthalmology)  Indicate any recent Medical Services you may have received from other than [facility] providers in the past year (date may be approximate).     Assessment:   This is a routine wellness examination for Gavriel.  Hearing/Vision screen Hearing Screening - Comments:: Pt denies any hearing issues  Vision Screening - Comments:: Pt [facility] for [facility] eye exams   Dietary issues and exercise activities discussed: Current Exercise Habits: The patient has a physically strenuous job, but has no regular exercise apart from work.   Goals Addressed             This Visit's Progress    Patient Stated       Start walking more        Depression Screen    12/09/2022    9:10 AM 11/25/2022    7:25 AM 11/18/2021    8:10 AM  11/12/2020    8:17 AM 07/06/2020    8:17 AM 08/19/2019    7:58 AM 01/28/2019    8:29 AM  PHQ 2/9 Scores  PHQ - 2 Score 0 0 0 0 0 0 0  PHQ- 9 Score     0      Fall Risk    12/09/2022    9:12 AM 11/25/2022    7:25 AM 11/24/2022    8:07 PM 11/18/2021    8:12 AM 11/12/2020    8:19 AM  Fall Risk   Falls in the past year? 0 0 0 0 0  Number falls in past yr: 0 0  0 0  Injury with Fall? 0 0 0 0 0  Risk for fall due to : Impaired vision No Fall Risks  Impaired vision;Impaired balance/gait Impaired vision  Risk for fall due to: Comment    knees give out at times   Follow up Falls prevention discussed   Falls prevention discussed Falls prevention discussed    FALL RISK PREVENTION PERTAINING TO THE HOME:  Any stairs in or around the home? Yes  If so, are there any without handrails? No  Home free of loose throw rugs in walkways, pet beds, electrical cords, etc? Yes  Adequate lighting in your home to reduce risk of falls? Yes   ASSISTIVE DEVICES UTILIZED TO PREVENT FALLS:  Life alert? No  Use of a cane, walker or w/c? No  Grab bars in the bathroom? Yes  Shower chair or bench in shower? Yes  Elevated toilet seat or a handicapped toilet? No   TIMED UP AND GO:  Was the test performed? Yes .  Length of time to ambulate 10 feet: 10 sec.   Gait steady and fast without use of assistive  device  Cognitive Function:        12/09/2022    9:13 AM 11/18/2021    8:13 AM 11/12/2020    8:21 AM 08/19/2019    8:13 AM  6CIT Screen  What Year? 0 points 0 points 0 points 0 points  What month? 0 points 0 points 0 points 0 points  What time? 0 points 0 points  0 points  Count back from 20 0 points 0 points 0 points 0 points  Months in reverse 0 points 0 points 0 points 0 points  Repeat phrase 0 points 0 points 0 points 0 points  Total Score 0 points 0 points  0 points    Immunizations Immunization History  Administered Date(s) Administered   Fluad Quad(high Dose 65+) 11/03/2020, 11/25/2022    Influenza Split 12/18/2011   Influenza Whole 10/18/2009, 10/22/2010   Influenza, High Dose Seasonal PF 11/28/2015   Influenza, Seasonal, Injecte, Preservative Fre 12/20/2012   Influenza,inj,Quad PF,6+ Mos 12/21/2013, 02/08/2015   Influenza-Unspecified 10/10/2018, 11/01/2021   PFIZER Comirnaty(Gray Top)Covid-19 Tri-Sucrose Vaccine 06/30/2021   PFIZER(Purple Top)SARS-COV-2 Vaccination 02/05/2020, 02/28/2020, 11/03/2020   Pneumococcal Conjugate-13 03/30/2017   Pneumococcal Polysaccharide-23 03/01/2016   Td 12/15/1998, 10/18/2009    TDAP status: Up to date per pt   Flu Vaccine status: Up to date  Pneumococcal vaccine status: Up to date  Covid-19 vaccine status: Completed vaccines  Qualifies for Shingles Vaccine? Yes   Zostavax completed No   Shingrix Completed?: No.    Education has been provided regarding the importance of this vaccine. Patient has been advised to call insurance company to determine out of pocket expense if they have not yet received this vaccine. Advised may also receive vaccine at local pharmacy or Health Dept. Verbalized acceptance and understanding.  Screening Tests Health Maintenance  Topic Date Due   Medicare Annual Wellness (AWV)  12/10/2023   COLONOSCOPY (Pts 45-62yrs Insurance coverage will need to be confirmed)  04/16/2028   Pneumonia Vaccine 56+ Years old  Completed   INFLUENZA VACCINE  Completed   Hepatitis C Screening  Completed   HPV VACCINES  Aged Out   DTaP/Tdap/Td  Discontinued   COVID-19 Vaccine  Discontinued   Zoster Vaccines- Shingrix  Discontinued    Health Maintenance  There are no preventive care reminders to display for this patient.   Colorectal cancer screening: Type of screening: Colonoscopy. Completed 04/16/18. Repeat every 10 years  Additional Screening:  Hepatitis C Screening:  Completed 5/823/22  Vision Screening: Recommended annual ophthalmology exams for early detection of glaucoma and other disorders of the eye. Is  the patient up to date with their annual eye exam?  Yes  Who is the provider or what is the name of the office in which the patient attends annual eye exams? [facility]  If pt is not established with a provider, would they like to be referred to a provider to establish care? No .   Dental Screening: Recommended annual dental exams for proper oral hygiene  Community Resource Referral / Chronic Care Management: CRR required this visit?  No   CCM required this visit?  No      Plan:     I have personally reviewed and noted the following in the patient's chart:   Medical and social history Use of alcohol, tobacco or illicit drugs  Current medications and supplements including opioid prescriptions. Patient is not currently taking opioid prescriptions. Functional ability and status Nutritional status Physical activity Advanced directives List of other physicians Hospitalizations,  surgeries, and ER visits in previous 12 months Vitals Screenings to include cognitive, depression, and falls Referrals and appointments  In addition, I have reviewed and discussed with patient certain preventive protocols, quality metrics, and best practice recommendations. A written personalized care plan for preventive services as well as general preventive health recommendations were provided to patient.     Willette Brace, LPN   [id number]   Nurse Notes: none

## 2022-12-09 NOTE — Patient Instructions (Signed)
Max [name] , Thank you for taking time to come for your Medicare Wellness Visit. I appreciate your ongoing commitment to your health goals. Please review the following plan we discussed and let me know if I can assist you in the future.   These are the goals we discussed:  Goals      Patient Stated     Lose 15lbs      Patient Stated     Keep living         This is a list of the screening recommended for you and due dates:  Health Maintenance  Topic Date Due   Medicare Annual Wellness Visit  12/10/2023   Colon Cancer Screening  04/16/2028   Pneumonia Vaccine  Completed   Flu Shot  Completed   Hepatitis C Screening: USPSTF Recommendation to screen - Ages 18-79 yo.  Completed   HPV Vaccine  Aged Out   DTaP/Tdap/Td vaccine  Discontinued   COVID-19 Vaccine  Discontinued   Zoster (Shingles) Vaccine  Discontinued    Advanced directives: Please bring a copy of your health care power of attorney and living will to the office at your convenience.   Conditions/risks identified: start walking more   Next appointment: Follow up in one year for your annual wellness visit.   Preventive Care 18 Years and Older, Male  Preventive care refers to lifestyle choices and visits with your health care provider that can promote health and wellness. What does preventive care include? A yearly physical exam. This is also called an annual well check. Dental exams once or twice a year. Routine eye exams. Ask your health care provider how often you should have your eyes checked. Personal lifestyle choices, including: Daily care of your teeth and gums. Regular physical activity. Eating a healthy diet. Avoiding tobacco and drug use. Limiting alcohol use. Practicing safe sex. Taking low doses of aspirin every day. Taking vitamin and mineral supplements as recommended by your health care provider. What happens during an annual well check? The services and screenings done by your health care  provider during your annual well check will depend on your age, overall health, lifestyle risk factors, and family history of disease. Counseling  Your health care provider may ask you questions about your: Alcohol use. Tobacco use. Drug use. Emotional well-being. Home and relationship well-being. Sexual activity. Eating habits. History of falls. Memory and ability to understand (cognition). Work and work Statistician. Screening  You may have the following tests or measurements: Height, weight, and BMI. Blood pressure. Lipid and cholesterol levels. These may be checked every 5 years, or more frequently if you are over 25 years old. Skin check. Lung cancer screening. You may have this screening every year starting at age 24 if you have a 30-pack-year history of smoking and currently smoke or have quit within the past 15 years. Fecal occult blood test (FOBT) of the stool. You may have this test every year starting at age 35. Flexible sigmoidoscopy or colonoscopy. You may have a sigmoidoscopy every 5 years or a colonoscopy every 10 years starting at age 104. Prostate cancer screening. Recommendations will vary depending on your family history and other risks. Hepatitis C blood test. Hepatitis B blood test. Sexually transmitted disease (STD) testing. Diabetes screening. This is done by checking your blood sugar (glucose) after you have not eaten for a while (fasting). You may have this done every 1-3 years. Abdominal aortic aneurysm (AAA) screening. You may need this if you are a current or  former smoker. Osteoporosis. You may be screened starting at age 66 if you are at high risk. Talk with your health care provider about your test results, treatment options, and if necessary, the need for more tests. Vaccines  Your health care provider may recommend certain vaccines, such as: Influenza vaccine. This is recommended every year. Tetanus, diphtheria, and acellular pertussis (Tdap, Td)  vaccine. You may need a Td booster every 10 years. Zoster vaccine. You may need this after age 110. Pneumococcal 13-valent conjugate (PCV13) vaccine. One dose is recommended after age 73. Pneumococcal polysaccharide (PPSV23) vaccine. One dose is recommended after age 71. Talk to your health care provider about which screenings and vaccines you need and how often you need them. This information is not intended to replace advice given to you by your health care provider. Make sure you discuss any questions you have with your health care provider. Document Released: 12/28/2015 Document Revised: 08/20/2016 Document Reviewed: 10/02/2015 Elsevier Interactive Patient Education  2017 [place] Prevention in the Home Falls can cause injuries. They can happen to people of all ages. There are many things you can do to make your home safe and to help prevent falls. What can I do on the outside of my home? Regularly fix the edges of walkways and driveways and fix any cracks. Remove anything that might make you trip as you walk through a door, such as a raised step or threshold. Trim any bushes or trees on the path to your home. Use bright outdoor lighting. Clear any walking paths of anything that might make someone trip, such as rocks or tools. Regularly check to see if handrails are loose or broken. Make sure that both sides of any steps have handrails. Any raised decks and porches should have guardrails on the edges. Have any leaves, snow, or ice cleared regularly. Use sand or salt on walking paths during winter. Clean up any spills in your garage right away. This includes oil or grease spills. What can I do in the bathroom? Use night lights. Install grab bars by the toilet and in the tub and shower. Do not use towel bars as grab bars. Use non-skid mats or decals in the tub or shower. If you need to sit down in the shower, use a plastic, non-slip stool. Keep the floor dry. Clean up any  water that spills on the floor as soon as it happens. Remove soap buildup in the tub or shower regularly. Attach bath mats securely with double-sided non-slip rug tape. Do not have throw rugs and other things on the floor that can make you trip. What can I do in the bedroom? Use night lights. Make sure that you have a light by your bed that is easy to reach. Do not use any sheets or blankets that are too big for your bed. They should not hang down onto the floor. Have a firm chair that has side arms. You can use this for support while you get dressed. Do not have throw rugs and other things on the floor that can make you trip. What can I do in the kitchen? Clean up any spills right away. Avoid walking on wet floors. Keep items that you use a lot in easy-to-reach places. If you need to reach something above you, use a strong step stool that has a grab bar. Keep electrical cords out of the way. Do not use floor polish or wax that makes floors slippery. If you must use wax, use  non-skid floor wax. Do not have throw rugs and other things on the floor that can make you trip. What can I do with my stairs? Do not leave any items on the stairs. Make sure that there are handrails on both sides of the stairs and use them. Fix handrails that are broken or loose. Make sure that handrails are as long as the stairways. Check any carpeting to make sure that it is firmly attached to the stairs. Fix any carpet that is loose or worn. Avoid having throw rugs at the top or bottom of the stairs. If you do have throw rugs, attach them to the floor with carpet tape. Make sure that you have a light switch at the top of the stairs and the bottom of the stairs. If you do not have them, ask someone to add them for you. What else can I do to help prevent falls? Wear shoes that: Do not have high heels. Have rubber bottoms. Are comfortable and fit you well. Are closed at the toe. Do not wear sandals. If you use a  stepladder: Make sure that it is fully opened. Do not climb a closed stepladder. Make sure that both sides of the stepladder are locked into place. Ask someone to hold it for you, if possible. Clearly mark and make sure that you can see: Any grab bars or handrails. First and last steps. Where the edge of each step is. Use tools that help you move around (mobility aids) if they are needed. These include: Canes. Walkers. Scooters. Crutches. Turn on the lights when you go into a dark area. Replace any light bulbs as soon as they burn out. Set up your furniture so you have a clear path. Avoid moving your furniture around. If any of your floors are uneven, fix them. If there are any pets around you, be aware of where they are. Review your medicines with your doctor. Some medicines can make you feel dizzy. This can increase your chance of falling. Ask your doctor what other things that you can do to help prevent falls. This information is not intended to replace advice given to you by your health care provider. Make sure you discuss any questions you have with your health care provider. Document Released: 09/27/2009 Document Revised: 05/08/2016 Document Reviewed: 01/05/2015 Elsevier Interactive Patient Education  2017 [employer].

## 2022-12-18 ENCOUNTER — Ambulatory Visit (INDEPENDENT_AMBULATORY_CARE_PROVIDER_SITE_OTHER): Payer: Medicare HMO | Admitting: Family Medicine

## 2022-12-18 ENCOUNTER — Encounter: Payer: Self-pay | Admitting: Family Medicine

## 2022-12-18 VITALS — BP 128/78 | HR 67 | Temp 98.2°F | Ht 72.0 in | Wt 205.6 lb

## 2022-12-18 DIAGNOSIS — Z8042 Family history of malignant neoplasm of prostate: Secondary | ICD-10-CM

## 2022-12-18 DIAGNOSIS — R739 Hyperglycemia, unspecified: Secondary | ICD-10-CM

## 2022-12-18 DIAGNOSIS — Z0001 Encounter for general adult medical examination with abnormal findings: Secondary | ICD-10-CM

## 2022-12-18 DIAGNOSIS — I10 Essential (primary) hypertension: Secondary | ICD-10-CM

## 2022-12-18 DIAGNOSIS — M06042 Rheumatoid arthritis without rheumatoid factor, left hand: Secondary | ICD-10-CM

## 2022-12-18 DIAGNOSIS — I480 Paroxysmal atrial fibrillation: Secondary | ICD-10-CM

## 2022-12-18 DIAGNOSIS — E785 Hyperlipidemia, unspecified: Secondary | ICD-10-CM | POA: Diagnosis not present

## 2022-12-18 DIAGNOSIS — M06041 Rheumatoid arthritis without rheumatoid factor, right hand: Secondary | ICD-10-CM

## 2022-12-18 LAB — COMPREHENSIVE METABOLIC PANEL
ALT: 20 U/L (ref 0–53)
AST: 19 U/L (ref 0–37)
Albumin: 4.2 g/dL (ref 3.5–5.2)
Alkaline Phosphatase: 74 U/L (ref 39–117)
BUN: 20 mg/dL (ref 6–23)
CO2: 29 mEq/L (ref 19–32)
Calcium: 9.5 mg/dL (ref 8.4–10.5)
Chloride: 104 mEq/L (ref 96–112)
Creatinine, Ser: 1.11 mg/dL (ref 0.40–1.50)
GFR: 66.46 mL/min (ref >60.00)
Glucose, Bld: 96 mg/dL (ref 70–99)
Potassium: 4.9 mEq/L (ref 3.5–5.1)
Sodium: 140 mEq/L (ref 135–145)
Total Bilirubin: 0.9 mg/dL (ref 0.2–1.2)
Total Protein: 6.8 g/dL (ref 6.0–8.3)

## 2022-12-18 LAB — CBC
HCT: 44.9 % (ref 39.0–52.0)
Hemoglobin: 15.1 g/dL (ref 13.0–17.0)
MCHC: 33.6 g/dL (ref 30.0–36.0)
MCV: 104.1 fl — ABNORMAL HIGH (ref 78.0–100.0)
Platelets: 357 10*3/uL (ref 150.0–400.0)
RBC: 4.32 Mil/uL (ref 4.22–5.81)
RDW: 14.6 % (ref 11.5–15.5)
WBC: 5.6 10*3/uL (ref 4.0–10.5)

## 2022-12-18 LAB — PSA: PSA: 1.85 ng/mL (ref 0.10–4.00)

## 2022-12-18 LAB — LIPID PANEL
Cholesterol: 177 mg/dL (ref 0–200)
HDL: 66.5 mg/dL (ref >39.00)
LDL Cholesterol: 97 mg/dL (ref 0–99)
NonHDL: 110.97
Total CHOL/HDL Ratio: 3
Triglycerides: 70 mg/dL (ref 0.0–149.0)
VLDL: 14 mg/dL (ref 0.0–40.0)

## 2022-12-18 LAB — FOLATE: Folate: >23.8 ng/mL (ref >5.9)

## 2022-12-18 LAB — HEMOGLOBIN A1C: Hgb A1c MFr Bld: 6.1 % (ref 4.6–6.5)

## 2022-12-18 LAB — TSH: TSH: 3.97 u[IU]/mL (ref 0.35–5.50)

## 2022-12-18 LAB — VITAMIN B12: Vitamin B-12: 368 pg/mL (ref 211–911)

## 2022-12-18 NOTE — Progress Notes (Signed)
Chief Complaint:  Max [name] is a 72 y.o. male who presents today for his annual comprehensive physical exam.    Assessment/Plan:  Chronic Problems Addressed Today: Dyslipidemia Check lipids.   Hyperglycemia Check A1c.   Rheumatoid arthritis ([name]) Follows with rheumatology.  He is on methotrexate 10 mg weekly.  Check labs today.  Symptoms are stable.   Family history of prostate cancer Check PSA.   Paroxysmal atrial fibrillation (H[facility]) Rate controlled on verapamil and on aspirin 81 mg daily.  He has not been on anticoagulation.  Essential hypertension At goal today on verapamil 180 mg daily.  Preventative Healthcare: Check labs.  Up-to-date on flu shot.  Up-to-date on colon cancer screening.  Patient Counseling(The following topics were reviewed and/or handout was given):  -Nutrition: Stressed importance of moderation in sodium/caffeine intake, saturated fat and cholesterol, caloric balance, sufficient intake of fresh fruits, vegetables, and fiber.  -Stressed the importance of regular exercise.   -Substance Abuse: Discussed cessation/primary prevention of tobacco, alcohol, or other drug use; driving or other dangerous activities under the influence; availability of treatment for abuse.   -Injury prevention: Discussed safety belts, safety helmets, smoke detector, smoking near bedding or upholstery.   -Sexuality: Discussed sexually transmitted diseases, partner selection, use of condoms, avoidance of unintended pregnancy and contraceptive alternatives.   -Dental health: Discussed importance of regular tooth brushing, flossing, and dental visits.  -Health maintenance and immunizations reviewed. Please refer to Health maintenance section.  Return to care in 1 year for next preventative visit.     Subjective:  HPI:  He has no acute complaints today.   Lifestyle Diet: Balanced. Trying to stay away from sweets and carbs.  Exercise: Trying to walk more.      12/18/2022     8:03 AM  Depression screen PHQ 2/9  Decreased Interest 0  Down, Depressed, Hopeless 0  PHQ - 2 Score 0   ROS: Per HPI, otherwise a complete review of systems was negative.   PMH:  The following were reviewed and entered/updated in epic: Past Medical History:  Diagnosis Date   Abdominal hernia 11/28/2015   Allergic rhinitis    Anxiety    BPV (benign positional vertigo), right 05/11/2018   Cardiac arrhythmia    ED (erectile dysfunction)    Herpes    History of syncope    in the setting of anxiety per cardiology note   Hypertension    Kidney tumor (benign), right    sx 02/27/2015   Meningioma ([place])    had surgery    Paroxysmal atrial fibrillation ([name]) 07/15/2012   a) CHADSVASc score 1b) On full-dose ASA and Verapamil   RA (rheumatoid arthritis) ([name])    Seizures ([name]) 05/14/2011   "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    Vasovagal syncope    Patient Active Problem List   Diagnosis Date Noted   Family history of prostate cancer 11/25/2022   Actinic keratosis 07/06/2020   Dyslipidemia 06/21/2019   Hyperglycemia 06/21/2019   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   Pulmonary nodule 01/28/2019   History of cataract 01/28/2019   Paroxysmal atrial fibrillation ([name]) 02/04/2014   History of cold sores 07/14/2012   Rheumatoid arthritis ([name]) 11/23/2009   ERECTILE DYSFUNCTION, 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   Essential hypertension 08/26/2007   Past Surgical History:  Procedure Laterality Date   BRAIN MENINGIOMA EXCISION  2012   CATARACT EXTRACTION W/ INTRAOCULAR LENS IMPLANT  ~ 2001   right   COLONOSCOPY  2008   EYE SURGERY  1964   right; "hit w/baseball; eye hemorrhaged"   [place]  ~ 2003   ROBOTIC ASSITED PARTIAL NEPHRECTOMY Right 02/29/2016   Procedure: XI ROBOTIC ASSITED PARTIAL NEPHRECTOMY;  Surgeon: Alexis Frock, MD;  Location: [facility];  Service: Urology;  Laterality: Right;   NEEDS ULTRASOUND    Family History  Problem Relation Age  of Onset   Hypertension Brother    Prostate cancer Brother    Prostate cancer Paternal Grandfather    Healthy Son    Depression Other    Hypertension Other    GI problems Other    Lung disease Other    Colon cancer Neg Hx    Esophageal cancer Neg Hx    Ulcerative colitis Neg Hx     Medications- reviewed and updated Current Outpatient Medications  Medication Sig Dispense Refill   acyclovir (ZOVIRAX) 800 MG tablet Take 1 tablet (800 mg total) by mouth 3 (three) times daily as needed. For outbreaks 60 tablet 1   Ascorbic Acid (VITAMIN C PO) Take 1 tablet by mouth daily.     aspirin 81 MG chewable tablet Chew by mouth daily.     Cholecalciferol (VITAMIN D3 PO) Take by mouth.     Coenzyme Q10 (CO Q 10 PO) Take by mouth daily.     folic acid (FOLVITE) 330 MCG tablet Take 400 mcg by mouth daily.     methotrexate (RHEUMATREX) 2.5 MG tablet TAKE 4 TABLETS BY MOUTH WEEKLY 90 tablet 3   Multiple Vitamin (MULTIVITAMIN) tablet Take 1 tablet by mouth daily.     Probiotic Product (PROBIOTIC PO) Take by mouth daily.     verapamil (CALAN-SR) 180 MG CR tablet TAKE 1 TABLET BY MOUTH EVERY DAY 90 tablet 0   No current facility-administered medications for this visit.    Allergies-reviewed and updated Allergies  Allergen Reactions   Vicodin [Hydrocodone-Acetaminophen] Nausea And Vomiting    Social History   Socioeconomic History   Marital status: Married    Spouse name: Not on file   Number of children: 1   Years of education: Not on file   Highest education level: Not on file  Occupational History    Comment: [employer]   Tobacco Use   Smoking status: Never    Passive exposure: Past   Smokeless tobacco: Never  Vaping Use   Vaping Use: Never used  Substance and Sexual Activity   Alcohol use: No   Drug use: No   Sexual activity: Yes  Other Topics Concern   Not on file  Social History Narrative   Regular Exercise   Social Determinants of Health   Financial Resource  Strain: Low Risk  (12/09/2022)   Overall Financial Resource Strain (CARDIA)    Difficulty of Paying Living Expenses: Not hard at all  Food Insecurity: No Food Insecurity (12/09/2022)   Hunger Vital Sign    Worried About Running Out of Food in the Last Year: Never true    Ran Out of Food in the Last Year: Never true  Transportation Needs: No Transportation Needs (12/09/2022)   PRAPARE - Hydrologist (Medical): No    Lack of Transportation (Non-Medical): No  Physical Activity: Inactive (12/09/2022)   Exercise Vital Sign    Days of Exercise per Week: 0 days    Minutes of Exercise per Session: 0 min  Stress: No Stress Concern Present (12/09/2022)   [place]  Questionnaire    Feeling of Stress : Not at all  Social Connections: Moderately Isolated (12/09/2022)   Social Connection and Isolation Panel [NHANES]    Frequency of Communication with Friends and Family: Once a week    Frequency of Social Gatherings with Friends and Family: More than three times a week    Attends Religious Services: Never    Marine scientist or Organizations: No    Attends Music therapist: Never    Marital Status: Married        Objective:  Physical Exam: BP 128/78   Pulse 67   Temp 98.2 F (36.8 C) (Temporal)   Ht 6' (1.829 m)   Wt 205 lb 9.6 oz (93.3 kg)   SpO2 97%   BMI 27.88 kg/m   Body mass index is 27.88 kg/m. Wt Readings from Last 3 Encounters:  12/18/22 205 lb 9.6 oz (93.3 kg)  12/09/22 203 lb 3.2 oz (92.2 kg)  11/25/22 203 lb 3.2 oz (92.2 kg)   Gen: NAD, resting comfortably HEENT: TMs normal bilaterally. OP clear. No thyromegaly noted.  CV: Irregular with no murmurs appreciated Pulm: NWOB, CTAB with no crackles, wheezes, or rhonchi GI: Normal bowel sounds present. Soft, Nontender, Nondistended. MSK: no edema, cyanosis, or clubbing noted Skin: warm, dry Neuro: CN2-12 grossly intact.  Strength 5/5 in upper and lower extremities. Reflexes symmetric and intact bilaterally.  Psych: Normal affect and thought content     Lylith Bebeau M. [name], MD 12/18/2022 8:42 AM

## 2022-12-18 NOTE — Assessment & Plan Note (Signed)
Check lipids 

## 2022-12-18 NOTE — Assessment & Plan Note (Signed)
Check PSA. ?

## 2022-12-18 NOTE — Assessment & Plan Note (Addendum)
Follows with rheumatology.  He is on methotrexate 10 mg weekly.  Check labs today.  Symptoms are stable.

## 2022-12-18 NOTE — Assessment & Plan Note (Signed)
Rate controlled on verapamil and on aspirin 81 mg daily.  He has not been on anticoagulation.

## 2022-12-18 NOTE — Progress Notes (Signed)
Please inform patient of the following:  His blood sugar is borderline elevated into the prediabetic range but stable compared to last year. The rest of his labs are all stable compared to last year as well. Do not need to make any changes to his treatment plan at this time. Would like for him to continue to work on diet and exercise and we can recheck in a year.

## 2022-12-18 NOTE — Assessment & Plan Note (Signed)
Check A1c. 

## 2022-12-18 NOTE — Assessment & Plan Note (Signed)
At goal today on verapamil 180 mg daily.

## 2022-12-18 NOTE — Patient Instructions (Signed)
It was very nice to see you today!  We will check blood work today.  Please keep working on diet and exercise.  No medication changes today.  I will see you back in year for your next physical.  Come back sooner if needed.  Take care, Dr [name]  PLEASE NOTE:  If you had any lab tests, please let us know if you have not heard back within a few days. You may see your results on mychart before we have a chance to review them but we will give you a call once they are reviewed by [place].   If we ordered any referrals today, please let us know if you have not heard from their office within the next week.   If you had any urgent prescriptions sent in today, please check with the pharmacy within an hour of our visit to make sure the prescription was transmitted appropriately.   Please try these tips to maintain a healthy lifestyle:  Eat at least 3 REAL meals and 1-2 snacks per day.  Aim for no more than 5 hours between eating.  If you eat breakfast, please do so within one hour of getting up.   Each meal should contain half fruits/vegetables, one quarter protein, and one quarter carbs (no bigger than a computer mouse)  Cut down on sweet beverages. This includes juice, soda, and sweet tea.   Drink at least 1 glass of water with each meal and aim for at least 8 glasses per day  Exercise at least 150 minutes every week.    Preventive Care 20 Years and Older, Male Preventive care refers to lifestyle choices and visits with your health care provider that can promote health and wellness. Preventive care visits are also called wellness exams. What can I expect for my preventive care visit? Counseling During your preventive care visit, your health care provider may ask about your: Medical history, including: Past medical problems. Family medical history. History of falls. Current health, including: Emotional well-being. Home life and relationship well-being. Sexual activity. Memory and  ability to understand (cognition). Lifestyle, including: Alcohol, nicotine or tobacco, and drug use. Access to firearms. Diet, exercise, and sleep habits. Work and work Statistician. Sunscreen use. Safety issues such as seatbelt and bike helmet use. Physical exam Your health care provider will check your: Height and weight. These may be used to calculate your BMI (body mass index). BMI is a measurement that tells if you are at a healthy weight. Waist circumference. This measures the distance around your waistline. This measurement also tells if you are at a healthy weight and may help predict your risk of certain diseases, such as type 2 diabetes and high blood pressure. Heart rate and blood pressure. Body temperature. Skin for abnormal spots. What immunizations do I need?  Vaccines are usually given at various ages, according to a schedule. Your health care provider will recommend vaccines for you based on your age, medical history, and lifestyle or other factors, such as travel or where you work. What tests do I need? Screening Your health care provider may recommend screening tests for certain conditions. This may include: Lipid and cholesterol levels. Diabetes screening. This is done by checking your blood sugar (glucose) after you have not eaten for a while (fasting). Hepatitis C test. Hepatitis B test. HIV (human immunodeficiency virus) test. STI (sexually transmitted infection) testing, if you are at risk. Lung cancer screening. Colorectal cancer screening. Prostate cancer screening. Abdominal aortic aneurysm (AAA) screening. You may  need this if you are a current or former smoker. Talk with your health care provider about your test results, treatment options, and if necessary, the need for more tests. Follow these instructions at home: Eating and drinking  Eat a diet that includes fresh fruits and vegetables, whole grains, lean protein, and low-fat dairy products. Limit your  intake of foods with high amounts of sugar, saturated fats, and salt. Take vitamin and mineral supplements as recommended by your health care provider. Do not drink alcohol if your health care provider tells you not to drink. If you drink alcohol: Limit how much you have to 0-2 drinks a day. Know how much alcohol is in your drink. In the [place] one drink equals one 12 oz bottle of beer (355 mL), one 5 oz glass of wine (148 mL), or one 1 oz glass of hard liquor (44 mL). Lifestyle Brush your teeth every morning and night with fluoride toothpaste. Floss one time each day. Exercise for at least 30 minutes 5 or more days each week. Do not use any products that contain nicotine or tobacco. These products include cigarettes, chewing tobacco, and vaping devices, such as e-cigarettes. If you need help quitting, ask your health care provider. Do not use drugs. If you are sexually active, practice safe sex. Use a condom or other form of protection to prevent STIs. Take aspirin only as told by your health care provider. Make sure that you understand how much to take and what form to take. Work with your health care provider to find out whether it is safe and beneficial for you to take aspirin daily. Ask your health care provider if you need to take a cholesterol-lowering medicine (statin). Find healthy ways to manage stress, such as: Meditation, yoga, or listening to music. Journaling. Talking to a trusted person. Spending time with friends and family. Safety Always wear your seat belt while driving or riding in a vehicle. Do not drive: If you have been drinking alcohol. Do not ride with someone who has been drinking. When you are tired or distracted. While texting. If you have been using any mind-altering substances or drugs. Wear a helmet and other protective equipment during sports activities. If you have firearms in your house, make sure you follow all gun safety procedures. Minimize exposure to  UV radiation to reduce your risk of skin cancer. What's next? Visit your health care provider once a year for an annual wellness visit. Ask your health care provider how often you should have your eyes and teeth checked. Stay up to date on all vaccines. This information is not intended to replace advice given to you by your health care provider. Make sure you discuss any questions you have with your health care provider. Document Revised: 05/29/2021 Document Reviewed: 05/29/2021 Elsevier Patient Education  [place].

## 2022-12-19 ENCOUNTER — Ambulatory Visit: Payer: Medicare HMO | Attending: Physician Assistant | Admitting: Physician Assistant

## 2022-12-19 ENCOUNTER — Encounter: Payer: Self-pay | Admitting: Physician Assistant

## 2022-12-19 VITALS — BP 139/92 | HR 123 | Ht 72.0 in | Wt 205.0 lb

## 2022-12-19 DIAGNOSIS — Z8669 Personal history of other diseases of the nervous system and sense organs: Secondary | ICD-10-CM

## 2022-12-19 DIAGNOSIS — H18891 Other specified disorders of cornea, right eye: Secondary | ICD-10-CM

## 2022-12-19 DIAGNOSIS — M05772 Rheumatoid arthritis with rheumatoid factor of left ankle and foot without organ or systems involvement: Secondary | ICD-10-CM

## 2022-12-19 DIAGNOSIS — E785 Hyperlipidemia, unspecified: Secondary | ICD-10-CM | POA: Diagnosis not present

## 2022-12-19 DIAGNOSIS — Z79899 Other long term (current) drug therapy: Secondary | ICD-10-CM

## 2022-12-19 DIAGNOSIS — I10 Essential (primary) hypertension: Secondary | ICD-10-CM

## 2022-12-19 DIAGNOSIS — M05771 Rheumatoid arthritis with rheumatoid factor of right ankle and foot without organ or systems involvement: Secondary | ICD-10-CM

## 2022-12-19 DIAGNOSIS — L570 Actinic keratosis: Secondary | ICD-10-CM

## 2022-12-19 DIAGNOSIS — M0579 Rheumatoid arthritis with rheumatoid factor of multiple sites without organ or systems involvement: Secondary | ICD-10-CM

## 2022-12-19 DIAGNOSIS — R911 Solitary pulmonary nodule: Secondary | ICD-10-CM

## 2022-12-19 DIAGNOSIS — I480 Paroxysmal atrial fibrillation: Secondary | ICD-10-CM | POA: Diagnosis not present

## 2022-12-19 DIAGNOSIS — M05741 Rheumatoid arthritis with rheumatoid factor of right hand without organ or systems involvement: Secondary | ICD-10-CM | POA: Diagnosis not present

## 2022-12-19 DIAGNOSIS — M05742 Rheumatoid arthritis with rheumatoid factor of left hand without organ or systems involvement: Secondary | ICD-10-CM

## 2022-12-19 MED ORDER — FOLIC ACID 1 MG PO TABS: 1.0000 mg | ORAL_TABLET | Freq: Every day | ORAL | 3 refills | Status: DC

## 2022-12-19 NOTE — Patient Instructions (Signed)
Standing Labs We placed an order today for your standing lab work.   Please have your standing labs drawn in April and every 3 months   Please have your labs drawn 2 weeks prior to your appointment so that the provider can discuss your lab results at your appointment.  Please note that you may see your imaging and lab results in [facility] before we have reviewed them. We will contact you once all results are reviewed. Please allow our office up to 72 hours to thoroughly review all of the results before contacting the office for clarification of your results.  Lab hours are:   Monday through Thursday from 8:00 am -12:30 pm and 1:00 pm-5:00 pm and Friday from 8:00 am-12:00 pm.  Please be advised, all patients with office appointments requiring lab work will take precedent over walk-in lab work.   Labs are drawn by [facility]. Please bring your co-pay at the time of your lab draw.  You may receive a bill from [facility] for your lab work.  Please note if you are on Hydroxychloroquine and and an order has been placed for a Hydroxychloroquine level, you will need to have it drawn 4 hours or more after your last dose.  If you wish to have your labs drawn at another location, please call the office 24 hours in advance so we can fax the orders.  The office is located at [address] [place], [place], [place] [address] No appointment is necessary.    If you have any questions regarding directions or hours of operation,  please call [phone].   As a reminder, please drink plenty of water prior to coming for your lab work. Thanks!

## 2023-01-19 ENCOUNTER — Other Ambulatory Visit: Payer: Self-pay | Admitting: Family Medicine

## 2023-01-29 ENCOUNTER — Other Ambulatory Visit: Payer: Self-pay | Admitting: Family Medicine

## 2023-04-13 NOTE — Progress Notes (Unsigned)
Office Visit Note  Patient: Max [name]             Date of Birth: 1951-02-16           MRN: [id number]             PCP: Ardith Dark, MD Referring: Ardith Dark, MD Visit Date: 04/15/2023 Occupation: @GUAROCC @  Subjective:  Other (Pain and swelling in bilateral hands/wrist)   History of Present Illness: MISHAEL HARAN is a 72 y.o. male with history of seropositive erosive rheumatoid arthritis.  He has been on methotrexate 4 tablets weekly for many years.  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  He states after strenuous work he started experiencing discomfort in his right wrist and his hands.  He notices swelling in his right hand.  He denies discomfort in any other joints.  He denies any morning stiffness.    Activities of Daily Living:  Patient reports morning stiffness for 0 minutes.   Patient Denies nocturnal pain.  Difficulty dressing/grooming: Denies Difficulty climbing stairs: Denies Difficulty getting out of chair: Denies Difficulty using hands for taps, buttons, cutlery, and/or writing: Denies  Review of Systems  Constitutional:  Negative for fatigue.  HENT:  Negative for mouth sores and mouth dryness.   Eyes:  Negative for dryness.  Respiratory:  Negative for shortness of breath.   Cardiovascular:  Negative for chest pain and palpitations.  Gastrointestinal:  Negative for blood in stool, constipation and diarrhea.  Endocrine: Negative for increased urination.  Genitourinary:  Negative for involuntary urination.  Musculoskeletal:  Positive for joint pain, joint pain, joint swelling, myalgias and myalgias. Negative for gait problem, muscle weakness, morning stiffness and muscle tenderness.  Skin:  Negative for color change, rash, hair loss and sensitivity to sunlight.  Allergic/Immunologic: Negative for susceptible to infections.  Neurological:  Negative for dizziness, numbness and headaches.  Hematological:  Negative for swollen glands.   Psychiatric/Behavioral:  Negative for depressed mood and sleep disturbance. The patient is not nervous/anxious.     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   Essential hypertension 08/26/2007    Past Medical History:  Diagnosis Date   Abdominal hernia 11/28/2015   Allergic rhinitis    Anxiety    BPV (benign positional vertigo), right 05/11/2018   Cardiac arrhythmia    ED (erectile dysfunction)    Herpes    History of syncope    in the setting of anxiety per cardiology note   Hypertension    Kidney tumor (benign), right    sx 02/27/2015   Meningioma (HCC)    had surgery    Paroxysmal atrial fibrillation (HCC) 07/15/2012   a) CHADSVASc score 1b) On full-dose ASA and Verapamil   RA (rheumatoid arthritis) (HCC)    Seizures (HCC) 05/14/2011   "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    Vasovagal syncope     Family History  Problem Relation Age of Onset   Hypertension Brother    Prostate cancer Brother    Prostate cancer Paternal Grandfather    Healthy Son  Depression Other    Hypertension Other    GI problems Other    Lung disease Other    Colon cancer Neg Hx    Esophageal cancer Neg Hx    Ulcerative colitis Neg Hx    Past Surgical History:  Procedure Laterality Date   BRAIN MENINGIOMA EXCISION  2012   CATARACT EXTRACTION W/ INTRAOCULAR LENS IMPLANT  ~ 2001   right   COLONOSCOPY  2008   EYE SURGERY  1964   right; "hit w/baseball; eye hemorrhaged"   FLEXIBLE SIGMOIDOSCOPY     REFRACTIVE SURGERY  ~ 2003   ROBOTIC ASSITED PARTIAL NEPHRECTOMY  Right 02/29/2016   Procedure: XI ROBOTIC ASSITED PARTIAL NEPHRECTOMY;  Surgeon: Sebastian Ache, MD;  Location: [facility];  Service: Urology;  Laterality: Right;   NEEDS ULTRASOUND   Social History   Social History Narrative   Regular Exercise   Immunization History  Administered Date(s) Administered   Fluad Quad(high Dose 65+) 11/03/2020, 11/25/2022   Influenza Split 12/18/2011   Influenza Whole 10/18/2009, 10/22/2010   Influenza, High Dose Seasonal PF 11/28/2015   Influenza, Seasonal, Injecte, Preservative Fre 12/20/2012   Influenza,inj,Quad PF,6+ Mos 12/21/2013, 02/08/2015   Influenza-Unspecified 10/10/2018, 11/01/2021   PFIZER Comirnaty(Gray Top)Covid-19 Tri-Sucrose Vaccine 06/30/2021   PFIZER(Purple Top)SARS-COV-2 Vaccination 02/05/2020, 02/28/2020, 11/03/2020   Pneumococcal Conjugate-13 03/30/2017   Pneumococcal Polysaccharide-23 03/01/2016   Td 12/15/1998, 10/18/2009     Objective: Vital Signs: BP (!) 133/92 (BP Location: Left Arm, Patient Position: Sitting, Cuff Size: Normal)   Pulse (!) 103   Resp 17   Ht 6' (1.829 m)   Wt 206 lb 6.4 oz (93.6 kg)   BMI 27.99 kg/m    Physical Exam Vitals and nursing note reviewed.  Constitutional:      Appearance: He is well-developed.  HENT:     Head: Normocephalic and atraumatic.  Eyes:     Conjunctiva/sclera: Conjunctivae normal.     Pupils: Pupils are equal, round, and reactive to light.  Cardiovascular:     Rate and Rhythm: Normal rate and regular rhythm.     Heart sounds: Normal heart sounds.  Pulmonary:     Effort: Pulmonary effort is normal.     Breath sounds: Normal breath sounds.  Abdominal:     General: Bowel sounds are normal.     Palpations: Abdomen is soft.  Musculoskeletal:     Cervical back: Normal range of motion and neck supple.  Skin:    General: Skin is warm and dry.     Capillary Refill: Capillary refill takes less than 2 seconds.  Neurological:     Mental Status: He is alert and oriented to person,  place, and time.  Psychiatric:        Behavior: Behavior normal.      Musculoskeletal Exam: Cervical, thoracic and lumbar spine were in good range of motion.  Shoulder joints, elbow joints and wrist joints were in good range of motion with no synovitis.  He had synovitis over some of the PIP joints as described below.  Hip joints and knee joints in good range of motion.  There was no tenderness over ankles or MTPs.  CDAI Exam: CDAI Score: 6.3  Patient Global: 0 mm; Provider Global: 3 mm Swollen: 3 ; Tender: 3  Joint Exam 04/15/2023      Right  Left  IP  Swollen Tender     PIP 2     Swollen Tender  PIP 4  Swollen Tender  Investigation: No additional findings.  Imaging: No results found.  Recent Labs: Lab Results  Component Value Date   WBC 5.6 12/18/2022   HGB 15.1 12/18/2022   PLT 357.0 12/18/2022   NA 140 12/18/2022   K 4.9 12/18/2022   CL 104 12/18/2022   CO2 29 12/18/2022   GLUCOSE 96 12/18/2022   BUN 20 12/18/2022   CREATININE 1.11 12/18/2022   BILITOT 0.9 12/18/2022   ALKPHOS 74 12/18/2022   AST 19 12/18/2022   ALT 20 12/18/2022   PROT 6.8 12/18/2022   ALBUMIN 4.2 12/18/2022   CALCIUM 9.5 12/18/2022   GFRAA 76 05/06/2021   QFTBGOLDPLUS NEGATIVE 05/06/2021    Speciality Comments: Dxd 2011. MTX x11 yrs. off and on.  Procedures:  No procedures performed Allergies: Vicodin [hydrocodone-acetaminophen]   Assessment / Plan:     Visit Diagnoses: Rheumatoid arthritis with rheumatoid factor of multiple sites without organ or systems involvement (HCC) - +RF,+CCP Ab. dxd 2011.  Severe erosive changes noted on x-rays from 05/06/2021: -Patient has been on methotrexate 4 tablets p.o. weekly for many years.  He has been having intermittent increased pain and swelling in his hands when he does strenuous activities.  He is a Teaching laboratory technician.  He also has intermittent discomfort in his right wrist joint.  He had synovitis over some of the PIP joints as described above.   He had mild elevation of LFTs in the past.  We discussed increasing the dose of methotrexate to 6 tablets p.o. weekly and recheck labs in a month after increasing the dose of methotrexate.  Plan: Sedimentation rate  High risk medication use - Methotrexate 4 tablets by mouth once weekly and folic acid 1 mg daily. -Labs obtained on December 18, 2022 CBC and CMP were normal.  Will check lab today, 1 month and then every 3 months.  Plan: CBC with Differential/Platelet, COMPLETE METABOLIC PANEL WITH GFR.  Information on immunization was placed in the AVS.  He was advised to hold methotrexate if he develops an infection resume after the infection resolves.  Rheumatoid arthritis involving both hands with positive rheumatoid factor (HCC) -synovitis was noted over several of his PIP joints as described above.  Patient denies discomfort in his hands today.  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  Previously followed by hand surgeon.  Rheumatoid arthritis involving both feet with positive rheumatoid factor (HCC) - Erosive changes noted.  He denies any discomfort in his feet.  Paroxysmal atrial fibrillation (HCC) - He is on aspirin 81 mg p.o. daily.  Increased risk of heart disease with the rheumatoid arthritis was discussed.  Dietary modifications and exercise was emphasized.  Essential hypertension-blood pressure was elevated at 138/94.  Repeat blood pressure was obtained.  Patient has been monitoring blood pressure at home which has been normal per patient.  Advised him to follow-up with his PCP if his blood pressure stays elevated.  Defect of right cornea  Pulmonary nodule - Followed by pulmonologist.  Dyslipidemia  History of cataract  Actinic keratosis  Orders: Orders Placed This Encounter  Procedures   CBC with Differential/Platelet   COMPLETE METABOLIC PANEL WITH GFR   Sedimentation rate   Meds ordered this encounter  Medications   methotrexate (RHEUMATREX) 2.5 MG  tablet    Sig: Take 6 tablets by mouth once weekly. Caution:Chemotherapy. Protect from light.    Dispense:  72 tablet    Refill:  0     Follow-Up Instructions: Return in about 3 months (around 07/16/2023)  for Rheumatoid arthritis.   Pollyann Savoy, MD  Note - This record has been created using Animal nutritionist.  Chart creation errors have been sought, but may not always  have been located. Such creation errors do not reflect on  the standard of medical care.

## 2023-04-15 ENCOUNTER — Ambulatory Visit: Payer: Medicare HMO | Attending: Rheumatology | Admitting: Rheumatology

## 2023-04-15 ENCOUNTER — Encounter: Payer: Self-pay | Admitting: Rheumatology

## 2023-04-15 VITALS — BP 133/92 | HR 103 | Resp 17 | Ht 72.0 in | Wt 206.4 lb

## 2023-04-15 DIAGNOSIS — R911 Solitary pulmonary nodule: Secondary | ICD-10-CM

## 2023-04-15 DIAGNOSIS — M0579 Rheumatoid arthritis with rheumatoid factor of multiple sites without organ or systems involvement: Secondary | ICD-10-CM

## 2023-04-15 DIAGNOSIS — L570 Actinic keratosis: Secondary | ICD-10-CM | POA: Diagnosis not present

## 2023-04-15 DIAGNOSIS — H18891 Other specified disorders of cornea, right eye: Secondary | ICD-10-CM

## 2023-04-15 DIAGNOSIS — M05742 Rheumatoid arthritis with rheumatoid factor of left hand without organ or systems involvement: Secondary | ICD-10-CM

## 2023-04-15 DIAGNOSIS — M05741 Rheumatoid arthritis with rheumatoid factor of right hand without organ or systems involvement: Secondary | ICD-10-CM

## 2023-04-15 DIAGNOSIS — E785 Hyperlipidemia, unspecified: Secondary | ICD-10-CM | POA: Diagnosis not present

## 2023-04-15 DIAGNOSIS — Z79899 Other long term (current) drug therapy: Secondary | ICD-10-CM

## 2023-04-15 DIAGNOSIS — I10 Essential (primary) hypertension: Secondary | ICD-10-CM

## 2023-04-15 DIAGNOSIS — I480 Paroxysmal atrial fibrillation: Secondary | ICD-10-CM

## 2023-04-15 DIAGNOSIS — M05772 Rheumatoid arthritis with rheumatoid factor of left ankle and foot without organ or systems involvement: Secondary | ICD-10-CM

## 2023-04-15 DIAGNOSIS — Z8669 Personal history of other diseases of the nervous system and sense organs: Secondary | ICD-10-CM

## 2023-04-15 DIAGNOSIS — M05771 Rheumatoid arthritis with rheumatoid factor of right ankle and foot without organ or systems involvement: Secondary | ICD-10-CM

## 2023-04-15 MED ORDER — METHOTREXATE SODIUM 2.5 MG PO TABS: ORAL_TABLET | ORAL | 0 refills | Status: DC

## 2023-04-15 NOTE — Patient Instructions (Addendum)
Increase methotrexate to 6 tablets by mouth once a week.  Standing Labs We placed an order today for your standing lab work.   Please have your standing labs drawn in 1 month after increasing the dose of methotrexate and then every 3 months.  Please have your labs drawn 2 weeks prior to your appointment so that the provider can discuss your lab results at your appointment, if possible.  Please note that you may see your imaging and lab results in MyChart before we have reviewed them. We will contact you once all results are reviewed. Please allow our office up to 72 hours to thoroughly review all of the results before contacting the office for clarification of your results.  WALK-IN LAB HOURS  Monday through Thursday from 8:00 am -12:30 pm and 1:00 pm-5:00 pm and Friday from 8:00 am-12:00 pm.  Patients with office visits requiring labs will be seen before walk-in labs.  You may encounter longer than normal wait times. Please allow additional time. Wait times may be shorter on  Monday and Thursday afternoons.  We do not book appointments for walk-in labs. We appreciate your patience and understanding with our staff.   Labs are drawn by [facility]. Please bring your co-pay at the time of your lab draw.  You may receive a bill from [facility] for your lab work.  Please note if you are on Hydroxychloroquine and and an order has been placed for a Hydroxychloroquine level,  you will need to have it drawn 4 hours or more after your last dose.  If you wish to have your labs drawn at another location, please call the office 24 hours in advance so we can fax the orders.  The office is located at [address], [address], [place], [place] [address]   If you have any questions regarding directions or hours of operation,  please call [phone].   As a reminder, please drink plenty of water prior to coming for your lab work. Thanks!   Vaccines You are taking a medication(s) that can suppress your  immune system.  The following immunizations are recommended: Flu annually Covid-19  Td/Tdap (tetanus, diphtheria, pertussis) every 10 years Pneumonia (Prevnar 15 then Pneumovax 23 at least 1 year apart.  Alternatively, can take Prevnar 20 without needing additional dose) Shingrix: 2 doses from 4 weeks to 6 months apart  Please check with your PCP to make sure you are up to date.   If you have signs or symptoms of an infection or start antibiotics: First, call your PCP for workup of your infection. Hold your medication through the infection, until you complete your antibiotics, and until symptoms resolve if you take the following: Injectable medication (Actemra, Benlysta, Cimzia, Cosentyx, Enbrel, Humira, Kevzara, Orencia, Remicade, Simponi, Stelara, Taltz, Tremfya) Methotrexate Leflunomide (Arava) Mycophenolate (Cellcept) Harriette Ohara, Olumiant, or Rinvoq  Heart Disease Prevention   Your inflammatory disease increases your risk of heart disease which includes heart attack, stroke, atrial fibrillation (irregular heartbeats), high blood pressure, heart failure and atherosclerosis (plaque in the arteries).  It is important to reduce your risk by:   Keep blood pressure, cholesterol, and blood sugar at healthy levels   Smoking Cessation   Maintain a healthy weight  BMI 20-25   Eat a healthy diet  Plenty of fresh fruit, vegetables, and whole grains  Limit saturated fats, foods high in sodium, and added sugars  DASH and Mediterranean diet   Increase physical activity  Recommend moderate physically activity for 150 minutes per week/  30 minutes a day for five days a week These can be broken up into three separate ten-minute sessions during the day.   Reduce Stress  Meditation, slow breathing exercises, yoga, coloring books  Dental visits twice a year

## 2023-04-16 LAB — CBC WITH DIFFERENTIAL/PLATELET
Absolute Monocytes: 353 cells/uL (ref 200–950)
Basophils Absolute: 52 cells/uL (ref 0–200)
Basophils Relative: 1.1 %
Eosinophils Absolute: 19 cells/uL (ref 15–500)
Eosinophils Relative: 0.4 %
HCT: 43.2 % (ref 38.5–50.0)
Hemoglobin: 14.5 g/dL (ref 13.2–17.1)
Lymphs Abs: 1090 cells/uL (ref 850–3900)
MCH: 34 pg — ABNORMAL HIGH (ref 27.0–33.0)
MCHC: 33.6 g/dL (ref 32.0–36.0)
MCV: 101.4 fL — ABNORMAL HIGH (ref 80.0–100.0)
MPV: 10.6 fL (ref 7.5–12.5)
Monocytes Relative: 7.5 %
Neutro Abs: 3187 cells/uL (ref 1500–7800)
Neutrophils Relative %: 67.8 %
Platelets: 360 10*3/uL (ref 140–400)
RBC: 4.26 10*6/uL (ref 4.20–5.80)
RDW: 13.5 % (ref 11.0–15.0)
Total Lymphocyte: 23.2 %
WBC: 4.7 10*3/uL (ref 3.8–10.8)

## 2023-04-16 LAB — COMPLETE METABOLIC PANEL WITH GFR
AG Ratio: 1.6 (calc) (ref 1.0–2.5)
ALT: 19 U/L (ref 9–46)
AST: 20 U/L (ref 10–35)
Albumin: 4.3 g/dL (ref 3.6–5.1)
Alkaline phosphatase (APISO): 83 U/L (ref 35–144)
BUN: 22 mg/dL (ref 7–25)
CO2: 25 mmol/L (ref 20–32)
Calcium: 9.5 mg/dL (ref 8.6–10.3)
Chloride: 105 mmol/L (ref 98–110)
Creat: 1.01 mg/dL (ref 0.70–1.28)
Globulin: 2.7 g/dL (calc) (ref 1.9–3.7)
Glucose, Bld: 91 mg/dL (ref 65–99)
Potassium: 5.2 mmol/L (ref 3.5–5.3)
Sodium: 140 mmol/L (ref 135–146)
Total Bilirubin: 0.8 mg/dL (ref 0.2–1.2)
Total Protein: 7 g/dL (ref 6.1–8.1)
eGFR: 79 mL/min/{1.73_m2} (ref >=60)

## 2023-04-16 LAB — SEDIMENTATION RATE: Sed Rate: 2 mm/h (ref 0–20)

## 2023-05-07 ENCOUNTER — Other Ambulatory Visit: Payer: Self-pay | Admitting: Family Medicine

## 2023-05-27 ENCOUNTER — Ambulatory Visit: Payer: Medicare HMO | Admitting: Rheumatology

## 2023-06-06 ENCOUNTER — Other Ambulatory Visit: Payer: Self-pay | Admitting: Family Medicine

## 2023-07-02 NOTE — Progress Notes (Unsigned)
Office Visit Note  Patient: Max [name]             Date of Birth: December 10, 1951           MRN: [id number]             PCP: Ardith Dark, MD Referring: Ardith Dark, MD Visit Date: 07/16/2023 Occupation: @GUAROCC @  Subjective:  Medication monitoring   History of Present Illness: Max [name] is a 72 y.o. male with history of seropositive rheumatoid arthritis.  Patient is currently taking Methotrexate 6 tablets by mouth once weekly and folic acid 1 mg daily.  The dose of methotrexate was increased at his last office visit on 04/15/23.  He has been tolerating the increased dose of methotrexate without any side effects.  He has noticed significant clinical improvement since increasing the dose of methotrexate.  Patient continues to have intermittent flares.  Patient states that about 1 month after increasing the dose of methotrexate he had discomfort in his right shoulder which then migrated to his left shoulder.  He states he recently had increased pain and stiffness in his left wrist joint.  He states last week he was also having swelling in the right third PIP joint.  His flares are typically exacerbated by overuse or repetitive activities with work.  Patient denies any increased joint pain or joint swelling currently.  He denies any morning stiffness.  He states that he does not overdo it he does not have any pain or swelling in his joints. He denies any recent or recurrent infections. He denies any new medical conditions.     Activities of Daily Living:  Patient reports morning stiffness for 0 minutes.   Patient Denies nocturnal pain.  Difficulty dressing/grooming: Denies Difficulty climbing stairs: Denies Difficulty getting out of chair: Denies Difficulty using hands for taps, buttons, cutlery, and/or writing: Denies  Review of Systems  Constitutional:  Negative for fatigue.  HENT:  Negative for mouth sores and mouth dryness.   Eyes:  Negative for dryness.  Respiratory:   Negative for shortness of breath.   Cardiovascular:  Positive for palpitations. Negative for chest pain.  Gastrointestinal:  Negative for blood in stool, constipation and diarrhea.  Endocrine: Negative for increased urination.  Genitourinary:  Negative for involuntary urination.  Musculoskeletal:  Positive for joint pain, joint pain and joint swelling. Negative for gait problem, myalgias, muscle weakness, morning stiffness, muscle tenderness and myalgias.  Skin:  Negative for color change, rash and sensitivity to sunlight.  Allergic/Immunologic: Negative for susceptible to infections.  Neurological:  Negative for dizziness and headaches.  Hematological:  Negative for swollen glands.  Psychiatric/Behavioral:  Negative for depressed mood and sleep disturbance. The patient is not nervous/anxious.     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   Essential hypertension 08/26/2007    Past Medical History:  Diagnosis Date   Abdominal hernia 11/28/2015   Allergic rhinitis    Anxiety    BPV (benign positional vertigo), right 05/11/2018   Cardiac arrhythmia    ED (erectile dysfunction)    Herpes  History of syncope    in the setting of anxiety per cardiology note   Hypertension    Kidney tumor (benign), right    sx 02/27/2015   Meningioma (HCC)    had surgery    Paroxysmal atrial fibrillation (HCC) 07/15/2012   a) CHADSVASc score 1b) On full-dose ASA and Verapamil   RA (rheumatoid arthritis) (HCC)    Seizures (HCC) 05/14/2011   "first and only" secondary to  meningioma    Vasovagal syncope     Family History  Problem Relation Age of Onset   Hypertension Brother    Prostate cancer Brother    Prostate cancer Paternal Grandfather    Healthy Son    Depression Other    Hypertension Other    GI problems Other    Lung disease Other    Colon cancer Neg Hx    Esophageal cancer Neg Hx    Ulcerative colitis Neg Hx    Past Surgical History:  Procedure Laterality Date   BRAIN MENINGIOMA EXCISION  2012   CATARACT EXTRACTION W/ INTRAOCULAR LENS IMPLANT  ~ 2001   right   COLONOSCOPY  2008   EYE SURGERY  1964   right; "hit w/baseball; eye hemorrhaged"   FLEXIBLE SIGMOIDOSCOPY     REFRACTIVE SURGERY  ~ 2003   ROBOTIC ASSITED PARTIAL NEPHRECTOMY Right 02/29/2016   Procedure: XI ROBOTIC ASSITED PARTIAL NEPHRECTOMY;  Surgeon: Sebastian Ache, MD;  Location: [facility];  Service: Urology;  Laterality: Right;   NEEDS ULTRASOUND   Social History   Social History Narrative   Regular Exercise   Immunization History  Administered Date(s) Administered   Fluad Quad(high Dose 65+) 11/03/2020, 11/25/2022   Influenza Split 12/18/2011   Influenza Whole 10/18/2009, 10/22/2010   Influenza, High Dose Seasonal PF 11/28/2015   Influenza, Seasonal, Injecte, Preservative Fre 12/20/2012   Influenza,inj,Quad PF,6+ Mos 12/21/2013, 02/08/2015   Influenza-Unspecified 10/10/2018, 11/01/2021   PFIZER Comirnaty(Gray Top)Covid-19 Tri-Sucrose Vaccine 06/30/2021   PFIZER(Purple Top)SARS-COV-2 Vaccination 02/05/2020, 02/28/2020, 11/03/2020   Pneumococcal Conjugate-13 03/30/2017   Pneumococcal Polysaccharide-23 03/01/2016   Td 12/15/1998, 10/18/2009     Objective: Vital Signs: BP 131/88 (BP Location: Left Arm, Patient Position: Sitting, Cuff Size: Normal)   Pulse (!) 120   Resp 15   Ht 6' (1.829 m)   Wt 206 lb (93.4 kg)   BMI 27.94 kg/m    Physical Exam Vitals and nursing note reviewed.  Constitutional:      Appearance: He is well-developed.  HENT:     Head:  Normocephalic and atraumatic.  Eyes:     Conjunctiva/sclera: Conjunctivae normal.     Pupils: Pupils are equal, round, and reactive to light.  Cardiovascular:     Rate and Rhythm: Rhythm irregular.     Heart sounds: Normal heart sounds.  Pulmonary:     Effort: Pulmonary effort is normal.     Breath sounds: Normal breath sounds.  Abdominal:     General: Bowel sounds are normal.     Palpations: Abdomen is soft.  Musculoskeletal:     Cervical back: Normal range of motion and neck supple.  Skin:    General: Skin is warm and dry.     Capillary Refill: Capillary refill takes less than 2 seconds.  Neurological:     Mental Status: He is alert and oriented to person, place, and time.  Psychiatric:        Behavior: Behavior normal.      Musculoskeletal Exam: C-spine, thoracic spine, and lumbar spine good ROM.  Shoulder joints, elbow joints, wrist joints have good ROM with no discomfort. Mild tenderness and inflammation in right 3rd PIP joint.  Complete fist formation.  Hip joints have good ROM with no groin pain.  Knee joints have good ROM with no warmth or effusion.  Ankle joints have good ROM with no tenderness.   CDAI Exam: CDAI Score: 2  Patient Global: 0 / 100; Provider Global: 0 / 100 Swollen: 1 ; Tender: 1  Joint Exam 07/16/2023      Right  Left  PIP 3 (finger)  Swollen Tender        Investigation: No additional findings.  Imaging: No results found.  Recent Labs: Lab Results  Component Value Date   WBC 4.7 04/15/2023   HGB 14.5 04/15/2023   PLT 360 04/15/2023   NA 140 04/15/2023   K 5.2 04/15/2023   CL 105 04/15/2023   CO2 25 04/15/2023   GLUCOSE 91 04/15/2023   BUN 22 04/15/2023   CREATININE 1.01 04/15/2023   BILITOT 0.8 04/15/2023   ALKPHOS 74 12/18/2022   AST 20 04/15/2023   ALT 19 04/15/2023   PROT 7.0 04/15/2023   ALBUMIN 4.2 12/18/2022   CALCIUM 9.5 04/15/2023   GFRAA 76 05/06/2021   QFTBGOLDPLUS NEGATIVE 05/06/2021    Speciality Comments: Dxd  2011. MTX x11 yrs. off and on.  Procedures:  No procedures performed Allergies: Vicodin [hydrocodone-acetaminophen]   Assessment / Plan:     Visit Diagnoses: Rheumatoid arthritis with rheumatoid factor of multiple sites without organ or systems involvement (HCC) - +RF,+CCP Ab. dxd 2011.  Severe erosive changes noted on x-rays from 05/06/2021: Patient has noticed a significant improvement in his symptoms since increasing the dose of methotrexate from 4 tablets to 6 tablets once weekly after his last office visit on 04/15/2023.  He continues to have migratory discomfort which is exacerbated by repetitive or overuse activities while at work.  About a month after increasing the dose of methotrexate he had increased discomfort in his right shoulder which then migrated to his left shoulder.  He recently had a flare involving the left wrist after removing a windshield.  He states last week he had pain and swelling in the right third PIP joint which has since subsided.  He has mild inflammation and tenderness over the right third PIP joint today.  He has not been experiencing any morning stiffness or nocturnal pain.  Discussed switching to injectable methotrexate to try to increase the efficacy but he would like to hold off at this time.  Also discussed possibly increasing the dose of methotrexate from 6 tablets to 8 tablets once weekly with close lab monitoring which she was open to.  CBC and CMP will be checked today prior to increasing the dose of methotrexate.  He will follow-up in the office in 3 months or sooner if needed.  High risk medication use - Methotrexate 6 tablets by mouth once weekly and folic acid 1 mg daily. CBC and CMP updated on 04/15/23. Orders for CBC and CMP released today. His next lab work will be due in November and every 3 months. Standing orders for CBC and CMP placed today.  No recent or recurrent infections.  Discussed the importance of holding methotrexate if he develops signs or  symptoms of an infection and to resume once the infection has completley cleared.  - Plan: CBC with Differential/Platelet, COMPLETE METABOLIC PANEL WITH GFR, CBC with Differential/Platelet, COMPLETE METABOLIC PANEL WITH GFR  Rheumatoid arthritis involving both hands with  positive rheumatoid factor [facility]): Patient had a recent flare involving his left wrist and last week had pain and swelling in the left third PIP joint.  He has flares are typically exacerbated by overuse or repetitive activities with work.  He has noticed a significant improvement since increasing the dose of methotrexate from 4 tablets to 6 tablets weekly after his last office visit on 04/15/2023.  Discussed possibly increasing the dose of methotrexate to 8 tablets weekly pending lab results today.  We also discussed possibly switching to injectable methotrexate but he would like to hold off at this time.  Rheumatoid arthritis involving both feet with positive rheumatoid factor (HCC): He is not experiencing any increased discomfort in his feet.  He wears proper fitting shoes.  He has no tenderness or inflammation in his ankle joints.  Other medical conditions are listed as follows:   Paroxysmal atrial fibrillation (HCC) -He is taking aspirin 81 mg p.o. daily.  Essential hypertension: BP 131/88 today in the office.   Defect of right cornea  Pulmonary nodule  Dyslipidemia  History of cataract  Actinic keratosis  Orders: Orders Placed This Encounter  Procedures   CBC with Differential/Platelet   COMPLETE METABOLIC PANEL WITH GFR   CBC with Differential/Platelet   COMPLETE METABOLIC PANEL WITH GFR     Follow-Up Instructions: Return in about 3 months (around 10/16/2023) for Rheumatoid arthritis.   Gearldine Bienenstock, PA-C  Note - This record has been created using Dragon software.  Chart creation errors have been sought, but may not always  have been located. Such creation errors do not reflect on  the standard of medical  care.

## 2023-07-08 DIAGNOSIS — H5203 Hypermetropia, bilateral: Secondary | ICD-10-CM | POA: Diagnosis not present

## 2023-07-08 DIAGNOSIS — H43813 Vitreous degeneration, bilateral: Secondary | ICD-10-CM | POA: Diagnosis not present

## 2023-07-08 DIAGNOSIS — H2512 Age-related nuclear cataract, left eye: Secondary | ICD-10-CM | POA: Diagnosis not present

## 2023-07-08 DIAGNOSIS — H52203 Unspecified astigmatism, bilateral: Secondary | ICD-10-CM | POA: Diagnosis not present

## 2023-07-08 DIAGNOSIS — H25012 Cortical age-related cataract, left eye: Secondary | ICD-10-CM | POA: Diagnosis not present

## 2023-07-08 DIAGNOSIS — Z961 Presence of intraocular lens: Secondary | ICD-10-CM | POA: Diagnosis not present

## 2023-07-16 ENCOUNTER — Encounter: Payer: Self-pay | Admitting: Physician Assistant

## 2023-07-16 ENCOUNTER — Ambulatory Visit: Payer: Medicare HMO | Attending: Physician Assistant | Admitting: Physician Assistant

## 2023-07-16 VITALS — BP 131/88 | HR 120 | Resp 15 | Ht 72.0 in | Wt 206.0 lb

## 2023-07-16 DIAGNOSIS — L570 Actinic keratosis: Secondary | ICD-10-CM | POA: Diagnosis not present

## 2023-07-16 DIAGNOSIS — M0579 Rheumatoid arthritis with rheumatoid factor of multiple sites without organ or systems involvement: Secondary | ICD-10-CM

## 2023-07-16 DIAGNOSIS — I10 Essential (primary) hypertension: Secondary | ICD-10-CM

## 2023-07-16 DIAGNOSIS — Z8669 Personal history of other diseases of the nervous system and sense organs: Secondary | ICD-10-CM

## 2023-07-16 DIAGNOSIS — E785 Hyperlipidemia, unspecified: Secondary | ICD-10-CM | POA: Diagnosis not present

## 2023-07-16 DIAGNOSIS — M05742 Rheumatoid arthritis with rheumatoid factor of left hand without organ or systems involvement: Secondary | ICD-10-CM

## 2023-07-16 DIAGNOSIS — R911 Solitary pulmonary nodule: Secondary | ICD-10-CM | POA: Diagnosis not present

## 2023-07-16 DIAGNOSIS — M05741 Rheumatoid arthritis with rheumatoid factor of right hand without organ or systems involvement: Secondary | ICD-10-CM | POA: Diagnosis not present

## 2023-07-16 DIAGNOSIS — M05771 Rheumatoid arthritis with rheumatoid factor of right ankle and foot without organ or systems involvement: Secondary | ICD-10-CM

## 2023-07-16 DIAGNOSIS — H18891 Other specified disorders of cornea, right eye: Secondary | ICD-10-CM

## 2023-07-16 DIAGNOSIS — M05772 Rheumatoid arthritis with rheumatoid factor of left ankle and foot without organ or systems involvement: Secondary | ICD-10-CM

## 2023-07-16 DIAGNOSIS — I480 Paroxysmal atrial fibrillation: Secondary | ICD-10-CM | POA: Diagnosis not present

## 2023-07-16 DIAGNOSIS — Z79899 Other long term (current) drug therapy: Secondary | ICD-10-CM | POA: Diagnosis not present

## 2023-07-16 NOTE — Patient Instructions (Signed)
Standing Labs We placed an order today for your standing lab work.   Please have your standing labs drawn in November and every 3 months   Please have your labs drawn 2 weeks prior to your appointment so that the provider can discuss your lab results at your appointment, if possible.  Please note that you may see your imaging and lab results in MyChart before we have reviewed them. We will contact you once all results are reviewed. Please allow our office up to 72 hours to thoroughly review all of the results before contacting the office for clarification of your results.  WALK-IN LAB HOURS  Monday through Thursday from 8:00 am -12:30 pm and 1:00 pm-5:00 pm and Friday from 8:00 am-12:00 pm.  Patients with office visits requiring labs will be seen before walk-in labs.  You may encounter longer than normal wait times. Please allow additional time. Wait times may be shorter on  Monday and Thursday afternoons.  We do not book appointments for walk-in labs. We appreciate your patience and understanding with our staff.   Labs are drawn by [facility]. Please bring your co-pay at the time of your lab draw.  You may receive a bill from [facility] for your lab work.  Please note if you are on Hydroxychloroquine and and an order has been placed for a Hydroxychloroquine level,  you will need to have it drawn 4 hours or more after your last dose.  If you wish to have your labs drawn at another location, please call the office 24 hours in advance so we can fax the orders.  The office is located at [address], [place], [place] [address]   If you have any questions regarding directions or hours of operation,  please call [phone].   As a reminder, please drink plenty of water prior to coming for your lab work. Thanks!

## 2023-07-17 ENCOUNTER — Other Ambulatory Visit: Payer: Self-pay | Admitting: *Deleted

## 2023-07-17 DIAGNOSIS — Z79899 Other long term (current) drug therapy: Secondary | ICD-10-CM

## 2023-07-17 MED ORDER — METHOTREXATE SODIUM 2.5 MG PO TABS: 20.0000 mg | ORAL_TABLET | ORAL | Status: DC

## 2023-07-17 NOTE — Progress Notes (Signed)
CMP WNL MCV and MCH are elevated.  Please ensure the patient is taking folic acid 2 mg daily. Ok to increase methotrexate to 8 tablets weekly.  Recommend rechecking lab work in 2 weeks.

## 2023-07-17 NOTE — Telephone Encounter (Signed)
-----   Message from Gearldine Bienenstock sent at 07/17/2023  7:28 AM EDT ----- CMP WNL MCV and MCH are elevated.  Please ensure the patient is taking folic acid 2 mg daily. Ok to increase methotrexate to 8 tablets weekly.  Recommend rechecking lab work in 2 weeks.

## 2023-07-19 DIAGNOSIS — Z0100 Encounter for examination of eyes and vision without abnormal findings: Secondary | ICD-10-CM | POA: Diagnosis not present

## 2023-08-03 ENCOUNTER — Other Ambulatory Visit: Payer: Self-pay | Admitting: *Deleted

## 2023-08-03 DIAGNOSIS — Z79899 Other long term (current) drug therapy: Secondary | ICD-10-CM

## 2023-08-04 ENCOUNTER — Other Ambulatory Visit: Payer: Self-pay | Admitting: *Deleted

## 2023-08-04 ENCOUNTER — Telehealth: Payer: Self-pay | Admitting: *Deleted

## 2023-08-04 LAB — CBC WITH DIFFERENTIAL/PLATELET
Absolute Monocytes: 626 {cells}/uL (ref 200–950)
Basophils Absolute: 81 {cells}/uL (ref 0–200)
Basophils Relative: 1.4 %
Eosinophils Absolute: 70 {cells}/uL (ref 15–500)
Eosinophils Relative: 1.2 %
HCT: 43.3 % (ref 38.5–50.0)
Hemoglobin: 14.8 g/dL (ref 13.2–17.1)
Lymphs Abs: 1056 {cells}/uL (ref 850–3900)
MCH: 35.1 pg — ABNORMAL HIGH (ref 27.0–33.0)
MCHC: 34.2 g/dL (ref 32.0–36.0)
MCV: 102.6 fL — ABNORMAL HIGH (ref 80.0–100.0)
MPV: 10.9 fL (ref 7.5–12.5)
Monocytes Relative: 10.8 %
Neutro Abs: 3967 cells/uL (ref 1500–7800)
Neutrophils Relative %: 68.4 %
Platelets: 349 10*3/uL (ref 140–400)
RBC: 4.22 10*6/uL (ref 4.20–5.80)
RDW: 13.9 % (ref 11.0–15.0)
Total Lymphocyte: 18.2 %
WBC: 5.8 10*3/uL (ref 3.8–10.8)

## 2023-08-04 LAB — COMPLETE METABOLIC PANEL WITH GFR
AG Ratio: 1.6 (calc) (ref 1.0–2.5)
ALT: 17 U/L (ref 9–46)
AST: 18 U/L (ref 10–35)
Albumin: 4.3 g/dL (ref 3.6–5.1)
Alkaline phosphatase (APISO): 74 U/L (ref 35–144)
BUN: 17 mg/dL (ref 7–25)
CO2: 27 mmol/L (ref 20–32)
Calcium: 9.8 mg/dL (ref 8.6–10.3)
Chloride: 107 mmol/L (ref 98–110)
Creat: 1.2 mg/dL (ref 0.70–1.28)
Globulin: 2.7 g/dL (ref 1.9–3.7)
Glucose, Bld: 93 mg/dL (ref 65–99)
Potassium: 4.9 mmol/L (ref 3.5–5.3)
Sodium: 143 mmol/L (ref 135–146)
Total Bilirubin: 0.7 mg/dL (ref 0.2–1.2)
Total Protein: 7 g/dL (ref 6.1–8.1)
eGFR: 64 mL/min/{1.73_m2} (ref >=60)

## 2023-08-04 MED ORDER — FOLIC ACID 1 MG PO TABS: 2.0000 mg | ORAL_TABLET | Freq: Every day | ORAL | 3 refills | Status: DC

## 2023-08-04 NOTE — Telephone Encounter (Deleted)
-----   Message from Gearldine Bienenstock sent at 08/04/2023  6:45 AM EDT ----- MCV and MCH remain elevated but have improved. Continue to take folic acid as prescribed. Rest of CBC WNL

## 2023-08-04 NOTE — Telephone Encounter (Signed)
Error

## 2023-08-04 NOTE — Progress Notes (Signed)
CMP WNL

## 2023-08-04 NOTE — Progress Notes (Signed)
MCV and MCH remain elevated but have improved. Continue to take folic acid as prescribed. Rest of CBC WNL

## 2023-08-17 ENCOUNTER — Other Ambulatory Visit: Payer: Self-pay | Admitting: Rheumatology

## 2023-08-17 ENCOUNTER — Other Ambulatory Visit: Payer: Self-pay | Admitting: Family Medicine

## 2023-08-18 NOTE — Telephone Encounter (Signed)
Last Fill: 04/15/2023  Labs: 08/03/2023 CMP WNL MCV and MCH remain elevated but have improved. Rest of CBC WNL   Next Visit: 10/16/2023  Last Visit: 07/16/2023  DX: Rheumatoid arthritis with rheumatoid factor of multiple sites without organ or systems involvement   Current Dose per lab note 07/16/2023: Ok to increase methotrexate to 8 tablets weekly.   Okay to refill Methotrexate?

## 2023-10-16 ENCOUNTER — Ambulatory Visit: Payer: Medicare HMO | Attending: Physician Assistant | Admitting: Physician Assistant

## 2023-10-16 ENCOUNTER — Encounter: Payer: Self-pay | Admitting: Physician Assistant

## 2023-10-16 VITALS — BP 136/92 | HR 83 | Resp 15 | Ht 72.0 in | Wt 204.0 lb

## 2023-10-16 DIAGNOSIS — M05741 Rheumatoid arthritis with rheumatoid factor of right hand without organ or systems involvement: Secondary | ICD-10-CM | POA: Diagnosis not present

## 2023-10-16 DIAGNOSIS — R911 Solitary pulmonary nodule: Secondary | ICD-10-CM

## 2023-10-16 DIAGNOSIS — I480 Paroxysmal atrial fibrillation: Secondary | ICD-10-CM

## 2023-10-16 DIAGNOSIS — Z79899 Other long term (current) drug therapy: Secondary | ICD-10-CM

## 2023-10-16 DIAGNOSIS — E785 Hyperlipidemia, unspecified: Secondary | ICD-10-CM

## 2023-10-16 DIAGNOSIS — L570 Actinic keratosis: Secondary | ICD-10-CM | POA: Diagnosis not present

## 2023-10-16 DIAGNOSIS — I10 Essential (primary) hypertension: Secondary | ICD-10-CM | POA: Diagnosis not present

## 2023-10-16 DIAGNOSIS — H18891 Other specified disorders of cornea, right eye: Secondary | ICD-10-CM

## 2023-10-16 DIAGNOSIS — M0579 Rheumatoid arthritis with rheumatoid factor of multiple sites without organ or systems involvement: Secondary | ICD-10-CM | POA: Diagnosis not present

## 2023-10-16 DIAGNOSIS — M05742 Rheumatoid arthritis with rheumatoid factor of left hand without organ or systems involvement: Secondary | ICD-10-CM

## 2023-10-16 DIAGNOSIS — M05771 Rheumatoid arthritis with rheumatoid factor of right ankle and foot without organ or systems involvement: Secondary | ICD-10-CM | POA: Diagnosis not present

## 2023-10-16 DIAGNOSIS — M05772 Rheumatoid arthritis with rheumatoid factor of left ankle and foot without organ or systems involvement: Secondary | ICD-10-CM

## 2023-10-16 DIAGNOSIS — Z8669 Personal history of other diseases of the nervous system and sense organs: Secondary | ICD-10-CM | POA: Diagnosis not present

## 2023-10-16 NOTE — Patient Instructions (Signed)

## 2023-10-17 LAB — COMPLETE METABOLIC PANEL WITH GFR
AG Ratio: 1.8 (calc) (ref 1.0–2.5)
ALT: 19 U/L (ref 9–46)
AST: 20 U/L (ref 10–35)
Albumin: 4.3 g/dL (ref 3.6–5.1)
Alkaline phosphatase (APISO): 81 U/L (ref 35–144)
BUN: 19 mg/dL (ref 7–25)
CO2: 28 mmol/L (ref 20–32)
Calcium: 9.4 mg/dL (ref 8.6–10.3)
Chloride: 105 mmol/L (ref 98–110)
Creat: 1.14 mg/dL (ref 0.70–1.28)
Globulin: 2.4 g/dL (ref 1.9–3.7)
Glucose, Bld: 102 mg/dL — ABNORMAL HIGH (ref 65–99)
Potassium: 4.5 mmol/L (ref 3.5–5.3)
Sodium: 139 mmol/L (ref 135–146)
Total Bilirubin: 0.8 mg/dL (ref 0.2–1.2)
Total Protein: 6.7 g/dL (ref 6.1–8.1)
eGFR: 68 mL/min/{1.73_m2} (ref >=60)

## 2023-10-17 LAB — CBC WITH DIFFERENTIAL/PLATELET
Absolute Lymphocytes: 1172 {cells}/uL (ref 850–3900)
Absolute Monocytes: 505 {cells}/uL (ref 200–950)
Basophils Absolute: 70 {cells}/uL (ref 0–200)
Basophils Relative: 1.2 %
Eosinophils Absolute: 41 {cells}/uL (ref 15–500)
Eosinophils Relative: 0.7 %
HCT: 44.6 % (ref 38.5–50.0)
Hemoglobin: 14.9 g/dL (ref 13.2–17.1)
MCH: 34.8 pg — ABNORMAL HIGH (ref 27.0–33.0)
MCHC: 33.4 g/dL (ref 32.0–36.0)
MCV: 104.2 fL — ABNORMAL HIGH (ref 80.0–100.0)
MPV: 11.1 fL (ref 7.5–12.5)
Monocytes Relative: 8.7 %
Neutro Abs: 4014 {cells}/uL (ref 1500–7800)
Neutrophils Relative %: 69.2 %
Platelets: 334 10*3/uL (ref 140–400)
RBC: 4.28 10*6/uL (ref 4.20–5.80)
RDW: 13.6 % (ref 11.0–15.0)
Total Lymphocyte: 20.2 %
WBC: 5.8 10*3/uL (ref 3.8–10.8)

## 2023-10-18 NOTE — Progress Notes (Signed)
CMP WNL MCV and MCH remain elevated- continue folic acid 2 mg daily.  Rest of CBC WNL.  We will continue to monitor.

## 2023-11-04 ENCOUNTER — Other Ambulatory Visit: Payer: Self-pay | Admitting: Physician Assistant

## 2023-11-04 NOTE — Telephone Encounter (Signed)
Last Fill: 08/18/2023  Labs: 10/16/2023  CMP WNL MCV and MCH remain elevated- continue folic acid 2 mg daily. Rest of CBC WNL.  We will continue to monitor.  Next Visit: 03/15/2024  Last Visit: 10/16/2023  DX: Rheumatoid arthritis with rheumatoid factor of multiple sites without organ or systems involvement   Current Dose per office note 10/16/2023: Methotrexate 8 tablets by mouth once weekly   Okay to refill Methotrexate?

## 2024-01-05 ENCOUNTER — Ambulatory Visit (INDEPENDENT_AMBULATORY_CARE_PROVIDER_SITE_OTHER): Payer: Medicare HMO

## 2024-01-05 VITALS — BP 132/80 | HR 98 | Temp 98.7°F | Wt 211.6 lb

## 2024-01-05 DIAGNOSIS — Z0000 Encounter for general adult medical examination without abnormal findings: Secondary | ICD-10-CM

## 2024-01-05 NOTE — Progress Notes (Signed)
Subjective:   Max [name] is a 73 y.o. male who presents for Medicare Annual/Subsequent preventive examination.  Visit Complete: In person   Cardiac Risk Factors include: advanced age (>5men, >37 women);dyslipidemia;hypertension;male gender     Objective:    Today's Vitals   01/05/24 0755  BP: 132/80  Pulse: 98  Temp: 98.7 F (37.1 C)  SpO2: 96%  Weight: 211 lb 9.6 oz (96 kg)   Body mass index is 28.7 kg/m.     01/05/2024    8:02 AM 12/09/2022    9:11 AM 11/18/2021    8:11 AM 11/12/2020    8:18 AM 12/12/2019    9:22 AM 12/11/2019    1:34 PM 08/19/2019    8:14 AM  Advanced Directives  Does Patient Have a Medical Advance Directive? Yes Yes No Yes No No Yes  Type of Estate agent of [place];Living will Healthcare Power of [place];Living will  Healthcare Power of [place];Living will   Living will;Healthcare Power of Attorney  Does patient want to make changes to medical advance directive?       No - Patient declined  Copy of Healthcare Power of Attorney in Chart? No - copy requested No - copy requested  No - copy requested   No - copy requested  Would patient like information on creating a medical advance directive?   Yes (MAU/Ambulatory/Procedural Areas - Information given)  No - Patient declined      Current Medications (verified) Outpatient Encounter Medications as of 01/05/2024  Medication Sig   acyclovir (ZOVIRAX) 800 MG tablet Take 1 tablet (800 mg total) by mouth 3 (three) times daily as needed. For outbreaks   Ascorbic Acid (VITAMIN C PO) Take 1 tablet by mouth daily.   aspirin 81 MG chewable tablet Chew by mouth daily.   Cholecalciferol (VITAMIN D3 PO) Take by mouth.   Coenzyme Q10 (CO Q 10 PO) Take by mouth daily.   folic acid (FOLVITE) 1 MG tablet Take 2 tablets (2 mg total) by mouth daily.   methotrexate (RHEUMATREX) 2.5 MG tablet TAKE 8 TABLETS (20 MG TOTAL) BY MOUTH ONCE A WEEK. CAUTION:CHEMOTHERAPY. PROTECT FROM LIGHT.    Multiple Vitamin (MULTIVITAMIN) tablet Take 1 tablet by mouth daily.   Probiotic Product (PROBIOTIC PO) Take by mouth daily.   verapamil (CALAN-SR) 180 MG CR tablet TAKE 1 TABLET BY MOUTH EVERY DAY   No facility-administered encounter medications on file as of 01/05/2024.    Allergies (verified) Vicodin [hydrocodone-acetaminophen]   History: Past Medical History:  Diagnosis Date   Abdominal hernia 11/28/2015   Allergic rhinitis    Anxiety    BPV (benign positional vertigo), right 05/11/2018   Cardiac arrhythmia    ED (erectile dysfunction)    Herpes    History of syncope    in the setting of anxiety per cardiology note   Hypertension    Kidney tumor (benign), right    sx 02/27/2015   Meningioma (HCC)    had surgery    Paroxysmal atrial fibrillation (HCC) 07/15/2012   a) CHADSVASc score 1b) On full-dose ASA and Verapamil   RA (rheumatoid arthritis) (HCC)    Seizures (HCC) 05/14/2011   "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    Vasovagal syncope    Past Surgical History:  Procedure Laterality Date   BRAIN MENINGIOMA EXCISION  2012   CATARACT EXTRACTION W/ INTRAOCULAR LENS IMPLANT  ~ 2001   right   COLONOSCOPY  2008   EYE SURGERY  1964  right; "hit w/baseball; eye hemorrhaged"   FLEXIBLE SIGMOIDOSCOPY     REFRACTIVE SURGERY  ~ 2003   ROBOTIC ASSITED PARTIAL NEPHRECTOMY Right 02/29/2016   Procedure: XI ROBOTIC ASSITED PARTIAL NEPHRECTOMY;  Surgeon: Sebastian Ache, MD;  Location: [facility];  Service: Urology;  Laterality: Right;   NEEDS ULTRASOUND   Family History  Problem Relation Age of Onset   Hypertension Brother    Prostate cancer Brother    Prostate cancer Paternal Grandfather    Healthy Son    Depression Other    Hypertension Other    GI problems Other    Lung disease Other    Colon cancer Neg Hx    Esophageal cancer Neg Hx    Ulcerative colitis Neg Hx    Social History   Socioeconomic History   Marital status: Married    Spouse name: Not on file   Number  of children: 1   Years of education: Not on file   Highest education level: Not on file  Occupational History    Comment: [employer]   Tobacco Use   Smoking status: Never    Passive exposure: Past   Smokeless tobacco: Never  Vaping Use   Vaping status: Never Used  Substance and Sexual Activity   Alcohol use: No   Drug use: No   Sexual activity: Yes  Other Topics Concern   Not on file  Social History Narrative   Regular Exercise   Social Drivers of Health   Financial Resource Strain: Low Risk  (01/05/2024)   Overall Financial Resource Strain (CARDIA)    Difficulty of Paying Living Expenses: Not hard at all  Food Insecurity: No Food Insecurity (01/05/2024)   Hunger Vital Sign    Worried About Running Out of Food in the Last Year: Never true    Ran Out of Food in the Last Year: Never true  Transportation Needs: No Transportation Needs (01/05/2024)   PRAPARE - Administrator, Civil Service (Medical): No    Lack of Transportation (Non-Medical): No  Physical Activity: Sufficiently Active (01/05/2024)   Exercise Vital Sign    Days of Exercise per Week: 5 days    Minutes of Exercise per Session: 120 min  Stress: No Stress Concern Present (01/05/2024)   [employer] of Occupational Health - Occupational Stress Questionnaire    Feeling of Stress : Not at all  Social Connections: Moderately Isolated (01/05/2024)   Social Connection and Isolation Panel [NHANES]    Frequency of Communication with Friends and Family: Once a week    Frequency of Social Gatherings with Friends and Family: More than three times a week    Attends Religious Services: Never    Database administrator or Organizations: No    Attends Engineer, structural: Never    Marital Status: Married    Tobacco Counseling Counseling given: Not Answered   Clinical Intake:  Pre-visit preparation completed: Yes  Pain : No/denies pain     BMI - recorded: 28.7 Nutritional Status: BMI  25 -29 Overweight Nutritional Risks: None Diabetes: No  How often do you need to have someone help you when you read instructions, pamphlets, or other written materials from your doctor or pharmacy?: 1 - Never  Interpreter Needed?: No  Information entered by :: Lanier Ensign, LPN   Activities of Daily Living    01/05/2024    8:00 AM  In your present state of health, do you have any difficulty performing the following activities:  Hearing? 0  Vision? 0  Difficulty concentrating or making decisions? 0  Walking or climbing stairs? 0  Dressing or bathing? 0  Doing errands, shopping? 0  Preparing Food and eating ? N  Using the Toilet? N  In the past six months, have you accidently leaked urine? N  Do you have problems with loss of bowel control? N  Managing your Medications? N  Managing your Finances? N  Housekeeping or managing your Housekeeping? N    Patient Care Team: Ardith Dark, MD as PCP - General (Family Medicine) Hilda Lias, MD as Consulting Physician (Neurosurgery) Chilton Greathouse, MD as Consulting Physician (Pulmonary Disease) Dimitri Ped, MD as Consulting Physician (Ophthalmology)  Indicate any recent Medical Services you may have received from other than [facility] providers in the past year (date may be approximate).     Assessment:   This is a routine wellness examination for Orin.  Hearing/Vision screen Hearing Screening - Comments:: Pt denies any hearing issues  Vision Screening - Comments:: Pt follows up with [facility] ophthalmology for annual eye exams    Goals Addressed             This Visit's Progress    Patient Stated       Start walking more        Depression Screen    01/05/2024    8:03 AM 12/18/2022    8:03 AM 12/09/2022    9:10 AM 11/25/2022    7:25 AM 11/18/2021    8:10 AM 11/12/2020    8:17 AM 07/06/2020    8:17 AM  PHQ 2/9 Scores  PHQ - 2 Score 0 0 0 0 0 0 0  PHQ- 9 Score       0    Fall Risk    01/05/2024     8:05 AM 12/18/2022    8:03 AM 12/09/2022    9:12 AM 11/25/2022    7:25 AM 11/24/2022    8:07 PM  Fall Risk   Falls in the past year? 0 0 0 0 0  Number falls in past yr: 0 0 0 0   Injury with Fall? 0 0 0 0 0  Risk for fall due to : No Fall Risks No Fall Risks Impaired vision No Fall Risks   Follow up Falls prevention discussed  Falls prevention discussed      MEDICARE RISK AT HOME: Medicare Risk at Home Any stairs in or around the home?: Yes If so, are there any without handrails?: No Home free of loose throw rugs in walkways, pet beds, electrical cords, etc?: Yes Adequate lighting in your home to reduce risk of falls?: Yes Life alert?: No Use of a cane, walker or w/c?: No Grab bars in the bathroom?: Yes Shower chair or bench in shower?: Yes Elevated toilet seat or a handicapped toilet?: Yes  TIMED UP AND GO:  Was the test performed?  Yes  Length of time to ambulate 10 feet: 10 sec Gait steady and fast without use of assistive device    Cognitive Function:        01/05/2024    8:06 AM 12/09/2022    9:13 AM 11/18/2021    8:13 AM 11/12/2020    8:21 AM 08/19/2019    8:13 AM  6CIT Screen  What Year? 0 points 0 points 0 points 0 points 0 points  What month? 0 points 0 points 0 points 0 points 0 points  What time? 0 points 0 points 0 points  0 points  Count back from 20 0 points 0 points 0 points 0 points 0 points  Months in reverse 0 points 0 points 0 points 0 points 0 points  Repeat phrase 0 points 0 points 0 points 0 points 0 points  Total Score 0 points 0 points 0 points  0 points    Immunizations Immunization History  Administered Date(s) Administered   Fluad Quad(high Dose 65+) 11/03/2020, 11/25/2022   Influenza Split 12/18/2011   Influenza Whole 10/18/2009, 10/22/2010   Influenza, High Dose Seasonal PF 11/28/2015   Influenza, Seasonal, Injecte, Preservative Fre 12/20/2012   Influenza,inj,Quad PF,6+ Mos 12/21/2013, 02/08/2015   Influenza-Unspecified  10/10/2018, 11/01/2021   PFIZER Comirnaty(Gray Top)Covid-19 Tri-Sucrose Vaccine 06/30/2021   PFIZER(Purple Top)SARS-COV-2 Vaccination 02/05/2020, 02/28/2020, 11/03/2020   Pneumococcal Conjugate-13 03/30/2017   Pneumococcal Polysaccharide-23 03/01/2016   Td 12/15/1998, 10/18/2009      Flu Vaccine status: Due, Education has been provided regarding the importance of this vaccine. Advised may receive this vaccine at local pharmacy or Health Dept. Aware to provide a copy of the vaccination record if obtained from local pharmacy or Health Dept. Verbalized acceptance and understanding.  Pneumococcal vaccine status: Up to date  Covid-19 vaccine status: Declined, Education has been provided regarding the importance of this vaccine but patient still declined. Advised may receive this vaccine at local pharmacy or Health Dept.or vaccine clinic. Aware to provide a copy of the vaccination record if obtained from local pharmacy or Health Dept. Verbalized acceptance and understanding.  Qualifies for Shingles Vaccine? No  Declined   Screening Tests Health Maintenance  Topic Date Due   INFLUENZA VACCINE  03/14/2024 (Originally 07/16/2023)   Medicare Annual Wellness (AWV)  01/04/2025   Colonoscopy  04/16/2028   Pneumonia Vaccine 37+ Years old  Completed   Hepatitis C Screening  Completed   HPV VACCINES  Aged Out   DTaP/Tdap/Td  Discontinued   COVID-19 Vaccine  Discontinued   Zoster Vaccines- Shingrix  Discontinued    Health Maintenance  There are no preventive care reminders to display for this patient.   Colorectal cancer screening: Type of screening: Colonoscopy. Completed 04/16/18. Repeat every 10 years   Additional Screening:  Hepatitis C Screening:  Completed 05/06/21  Vision Screening: Recommended annual ophthalmology exams for early detection of glaucoma and other disorders of the eye. Is the patient up to date with their annual eye exam?  Yes  Who is the provider or what is the name of  the office in which the patient attends annual eye exams? [facility] ophthalmology  If pt is not established with a provider, would they like to be referred to a provider to establish care? No .   Dental Screening: Recommended annual dental exams for proper oral hygiene   Community Resource Referral / Chronic Care Management: CRR required this visit?  No   CCM required this visit?  No     Plan:     I have personally reviewed and noted the following in the patient's chart:   Medical and social history Use of alcohol, tobacco or illicit drugs  Current medications and supplements including opioid prescriptions. Patient is not currently taking opioid prescriptions. Functional ability and status Nutritional status Physical activity Advanced directives List of other physicians Hospitalizations, surgeries, and ER visits in previous 12 months Vitals Screenings to include cognitive, depression, and falls Referrals and appointments  In addition, I have reviewed and discussed with patient certain preventive protocols, quality metrics, and best practice recommendations. A written personalized care plan for  preventive services as well as general preventive health recommendations were provided to patient.     Marzella Schlein, LPN   [id number]   After Visit Summary: (MyChart) Due to this being a telephonic visit, the after visit summary with patients personalized plan was offered to patient via MyChart   Nurse Notes: none

## 2024-01-05 NOTE — Patient Instructions (Addendum)
Mr. Max [name] , Thank you for taking time to come for your Medicare Wellness Visit. I appreciate your ongoing commitment to your health goals. Please review the following plan we discussed and let me know if I can assist you in the future.   Referrals/Orders/Follow-Ups/Clinician Recommendations: continue to increase walking  Aim for 30 minutes of exercise or brisk walking, 6-8 glasses of water, and 5 servings of fruits and vegetables each day.   This is a list of the screening recommended for you and due dates:  Health Maintenance  Topic Date Due   Flu Shot  07/16/2023   Medicare Annual Wellness Visit  12/10/2023   Colon Cancer Screening  04/16/2028   Pneumonia Vaccine  Completed   Hepatitis C Screening  Completed   HPV Vaccine  Aged Out   DTaP/Tdap/Td vaccine  Discontinued   COVID-19 Vaccine  Discontinued   Zoster (Shingles) Vaccine  Discontinued    Advanced directives: (Copy Requested) Please bring a copy of your health care power of attorney and living will to the office to be added to your chart at your convenience.  Next Medicare Annual Wellness Visit scheduled for next year: Yes

## 2024-01-22 ENCOUNTER — Other Ambulatory Visit: Payer: Self-pay | Admitting: Rheumatology

## 2024-01-22 DIAGNOSIS — E785 Hyperlipidemia, unspecified: Secondary | ICD-10-CM

## 2024-01-22 NOTE — Telephone Encounter (Signed)
 Last Fill: 11/05/2023  Labs: 10/16/2023 CMP WNL MCV and MCH remain elevated- continue folic acid  2 mg daily. Rest of CBC WNL.  We will continue to monitor  Next Visit: 03/15/2024  Last Visit: 10/16/2023  DX: Rheumatoid arthritis with rheumatoid factor of multiple sites without organ or systems involvement   Current Dose per office note 10/16/2023: Methotrexate  8 tablets by mouth once weekly   Patient advised he is due to update labs. Patient will come Monday to update.   Okay to refill Methotrexate ?

## 2024-01-25 ENCOUNTER — Other Ambulatory Visit: Payer: Self-pay | Admitting: *Deleted

## 2024-01-25 DIAGNOSIS — Z79899 Other long term (current) drug therapy: Secondary | ICD-10-CM | POA: Diagnosis not present

## 2024-01-25 DIAGNOSIS — E785 Hyperlipidemia, unspecified: Secondary | ICD-10-CM | POA: Diagnosis not present

## 2024-01-26 LAB — COMPLETE METABOLIC PANEL WITH GFR
AG Ratio: 1.7 (calc) (ref 1.0–2.5)
ALT: 15 U/L (ref 9–46)
AST: 15 U/L (ref 10–35)
Albumin: 4.3 g/dL (ref 3.6–5.1)
Alkaline phosphatase (APISO): 75 U/L (ref 35–144)
BUN: 19 mg/dL (ref 7–25)
CO2: 29 mmol/L (ref 20–32)
Calcium: 9.5 mg/dL (ref 8.6–10.3)
Chloride: 106 mmol/L (ref 98–110)
Creat: 1.15 mg/dL (ref 0.70–1.28)
Globulin: 2.5 g/dL (ref 1.9–3.7)
Glucose, Bld: 94 mg/dL (ref 65–99)
Potassium: 4.8 mmol/L (ref 3.5–5.3)
Sodium: 141 mmol/L (ref 135–146)
Total Bilirubin: 0.5 mg/dL (ref 0.2–1.2)
Total Protein: 6.8 g/dL (ref 6.1–8.1)
eGFR: 67 mL/min/{1.73_m2} (ref >=60)

## 2024-01-26 LAB — CBC WITH DIFFERENTIAL/PLATELET
Absolute Lymphocytes: 1050 {cells}/uL (ref 850–3900)
Absolute Monocytes: 686 {cells}/uL (ref 200–950)
Basophils Absolute: 83 {cells}/uL (ref 0–200)
Basophils Relative: 1.6 %
Eosinophils Absolute: 73 {cells}/uL (ref 15–500)
Eosinophils Relative: 1.4 %
HCT: 44.4 % (ref 38.5–50.0)
Hemoglobin: 14.9 g/dL (ref 13.2–17.1)
MCH: 35.4 pg — ABNORMAL HIGH (ref 27.0–33.0)
MCHC: 33.6 g/dL (ref 32.0–36.0)
MCV: 105.5 fL — ABNORMAL HIGH (ref 80.0–100.0)
MPV: 11 fL (ref 7.5–12.5)
Monocytes Relative: 13.2 %
Neutro Abs: 3307 {cells}/uL (ref 1500–7800)
Neutrophils Relative %: 63.6 %
Platelets: 359 10*3/uL (ref 140–400)
RBC: 4.21 10*6/uL (ref 4.20–5.80)
RDW: 13.8 % (ref 11.0–15.0)
Total Lymphocyte: 20.2 %
WBC: 5.2 10*3/uL (ref 3.8–10.8)

## 2024-01-26 LAB — LIPID PANEL
Cholesterol: 168 mg/dL (ref <200)
HDL: 61 mg/dL (ref >=40)
LDL Cholesterol (Calc): 91 mg/dL
Non-HDL Cholesterol (Calc): 107 mg/dL (ref <130)
Total CHOL/HDL Ratio: 2.8 (calc) (ref <5.0)
Triglycerides: 71 mg/dL (ref <150)

## 2024-01-26 NOTE — Progress Notes (Signed)
MCV and MCH remain elevated. Please make sure the patient is taking folic acid as prescribed. Rest of CBC WNL CMP WNL Lipid panel WNL

## 2024-01-28 ENCOUNTER — Other Ambulatory Visit: Payer: Self-pay | Admitting: Family Medicine

## 2024-01-28 ENCOUNTER — Other Ambulatory Visit: Payer: Self-pay | Admitting: Physician Assistant

## 2024-02-23 ENCOUNTER — Ambulatory Visit: Payer: Medicare HMO | Admitting: Family Medicine

## 2024-02-23 ENCOUNTER — Encounter: Payer: Self-pay | Admitting: Family Medicine

## 2024-02-23 VITALS — BP 127/84 | HR 126 | Temp 97.5°F | Ht 72.0 in | Wt 210.6 lb

## 2024-02-23 DIAGNOSIS — I10 Essential (primary) hypertension: Secondary | ICD-10-CM | POA: Diagnosis not present

## 2024-02-23 DIAGNOSIS — Z8042 Family history of malignant neoplasm of prostate: Secondary | ICD-10-CM | POA: Diagnosis not present

## 2024-02-23 DIAGNOSIS — L570 Actinic keratosis: Secondary | ICD-10-CM

## 2024-02-23 DIAGNOSIS — R739 Hyperglycemia, unspecified: Secondary | ICD-10-CM

## 2024-02-23 DIAGNOSIS — M545 Low back pain, unspecified: Secondary | ICD-10-CM

## 2024-02-23 DIAGNOSIS — M06041 Rheumatoid arthritis without rheumatoid factor, right hand: Secondary | ICD-10-CM | POA: Diagnosis not present

## 2024-02-23 DIAGNOSIS — Z0001 Encounter for general adult medical examination with abnormal findings: Secondary | ICD-10-CM

## 2024-02-23 DIAGNOSIS — I480 Paroxysmal atrial fibrillation: Secondary | ICD-10-CM

## 2024-02-23 DIAGNOSIS — E785 Hyperlipidemia, unspecified: Secondary | ICD-10-CM

## 2024-02-23 LAB — PSA: PSA: 1.65 ng/mL (ref 0.10–4.00)

## 2024-02-23 LAB — HEMOGLOBIN A1C: Hgb A1c MFr Bld: 6.1 % (ref 4.6–6.5)

## 2024-02-23 LAB — TSH: TSH: 3.76 u[IU]/mL (ref 0.35–5.50)

## 2024-02-23 MED ORDER — VERAPAMIL HCL ER 180 MG PO TBCR: 180.0000 mg | EXTENDED_RELEASE_TABLET | Freq: Every day | ORAL | 3 refills | Status: DC

## 2024-02-23 NOTE — Assessment & Plan Note (Signed)
 Check PSA. ?

## 2024-02-23 NOTE — Assessment & Plan Note (Addendum)
 No red flags.  Reassuring exam today.  This has been an ongoing issue for many years.  He likely does have some degenerative changes in his lower back.  Given his overall reassuring exam and resolution of symptoms do not think we need to do any further workup for this at this point.  We did discuss referral to PT or sports medicine however he declined.  We discussed home exercises and handout was given.  He will let us know if he has any flare up or if his symptoms change.

## 2024-02-23 NOTE — Progress Notes (Signed)
 Chief Complaint:  Max [name] is a 73 y.o. male who presents today for his annual comprehensive physical exam.    Assessment/Plan:  Chronic Problems Addressed Today: Actinic keratosis Cryotherapy performed today.  See below procedure note.  He tolerated well.  Family history of prostate cancer Check PSA.  Paroxysmal atrial fibrillation (HCC) Rate controlled on verapamil.  On aspirin 81 mg daily.Declines anticoagulation.  Rheumatoid arthritis (HCC) Continue management per rheumatology.  He is on methotrexate.  Overall symptoms are stable.  Essential hypertension Blood pressure at goal on verapamil 180 mg daily.  Recurrent low back pain No red flags.  Reassuring exam today.  This has been an ongoing issue for many years.  He likely does have some degenerative changes in his lower back.  Given his overall reassuring exam and resolution of symptoms do not think we need to do any further workup for this at this point.  We did discuss referral to PT or sports medicine however he declined.  We discussed home exercises and handout was given.  He will let us know if he has any flare up or if his symptoms change.   Dyslipidemia Recent lipids at goal.  Hyperglycemia Check A1c.  Discussed lifestyle modifications.   Preventative Healthcare: Check labs.  Up-to-date on colon cancer screening.  Declined flu vaccine.  Patient Counseling(The following topics were reviewed and/or handout was given):  -Nutrition: Stressed importance of moderation in sodium/caffeine intake, saturated fat and cholesterol, caloric balance, sufficient intake of fresh fruits, vegetables, and fiber.  -Stressed the importance of regular exercise.   -Substance Abuse: Discussed cessation/primary prevention of tobacco, alcohol, or other drug use; driving or other dangerous activities under the influence; availability of treatment for abuse.   -Injury prevention: Discussed safety belts, safety helmets, smoke detector,  smoking near bedding or upholstery.   -Sexuality: Discussed sexually transmitted diseases, partner selection, use of condoms, avoidance of unintended pregnancy and contraceptive alternatives.   -Dental health: Discussed importance of regular tooth brushing, flossing, and dental visits.  -Health maintenance and immunizations reviewed. Please refer to Health maintenance section.  Return to care in 1 year for next preventative visit.     Subjective:  HPI:  He has no acute complaints today. See Assessment / plan for status of chronic conditions. He has been having more intermittent low back pain. This has been coming on going for several years. He did have a flare up several days ago but symptoms have improved significantly. Not currently having any symptoms.   Lifestyle Diet: None specific.  Exercise: Trying to walk more.      01/05/2024    8:03 AM  Depression screen PHQ 2/9  Decreased Interest 0  Down, Depressed, Hopeless 0  PHQ - 2 Score 0    There are no preventive care reminders to display for this patient.   ROS: Per HPI, otherwise a complete review of systems was negative.   PMH:  The following were reviewed and entered/updated in epic: Past Medical History:  Diagnosis Date   Abdominal hernia 11/28/2015   Allergic rhinitis    Anxiety    BPV (benign positional vertigo), right 05/11/2018   Cardiac arrhythmia    ED (erectile dysfunction)    Herpes    History of syncope    in the setting of anxiety per cardiology note   Hypertension    Kidney tumor (benign), right    sx 02/27/2015   Meningioma [facility])    had surgery    Paroxysmal atrial fibrillation (HCC)  07/15/2012   a) CHADSVASc score 1b) On full-dose ASA and Verapamil   RA (rheumatoid arthritis) (HCC)    Seizures (HCC) 05/14/2011   "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    Vasovagal syncope    Patient Active Problem List   Diagnosis Date Noted   Family history of prostate cancer 11/25/2022   Actinic keratosis  07/06/2020   Dyslipidemia 06/21/2019   Hyperglycemia 06/21/2019   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   Pulmonary nodule 01/28/2019   History of cataract 01/28/2019   Recurrent low back pain 11/28/2015   Paroxysmal atrial fibrillation (HCC) 02/04/2014   History of cold sores 07/14/2012   Rheumatoid arthritis (HCC) 11/23/2009   ERECTILE DYSFUNCTION, 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   Essential hypertension 08/26/2007   Past Surgical History:  Procedure Laterality Date   BRAIN MENINGIOMA EXCISION  2012   CATARACT EXTRACTION W/ INTRAOCULAR LENS IMPLANT  ~ 2001   right   COLONOSCOPY  2008   EYE SURGERY  1964   right; "hit w/baseball; eye hemorrhaged"   FLEXIBLE SIGMOIDOSCOPY     REFRACTIVE SURGERY  ~ 2003   ROBOTIC ASSITED PARTIAL NEPHRECTOMY Right 02/29/2016   Procedure: XI ROBOTIC ASSITED PARTIAL NEPHRECTOMY;  Surgeon: Sebastian Ache, MD;  Location: [facility];  Service: Urology;  Laterality: Right;   NEEDS ULTRASOUND    Family History  Problem Relation Age of Onset   Hypertension Brother    Prostate cancer Brother    Prostate cancer Paternal Grandfather    Healthy Son    Depression Other    Hypertension Other    GI problems Other    Lung disease Other    Colon cancer Neg Hx    Esophageal cancer Neg Hx    Ulcerative colitis Neg Hx     Medications- reviewed and updated Current Outpatient Medications  Medication Sig Dispense Refill   acyclovir (ZOVIRAX) 800 MG tablet Take 1 tablet (800 mg total) by mouth 3 (three) times daily as needed. For outbreaks 60 tablet 1   Ascorbic Acid (VITAMIN C PO) Take 1 tablet by mouth daily.     aspirin 81 MG chewable tablet Chew by mouth daily.     Cholecalciferol (VITAMIN D3 PO) Take by mouth.     Coenzyme Q10 (CO Q 10 PO) Take by mouth daily.     folic acid (FOLVITE) 1 MG tablet Take 2 tablets (2 mg total) by mouth daily. 180 tablet 3   methotrexate (RHEUMATREX) 2.5 MG tablet TAKE 8 TABLETS (20 MG TOTAL) BY MOUTH ONCE A WEEK. CAUTION:CHEMOTHERAPY. PROTECT  FROM LIGHT. 96 tablet 0   Multiple Vitamin (MULTIVITAMIN) tablet Take 1 tablet by mouth daily.     Probiotic Product (PROBIOTIC PO) Take by mouth daily.     verapamil (CALAN-SR) 180 MG CR tablet Take 1 tablet (180 mg total) by mouth daily. 90 tablet 3   No current facility-administered medications for this visit.    Allergies-reviewed and updated Allergies  Allergen Reactions   Vicodin [Hydrocodone-Acetaminophen] Nausea And Vomiting    Social History   Socioeconomic History   Marital status: Married    Spouse name: Not on file   Number of children: 1   Years of education: Not on file   Highest education level: Not on file  Occupational History    Comment: [employer]   Tobacco Use   Smoking status: Never    Passive exposure: Past   Smokeless tobacco: Never  Vaping Use   Vaping status: Never Used  Substance and Sexual Activity  Alcohol use: No   Drug use: No   Sexual activity: Yes  Other Topics Concern   Not on file  Social History Narrative   Regular Exercise   Social Drivers of Health   Financial Resource Strain: Low Risk  (01/05/2024)   Overall Financial Resource Strain (CARDIA)    Difficulty of Paying Living Expenses: Not hard at all  Food Insecurity: No Food Insecurity (01/05/2024)   Hunger Vital Sign    Worried About Running Out of Food in the Last Year: Never true    Ran Out of Food in the Last Year: Never true  Transportation Needs: No Transportation Needs (01/05/2024)   PRAPARE - Administrator, Civil Service (Medical): No    Lack of Transportation (Non-Medical): No  Physical Activity: Sufficiently Active (01/05/2024)   Exercise Vital Sign    Days of Exercise per Week: 5 days    Minutes of Exercise per Session: 120 min  Stress: No Stress Concern Present (01/05/2024)   [employer] of Occupational Health - Occupational Stress Questionnaire    Feeling of Stress : Not at all  Social Connections: Moderately Isolated (01/05/2024)    Social Connection and Isolation Panel [NHANES]    Frequency of Communication with Friends and Family: Once a week    Frequency of Social Gatherings with Friends and Family: More than three times a week    Attends Religious Services: Never    Database administrator or Organizations: No    Attends Engineer, structural: Never    Marital Status: Married        Objective:  Physical Exam: BP 127/84   Pulse (!) 126   Temp (!) 97.5 F (36.4 C)   Ht 6' (1.829 m)   Wt 210 lb 9.6 oz (95.5 kg)   SpO2 95%   BMI 28.56 kg/m   Body mass index is 28.56 kg/m. Wt Readings from Last 3 Encounters:  02/23/24 210 lb 9.6 oz (95.5 kg)  01/05/24 211 lb 9.6 oz (96 kg)  10/16/23 204 lb (92.5 kg)   Gen: NAD, resting comfortably HEENT: TMs normal bilaterally. OP clear. No thyromegaly noted.  CV: Irregular with no murmurs appreciated Pulm: NWOB, CTAB with no crackles, wheezes, or rhonchi GI: Normal bowel sounds present. Soft, Nontender, Nondistended. MSK: no edema, cyanosis, or clubbing noted Skin: warm, dry.  Several scattered actinic keratoses on forehead. Neuro: CN2-12 grossly intact. Strength 5/5 in upper and lower extremities. Reflexes symmetric and intact bilaterally.  Psych: Normal affect and thought content  Cryotherapy Procedure Note  Pre-operative Diagnosis: Actinic keratosis  Locations: Forehead, scalp  Indications: Therapeutic  Procedure Details  Patient informed of risks (permanent scarring, infection, light or dark discoloration, bleeding, infection, weakness, numbness and recurrence of the lesion) and benefits of the procedure and verbal informed consent obtained.  The areas are treated with liquid nitrogen therapy, frozen until ice ball extended 3 mm beyond lesion, allowed to thaw, and treated again. The patient tolerated procedure well.  A total of 6 lesions were treated.  The patient was instructed on post-op care, warned that there may be blister formation, redness and  pain. Recommend OTC analgesia as needed for pain.  Condition: Stable  Complications: none.      Katina Degree. Jimmey Ralph, MD 02/23/2024 9:12 AM

## 2024-02-23 NOTE — Assessment & Plan Note (Signed)
 Cryotherapy performed today.  See below procedure note.  He tolerated well.

## 2024-02-23 NOTE — Assessment & Plan Note (Signed)
 Blood pressure at goal on verapamil 180 mg daily.

## 2024-02-23 NOTE — Assessment & Plan Note (Signed)
Recent lipids at goal 

## 2024-02-23 NOTE — Patient Instructions (Addendum)
 It was very nice to see you today!  We froze the spots on your face today.  Please work on the exercises for your back.  Please continue to work on diet and exercise.  Return in about 1 year (around 02/22/2025) for Annual Physical.   Take care, Dr Jimmey Ralph  PLEASE NOTE:  If you had any lab tests, please let us know if you have not heard back within a few days. You may see your results on mychart before we have a chance to review them but we will give you a call once they are reviewed by [place].   If we ordered any referrals today, please let us know if you have not heard from their office within the next week.   If you had any urgent prescriptions sent in today, please check with the pharmacy within an hour of our visit to make sure the prescription was transmitted appropriately.   Please try these tips to maintain a healthy lifestyle:  Eat at least 3 REAL meals and 1-2 snacks per day.  Aim for no more than 5 hours between eating.  If you eat breakfast, please do so within one hour of getting up.   Each meal should contain half fruits/vegetables, one quarter protein, and one quarter carbs (no bigger than a computer mouse)  Cut down on sweet beverages. This includes juice, soda, and sweet tea.   Drink at least 1 glass of water with each meal and aim for at least 8 glasses per day  Exercise at least 150 minutes every week.    Preventive Care 72 Years and Older, Male Preventive care refers to lifestyle choices and visits with your health care provider that can promote health and wellness. Preventive care visits are also called wellness exams. What can I expect for my preventive care visit? Counseling During your preventive care visit, your health care provider may ask about your: Medical history, including: Past medical problems. Family medical history. History of falls. Current health, including: Emotional well-being. Home life and relationship well-being. Sexual activity. Memory and  ability to understand (cognition). Lifestyle, including: Alcohol, nicotine or tobacco, and drug use. Access to firearms. Diet, exercise, and sleep habits. Work and work Astronomer. Sunscreen use. Safety issues such as seatbelt and bike helmet use. Physical exam Your health care provider will check your: Height and weight. These may be used to calculate your BMI (body mass index). BMI is a measurement that tells if you are at a healthy weight. Waist circumference. This measures the distance around your waistline. This measurement also tells if you are at a healthy weight and may help predict your risk of certain diseases, such as type 2 diabetes and high blood pressure. Heart rate and blood pressure. Body temperature. Skin for abnormal spots. What immunizations do I need?  Vaccines are usually given at various ages, according to a schedule. Your health care provider will recommend vaccines for you based on your age, medical history, and lifestyle or other factors, such as travel or where you work. What tests do I need? Screening Your health care provider may recommend screening tests for certain conditions. This may include: Lipid and cholesterol levels. Diabetes screening. This is done by checking your blood sugar (glucose) after you have not eaten for a while (fasting). Hepatitis C test. Hepatitis B test. HIV (human immunodeficiency virus) test. STI (sexually transmitted infection) testing, if you are at risk. Lung cancer screening. Colorectal cancer screening. Prostate cancer screening. Abdominal aortic aneurysm (AAA) screening. You  may need this if you are a current or former smoker. Talk with your health care provider about your test results, treatment options, and if necessary, the need for more tests. Follow these instructions at home: Eating and drinking  Eat a diet that includes fresh fruits and vegetables, whole grains, lean protein, and low-fat dairy products. Limit your  intake of foods with high amounts of sugar, saturated fats, and salt. Take vitamin and mineral supplements as recommended by your health care provider. Do not drink alcohol if your health care provider tells you not to drink. If you drink alcohol: Limit how much you have to 0-2 drinks a day. Know how much alcohol is in your drink. In the [place] one drink equals one 12 oz bottle of beer (355 mL), one 5 oz glass of wine (148 mL), or one 1 oz glass of hard liquor (44 mL). Lifestyle Brush your teeth every morning and night with fluoride toothpaste. Floss one time each day. Exercise for at least 30 minutes 5 or more days each week. Do not use any products that contain nicotine or tobacco. These products include cigarettes, chewing tobacco, and vaping devices, such as e-cigarettes. If you need help quitting, ask your health care provider. Do not use drugs. If you are sexually active, practice safe sex. Use a condom or other form of protection to prevent STIs. Take aspirin only as told by your health care provider. Make sure that you understand how much to take and what form to take. Work with your health care provider to find out whether it is safe and beneficial for you to take aspirin daily. Ask your health care provider if you need to take a cholesterol-lowering medicine (statin). Find healthy ways to manage stress, such as: Meditation, yoga, or listening to music. Journaling. Talking to a trusted person. Spending time with friends and family. Safety Always wear your seat belt while driving or riding in a vehicle. Do not drive: If you have been drinking alcohol. Do not ride with someone who has been drinking. When you are tired or distracted. While texting. If you have been using any mind-altering substances or drugs. Wear a helmet and other protective equipment during sports activities. If you have firearms in your house, make sure you follow all gun safety procedures. Minimize exposure to  UV radiation to reduce your risk of skin cancer. What's next? Visit your health care provider once a year for an annual wellness visit. Ask your health care provider how often you should have your eyes and teeth checked. Stay up to date on all vaccines. This information is not intended to replace advice given to you by your health care provider. Make sure you discuss any questions you have with your health care provider. Document Revised: 05/29/2021 Document Reviewed: 05/29/2021 Elsevier Patient Education  2024 [employer].

## 2024-02-23 NOTE — Assessment & Plan Note (Signed)
 Continue management per rheumatology.  He is on methotrexate.  Overall symptoms are stable.

## 2024-02-23 NOTE — Assessment & Plan Note (Signed)
 Rate controlled on verapamil.  On aspirin 81 mg daily.Declines anticoagulation.

## 2024-02-23 NOTE — Assessment & Plan Note (Signed)
 Check A1c.  Discussed lifestyle modifications.

## 2024-02-24 ENCOUNTER — Encounter: Payer: Self-pay | Admitting: Family Medicine

## 2024-02-24 NOTE — Progress Notes (Signed)
 His A1c is borderline elevated but stable to last few values.  Do not need to start meds.  He should work on diet and exercise and we can recheck again in a year.  His thyroid and PSA levels are at goal.  We can recheck this again in a year.

## 2024-03-01 NOTE — Progress Notes (Unsigned)
 Office Visit Note  Patient: Max [name]             Date of Birth: 09/14/1951           MRN: [id number]             PCP: Ardith Dark, MD Referring: Ardith Dark, MD Visit Date: 03/15/2024 Occupation: @GUAROCC @  Subjective:  Shortness of breath   History of Present Illness: Max [name] is a 73 y.o. male with history of seropositive rheumatoid arthritis.  He is taking Methotrexate 8 tablets by mouth once weekly and folic acid 2 mg daily.  He is tolerating methotrexate without any side effects and has not had any recent gaps in therapy.  He denies any signs or symptoms of a rheumatoid arthritis flare.  He has not been experiencing any increased morning stiffness, nocturnal pain, or difficulty with ADLs.  He denies any joint swelling at this time.  He denies any recent or recurrent infections.  Patient states for the past 1 week he has been experiencing increased shortness of breath as well as muscle fatigue.  He denies any cough or any other infectious symptoms.  Patient states that the shortness of breath is worse with exertion is also experienced shortness of breath at rest.  Patient states last night he had difficulty sleeping especially while lying flat.  He tried sleeping in recliner but continued to have interrupted sleep needing to take a deep breath.   Activities of Daily Living:  Patient reports morning stiffness for 0  none .   Patient Denies nocturnal pain.  Difficulty dressing/grooming: Denies Difficulty climbing stairs: Denies Difficulty getting out of chair: Denies Difficulty using hands for taps, buttons, cutlery, and/or writing: Denies  Review of Systems  Constitutional:  Positive for fatigue.  HENT:  Negative for mouth sores and mouth dryness.   Eyes:  Negative for dryness.  Respiratory:  Positive for shortness of breath.   Cardiovascular:  Negative for chest pain and palpitations.  Gastrointestinal:  Positive for constipation. Negative for blood in  stool and diarrhea.  Endocrine: Negative for increased urination.  Genitourinary:  Negative for involuntary urination.  Musculoskeletal:  Negative for joint pain, gait problem, joint pain, joint swelling, myalgias, muscle weakness, morning stiffness, muscle tenderness and myalgias.  Skin:  Negative for color change, rash, hair loss and sensitivity to sunlight.  Allergic/Immunologic: Negative for susceptible to infections.  Neurological:  Negative for dizziness and headaches.  Hematological:  Negative for swollen glands.  Psychiatric/Behavioral:  Positive for sleep disturbance. Negative for depressed mood. The patient is not nervous/anxious.     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   Essential hypertension 08/26/2007    Past Medical History:  Diagnosis Date   Abdominal hernia 11/28/2015   Allergic rhinitis    Anxiety    BPV (benign positional vertigo), right 05/11/2018   Cardiac arrhythmia    ED (erectile dysfunction)    Herpes    History of syncope    in the setting of anxiety per cardiology note  Hypertension    Kidney tumor (benign), right    sx 02/27/2015   Meningioma [facility])    had surgery    Paroxysmal atrial fibrillation (HCC) 07/15/2012   a) CHADSVASc score 1b) On full-dose ASA and Verapamil   RA (rheumatoid arthritis) (HCC)    Seizures (HCC) 05/14/2011   "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    Vasovagal syncope     Family History  Problem Relation Age of Onset   Hypertension Brother    Prostate  cancer Brother    Prostate cancer Paternal Grandfather    Healthy Son    Depression Other    Hypertension Other    GI problems Other    Lung disease Other    Colon cancer Neg Hx    Esophageal cancer Neg Hx    Ulcerative colitis Neg Hx    Past Surgical History:  Procedure Laterality Date   BRAIN MENINGIOMA EXCISION  2012   CATARACT EXTRACTION W/ INTRAOCULAR LENS IMPLANT  ~ 2001   right   COLONOSCOPY  2008   EYE SURGERY  1964   right; "hit w/baseball; eye hemorrhaged"   FLEXIBLE SIGMOIDOSCOPY     REFRACTIVE SURGERY  ~ 2003   ROBOTIC ASSITED PARTIAL NEPHRECTOMY Right 02/29/2016   Procedure: XI ROBOTIC ASSITED PARTIAL NEPHRECTOMY;  Surgeon: Sebastian Ache, MD;  Location: [facility];  Service: Urology;  Laterality: Right;   NEEDS ULTRASOUND   Social History   Social History Narrative   Regular Exercise   Immunization History  Administered Date(s) Administered   Fluad Quad(high Dose 65+) 11/03/2020, 11/25/2022   Influenza Split 12/18/2011   Influenza Whole 10/18/2009, 10/22/2010   Influenza, High Dose Seasonal PF 11/28/2015   Influenza, Seasonal, Injecte, Preservative Fre 12/20/2012   Influenza,inj,Quad PF,6+ Mos 12/21/2013, 02/08/2015   Influenza-Unspecified 10/10/2018, 11/01/2021   PFIZER Comirnaty(Gray Top)Covid-19 Tri-Sucrose Vaccine 06/30/2021   PFIZER(Purple Top)SARS-COV-2 Vaccination 02/05/2020, 02/28/2020, 11/03/2020   Pneumococcal Conjugate-13 03/30/2017   Pneumococcal Polysaccharide-23 03/01/2016   Td 12/15/1998, 10/18/2009     Objective: Vital Signs: BP 136/78 (BP Location: Left Arm, Patient Position: Sitting, Cuff Size: Normal)   Pulse 73   Resp 16   Ht 6' (1.829 m)   Wt 210 lb (95.3 kg)   SpO2 95%   BMI 28.48 kg/m    Physical Exam Vitals and nursing note reviewed.  Constitutional:      Appearance: He is well-developed.  HENT:     Head: Normocephalic and atraumatic.  Eyes:     Conjunctiva/sclera: Conjunctivae normal.     Pupils: Pupils are equal, round,  and reactive to light.  Cardiovascular:     Rate and Rhythm: Tachycardia present. Rhythm irregular.     Heart sounds: Normal heart sounds.  Pulmonary:     Breath sounds: Normal breath sounds. No wheezing.     Comments: Pulse ox 95% saturation.  Breathlessness noted while laying supine.  Abdominal:     General: Bowel sounds are normal.     Palpations: Abdomen is soft.  Musculoskeletal:     Cervical back: Normal range of motion and neck supple.     Right lower leg: Edema present.     Left lower leg: Edema present.  Skin:    General: Skin is warm and dry.     Capillary Refill: Capillary refill takes less than 2 seconds.  Neurological:     Mental Status: He is alert and oriented to person, place, and time.  Psychiatric:        Behavior: Behavior normal.  Musculoskeletal Exam: C-spine, thoracic spine, and lumbar spine good ROM. Shoulder joints, elbow joints, wrist joints, MCPs, PIPs, and DIPs good ROM with no synovitis.  Complete fist formation bilaterally.  PIP and DIP thickening consistent with osteoarthritic changes.  Hip joints have good range of motion with no groin pain.  Knee joints have good range of motion with no warmth or effusion.  Ankle joints have good range of motion with no joint tenderness.  Pedal edema noted in bilateral lower extremities.  CDAI Exam: CDAI Score: -- Patient Global: --; Provider Global: -- Swollen: --; Tender: -- Joint Exam 03/15/2024   No joint exam has been documented for this visit   There is currently no information documented on the homunculus. Go to the Rheumatology activity and complete the homunculus joint exam.  Investigation: No additional findings.  Imaging: No results found.  Recent Labs: Lab Results  Component Value Date   WBC 5.2 01/25/2024   HGB 14.9 01/25/2024   PLT 359 01/25/2024   NA 141 01/25/2024   K 4.8 01/25/2024   CL 106 01/25/2024   CO2 29 01/25/2024   GLUCOSE 94 01/25/2024   BUN 19 01/25/2024   CREATININE  1.15 01/25/2024   BILITOT 0.5 01/25/2024   ALKPHOS 74 12/18/2022   AST 15 01/25/2024   ALT 15 01/25/2024   PROT 6.8 01/25/2024   ALBUMIN 4.2 12/18/2022   CALCIUM 9.5 01/25/2024   GFRAA 76 05/06/2021   QFTBGOLDPLUS NEGATIVE 05/06/2021    Speciality Comments: Dxd 2011. MTX x11 yrs. off and on.  Procedures:  No procedures performed Allergies: Vicodin [hydrocodone-acetaminophen]    Assessment / Plan:     Visit Diagnoses: Rheumatoid arthritis with rheumatoid factor of multiple sites without organ or systems involvement (HCC) - +RF,+CCP Ab. dxd 2011.  Severe erosive changes noted on x-rays from 05/06/2021: He has no joint tenderness or synovitis on examination today.  He has not been experiencing any morning stiffness, nocturnal pain, or difficulty with ADLs.  He has not had any signs or symptoms of a rheumatoid arthritis flare.  He has clinically been doing well taking methotrexate 8 tablets by mouth once weekly and folic acid 2 mg daily.  He is tolerating combination therapy without any side effects or recurrent infections.  No medication changes will be made at this time.  He is advised to notify us if he develops signs or symptoms of a flare.  He will follow up in 5 months or sooner if needed.   Association of heart disease with rheumatoid arthritis was discussed. Need to monitor blood pressure, cholesterol, and to exercise 30-60 minutes on daily basis was discussed. Lipid panel within normal limits on 01/25/2024. Patient will be following up with Dr. Jimmey Ralph today for further evaluation of the shortness of breath and general muscle fatigue and weakness he has been experiencing x 1 week.  Patient has a known history of A-fib and is currently symptomatic.  He has been taking verapamil as prescribed.  High risk medication use: Methotrexate 8 tablets by mouth once weekly and folic acid 2 mg daily.  CBC and CMP updated on 01/25/24.  His next lab work will be due in May and every 3 months to monitor  for drug toxicity. Lipid panel updated on 01/25/24 No recent or recurrent infections. Discussed the importance of holding methotrexate if he develops signs or symptoms of an infection and to resume once the infection has completely cleared.   Rheumatoid arthritis involving both hands with positive rheumatoid factor (HCC): He  has no synovitis on examination today.  Complete fist formation noted.  He has not had any difficulty performing ADLs.  Patient remain on methotrexate as monotherapy.  He was advised to notify us if he develops any new or worsening symptoms.  Rheumatoid arthritis involving both feet with positive rheumatoid factor (HCC): He is not experiencing any discomfort in his feet at this time.   Paroxysmal atrial fibrillation (HCC) -Patient remains on verapamil as prescribed and is taking aspirin 81 mg p.o. daily.  Patient's been experiencing increased shortness of breath and generalized weakness for the past 1 week.  No identifiable trigger.  No recent infection.  No change in medication or gap in therapy.  He has noticed increased breathlessness especially with exertion but as well as at rest.  Last night he had interrupted sleep at night especially when lying supine.  He tried sleeping in a recliner but continued to have interrupted sleep due to shortness of breath. Irregular rhythm noted today.  He was breathless while laying supine. Facial erythema also noted while supine-improved when rising to a seated position.  Lungs were clear to auscultation.  Pedal edema noted in bilateral lower extremities. Patient was advised to call his PCP ASAP to seek urgent evaluation.  If he is unable to be seen by his PCP he was encouraged to go to the emergency department.  Other medical conditions are listed as follows:   Defect of right cornea  Essential hypertension: Blood pressure was 136/78 today in office.  Dyslipidemia: Lipid panel WNL on 01/25/24.   Pulmonary nodule  History of  cataract  Actinic keratosis  Orders: No orders of the defined types were placed in this encounter.  No orders of the defined types were placed in this encounter.     Follow-Up Instructions: Return in about 5 months (around 08/15/2024) for Rheumatoid arthritis.   Gearldine Bienenstock, PA-C  Note - This record has been created using Dragon software.  Chart creation errors have been sought, but may not always  have been located. Such creation errors do not reflect on  the standard of medical care.

## 2024-03-15 ENCOUNTER — Encounter: Payer: Self-pay | Admitting: Physician Assistant

## 2024-03-15 ENCOUNTER — Ambulatory Visit: Payer: Self-pay | Admitting: Family Medicine

## 2024-03-15 ENCOUNTER — Emergency Department (HOSPITAL_BASED_OUTPATIENT_CLINIC_OR_DEPARTMENT_OTHER)

## 2024-03-15 ENCOUNTER — Observation Stay (HOSPITAL_BASED_OUTPATIENT_CLINIC_OR_DEPARTMENT_OTHER)

## 2024-03-15 ENCOUNTER — Ambulatory Visit: Payer: Medicare HMO | Attending: Physician Assistant | Admitting: Physician Assistant

## 2024-03-15 ENCOUNTER — Observation Stay (HOSPITAL_BASED_OUTPATIENT_CLINIC_OR_DEPARTMENT_OTHER)
Admission: EM | Admit: 2024-03-15 | Discharge: 2024-03-17 | Disposition: A | Discharge status: Home / Self Care | Attending: Internal Medicine | Admitting: Internal Medicine

## 2024-03-15 ENCOUNTER — Telehealth (HOSPITAL_COMMUNITY): Payer: Self-pay | Admitting: Pharmacy Technician

## 2024-03-15 ENCOUNTER — Other Ambulatory Visit: Payer: Self-pay

## 2024-03-15 ENCOUNTER — Other Ambulatory Visit (HOSPITAL_COMMUNITY): Payer: Self-pay

## 2024-03-15 ENCOUNTER — Encounter (HOSPITAL_COMMUNITY): Payer: Self-pay | Admitting: Student

## 2024-03-15 VITALS — BP 136/78 | HR 73 | Resp 16 | Ht 72.0 in | Wt 210.0 lb

## 2024-03-15 DIAGNOSIS — I2694 Multiple subsegmental pulmonary emboli without acute cor pulmonale: Secondary | ICD-10-CM

## 2024-03-15 DIAGNOSIS — R911 Solitary pulmonary nodule: Secondary | ICD-10-CM | POA: Diagnosis present

## 2024-03-15 DIAGNOSIS — Z8669 Personal history of other diseases of the nervous system and sense organs: Secondary | ICD-10-CM | POA: Diagnosis not present

## 2024-03-15 DIAGNOSIS — I480 Paroxysmal atrial fibrillation: Secondary | ICD-10-CM

## 2024-03-15 DIAGNOSIS — Z79899 Other long term (current) drug therapy: Secondary | ICD-10-CM | POA: Diagnosis not present

## 2024-03-15 DIAGNOSIS — M0579 Rheumatoid arthritis with rheumatoid factor of multiple sites without organ or systems involvement: Secondary | ICD-10-CM

## 2024-03-15 DIAGNOSIS — H18891 Other specified disorders of cornea, right eye: Secondary | ICD-10-CM | POA: Diagnosis not present

## 2024-03-15 DIAGNOSIS — I4891 Unspecified atrial fibrillation: Principal | ICD-10-CM | POA: Diagnosis present

## 2024-03-15 DIAGNOSIS — I5020 Unspecified systolic (congestive) heart failure: Secondary | ICD-10-CM | POA: Insufficient documentation

## 2024-03-15 DIAGNOSIS — Z7982 Long term (current) use of aspirin: Secondary | ICD-10-CM | POA: Insufficient documentation

## 2024-03-15 DIAGNOSIS — M05771 Rheumatoid arthritis with rheumatoid factor of right ankle and foot without organ or systems involvement: Secondary | ICD-10-CM | POA: Diagnosis not present

## 2024-03-15 DIAGNOSIS — L570 Actinic keratosis: Secondary | ICD-10-CM

## 2024-03-15 DIAGNOSIS — I2699 Other pulmonary embolism without acute cor pulmonale: Secondary | ICD-10-CM | POA: Diagnosis present

## 2024-03-15 DIAGNOSIS — M05741 Rheumatoid arthritis with rheumatoid factor of right hand without organ or systems involvement: Secondary | ICD-10-CM

## 2024-03-15 DIAGNOSIS — I2609 Other pulmonary embolism with acute cor pulmonale: Secondary | ICD-10-CM | POA: Diagnosis not present

## 2024-03-15 DIAGNOSIS — M05742 Rheumatoid arthritis with rheumatoid factor of left hand without organ or systems involvement: Secondary | ICD-10-CM | POA: Diagnosis not present

## 2024-03-15 DIAGNOSIS — I10 Essential (primary) hypertension: Secondary | ICD-10-CM | POA: Diagnosis present

## 2024-03-15 DIAGNOSIS — M069 Rheumatoid arthritis, unspecified: Secondary | ICD-10-CM | POA: Diagnosis not present

## 2024-03-15 DIAGNOSIS — I110 Hypertensive heart disease with heart failure: Secondary | ICD-10-CM | POA: Diagnosis not present

## 2024-03-15 DIAGNOSIS — J9601 Acute respiratory failure with hypoxia: Secondary | ICD-10-CM | POA: Diagnosis not present

## 2024-03-15 DIAGNOSIS — E785 Hyperlipidemia, unspecified: Secondary | ICD-10-CM

## 2024-03-15 DIAGNOSIS — Z1152 Encounter for screening for COVID-19: Secondary | ICD-10-CM | POA: Insufficient documentation

## 2024-03-15 DIAGNOSIS — M05772 Rheumatoid arthritis with rheumatoid factor of left ankle and foot without organ or systems involvement: Secondary | ICD-10-CM

## 2024-03-15 DIAGNOSIS — R0602 Shortness of breath: Secondary | ICD-10-CM | POA: Diagnosis present

## 2024-03-15 DIAGNOSIS — R002 Palpitations: Secondary | ICD-10-CM | POA: Diagnosis not present

## 2024-03-15 LAB — ECHOCARDIOGRAM COMPLETE
AR max vel: 2.66 cm2
AV Area VTI: 3.03 cm2
AV Area mean vel: 1.91 cm2
AV Mean grad: 1.7 mmHg
AV Peak grad: 3.3 mmHg
Ao pk vel: 0.9 m/s
Area-P 1/2: 4.12 cm2
Calc EF: 62.2 %
Height: 72 in
MV M vel: 4.77 m/s
MV Peak grad: 90.8 mmHg
MV VTI: 2.35 cm2
Radius: 0.4 cm
S' Lateral: 4 cm
Single Plane A2C EF: 64.8 %
Single Plane A4C EF: 56.9 %
Weight: 3280 [oz_av]

## 2024-03-15 LAB — BASIC METABOLIC PANEL WITH GFR
Anion gap: 11 (ref 5–15)
BUN: 24 mg/dL — ABNORMAL HIGH (ref 8–23)
CO2: 23 mmol/L (ref 22–32)
Calcium: 9.4 mg/dL (ref 8.9–10.3)
Chloride: 105 mmol/L (ref 98–111)
Creatinine, Ser: 1.03 mg/dL (ref 0.61–1.24)
GFR, Estimated: >60 mL/min (ref >60)
Glucose, Bld: 95 mg/dL (ref 70–99)
Potassium: 4.3 mmol/L (ref 3.5–5.1)
Sodium: 139 mmol/L (ref 135–145)

## 2024-03-15 LAB — CBC
HCT: 44.4 % (ref 39.0–52.0)
Hemoglobin: 15.3 g/dL (ref 13.0–17.0)
MCH: 35.1 pg — ABNORMAL HIGH (ref 26.0–34.0)
MCHC: 34.5 g/dL (ref 30.0–36.0)
MCV: 101.8 fL — ABNORMAL HIGH (ref 80.0–100.0)
Platelets: 291 10*3/uL (ref 150–400)
RBC: 4.36 MIL/uL (ref 4.22–5.81)
RDW: 14.4 % (ref 11.5–15.5)
WBC: 7.5 10*3/uL (ref 4.0–10.5)
nRBC: 0 % (ref 0.0–0.2)

## 2024-03-15 LAB — RESP PANEL BY RT-PCR (RSV, FLU A&B, COVID)  RVPGX2
Influenza A by PCR: NEGATIVE
Influenza B by PCR: NEGATIVE
Resp Syncytial Virus by PCR: NEGATIVE
SARS Coronavirus 2 by RT PCR: NEGATIVE

## 2024-03-15 LAB — HEPARIN LEVEL (UNFRACTIONATED): Heparin Unfractionated: 0.6 [IU]/mL (ref 0.30–0.70)

## 2024-03-15 LAB — BRAIN NATRIURETIC PEPTIDE: B Natriuretic Peptide: 387.4 pg/mL — ABNORMAL HIGH (ref 0.0–100.0)

## 2024-03-15 LAB — LACTIC ACID, PLASMA: Lactic Acid, Venous: 1 mmol/L (ref 0.5–1.9)

## 2024-03-15 LAB — D-DIMER, QUANTITATIVE: D-Dimer, Quant: 5.1 ug{FEU}/mL — ABNORMAL HIGH (ref 0.00–0.50)

## 2024-03-15 LAB — TROPONIN I (HIGH SENSITIVITY)
Troponin I (High Sensitivity): 7 ng/L (ref <18)
Troponin I (High Sensitivity): 8 ng/L (ref <18)

## 2024-03-15 MED ORDER — LEVALBUTEROL HCL 0.63 MG/3ML IN NEBU: 0.6300 mg | INHALATION_SOLUTION | Freq: Four times a day (QID) | RESPIRATORY_TRACT | Status: DC | PRN

## 2024-03-15 MED ORDER — IOHEXOL 350 MG/ML SOLN
100.0000 mL | Freq: Once | INTRAVENOUS | Status: AC | PRN
  Administered 2024-03-15: 100 mL via INTRAVENOUS

## 2024-03-15 MED ORDER — ONDANSETRON HCL 4 MG PO TABS: 4.0000 mg | ORAL_TABLET | Freq: Four times a day (QID) | ORAL | Status: DC | PRN

## 2024-03-15 MED ORDER — DILTIAZEM HCL-DEXTROSE 125-5 MG/125ML-% IV SOLN (PREMIX)
5.0000 mg/h | INTRAVENOUS | Status: DC
  Administered 2024-03-15 (×2): 5 mg/h via INTRAVENOUS
  Filled 2024-03-15 (×2): qty 125

## 2024-03-15 MED ORDER — LACTATED RINGERS IV BOLUS
500.0000 mL | Freq: Once | INTRAVENOUS | Status: DC
Start: 2024-03-15 — End: 2024-03-17

## 2024-03-15 MED ORDER — ONDANSETRON HCL 4 MG/2ML IJ SOLN: 4.0000 mg | Freq: Four times a day (QID) | INTRAMUSCULAR | Status: DC | PRN

## 2024-03-15 MED ORDER — HEPARIN BOLUS VIA INFUSION
5500.0000 [IU] | Freq: Once | INTRAVENOUS | Status: AC
  Administered 2024-03-15: 5500 [IU] via INTRAVENOUS

## 2024-03-15 MED ORDER — DILTIAZEM LOAD VIA INFUSION
20.0000 mg | Freq: Once | INTRAVENOUS | Status: AC
  Administered 2024-03-15: 20 mg via INTRAVENOUS
  Filled 2024-03-15: qty 20

## 2024-03-15 MED ORDER — HEPARIN (PORCINE) 25000 UT/250ML-% IV SOLN
1300.0000 [IU]/h | INTRAVENOUS | Status: AC
  Administered 2024-03-15 – 2024-03-16 (×3): 1300 [IU]/h via INTRAVENOUS
  Filled 2024-03-15 (×3): qty 250

## 2024-03-15 NOTE — Patient Instructions (Signed)
 Standing Labs We placed an order today for your standing lab work.   Please have your standing labs drawn in [place] and every 3 months   Please have your labs drawn 2 weeks prior to your appointment so that the provider can discuss your lab results at your appointment, if possible.  Please note that you [place] see your imaging and lab results in MyChart before we have reviewed them. We will contact you once all results are reviewed. Please allow our office up to 72 hours to thoroughly review all of the results before contacting the office for clarification of your results.  WALK-IN LAB HOURS  Monday through Thursday from 8:00 am -12:30 pm and 1:00 pm-5:00 pm and Friday from 8:00 am-12:00 pm.  Patients with office visits requiring labs will be seen before walk-in labs.  You [place] encounter longer than normal wait times. Please allow additional time. Wait times [place] be shorter on  Monday and Thursday afternoons.  We do not book appointments for walk-in labs. We appreciate your patience and understanding with our staff.   Labs are drawn by [facility]. Please bring your co-pay at the time of your lab draw.  You [place] receive a bill from [facility] for your lab work.  Please note if you are on Hydroxychloroquine and and an order has been placed for a Hydroxychloroquine level,  you will need to have it drawn 4 hours or more after your last dose.  If you wish to have your labs drawn at another location, please call the office 24 hours in advance so we can fax the orders.  The office is located at [address], [address], [place], [place] [address]   If you have any questions regarding directions or hours of operation,  please call [phone].   As a reminder, please drink plenty of water prior to coming for your lab work. Thanks!

## 2024-03-15 NOTE — Progress Notes (Signed)
 PHARMACY - ANTICOAGULATION CONSULT NOTE  Pharmacy Consult for heparin Indication: atrial fibrillation  Allergies  Allergen Reactions   Vicodin [Hydrocodone-Acetaminophen] Nausea And Vomiting    Patient Measurements: Height: 6' (182.9 cm) Weight: 93 kg (205 lb) IBW/kg (Calculated) : 77.6 HEPARIN DW (KG): 93  Vital Signs: Temp: 98.1 F (36.7 C) (04/01 0904) BP: 138/107 (04/01 0904) Pulse Rate: 145 (04/01 0904)  Labs: No results for input(s): "HGB", "HCT", "PLT", "APTT", "LABPROT", "INR", "HEPARINUNFRC", "HEPRLOWMOCWT", "CREATININE", "CKTOTAL", "CKMB", "TROPONINIHS" in the last 72 hours.  CrCl cannot be calculated (Patient's most recent lab result is older than the maximum 21 days allowed.).   Medical History: Past Medical History:  Diagnosis Date   Abdominal hernia 11/28/2015   Allergic rhinitis    Anxiety    BPV (benign positional vertigo), right 05/11/2018   Cardiac arrhythmia    ED (erectile dysfunction)    Herpes    History of syncope    in the setting of anxiety per cardiology note   Hypertension    Kidney tumor (benign), right    sx 02/27/2015   Meningioma (HCC)    had surgery    Paroxysmal atrial fibrillation (HCC) 07/15/2012   a) CHADSVASc score 1b) On full-dose ASA and Verapamil   RA (rheumatoid arthritis) (HCC)    Seizures (HCC) 05/14/2011   "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    Vasovagal syncope     Medications:  Infusions:   diltiazem (CARDIZEM) infusion     heparin      Assessment: 72 yom presented to the ED with tachycardia. Now starting IV heparin for afib. CBC is WNL. He is not on anticoagulation PTA.   Goal of Therapy:  Heparin level 0.3-0.7 units/ml Monitor platelets by anticoagulation protocol: Yes   Plan:  Heparin bolus 5500 units IV x 1 Heparin gtt 1300 units/hr Check an 8 hr heparin level Daily heparin level and CBC  Cova Knieriem, Drake Leach 03/15/2024,9:31 AM

## 2024-03-15 NOTE — Telephone Encounter (Signed)
 Patient Product/process development scientist completed.    The patient is insured through [employer]. Patient has Medicare and is not eligible for a copay card, but may be able to apply for patient assistance or Medicare RX Payment Plan (Patient Must reach out to their plan, if eligible for payment plan), if available.    Ran test claim for Eliquis 5 mg and the current 30 day co-pay is $152.71.  Ran test claim for Xarelto 20 mg and the current 30 day co-pay is $150.63.  This test claim was processed through [facility]- copay amounts may vary at other pharmacies due to pharmacy/plan contracts, or as the patient moves through the different stages of their insurance plan.     Max [name], CPHT Pharmacy Technician III Certified Patient Advocate [facility] Patient Advocate Team Direct Number: [phone]  Fax: [phone]

## 2024-03-15 NOTE — Progress Notes (Signed)
 PHARMACY - ANTICOAGULATION CONSULT NOTE  Pharmacy Consult for heparin Indication: atrial fibrillation  Allergies  Allergen Reactions   Vicodin [Hydrocodone-Acetaminophen] Nausea And Vomiting    Patient Measurements: Height: (P) 6' (182.9 cm) Weight: 93 kg (205 lb) IBW/kg (Calculated) : (P) 77.6 HEPARIN DW (KG): 93  Vital Signs: Temp: 97.9 F (36.6 C) (04/01 1800) Temp Source: Oral (04/01 1800) BP: 133/73 (04/01 1800) Pulse Rate: 94 (04/01 1800)  Labs: Recent Labs    03/15/24 0913 03/15/24 0923 03/15/24 1120 03/15/24 1728  HGB 15.3  --   --   --   HCT 44.4  --   --   --   PLT 291  --   --   --   HEPARINUNFRC  --   --   --  0.60  CREATININE 1.03  --   --   --   TROPONINIHS  --  7 8  --     Estimated Creatinine Clearance: 70.1 mL/min (by C-G formula based on SCr of 1.03 mg/dL).   Medical History: Past Medical History:  Diagnosis Date   Abdominal hernia 11/28/2015   Allergic rhinitis    Anxiety    BPV (benign positional vertigo), right 05/11/2018   Cardiac arrhythmia    ED (erectile dysfunction)    Herpes    History of syncope    in the setting of anxiety per cardiology note   Hypertension    Kidney tumor (benign), right    sx 02/27/2015   Meningioma (HCC)    had surgery    Paroxysmal atrial fibrillation (HCC) 07/15/2012   a) CHADSVASc score 1b) On full-dose ASA and Verapamil   RA (rheumatoid arthritis) (HCC)    Seizures (HCC) 05/14/2011   "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    Vasovagal syncope     Medications:  Infusions:   diltiazem (CARDIZEM) infusion 7.5 mg/hr (03/15/24 1517)   heparin 1,300 Units/hr (03/15/24 1517)   lactated ringers Stopped (03/15/24 1120)    Assessment: 72 yom presented to the ED with tachycardia. Now starting IV heparin for afib. CBC is WNL. He is not on anticoagulation PTA.   Today, 03/15/24 17:28 Heparin level 0.6, therapeutic, with IV heparin infusing at 1300 units/hour No bleeding or heparin related issues per  nurse  Goal of Therapy:  Heparin level 0.3-0.7 units/ml Monitor platelets by anticoagulation protocol: Yes   Plan:  Continue IV Heparin infusion at  1300 units/hr Check a confirmatory 8 hr heparin level Monitor daily heparin level, CBC, signs/symptoms of bleeding   Thank you for allowing pharmacy to be a part of this patient's care.  Selinda Eon, PharmD, BCPS Clinical Pharmacist [place] 03/15/2024 7:23 PM

## 2024-03-15 NOTE — ED Triage Notes (Signed)
 Pt Max [name], ambulatory from rheumatologist reporting he get further eval at PCP d/t fast HR. Pt reports he was started on verapamil years ago for palpitations, which he has been compliant with. Pt c/o SOB on exertion x1 wk.

## 2024-03-15 NOTE — Telephone Encounter (Signed)
 Copied from CRM [id number]. Topic: Clinical - Red Word Triage >> Mar 15, 2024  8:36 AM Saverio Danker wrote: Red Word that prompted transfer to Nurse Triage: short of breath   Chief Complaint: Shortness of breath Symptoms: Shortness of breath, fatigue, bilat ankle swelling Frequency: constant Pertinent Negatives: Patient denies cough or fever Disposition: [x] ED /[] Urgent Care (no appt availability in office) / [] Appointment(In office/virtual)/ []  [facility]/ [] Home Care/ [] Refused Recommended Disposition /[] [place]/ []  Follow-up with PCP Additional Notes: Seen at rheumatology appt this morning. Pt endorsing shortness of breath x  1 week, getting worse. Per wife, patient has to take deep breaths at night to catch his breath, but wakes up after 30 minutes feeling like he cannot breath.  No cough or fever reports. Per wife, rheumatologist advised that patient was tachycardic.  Wife also reports that patients ankles were swollen and that he's been weak lately as well. RN advising ED. Wife agreeable, states they will go today.   Reason for Disposition  [1] MODERATE difficulty breathing (e.g., speaks in phrases, SOB even at rest, pulse 100-120) AND [2] NEW-onset or WORSE than normal  Answer Assessment - Initial Assessment Questions 1. RESPIRATORY STATUS: "Describe your breathing?" (e.g., wheezing, shortness of breath, unable to speak, severe coughing)      Short of breath  2. ONSET: "When did this breathing problem begin?"      Started 1 week ago  3. PATTERN "Does the difficult breathing come and go, or has it been constant since it started?"      Comes and goes, but getting worse  4. SEVERITY: "How bad is your breathing?" (e.g., mild, moderate, severe)    - MILD: No SOB at rest, mild SOB with walking, speaks normally in sentences, can lie down, no retractions, pulse < 100.    - MODERATE: SOB at rest, SOB with minimal exertion and prefers to sit, cannot lie down flat, speaks  in phrases, mild retractions, audible wheezing, pulse 100-120.    - SEVERE: Very SOB at rest, speaks in single words, struggling to breathe, sitting hunched forward, retractions, pulse > 120      Moderate, waking up feeling like he can't breath  5. RECURRENT SYMPTOM: "Have you had difficulty breathing before?" If Yes, ask: "When was the last time?" and "What happened that time?"      No  6. CARDIAC HISTORY: "Do you have any history of heart disease?" (e.g., heart attack, angina, bypass surgery, angioplasty)      No  7. LUNG HISTORY: "Do you have any history of lung disease?"  (e.g., pulmonary embolus, asthma, emphysema)     No  8. CAUSE: "What do you think is causing the breathing problem?"      Unsure of cause  9. OTHER SYMPTOMS: "Do you have any other symptoms? (e.g., dizziness, runny nose, cough, chest pain, fever)     Bilateral ankle swelling; weak when standing  10. O2 SATURATION MONITOR:  "Do you use an oxygen saturation monitor (pulse oximeter) at home?" If Yes, ask: "What is your reading (oxygen level) today?" "What is your usual oxygen saturation reading?" (e.g., 95%)       95% today at Dr. Alfonzo Beers  11. PREGNANCY: "Is there any chance you are pregnant?" "When was your last menstrual period?"       N/A  12. TRAVEL: "Have you traveled out of the country in the last month?" (e.g., travel history, exposures)       No  Protocols  used: Breathing Difficulty-A-AH

## 2024-03-15 NOTE — ED Provider Notes (Addendum)
 [place] EMERGENCY DEPARTMENT AT [facility] Provider Note   CSN: [id number] Arrival date & time: 03/15/24  0857     History  Chief Complaint  Patient presents with   Palpitations    Max [name] is a 73 y.o. male.   Palpitations Associated symptoms: shortness of breath   Associated symptoms: no chest pain and no cough      73 year old male with medical history significant for hypertensions, rheumatoid arthritis, paroxysmal atrial fibrillation on verapamil not on anticoagulation who presents to the emergency department with palpitations and shortness of breath.  The patient was sent from his rheumatologist due to fast heart rate.  He states that he has had shortness of breath on exertion with palpitations for the past week.  He has been compliant with his home verapamil.  He denies any chest pain.  He denies any cough, fever or chills.  He denies any significant lower extremity swelling.  He denies any history of heart failure.  Home Medications Prior to Admission medications   Medication Sig Start Date End Date Taking? Authorizing Provider  acyclovir (ZOVIRAX) 800 MG tablet Take 1 tablet (800 mg total) by mouth 3 (three) times daily as needed. For outbreaks 11/25/22   Ardith Dark, MD  Ascorbic Acid (VITAMIN C PO) Take 1 tablet by mouth daily.    [provider]  aspirin 81 MG chewable tablet Chew by mouth daily.    [provider]  Cholecalciferol (VITAMIN D3 PO) Take by mouth.    [provider]  Coenzyme Q10 (CO Q 10 PO) Take by mouth daily.    [provider]  folic acid (FOLVITE) 1 MG tablet Take 2 tablets (2 mg total) by mouth daily. 08/04/23   Gearldine Bienenstock, PA-C  methotrexate (RHEUMATREX) 2.5 MG tablet TAKE 8 TABLETS (20 MG TOTAL) BY MOUTH ONCE A WEEK. CAUTION:CHEMOTHERAPY. PROTECT FROM LIGHT. 01/22/24   Gearldine Bienenstock, PA-C  Multiple Vitamin (MULTIVITAMIN) tablet Take 1 tablet by mouth daily.    [provider]   Probiotic Product (PROBIOTIC PO) Take by mouth daily.    [provider]  verapamil (CALAN-SR) 180 MG CR tablet Take 1 tablet (180 mg total) by mouth daily. 02/23/24   Ardith Dark, MD      Allergies    Vicodin [hydrocodone-acetaminophen]    Review of Systems   Review of Systems  Respiratory:  Positive for shortness of breath. Negative for cough.   Cardiovascular:  Positive for palpitations. Negative for chest pain.  All other systems reviewed and are negative.   Physical Exam Updated Vital Signs BP (!) 144/83   Pulse (!) 59   Temp 98.1 F (36.7 C)   Resp (!) 23   Ht 6' (1.829 m)   Wt 93 kg   SpO2 94%   BMI 27.80 kg/m  Physical Exam Vitals and nursing note reviewed.  Constitutional:      General: He is not in acute distress.    Appearance: He is well-developed.  HENT:     Head: Normocephalic and atraumatic.  Eyes:     Conjunctiva/sclera: Conjunctivae normal.  Cardiovascular:     Rate and Rhythm: Tachycardia present. Rhythm irregular.  Pulmonary:     Effort: Pulmonary effort is normal. No respiratory distress.     Breath sounds: Normal breath sounds.  Abdominal:     Palpations: Abdomen is soft.     Tenderness: There is no abdominal tenderness.  Musculoskeletal:  General: No swelling.     Cervical back: Neck supple.  Skin:    General: Skin is warm and dry.     Capillary Refill: Capillary refill takes less than 2 seconds.  Neurological:     Mental Status: He is alert.  Psychiatric:        Mood and Affect: Mood normal.     ED Results / Procedures / Treatments   Labs (all labs ordered are listed, but only abnormal results are displayed) Labs Reviewed  BASIC METABOLIC PANEL WITH GFR - Abnormal; Notable for the following components:      Result Value   BUN 24 (*)    All other components within normal limits  CBC - Abnormal; Notable for the following components:   MCV 101.8 (*)    MCH 35.1 (*)    All other components within normal limits   BRAIN NATRIURETIC PEPTIDE - Abnormal; Notable for the following components:   B Natriuretic Peptide 387.4 (*)    All other components within normal limits  D-DIMER, QUANTITATIVE - Abnormal; Notable for the following components:   D-Dimer, Quant 5.10 (*)    All other components within normal limits  RESP PANEL BY RT-PCR (RSV, FLU A&B, COVID)  [id number]  HEPARIN LEVEL (UNFRACTIONATED)  LACTIC ACID, PLASMA  LACTIC ACID, PLASMA  TROPONIN I (HIGH SENSITIVITY)  TROPONIN I (HIGH SENSITIVITY)    EKG EKG Interpretation Date/Time:  Tuesday March 15 2024 09:59:27 EDT Ventricular Rate:  95 PR Interval:    QRS Duration:  103 QT Interval:  395 QTC Calculation: 497 R Axis:   114  Text Interpretation: Atrial fibrillation Right axis deviation Nonspecific T abnrm, anterolateral leads Borderline prolonged QT interval Rate improved since previous tracing Confirmed by Ernie Avena ([id number]) on 03/15/2024 10:05:59 AM  Radiology DG Chest Port 1 View Result Date: 03/15/2024 CLINICAL DATA:  5107 Atrial fibrillation (HCC) 5107 EXAM: PORTABLE CHEST 1 VIEW COMPARISON:  12/11/2019. FINDINGS: Linear area of atelectasis/scarring noted at the lung bases. Bilateral lung fields are otherwise clear. No dense consolidation, lung collapse or pulmonary edema. Bilateral costophrenic angles are clear. Normal cardio-mediastinal silhouette. No acute osseous abnormalities. The soft tissues are within normal limits. IMPRESSION: No active disease. Electronically Signed   By: Jules Schick M.D.   On: 03/15/2024 10:50    Procedures .Critical Care  Performed by: Ernie Avena, MD Authorized by: Ernie Avena, MD   Critical care provider statement:    Critical care time (minutes):  50   Critical care was time spent personally by me on the following activities:  Development of treatment plan with patient or surrogate, discussions with consultants, evaluation of patient's response to treatment, examination of patient, ordering and  review of laboratory studies, ordering and review of radiographic studies, ordering and performing treatments and interventions, pulse oximetry, re-evaluation of patient's condition and review of old charts   Care discussed with: admitting provider       Medications Ordered in ED Medications  diltiazem (CARDIZEM) 1 mg/mL load via infusion 20 mg (20 mg Intravenous Bolus from Bag 03/15/24 0945)    And  diltiazem (CARDIZEM) 125 mg in dextrose 5% 125 mL (1 mg/mL) infusion (5 mg/hr Intravenous New Bag/Given 03/15/24 0945)  heparin ADULT infusion 100 units/mL (25000 units/240mL) (1,300 Units/hr Intravenous New Bag/Given 03/15/24 0956)  lactated ringers bolus 500 mL (has no administration in time range)  heparin bolus via infusion 5,500 Units (5,500 Units Intravenous Bolus from Bag 03/15/24 0956)  iohexol (OMNIPAQUE) 350 MG/ML injection 100 mL (100  mLs Intravenous Contrast Given 03/15/24 1139)    ED Course/ Medical Decision Making/ A&P Clinical Course as of 03/15/24 1232  Tue Mar 15, 2024  1005 B Natriuretic Peptide(!): 387.4 [JL]    Clinical Course User Index [JL] Ernie Avena, MD         CHA2DS2-VASc Score: 2                        Medical Decision Making Amount and/or Complexity of Data Reviewed Labs: ordered. Decision-making details documented in ED Course. Radiology: ordered.  Risk Prescription drug management. Decision regarding hospitalization.     73 year old male with medical history significant for hypertensions, rheumatoid arthritis, paroxysmal atrial fibrillation on verapamil not on anticoagulation who presents to the emergency department with palpitations and shortness of breath.  The patient was sent from his rheumatologist due to fast heart rate.  He states that he has had shortness of breath on exertion with palpitations for the past week.  He has been compliant with his home verapamil.  He denies any chest pain.  He denies any cough, fever or chills.  He denies any significant  lower extremity swelling.  He denies any history of heart failure.  On arrival, the patient was afebrile, tachycardic heart rate 145, irregularly irregular rhythm noted on cardiac telemetry and on auscultation and palpation of the patient's pulses.  Patient hemodynamically stable, BP 138/107, saturating 94% on room air, not tachypneic.  Lungs clear to auscultation bilaterally.  Atrial fibrillation with RVR confirmed on EKG, heart rate 141, some likely rate related repolarization ST depressions present.  IV access was obtained and the patient was started on IV heparin and IV diltiazem.  CHA2DS2-VASc score of 2.  CXR:  IMPRESSION:  No active disease.    Labs: BNP moderately elevated at 387, D-dimer elevated at 5.10, initial cardiac troponin 7, BMP unremarkable, mildly elevated BUN at 24, creatinine 1.03, CBC without a leukocytosis or anemia, COVID, flu, RSV PCR testing negative.  Repeat troponin pending.  CTA PE study ordered in the setting of the patient's elevated D-dimer and atrial fibrillation with RVR: Pending.  Consult for admission: Dr. Austin Miles accepted the patient in admission for progressive/stepdown.  Patient hemodynamically stable at time of admission, mildly tachypneic, saturating 95% on room air, BP 120/87 with a MAP of 98, heart rate rate controlled at 78 on Dilt 5mg /hr.   CTA PE: On my read, the patient was found to have PE burden, heart does not appear to be significantly enlarged and his initial cardiac troponin is normal.  Suspect patient is likely stable for continued admission to stepdown.  Dr. Austin Miles notified.  Spoke with Dr. Celine Mans of critical care who agreed with plan for admission, asked for lactic acid to be ordered will see the patient in consultation.  Repeat troponin stable at 8.    CTA PE: IMPRESSION:  1. Bilateral pulmonary emboli, within the upper and lower lobe  segmental and subsegmental branches. Findings concerning for right  heart failure.  2. Stable 1.7 x  1.6 cm pulmonary nodule in the right lower lobe,  previously 1.7 x 1.5 cm, essentially unchanged dating back to  September 2018, most compatible with a benign etiology.  3.  Aortic Atherosclerosis (ICD10-I70.0).    These results were called by telephone at the time of interpretation  on 03/15/2024 at 1:24 pm to provider [facility] , who verbally  acknowledged these results.   Pt placed on 2L O2 prior to admission. Patient hemodynamically  stable at time of admission.    Final Clinical Impression(s) / ED Diagnoses Final diagnoses:  Atrial fibrillation with RVR (HCC)  Acute pulmonary embolism, unspecified pulmonary embolism type, unspecified whether acute cor pulmonale present [facility])    Rx / DC Orders ED Discharge Orders     None         Ernie Avena, MD 03/15/24 1232    Ernie Avena, MD 03/15/24 1233    Ernie Avena, MD 03/15/24 1425

## 2024-03-15 NOTE — Consult Note (Addendum)
 NAME:  Max [name], MRN:  [id number], DOB:  [birth year], LOS: 0 ADMISSION DATE:  03/15/2024, CONSULTATION DATE:  4/1 REFERRING MD:  Dr. Austin Miles, CHIEF COMPLAINT:  PE   History of Present Illness:  Patient is a 73 yo M w/ pertinent PMH PAF not on AC, RA, HTN, HLD, Prediabetes presents to [facility] ED on 4/1 w/ PE.  Patient seen by Rheum on 4/1 and was sent to ED for tachycardia and SOB. Patient having increased sob over the past week. Denies any chest pain, fever, LE swelling. On arrival BP stable. HR irregularly irregular with rates up to 140s at times. Sats 98% on room air. Patient started on IV heparin and diltiazem. CXR no significant findings. BNP 387 and D-dimer 5.1. Trop 7 then 8. LA 1. CTA chest w/ b/l PE within upper and lower lobe segmental and subsegmental branches; stable 1.7x1.6 cm pulm nodule. TRH to admit. PCCM to consult.  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 rate (!) 24, height 6' (1.829 m), weight 93 kg, SpO2 96%.        Intake/Output Summary (Last 24 hours) at 03/15/2024 1342 Last data filed  at 03/15/2024 1236 Gross per 24 hour  Intake 29.84 ml  Output --  Net 29.84 ml   Filed Weights   03/15/24 0910  Weight: 93 kg    Examination: General:  NAD HEENT: MM pink/moist Neuro: Aox3; MAE CV: s1s2, irregularly irregular, no m/r/g PULM:  dim clear BS bilaterally; [name] 2L sats 100% GI: soft, bsx4 active  Extremities: warm/dry, trace ble edema  Skin: no rashes or lesions    Resolved Hospital Problem list     Assessment & Plan:  B/L PE Plan: -tele monitoring -heparin IV -echo -dvt US -patient hemodynamically stable; no need for IR intervention at this time  Best Practice (right click and "Reselect all SmartList Selections" daily)   Per primary  Labs   CBC: Recent Labs  Lab 03/15/24 0913  WBC 7.5  HGB 15.3  HCT 44.4  MCV 101.8*  PLT 291    Basic Metabolic Panel: Recent Labs  Lab 03/15/24 0913  NA 139  K 4.3  CL 105  CO2 23  GLUCOSE 95  BUN 24*  CREATININE 1.03  CALCIUM 9.4   GFR: Estimated Creatinine Clearance: 70.1 mL/min (by C-G formula based on SCr of 1.03 mg/dL). Recent Labs  Lab 03/15/24 0913  WBC 7.5    Liver Function Tests: No results for input(s): "  AST", "ALT", "ALKPHOS", "BILITOT", "PROT", "ALBUMIN" in the last 168 hours. No results for input(s): "LIPASE", "AMYLASE" in the last 168 hours. No results for input(s): "AMMONIA" in the last 168 hours.  ABG No results found for: "PHART", "PCO2ART", "PO2ART", "HCO3", "TCO2", "ACIDBASEDEF", "O2SAT"   Coagulation Profile: No results for input(s): "INR", "PROTIME" in the last 168 hours.  Cardiac Enzymes: No results for input(s): "CKTOTAL", "CKMB", "CKMBINDEX", "TROPONINI" in the last 168 hours.  HbA1C: Hgb A1c MFr Bld  Date/Time Value Ref Range Status  02/23/2024 09:17 AM 6.1 4.6 - 6.5 % Final    Comment:    Glycemic Control Guidelines for People with Diabetes:Non Diabetic:  <6%Goal of Therapy: <7%Additional Action Suggested:  >8%   12/18/2022 09:02 AM 6.1 4.6 - 6.5 % Final     Comment:    Glycemic Control Guidelines for People with Diabetes:Non Diabetic:  <6%Goal of Therapy: <7%Additional Action Suggested:  >8%     CBG: No results for input(s): "GLUCAP" in the last 168 hours.  Review of Systems:   Review of Systems  Constitutional:  Negative for fever.  Respiratory:  Positive for shortness of breath.   Cardiovascular:  Negative for chest pain.  Gastrointestinal:  Negative for abdominal pain, nausea and vomiting.     Past Medical History:  He,  has a past medical history of Abdominal hernia (11/28/2015), Allergic rhinitis, Anxiety, BPV (benign positional vertigo), right (05/11/2018), Cardiac arrhythmia, ED (erectile dysfunction), Herpes, History of syncope, Hypertension, Kidney tumor (benign), right, Meningioma (HCC), Paroxysmal atrial fibrillation (HCC) (07/15/2012), RA (rheumatoid arthritis) (HCC), Seizures (HCC) (05/14/2011), and Vasovagal syncope.   Surgical History:   Past Surgical History:  Procedure Laterality Date   BRAIN MENINGIOMA EXCISION  2012   CATARACT EXTRACTION W/ INTRAOCULAR LENS IMPLANT  ~ 2001   right   COLONOSCOPY  2008   EYE SURGERY  1964   right; "hit w/baseball; eye hemorrhaged"   FLEXIBLE SIGMOIDOSCOPY     REFRACTIVE SURGERY  ~ 2003   ROBOTIC ASSITED PARTIAL NEPHRECTOMY Right 02/29/2016   Procedure: XI ROBOTIC ASSITED PARTIAL NEPHRECTOMY;  Surgeon: Sebastian Ache, MD;  Location: [facility];  Service: Urology;  Laterality: Right;   NEEDS ULTRASOUND     Social History:   reports that he has never smoked. He has been exposed to tobacco smoke. He has never used smokeless tobacco. He reports that he does not drink alcohol and does not use drugs.   Family History:  His family history includes Depression in an other family member; GI problems in an other family member; Healthy in his son; Hypertension in his brother and another family member; Lung disease in an other family member; Prostate cancer in his brother and paternal grandfather.  There is no history of Colon cancer, Esophageal cancer, or Ulcerative colitis.   Allergies Allergies  Allergen Reactions   Vicodin [Hydrocodone-Acetaminophen] Nausea And Vomiting     Home Medications  Prior to Admission medications   Medication Sig Start Date End Date Taking? Authorizing Provider  acyclovir (ZOVIRAX) 800 MG tablet Take 1 tablet (800 mg total) by mouth 3 (three) times daily as needed. For outbreaks 11/25/22   Ardith Dark, MD  Ascorbic Acid (VITAMIN C PO) Take 1 tablet by mouth daily.    [provider]  aspirin 81 MG chewable tablet Chew by mouth daily.    [provider]  Cholecalciferol (VITAMIN D3 PO) Take by mouth.    [provider]  Coenzyme Q10 (CO Q 10 PO) Take by mouth daily.  [provider]  folic acid (FOLVITE) 1 MG tablet Take 2 tablets (2 mg total) by mouth daily. 08/04/23   Gearldine Bienenstock, PA-C  methotrexate (RHEUMATREX) 2.5 MG tablet TAKE 8 TABLETS (20 MG TOTAL) BY MOUTH ONCE A WEEK. CAUTION:CHEMOTHERAPY. PROTECT FROM LIGHT. 01/22/24   Gearldine Bienenstock, PA-C  Multiple Vitamin (MULTIVITAMIN) tablet Take 1 tablet by mouth daily.    [provider]  Probiotic Product (PROBIOTIC PO) Take by mouth daily.    [provider]  verapamil (CALAN-SR) 180 MG CR tablet Take 1 tablet (180 mg total) by mouth daily. 02/23/24   Ardith Dark, MD         [facility] 03/15/2024, 1:42 PM  Please see [email or web address] for pager details.  From 7A-7P if no response, please call [phone]. After hours, please call ELink [phone].

## 2024-03-15 NOTE — H&P (Addendum)
 History and Physical    Patient: Max [name] XBM:[id number] DOB: [birth year] DOA: 03/15/2024 DOS: the patient was seen and examined on 03/15/2024 PCP: Ardith Dark, MD  Patient coming from: Home  Chief Complaint: Palpitations Chief Complaint  Patient presents with   Palpitations   HPI: Max [name] is a 73 y.o. male with medical history significant of paroxysmal atrial fibrillation not on anticoagulation, rheumatoid arthritis, HTN, HLD, prediabetes who presented to [facility] on 03/15/2024 with palpitations, progressive shortness of breath, generalized weakness ongoing for the past week.  Denies any sick contacts or changes in medication.  Has been compliant with his home antihypertensives.  Denies headache, no dizziness, no chest pain, no fever/chills/night sweats, no nausea/vomiting/diarrhea, no cough/congestion, no urinary symptoms, no focal weakness, no fatigue, no paresthesias.  In the ED, temperature 98.1 F, HR 145, RR 19, BP 130/107, SpO2 94% on room air.  WBC 7.5, hemoglobin 15.3, platelet count 291.  Sodium 139, potassium 4.3, chloride 105, CO2 23, glucose 95, BUN 24, creat 1.03.  High-sensitivity troponin 7.  BNP 387.4.  D-dimer elevated 5.10.  Influenza/RSV/COVID PCR negative.  Chest x-ray with no active cardiopulmonary disease process.  CT angiogram chest with bilateral pulmonary emboli, within upper/lower lobe segmental and subsegmental branches, findings concerning for right heart failure, stable 1.7 x 1.6 cm pulmonary nodule right lower lobe, aortic atherosclerosis.  Patient was started on a Cardizem drip and heparin drip.  PCCM consulted.  TRH consulted for admission for A-fib with RVR, acute pulmonary embolism.  Patient was transferred to [facility] for further evaluation and management.   Review of Systems: As mentioned in the history of present illness. All other systems reviewed and are negative. 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 He has been exposed to tobacco smoke. He has never used smokeless tobacco. He reports that he does not drink alcohol and does not use drugs.  Allergies  Allergen Reactions   Vicodin [Hydrocodone-Acetaminophen] Nausea And Vomiting    Family History  Problem Relation Age of Onset   Hypertension Brother    Prostate cancer Brother    Prostate cancer Paternal Grandfather    Healthy Son  Depression Other    Hypertension Other    GI problems Other    Lung disease Other    Colon cancer Neg Hx    Esophageal cancer Neg Hx    Ulcerative colitis Neg Hx     Prior to Admission medications   Medication Sig Start Date End Date Taking? Authorizing Provider  acyclovir (ZOVIRAX) 800 MG tablet Take 1 tablet (800 mg total) by mouth 3 (three) times daily as needed. For outbreaks  11/25/22   Ardith Dark, MD  Ascorbic Acid (VITAMIN C PO) Take 1 tablet by mouth daily.    [provider]  aspirin 81 MG chewable tablet Chew by mouth daily.    [provider]  Cholecalciferol (VITAMIN D3 PO) Take by mouth.    [provider]  Coenzyme Q10 (CO Q 10 PO) Take by mouth daily.    [provider]  folic acid (FOLVITE) 1 MG tablet Take 2 tablets (2 mg total) by mouth daily. 08/04/23   Gearldine Bienenstock, PA-C  methotrexate (RHEUMATREX) 2.5 MG tablet TAKE 8 TABLETS (20 MG TOTAL) BY MOUTH ONCE A WEEK. CAUTION:CHEMOTHERAPY. PROTECT FROM LIGHT. 01/22/24   Gearldine Bienenstock, PA-C  Multiple Vitamin (MULTIVITAMIN) tablet Take 1 tablet by mouth daily.    [provider]  Probiotic Product (PROBIOTIC PO) Take by mouth daily.    [provider]  verapamil (CALAN-SR) 180 MG CR tablet Take 1 tablet (180 mg total) by mouth daily. 02/23/24   Ardith Dark, MD    Physical Exam: Vitals:   03/15/24 1235 03/15/24 1305 03/15/24 1310 03/15/24 1349  BP:  (!) 147/95 (!) 127/94 (!) 151/87  Pulse: (!) 106 (!) 46 93 66  Resp: 18 20 (!) 24 20  Temp:    97.8 F (36.6 C)  TempSrc:    Oral  SpO2: 95% 96% 96% 97%  Weight:      Height:    (P) 6' (1.829 m)    GEN: 73 yo male in NAD, alert and oriented x 3, wd/wn HEENT: NCAT, PERRL, EOMI, sclera clear, MMM PULM: CTAB w/o wheezes/crackles, normal respiratory effort, on 2 L nasal cannula with SpO2 97% at rest CV: Tachycardic, irregularly irregular rhythm w/o M/G/R GI: abd soft, NTND, NABS, no R/G/M MSK: no peripheral edema, muscle strength globally intact 5/5 bilateral upper/lower extremities NEURO: CN II-XII intact, no focal deficits, sensation to light touch intact PSYCH: normal mood/affect Integumentary: dry/intact, no rashes or wounds   Assessment and Plan:   Acute bilateral segmental/subsegmental pulmonary embolism Patient presenting with progressive shortness of breath, palpitations,  generalized weakness over the last week.  Has history of paroxysmal atrial fibrillation not on anticoagulation outpatient.  Denies any recent travel, no injuries, no recent surgeries.  No family history of hypercoagulable state.  CT angiogram chest notable for bilateral pulmonary emboli, upper/lower lobe segmental/subsegmental with concern for right heart failure.  Suspect etiology likely from underlying atrial fibrillation.  Seen by PCCM. -- Continue heparin drip, pharmacy consulted for dosing/monitoring -- TTE: Pending -- Vascular duplex ultrasound: Pending -- If no significant findings on TTE, anticipate transition to DOAC in the next 24-48 hours -- Pharmacy consultation for benefit check regarding Eliquis/Xarelto. -- Continue to monitor on telemetry  Paroxysmal atrial fibrillation with RVR Essential hypertension On verapamil 180 mg p.o. daily at baseline. -- Started on Cardizem drip, goal HR 65-1 05 -- Hold home verapamil for now, did take a.m. dose this morning -- TTE: Pending -- Check TSH -- Monitor on telemetry --  Referral to outpatient A-fib clinic  Elevated BNP BNP elevated 387.4, in the setting of atrial fibrillation RVR, acute pulmonary embolism. -- TTE: Pending  Dyslipidemia Not on statin outpatient.  Lipid panel in AM.  Pulmonary nodule CT angiogram chest notable for stable 1.7 x 1.6 cm right lower lobe nodule, likely benign.  Outpatient follow-up with PCP.  Rheumatoid arthritis On methotrexate outpatient.  Follows with rheumatology.     Advance Care Planning:   Code Status: Full Code   Consults: PCCM  Family Communication: Updated spouse present at bedside  Severity of Illness: The appropriate patient status for this patient is OBSERVATION. Observation status is judged to be reasonable and necessary in order to provide the required intensity of service to ensure the patient's safety. The patient's presenting symptoms, physical exam findings, and initial  radiographic and laboratory data in the context of their medical condition is felt to place them at decreased risk for further clinical deterioration. Furthermore, it is anticipated that the patient will be medically stable for discharge from the hospital within 2 midnights of admission.   Author: Alvira Philips [place], DO 03/15/2024 2:31 PM  For on call review [email or web address].

## 2024-03-15 NOTE — ED Notes (Signed)
 Thomas with cl called for transport

## 2024-03-15 NOTE — Telephone Encounter (Signed)
 Noted, patient present at ED

## 2024-03-15 NOTE — Progress Notes (Signed)
  Echocardiogram 2D Echocardiogram has been performed.  Ocie Doyne RDCS 03/15/2024, 3:50 PM

## 2024-03-16 ENCOUNTER — Observation Stay (HOSPITAL_BASED_OUTPATIENT_CLINIC_OR_DEPARTMENT_OTHER)

## 2024-03-16 DIAGNOSIS — I2699 Other pulmonary embolism without acute cor pulmonale: Secondary | ICD-10-CM

## 2024-03-16 DIAGNOSIS — I5021 Acute systolic (congestive) heart failure: Secondary | ICD-10-CM

## 2024-03-16 DIAGNOSIS — Z86711 Personal history of pulmonary embolism: Secondary | ICD-10-CM

## 2024-03-16 DIAGNOSIS — I480 Paroxysmal atrial fibrillation: Secondary | ICD-10-CM | POA: Diagnosis not present

## 2024-03-16 LAB — LIPID PANEL
Cholesterol: 140 mg/dL (ref 0–200)
HDL: 49 mg/dL (ref >40)
LDL Cholesterol: 82 mg/dL (ref 0–99)
Total CHOL/HDL Ratio: 2.9 ratio
Triglycerides: 43 mg/dL (ref <150)
VLDL: 9 mg/dL (ref 0–40)

## 2024-03-16 LAB — BASIC METABOLIC PANEL WITH GFR
Anion gap: 6 (ref 5–15)
BUN: 21 mg/dL (ref 8–23)
CO2: 26 mmol/L (ref 22–32)
Calcium: 8.6 mg/dL — ABNORMAL LOW (ref 8.9–10.3)
Chloride: 107 mmol/L (ref 98–111)
Creatinine, Ser: 1.05 mg/dL (ref 0.61–1.24)
GFR, Estimated: >60 mL/min (ref >60)
Glucose, Bld: 93 mg/dL (ref 70–99)
Potassium: 4 mmol/L (ref 3.5–5.1)
Sodium: 139 mmol/L (ref 135–145)

## 2024-03-16 LAB — CBC
HCT: 38.7 % — ABNORMAL LOW (ref 39.0–52.0)
Hemoglobin: 12.9 g/dL — ABNORMAL LOW (ref 13.0–17.0)
MCH: 35.1 pg — ABNORMAL HIGH (ref 26.0–34.0)
MCHC: 33.3 g/dL (ref 30.0–36.0)
MCV: 105.2 fL — ABNORMAL HIGH (ref 80.0–100.0)
Platelets: 271 10*3/uL (ref 150–400)
RBC: 3.68 MIL/uL — ABNORMAL LOW (ref 4.22–5.81)
RDW: 14.6 % (ref 11.5–15.5)
WBC: 5.8 10*3/uL (ref 4.0–10.5)
nRBC: 0 % (ref 0.0–0.2)

## 2024-03-16 LAB — HEPARIN LEVEL (UNFRACTIONATED): Heparin Unfractionated: 0.47 [IU]/mL (ref 0.30–0.70)

## 2024-03-16 LAB — HEMOGLOBIN A1C
Hgb A1c MFr Bld: 5.5 % (ref 4.8–5.6)
Mean Plasma Glucose: 111.15 mg/dL

## 2024-03-16 LAB — MAGNESIUM: Magnesium: 2.2 mg/dL (ref 1.7–2.4)

## 2024-03-16 LAB — TSH: TSH: 4.45 u[IU]/mL (ref 0.350–4.500)

## 2024-03-16 MED ORDER — METOPROLOL TARTRATE 25 MG PO TABS
25.0000 mg | ORAL_TABLET | Freq: Two times a day (BID) | ORAL | Status: DC
  Administered 2024-03-16 – 2024-03-17 (×3): 25 mg via ORAL
  Filled 2024-03-16 (×3): qty 1

## 2024-03-16 MED ORDER — SACUBITRIL-VALSARTAN 24-26 MG PO TABS
1.0000 | ORAL_TABLET | Freq: Two times a day (BID) | ORAL | Status: DC
  Administered 2024-03-16 – 2024-03-17 (×3): 1 via ORAL
  Filled 2024-03-16 (×5): qty 1

## 2024-03-16 MED ORDER — FUROSEMIDE 10 MG/ML IJ SOLN
40.0000 mg | Freq: Once | INTRAMUSCULAR | Status: AC
  Administered 2024-03-16: 40 mg via INTRAVENOUS
  Filled 2024-03-16: qty 4

## 2024-03-16 NOTE — Care Management Obs Status (Signed)
 MEDICARE OBSERVATION STATUS NOTIFICATION   Patient Details  Name: Max [name] MRN: [id number] Date of Birth: Oct 04, 1951   Medicare Observation Status Notification Given:  Yes    Ewing Schlein, LCSW 03/16/2024, 1:41 PM

## 2024-03-16 NOTE — Progress Notes (Signed)
   03/16/24 0911  TOC Brief Assessment  Insurance and Status Reviewed  Patient has primary care physician Yes  Home environment has been reviewed Resides in single family home with spouse  Prior level of function: Independent with ADLs at baseline  Prior/Current Home Services No current home services  Social Drivers of Health Review SDOH reviewed no interventions necessary  Readmission risk has been reviewed Yes  Transition of care needs no transition of care needs at this time

## 2024-03-16 NOTE — Hospital Course (Addendum)
 73 y.o. male with medical history significant of PAF not on AC, RAs, HTN, HLD, prediabetes who presented to [facility] on 03/15/2024 with palpitations, progressive shortness of breath, generalized weakness ongoing for the past week and workup revealed BNP 387 troponin 7 D-dimer elevated 5.1 chest x-ray unremarkable CT angio chest bilateral PE upper/lower lobe segmental and subsegmental branches and concerning for right heart failure and also A-fib with RVR, PCCM was consulted and admitted Patient did not need advanced therapy, managed with heparin, echo showed low EF cardiology consulted GDMT adjusted as able.  Eliquis initiated heparin discontinued overall doing well.  Once cleared by cardiology and patient does okay on ambulation he will be discharged home  Consultation: PCCM Cardiology  Procedures/testing: CTA> Bilateral pulmonary emboli, within the upper and lower lobe segmental and subsegmental branches. Findings concerning for right heart failure.  Stable 1.7 x 1.6 cm pulmonary nodule in the right lower lobe, previously 1.7 x 1.5 cm, essentially unchanged dating back to September 2018, most compatible with a benign etiology. Echocardiogram: EF 30-35%  Subjective: Seen and examined this morning Resting comfortably on the bedside chair Overnight afebrile, BP stable  Labs reviewed stable CBC With 205 lb  on 41 recheck pending  Discharge diagnoses :  Acute Bilateral pulm embolism Acute hypoxemic respiratory failure initially needing supplemental oxygen: Patient with dyspnea on exertion symptomatic ffor ew days before admission. No significant stress in the right heart per CT, vitals stable seen by PCCM -no indication for advanced therapies recommending anticoagulation x 3 months May need lifelong for A-fib.  Being managed with heparin drip. Echo shows EF 30-35% no RWMA, G1 DD, IV septum flattens systolic consistent with right ventricular pressure overload and some RV dysfunction  moderately elevated PASP> transitioned patient to Eliquis-benefit checked will provide with Eliquis coupon for 30 days and paperwork will be filled for patient assistance by cardiology.  PAF with RVR Essential hypertension: PTA on verapamil-admitted Cardizem drip for A-fib RVR- and stopped 2/2 low ef, now on metoprolol heart rate stable.  Changed to Eliquis this morning overall doing well.  Monitor heart rate on ambulation today. Last TSH normal 3/11-planning for rate control strategy with plan for outpatient DCCV in 3 weeks.  New onset acute HFrEF  EF 30 to 35%-Likely multifactorial in setting of A-fib RVR and bilateral PE.  Mild volume overload on exam-4 is received Lasix- currently off. GDMT being adjusted by cardiology, on Entresto, Lopressor. Continue to monitor intake output Daily weight-  Dyslipidemia: LDL stable.  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: Followed by rheumatology on methotrexate as monotherapy  Stable pulmonary nodule: Follow-up outpatient

## 2024-03-16 NOTE — Progress Notes (Signed)
 PROGRESS NOTE Max [name]  ZOX:[id number] DOB: [birth year] DOA: 03/15/2024 PCP: Ardith Dark, MD  Brief Narrative/Hospital Course: 72 y.o. male with medical history significant of PAF not on AC, RAs, HTN, HLD, prediabetes who presented [facility] on 03/15/2024 with palpitations, progressive shortness of breath, generalized weakness ongoing for the past week and workup revealed BNP 387 troponin 7 D-dimer elevated 5.1 chest x-ray unremarkable CT angio chest bilateral PE upper/lower lobe segmental and subsegmental branches and concerning for right heart failure and also A-fib with RVR, PCCM was consulted and admitted  Consultation: PCCM  Procedures/testing: CTA> Bilateral pulmonary emboli, within the upper and lower lobe segmental and subsegmental branches. Findings concerning for right heart failure.  Stable 1.7 x 1.6 cm pulmonary nodule in the right lower lobe, previously 1.7 x 1.5 cm, essentially unchanged dating back to September 2018, most compatible with a benign etiology.  Subjective: Seen and examined this morning On the bedside chair, alert oriented Wife on the phone- Overnight afebrile BP stable 12/15/2008, saturating 92% room air initially on 1 to 2 L  Stable CBC and BMP LDL 82 TSH 1.0  Assessment and plan:  Acute Bilateral pulm embolism Acute hypoxemic respiratory failure initially needing supplemental oxygen: Patient with dyspnea on exertion symptomatic few days before admission. No significant stress in the right heart per CT, vitals stable seen by PCCM.  No indication for advanced therapies thrombectomy/catheter directed intervention, recommending anticoagulation x 3 months May need lifelong for A-fib.  Being managed with heparin drip. Echo shows EF 30-35% no RWMA, G1 DD, IV septum flattens systolic consistent with right ventricular pressure overload and some RV dysfunction moderately elevated PASP.  PAF with RVR Essential hypertension: PTA on verapamil to  monitor daily.  Admitted Cardizem drip for A-fib RVR-heart rate poorly controlled.Last TSH normal 3/11 Given low EF ?  If he needs TEE cardioversion versus amiodarone.  Acute systolic dysfunction EF 30 to 81% Elevated BNP: Likely multifactorial in setting of A-fib RVR and bilateral PE.  Will consult cardiology.  Currently on Cardizem  gtt will try to avoid negative inotropes-d/w cardio.  Dyslipidemia: LDL stable.  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 Patient status is: Remains hospitalized because of severity of illness Level of care: Progressive   Dispo: The patient is from: home            Anticipated disposition: TBD  Objective: Vitals last 24 hrs: Vitals:   03/16/24 0000 03/16/24 0200 03/16/24 0737 03/16/24 0926  BP: 112/77 113/81  121/84  Pulse: 90 78 89 100  Resp: 19 20 19 15   Temp:  97.9 F (36.6 C)  97.9 F (36.6 C)  TempSrc:  Oral  Oral  SpO2: 96% 96% 92% 94%  Weight:      Height:       Weight change:   Physical Examination: General exam: alert awake, older than stated age HEENT:Oral mucosa moist, Ear/Nose WNL grossly Respiratory system: Bilaterally clear BS, no use of accessory muscle Cardiovascular system: S1 & S2 +. Gastrointestinal system: Abdomen soft, NT,ND,BS+ Nervous System: Alert, awake,following commands. Extremities: LE edema neg, moving arms, warm legs Skin: No rashes,warm. MSK: Normal muscle bulk/tone.   Medications reviewed:  Scheduled Meds: Continuous Infusions:  diltiazem (CARDIZEM) infusion 5 mg/hr (03/15/24 2320)   heparin  1,300 Units/hr (03/15/24 2328)   lactated ringers Stopped (03/15/24 1120)    Diet Order             Diet Heart Room service appropriate? Yes; Fluid consistency:  Thin  Diet effective now                  Intake/Output Summary (Last 24 hours) at 03/16/2024 1131 Last data filed at 03/16/2024 0500 Gross per 24 hour  Intake 407.14 ml  Output 550 ml  Net -142.86 ml   Net IO Since Admission: -142.86 mL [03/16/24 1131]  Wt Readings from Last 3 Encounters:  03/15/24 93 kg  03/15/24 95.3 kg  02/23/24 95.5 kg     Unresulted Labs (From admission, onward)     Start     Ordered   03/17/24 0500  Heparin level (unfractionated)  Daily,   R     Question:  Specimen collection method  Answer:  Lab=Lab collect   03/15/24 2036   03/16/24 0500  CBC  Daily,   R      03/15/24 1410          Data Reviewed: I have personally reviewed following labs and imaging studies ( see epic result tab) CBC: Recent Labs  Lab 03/15/24 0913 03/16/24 0358  WBC 7.5 5.8  HGB 15.3 12.9*  HCT 44.4 38.7*  MCV 101.8* 105.2*  PLT 291 271   CMP: Recent Labs  Lab 03/15/24 0913 03/16/24 0358  NA 139 139  K 4.3 4.0  CL 105 107  CO2 23 26  GLUCOSE 95 93  BUN 24* 21  CREATININE 1.03 1.05  CALCIUM 9.4 8.6*   GFR: Estimated Creatinine Clearance: 68.8 mL/min (by C-G formula based on SCr of 1.05 mg/dL). Recent Labs    03/16/24 0358  HGBA1C 5.5   No results for input(s): "GLUCAP" in the last 168 hours. Recent Labs    03/16/24 0358  CHOL 140  HDL 49  LDLCALC 82  TRIG 43  CHOLHDL 2.9   No results for input(s): "TSH", "T4TOTAL", "FREET4", "T3FREE", "THYROIDAB" in the last 72 hours. Sepsis Labs: Recent Labs  Lab 03/15/24 1313  LATICACIDVEN 1.0   Recent Results (from the past 240 hours)  Resp panel by RT-PCR (RSV, Flu A&B, Covid) Anterior Nasal Swab     Status: None   Collection Time: 03/15/24  9:40 AM   Specimen: Anterior Nasal Swab  Result Value Ref Range Status   SARS Coronavirus 2 by RT PCR NEGATIVE NEGATIVE Final    Comment: (NOTE) SARS-CoV-2 target nucleic acids are NOT DETECTED.  The SARS-CoV-2 RNA is generally detectable in upper  respiratory specimens during the acute phase of infection. The lowest concentration of SARS-CoV-2 viral copies this assay can detect is 138 copies/mL. A negative result does not preclude SARS-Cov-2 infection and should not be used as the sole basis for treatment or other patient management decisions. A negative result may occur with  improper specimen collection/handling, submission of specimen other than nasopharyngeal swab, presence of viral mutation(s) within the areas targeted by this assay, and inadequate number of viral copies(<138 copies/mL). A negative result must be combined with clinical observations, patient history, and epidemiological information. The expected result is Negative.  Fact Sheet for Patients:  BloggerCourse.com  Fact Sheet for Healthcare Providers:  SeriousBroker.it  This test is no t yet approved or cleared by the [place] FDA and  has been authorized for detection and/or diagnosis of SARS-CoV-2 by FDA under an Emergency Use Authorization (EUA).  This EUA will remain  in effect (meaning this test can be used) for the duration of the COVID-19 declaration under Section 564(b)(1) of the Act, 21 U.S.C.section 360bbb-3(b)(1), unless the authorization is terminated  or revoked sooner.       Influenza A by PCR NEGATIVE NEGATIVE Final   Influenza B by PCR NEGATIVE NEGATIVE Final    Comment: (NOTE) The Xpert Xpress SARS-CoV-2/FLU/RSV plus assay is intended as an aid in the diagnosis of influenza from Nasopharyngeal swab specimens and should not be used as a sole basis for treatment. Nasal washings and aspirates are unacceptable for Xpert Xpress SARS-CoV-2/FLU/RSV testing.  Fact Sheet for Patients: BloggerCourse.com  Fact Sheet for Healthcare Providers: SeriousBroker.it  This test is not yet approved or cleared by the [place] FDA and has been  authorized for detection and/or diagnosis of SARS-CoV-2 by FDA under an Emergency Use Authorization (EUA). This EUA will remain in effect (meaning this test can be used) for the duration of the COVID-19 declaration under Section 564(b)(1) of the Act, 21 U.S.C. section 360bbb-3(b)(1), unless the authorization is terminated or revoked.     Resp Syncytial Virus by PCR NEGATIVE NEGATIVE Final    Comment: (NOTE) Fact Sheet for Patients: BloggerCourse.com  Fact Sheet for Healthcare Providers: SeriousBroker.it  This test is not yet approved or cleared by the [place] FDA and has been authorized for detection and/or diagnosis of SARS-CoV-2 by FDA under an Emergency Use Authorization (EUA). This EUA will remain in effect (meaning this test can be used) for the duration of the COVID-19 declaration under Section 564(b)(1) of the Act, 21 U.S.C. section 360bbb-3(b)(1), unless the authorization is terminated or revoked.  Performed at [facility], [address], [place], [place] [address]    Antimicrobials/Microbiology: Anti-infectives (From admission, onward)    None         Component Value Date/Time   SDES  12/11/2019 1352    BLOOD RIGHT ANTECUBITAL Performed at [facility], [address], [place], [place] [address]    SDES  12/11/2019 1352    BLOOD LEFT HAND Performed at [facility], [address], [place], [place] [address]    SPECREQUEST  12/11/2019 1352    BOTTLES DRAWN AEROBIC AND ANAEROBIC Blood Culture adequate volume Performed at [facility], [address] [place], [place] [address]    SPECREQUEST  12/11/2019 1352    BOTTLES DRAWN AEROBIC AND ANAEROBIC Blood Culture adequate volume Performed at [facility], [address] [place], [place] [address]    CULT  12/11/2019 1352    NO GROWTH 5 DAYS Performed at  [facility], [address], [place], [place] [address]    CULT PROPIONIBACTERIUM ACNES (A) 12/11/2019 1352   REPTSTATUS 12/16/2019 FINAL 12/11/2019 1352   REPTSTATUS 12/18/2019 FINAL 12/11/2019 1352     Radiology Studies: ECHOCARDIOGRAM COMPLETE Result Date: 03/15/2024    ECHOCARDIOGRAM REPORT   Patient Name:   GIRARD KOONTZ [facility] Date of Exam: 03/15/2024 Medical Rec #:  [id number]        Height:       72.0 in Accession #:    [id number]       Weight:       205.0 lb Date of Birth:  03-Feb-1951        BSA:          2.153 m Patient Age:    72 years         BP:  151/87 mmHg Patient Gender: M                HR:           98 bpm. Exam Location:  Inpatient Procedure: 2D Echo, Cardiac Doppler and Color Doppler (Both Spectral and Color            Flow Doppler were utilized during procedure). Indications:    Pulmonary embolus  History:        Patient has prior history of Echocardiogram examinations, most                 recent 07/15/2012. Arrythmias:Atrial Fibrillation,                 Signs/Symptoms:Dyspnea and Syncope; Risk Factors:Hypertension.  Sonographer:    Vern Claude Referring Phys: [id number] ERIC J [place] IMPRESSIONS  1. Left ventricular ejection fraction, by estimation, is 30 to 35%. The left ventricle has moderately decreased function. The left ventricle has no regional wall motion abnormalities. Left ventricular diastolic parameters are consistent with Grade I diastolic dysfunction (impaired relaxation). There is the interventricular septum is flattened in systole, consistent with right ventricular pressure overload.  2. Reduced pulmonary acceleration time consistent with RV dysfunction. Right ventricular systolic function is mildly reduced. The right ventricular size is moderately enlarged. There is moderately elevated pulmonary artery systolic pressure.  3. Left atrial size was moderately dilated.  4. Right atrial size was mild to moderately dilated.  5. The mitral valve is normal in structure.  Mild mitral valve regurgitation. No evidence of mitral stenosis.  6. The aortic valve is normal in structure. Aortic valve regurgitation is not visualized. No aortic stenosis is present.  7. The inferior vena cava is dilated in size with <50% respiratory variability, suggesting right atrial pressure of 15 mmHg. FINDINGS  Left Ventricle: Left ventricular ejection fraction, by estimation, is 30 to 35%. The left ventricle has moderately decreased function. The left ventricle has no regional wall motion abnormalities. The left ventricular internal cavity size was normal in size. There is no left ventricular hypertrophy. The interventricular septum is flattened in systole, consistent with right ventricular pressure overload. Left ventricular diastolic parameters are consistent with Grade I diastolic dysfunction (impaired relaxation). Right Ventricle: Reduced pulmonary acceleration time consistent with RV dysfunction. The right ventricular size is moderately enlarged. No increase in right ventricular wall thickness. Right ventricular systolic function is mildly reduced. There is moderately elevated pulmonary artery systolic pressure. The tricuspid regurgitant velocity is 3.31 m/s, and with an assumed right atrial pressure of 15 mmHg, the estimated right ventricular systolic pressure is 58.8 mmHg. Left Atrium: Left atrial size was moderately dilated. Right Atrium: Right atrial size was mild to moderately dilated. Pericardium: There is no evidence of pericardial effusion. Mitral Valve: The mitral valve is normal in structure. Mild mitral valve regurgitation. No evidence of mitral valve stenosis. MV peak gradient, 4.6 mmHg. The mean mitral valve gradient is 2.0 mmHg. Tricuspid Valve: The tricuspid valve is normal in structure. Tricuspid valve regurgitation is not demonstrated. No evidence of tricuspid stenosis. Aortic Valve: The aortic valve is normal in structure. Aortic valve regurgitation is not visualized. No aortic  stenosis is present. Aortic valve mean gradient measures 1.7 mmHg. Aortic valve peak gradient measures 3.3 mmHg. Aortic valve area, by VTI measures 3.03 cm. Pulmonic Valve: The pulmonic valve was normal in structure. Pulmonic valve regurgitation is not visualized. No evidence of pulmonic stenosis. Aorta: The aortic root is normal in size and structure. Venous: The  inferior vena cava is dilated in size with less than 50% respiratory variability, suggesting right atrial pressure of 15 mmHg. IAS/Shunts: No atrial level shunt detected by color flow Doppler.  LEFT VENTRICLE PLAX 2D LVIDd:         5.40 cm LVIDs:         4.00 cm LV PW:         0.70 cm LV IVS:        0.90 cm LVOT diam:     1.90 cm LV SV:         43 LV SV Index:   20 LVOT Area:     2.84 cm  LV Volumes (MOD) LV vol d, MOD A2C: 181.0 ml LV vol d, MOD A4C: 167.0 ml LV vol s, MOD A2C: 63.8 ml LV vol s, MOD A4C: 71.9 ml LV SV MOD A2C:     117.2 ml LV SV MOD A4C:     167.0 ml LV SV MOD BP:      112.6 ml RIGHT VENTRICLE             IVC RV Basal diam:  4.80 cm     IVC diam: 2.10 cm RV Mid diam:    3.50 cm RV S prime:     11.70 cm/s TAPSE (M-mode): 2.0 cm LEFT ATRIUM              Index        RIGHT ATRIUM           Index LA diam:        3.50 cm  1.63 cm/m   RA Area:     25.70 cm LA Vol (A2C):   72.8 ml  33.81 ml/m  RA Volume:   88.30 ml  41.01 ml/m LA Vol (A4C):   105.0 ml 48.77 ml/m LA Biplane Vol: 87.3 ml  40.55 ml/m  AORTIC VALVE                    PULMONIC VALVE AV Area (Vmax):    2.66 cm     PV Vmax:          0.59 m/s AV Area (Vmean):   1.91 cm     PV Peak grad:     1.4 mmHg AV Area (VTI):     3.03 cm     PR End Diast Vel: 2.94 msec AV Vmax:           90.17 cm/s AV Vmean:          62.633 cm/s AV VTI:            0.142 m AV Peak Grad:      3.3 mmHg AV Mean Grad:      1.7 mmHg LVOT Vmax:         84.50 cm/s LVOT Vmean:        42.300 cm/s LVOT VTI:          0.152 m LVOT/AV VTI ratio: 1.07  AORTA Ao Root diam: 3.40 cm Ao Asc diam:  3.00 cm MITRAL VALVE                  TRICUSPID VALVE MV Area (PHT): 4.12 cm      TR Peak grad:   43.8 mmHg MV Area VTI:   2.35 cm      TR Vmax:        331.00 cm/s MV Peak grad:  4.6 mmHg MV Mean grad:  2.0 mmHg  SHUNTS MV Vmax:       1.07 m/s      Systemic VTI:  0.15 m MV Vmean:      64.8 cm/s     Systemic Diam: 1.90 cm MV Decel Time: 184 msec MR Peak grad:   90.8 mmHg MR Mean grad:   57.0 mmHg MR Vmax:        476.50 cm/s MR Vmean:       351.5 cm/s MR PISA:        1.01 cm MR PISA Radius: 0.40 cm MV E velocity: 82.50 cm/s Clearnce Hasten Electronically signed by Clearnce Hasten Signature Date/Time: 03/15/2024/4:08:17 PM    Final    CT Angio Chest PE W and/or Wo Contrast Result Date: 03/15/2024 CLINICAL DATA:  Pulmonary embolism (PE) suspected, low to intermediate prob, positive D-dimer EXAM: CT ANGIOGRAPHY CHEST WITH CONTRAST TECHNIQUE: Multidetector CT imaging of the chest was performed using the standard protocol during bolus administration of intravenous contrast. Multiplanar CT image reconstructions and MIPs were obtained to evaluate the vascular anatomy. RADIATION DOSE REDUCTION: This exam was performed according to the departmental dose-optimization program which includes automated exposure control, adjustment of the mA and/or kV according to patient size and/or use of iterative reconstruction technique. CONTRAST:  OMNIPAQUE IOHEXOL 350 MG/ML SOLN COMPARISON:  CT chest dated 08/20/2018. FINDINGS: Cardiovascular: Satisfactory opacification of the pulmonary arteries to the segmental level. Bilateral pulmonary emboli within the proximal segmental and subsegmental right upper lobe and interlobar pulmonary arteries as well as the left upper and lower lobe segmental and subsegmental branches. RV/LV ratio is equal to or slightly greater than 1 with flattening of the intraventricular septum, concerning for right heart strain. No pericardial effusion. Thoracic aorta is normal in caliber with atherosclerotic calcification.  Mediastinum/Nodes: No enlarged mediastinal, hilar, or axillary lymph nodes. Thyroid gland, trachea, and esophagus demonstrate no significant findings. Lungs/Pleura: No pleural effusion or pneumothorax. No focal consolidation. Solid stable 1.7 x 1.6 cm pulmonary nodule in the right lower lobe, previously 1.7 x 1.5 cm, essentially unchanged dating back to September 2018, most compatible with a benign etiology. Small calcified right upper lobe granuloma. Linear parenchymal bands at the lung bases likely reflect atelectasis/scarring. Upper Abdomen: No acute abnormality. Musculoskeletal: No acute osseous abnormality. No suspicious osseous lesion. Multilevel degenerative changes of the thoracic spine. Review of the MIP images confirms the above findings. IMPRESSION: 1. Bilateral pulmonary emboli, within the upper and lower lobe segmental and subsegmental branches. Findings concerning for right heart failure. 2. Stable 1.7 x 1.6 cm pulmonary nodule in the right lower lobe, previously 1.7 x 1.5 cm, essentially unchanged dating back to September 2018, most compatible with a benign etiology. 3.  Aortic Atherosclerosis (ICD10-I70.0). These results were called by telephone at the time of interpretation on 03/15/2024 at 1:24 pm to provider [facility] , who verbally acknowledged these results. Electronically Signed   By: Hart Robinsons M.D.   On: 03/15/2024 13:36   DG Chest Port 1 View Result Date: 03/15/2024 CLINICAL DATA:  5107 Atrial fibrillation (HCC) 5107 EXAM: PORTABLE CHEST 1 VIEW COMPARISON:  12/11/2019. FINDINGS: Linear area of atelectasis/scarring noted at the lung bases. Bilateral lung fields are otherwise clear. No dense consolidation, lung collapse or pulmonary edema. Bilateral costophrenic angles are clear. Normal cardio-mediastinal silhouette. No acute osseous abnormalities. The soft tissues are within normal limits. IMPRESSION: No active disease. Electronically Signed   By: Jules Schick M.D.   On:  03/15/2024 10:50     LOS: 0 days  Total time spent in review of labs and imaging, patient evaluation, formulation of plan, documentation and communication with patient/family: 50 minutes  Lanae Boast, MD [facility] 03/16/2024, 11:31 AM

## 2024-03-16 NOTE — Progress Notes (Signed)
 BLE venous duplex has been completed.   Results can be found under chart review under CV PROC. 03/16/2024 1:52 PM Shem Plemmons RVT, RDMS

## 2024-03-16 NOTE — Consult Note (Addendum)
 Cardiology Consultation   Patient ID: Max [name] MRN: [id number]; DOB: [birth year]  Admit date: 03/15/2024 Date of Consult: 03/16/2024  PCP:  Ardith Dark, MD   [facility] Providers Cardiologist:  New (Dr. Jacques Navy) Click here to update MD or APP on Care Team, Refresh:1}     Patient Profile:   Max [name] is a 73 y.o. 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 Jonathon Bellows.  History of Present Illness:   Max [name] is a 73 year old male with the above history. He was previously seen by Cardiology in 07/2012 during and admission for atypical chest pain, syncope, and new onset atrial fibrillation. Syncope was felt to be vasovagal mediated. Echo showed LVEF of 65-70% with grade 2 diastolic dysfunction and mild MR. Myoview showed no reversible ischemia. He was continued on home Verapamil and started on full dose Aspirin. He was not started on anticoagulation given CHA2DS2-VASc score of 1 (HTN). He has not been seen by Cardiology since that admission.  Patient presented to the [facility] ED on 03/15/2024 from his Rheumatologist's office for further evaluation of tachycardia. EKG showed atrial fibrillation, rate 141 bpm, with non-specific T wave changes. High-sensitivity troponin negative x2. BNP mildly elevated at 387. D-dimer elevated at 5.1. Chest CTA showed bilateral PE within the upper and lower segmental and subsegmental branch with findings concerning for right heart failure. WBC 7.5, Hgb 15.3, Plts 291. Na 139, K 4.3, Glucose 95, BUN 24, Cr 1.03. Respiratory panel negative for COVID, influenza, and RSV. Lactic acid 1.0. Patient was started  on IV Heparin and IV Cardizem and admitted for further evaluation. Echo showed LVEF of 30-35% with no regional wall motion abnormalities and grade 1 diastolic dysfunction as well as moderately enlarged RV with mildly reduced RV function, moderately elevated PASP, and flattened interventricular septum in systolic consistent with RV pressure overload. Cardiology consulted for further evaluation.  Patient states he saw his PCP on 02/23/2024 for annual physical and was doing great at that time. He started feeling poorly about 1 week ago with some fatigue/ weakness but family thought that was just because he was very busy at work. However, this seemed to get worse over the weekend (3/29 and 3/30). He reports 2 episodes of heart racing over the last 2 weeks that both occurred when walking up hill. The last episode occurred on 3/30 and he though he may pass out with this. Symptoms quickly resolved with rest. He  denies any lightheadedness, dizziness, or near syncope outside of this episode. No chest pain. He describes orthopnea on evening of 3/31 but no other significant shortness of breath. No recent fevers or illnesses. No GI symptoms. No recent surgeries or travel. No abnormal bleeding in urine or stools.   He is still in atrial fibrillation with rates mostly in the low 100s at this time. However, he is feeling much better.  Past Medical History:  Diagnosis Date   Abdominal hernia 11/28/2015   Allergic rhinitis    Anxiety    BPV (benign positional vertigo), right 05/11/2018   Cardiac arrhythmia    ED (erectile dysfunction)    Herpes    History of syncope  in the setting of anxiety per cardiology note   Hypertension    Kidney tumor (benign), right    sx 02/27/2015   Meningioma [facility])    had surgery    Paroxysmal atrial fibrillation (HCC) 07/15/2012   a) CHADSVASc score 1b) On full-dose ASA and Verapamil   RA (rheumatoid arthritis) (HCC)    Seizures (HCC) 05/14/2011   "first and only" secondary to  meningioma    Vasovagal syncope     Past Surgical History:  Procedure Laterality Date   BRAIN MENINGIOMA EXCISION  2012   CATARACT EXTRACTION W/ INTRAOCULAR LENS IMPLANT  ~ 2001   right   COLONOSCOPY  2008   EYE SURGERY  1964   right; "hit w/baseball; eye hemorrhaged"   FLEXIBLE SIGMOIDOSCOPY     REFRACTIVE SURGERY  ~ 2003   ROBOTIC ASSITED PARTIAL NEPHRECTOMY Right 02/29/2016   Procedure: XI ROBOTIC ASSITED PARTIAL NEPHRECTOMY;  Surgeon: Sebastian Ache, MD;  Location: [facility]S;  Service: Urology;  Laterality: Right;   NEEDS ULTRASOUND     Home Medications:  Prior to Admission medications   Medication Sig Start Date End Date Taking? Authorizing Provider  Ascorbic Acid (VITAMIN C) 1000 MG tablet Take 1,000 mg by mouth daily.   Yes [provider]  aspirin EC 81 MG tablet Take 81 mg by mouth at bedtime. Swallow whole.   Yes [provider]  Cholecalciferol (VITAMIN D3) 1000 units CAPS Take 1,000 Units by mouth at bedtime.   Yes [provider]  Coenzyme Q10 (CO Q 10 PO) Take 1 capsule by mouth daily after supper.   Yes [provider]  folic acid (FOLVITE) 1 MG tablet Take 2 tablets (2 mg total) by mouth daily. 08/04/23  Yes Gearldine Bienenstock, PA-C  methotrexate (RHEUMATREX) 2.5 MG tablet TAKE 8 TABLETS (20 MG TOTAL) BY MOUTH ONCE A WEEK. CAUTION:CHEMOTHERAPY. PROTECT FROM LIGHT. Patient taking differently: Take 20 mg by mouth every Sunday. Caution:Chemotherapy. Protect from light. 01/22/24  Yes Gearldine Bienenstock, PA-C  Multiple Vitamins-Minerals (CENTRUM SILVER 50+MEN) TABS Take 1 tablet by mouth daily with breakfast.   Yes [provider]  Probiotic Product (PROBIOTIC PO) Take 1 capsule by mouth in the morning.   Yes [provider]  verapamil (CALAN-SR) 180 MG CR tablet Take 1 tablet (180 mg total) by mouth daily. 02/23/24  Yes Ardith Dark, MD  acyclovir (ZOVIRAX) 800 MG tablet Take 1 tablet (800 mg total) by mouth 3 (three) times daily as  needed. For outbreaks Patient not taking: Reported on 03/15/2024 11/25/22   Ardith Dark, MD    Inpatient Medications: Scheduled Meds:  Continuous Infusions:  diltiazem (CARDIZEM) infusion 5 mg/hr (03/15/24 2320)   heparin 1,300 Units/hr (03/15/24 2328)   lactated ringers Stopped (03/15/24 1120)   PRN Meds: levalbuterol, ondansetron **OR** ondansetron (ZOFRAN) IV  Allergies:    Allergies  Allergen Reactions   Vicodin [Hydrocodone-Acetaminophen] Nausea And Vomiting    Social History:   Social History   Socioeconomic History   Marital status: Married    Spouse name: Not on file   Number of children: 1   Years of education: Not on file   Highest education level: Not on file  Occupational History    Comment: [employer]   Tobacco Use   Smoking status: Never    Passive exposure: Past   Smokeless tobacco: Never  Vaping Use   Vaping status: Never Used  Substance and Sexual Activity   Alcohol use: No  Drug use: No   Sexual activity: Yes  Other Topics Concern   Not on file  Social History Narrative   Regular Exercise   Social Drivers of Health   Financial Resource Strain: Low Risk  (01/05/2024)   Overall Financial Resource Strain (CARDIA)    Difficulty of Paying Living Expenses: Not hard at all  Food Insecurity: No Food Insecurity (03/15/2024)   Hunger Vital Sign    Worried About Running Out of Food in the Last Year: Never true    Ran Out of Food in the Last Year: Never true  Transportation Needs: No Transportation Needs (03/15/2024)   PRAPARE - Administrator, Civil Service (Medical): No    Lack of Transportation (Non-Medical): No  Physical Activity: Sufficiently Active (01/05/2024)   Exercise Vital Sign    Days of Exercise per Week: 5 days    Minutes of Exercise per Session: 120 min  Stress: No Stress Concern Present (01/05/2024)   [employer] of Occupational Health - Occupational Stress Questionnaire    Feeling of Stress : Not at all   Social Connections: Moderately Isolated (03/15/2024)   Social Connection and Isolation Panel [NHANES]    Frequency of Communication with Friends and Family: Twice a week    Frequency of Social Gatherings with Friends and Family: Once a week    Attends Religious Services: Never    Database administrator or Organizations: No    Attends Banker Meetings: Never    Marital Status: Married  Catering manager Violence: Not At Risk (03/15/2024)   Humiliation, Afraid, Rape, and Kick questionnaire    Fear of Current or Ex-Partner: No    Emotionally Abused: No    Physically Abused: No    Sexually Abused: No    Family History:    Family History  Problem Relation Age of Onset   Hypertension Brother    Prostate cancer Brother    Prostate cancer Paternal Grandfather    Healthy Son    Depression Other    Hypertension Other    GI problems Other    Lung disease Other    Colon cancer Neg Hx    Esophageal cancer Neg Hx    Ulcerative colitis Neg Hx      ROS:  Please see the history of present illness.     Physical Exam/Data:   Vitals:   03/16/24 0000 03/16/24 0200 03/16/24 0737 03/16/24 0926  BP: 112/77 113/81  121/84  Pulse: 90 78 89 100  Resp: 19 20 19 15   Temp:  97.9 F (36.6 C)  97.9 F (36.6 C)  TempSrc:  Oral  Oral  SpO2: 96% 96% 92% 94%  Weight:      Height:        Intake/Output Summary (Last 24 hours) at 03/16/2024 1156 Last data filed at 03/16/2024 0500 Gross per 24 hour  Intake 407.14 ml  Output 550 ml  Net -142.86 ml      03/15/2024    9:10 AM 03/15/2024    8:10 AM 02/23/2024    8:08 AM  Last 3 Weights  Weight (lbs) 205 lb 210 lb 210 lb 9.6 oz  Weight (kg) 92.987 kg 95.255 kg 95.528 kg     Body mass index is 27.8 kg/m (pended).  General: 73 y.o. Caucasian male resting comfortably in no acute distress. HEENT: Normocephalic and atraumatic. Sclera clear.  Neck: Supple. Positive hepatic jugular reflux with mild JVD. Heart: Mildly tachycardic with  irregularly irregular rhythm.  No  murmurs, gallops, or rubs.  Lungs: No increased work of breathing. Very faint crackles in bilateral bases.  Abdomen: Soft, non-distended, and non-tender to palpation.  Extremities: Trace lower extremity edema bilaterally.    Skin: Warm and dry. Neuro: Alert and oriented x3. No focal deficits. Psych: Normal affect. Responds appropriately.   EKG:  The EKG was personally reviewed and demonstrates:  Atrial fibrillation, rate 141 bpm, with non-specific T wave changes.  Telemetry:  Telemetry was personally reviewed and demonstrates:  Atrial fibrillation with rates currently in the 90s to 120s (mostly in the low 100s).  Relevant CV Studies:  Echocardiogram 03/15/2024: 1. Left ventricular ejection fraction, by estimation, is 30 to 35%. The  left ventricle has moderately decreased function. The left ventricle has  no regional wall motion abnormalities. Left ventricular diastolic  parameters are consistent with Grade I  diastolic dysfunction (impaired relaxation). There is the interventricular  septum is flattened in systole, consistent with right ventricular pressure  overload.   2. Reduced pulmonary acceleration time consistent with RV dysfunction.  Right ventricular systolic function is mildly reduced. The right  ventricular size is moderately enlarged. There is moderately elevated  pulmonary artery systolic pressure.   3. Left atrial size was moderately dilated.   4. Right atrial size was mild to moderately dilated.   5. The mitral valve is normal in structure. Mild mitral valve  regurgitation. No evidence of mitral stenosis.   6. The aortic valve is normal in structure. Aortic valve regurgitation is  not visualized. No aortic stenosis is present.   7. The inferior vena cava is dilated in size with <50% respiratory  variability, suggesting right atrial pressure of 15 mmHg.    Laboratory Data:  High Sensitivity Troponin:   Recent Labs  Lab  03/15/24 0923 03/15/24 1120  TROPONINIHS 7 8     Chemistry Recent Labs  Lab 03/15/24 0913 03/16/24 0358  NA 139 139  K 4.3 4.0  CL 105 107  CO2 23 26  GLUCOSE 95 93  BUN 24* 21  CREATININE 1.03 1.05  CALCIUM 9.4 8.6*  GFRNONAA >60 >60  ANIONGAP 11 6    No results for input(s): "PROT", "ALBUMIN", "AST", "ALT", "ALKPHOS", "BILITOT" in the last 168 hours. Lipids  Recent Labs  Lab 03/16/24 0358  CHOL 140  TRIG 43  HDL 49  LDLCALC 82  CHOLHDL 2.9    Hematology Recent Labs  Lab 03/15/24 0913 03/16/24 0358  WBC 7.5 5.8  RBC 4.36 3.68*  HGB 15.3 12.9*  HCT 44.4 38.7*  MCV 101.8* 105.2*  MCH 35.1* 35.1*  MCHC 34.5 33.3  RDW 14.4 14.6  PLT 291 271   Thyroid No results for input(s): "TSH", "FREET4" in the last 168 hours.  BNP Recent Labs  Lab 03/15/24 0925  BNP 387.4*    DDimer  Recent Labs  Lab 03/15/24 0913  DDIMER 5.10*     Radiology/Studies:  ECHOCARDIOGRAM COMPLETE Result Date: 03/15/2024    ECHOCARDIOGRAM REPORT   Patient Name:   Max [name] [facility] Date of Exam: 03/15/2024 Medical Rec #:  [id number]        Height:       72.0 in Accession #:    [id number]       Weight:       205.0 lb Date of Birth:  1951-03-31        BSA:          2.153 m Patient Age:    9 years  BP:           151/87 mmHg Patient Gender: M                HR:           98 bpm. Exam Location:  Inpatient Procedure: 2D Echo, Cardiac Doppler and Color Doppler (Both Spectral and Color            Flow Doppler were utilized during procedure). Indications:    Pulmonary embolus  History:        Patient has prior history of Echocardiogram examinations, most                 recent 07/15/2012. Arrythmias:Atrial Fibrillation,                 Signs/Symptoms:Dyspnea and Syncope; Risk Factors:Hypertension.  Sonographer:    Vern Claude Referring Phys: [id number] ERIC J [place] IMPRESSIONS  1. Left ventricular ejection fraction, by estimation, is 30 to 35%. The left ventricle has moderately decreased function.  The left ventricle has no regional wall motion abnormalities. Left ventricular diastolic parameters are consistent with Grade I diastolic dysfunction (impaired relaxation). There is the interventricular septum is flattened in systole, consistent with right ventricular pressure overload.  2. Reduced pulmonary acceleration time consistent with RV dysfunction. Right ventricular systolic function is mildly reduced. The right ventricular size is moderately enlarged. There is moderately elevated pulmonary artery systolic pressure.  3. Left atrial size was moderately dilated.  4. Right atrial size was mild to moderately dilated.  5. The mitral valve is normal in structure. Mild mitral valve regurgitation. No evidence of mitral stenosis.  6. The aortic valve is normal in structure. Aortic valve regurgitation is not visualized. No aortic stenosis is present.  7. The inferior vena cava is dilated in size with <50% respiratory variability, suggesting right atrial pressure of 15 mmHg. FINDINGS  Left Ventricle: Left ventricular ejection fraction, by estimation, is 30 to 35%. The left ventricle has moderately decreased function. The left ventricle has no regional wall motion abnormalities. The left ventricular internal cavity size was normal in size. There is no left ventricular hypertrophy. The interventricular septum is flattened in systole, consistent with right ventricular pressure overload. Left ventricular diastolic parameters are consistent with Grade I diastolic dysfunction (impaired relaxation). Right Ventricle: Reduced pulmonary acceleration time consistent with RV dysfunction. The right ventricular size is moderately enlarged. No increase in right ventricular wall thickness. Right ventricular systolic function is mildly reduced. There is moderately elevated pulmonary artery systolic pressure. The tricuspid regurgitant velocity is 3.31 m/s, and with an assumed right atrial pressure of 15 mmHg, the estimated right  ventricular systolic pressure is 58.8 mmHg. Left Atrium: Left atrial size was moderately dilated. Right Atrium: Right atrial size was mild to moderately dilated. Pericardium: There is no evidence of pericardial effusion. Mitral Valve: The mitral valve is normal in structure. Mild mitral valve regurgitation. No evidence of mitral valve stenosis. MV peak gradient, 4.6 mmHg. The mean mitral valve gradient is 2.0 mmHg. Tricuspid Valve: The tricuspid valve is normal in structure. Tricuspid valve regurgitation is not demonstrated. No evidence of tricuspid stenosis. Aortic Valve: The aortic valve is normal in structure. Aortic valve regurgitation is not visualized. No aortic stenosis is present. Aortic valve mean gradient measures 1.7 mmHg. Aortic valve peak gradient measures 3.3 mmHg. Aortic valve area, by VTI measures 3.03 cm. Pulmonic Valve: The pulmonic valve was normal in structure. Pulmonic valve regurgitation is not visualized. No evidence of pulmonic stenosis. Aorta:  The aortic root is normal in size and structure. Venous: The inferior vena cava is dilated in size with less than 50% respiratory variability, suggesting right atrial pressure of 15 mmHg. IAS/Shunts: No atrial level shunt detected by color flow Doppler.  LEFT VENTRICLE PLAX 2D LVIDd:         5.40 cm LVIDs:         4.00 cm LV PW:         0.70 cm LV IVS:        0.90 cm LVOT diam:     1.90 cm LV SV:         43 LV SV Index:   20 LVOT Area:     2.84 cm  LV Volumes (MOD) LV vol d, MOD A2C: 181.0 ml LV vol d, MOD A4C: 167.0 ml LV vol s, MOD A2C: 63.8 ml LV vol s, MOD A4C: 71.9 ml LV SV MOD A2C:     117.2 ml LV SV MOD A4C:     167.0 ml LV SV MOD BP:      112.6 ml RIGHT VENTRICLE             IVC RV Basal diam:  4.80 cm     IVC diam: 2.10 cm RV Mid diam:    3.50 cm RV S prime:     11.70 cm/s TAPSE (M-mode): 2.0 cm LEFT ATRIUM              Index        RIGHT ATRIUM           Index LA diam:        3.50 cm  1.63 cm/m   RA Area:     25.70 cm LA Vol (A2C):   72.8  ml  33.81 ml/m  RA Volume:   88.30 ml  41.01 ml/m LA Vol (A4C):   105.0 ml 48.77 ml/m LA Biplane Vol: 87.3 ml  40.55 ml/m  AORTIC VALVE                    PULMONIC VALVE AV Area (Vmax):    2.66 cm     PV Vmax:          0.59 m/s AV Area (Vmean):   1.91 cm     PV Peak grad:     1.4 mmHg AV Area (VTI):     3.03 cm     PR End Diast Vel: 2.94 msec AV Vmax:           90.17 cm/s AV Vmean:          62.633 cm/s AV VTI:            0.142 m AV Peak Grad:      3.3 mmHg AV Mean Grad:      1.7 mmHg LVOT Vmax:         84.50 cm/s LVOT Vmean:        42.300 cm/s LVOT VTI:          0.152 m LVOT/AV VTI ratio: 1.07  AORTA Ao Root diam: 3.40 cm Ao Asc diam:  3.00 cm MITRAL VALVE                 TRICUSPID VALVE MV Area (PHT): 4.12 cm      TR Peak grad:   43.8 mmHg MV Area VTI:   2.35 cm      TR Vmax:        331.00 cm/s MV Peak  grad:  4.6 mmHg MV Mean grad:  2.0 mmHg      SHUNTS MV Vmax:       1.07 m/s      Systemic VTI:  0.15 m MV Vmean:      64.8 cm/s     Systemic Diam: 1.90 cm MV Decel Time: 184 msec MR Peak grad:   90.8 mmHg MR Mean grad:   57.0 mmHg MR Vmax:        476.50 cm/s MR Vmean:       351.5 cm/s MR PISA:        1.01 cm MR PISA Radius: 0.40 cm MV E velocity: 82.50 cm/s Clearnce Hasten Electronically signed by Clearnce Hasten Signature Date/Time: 03/15/2024/4:08:17 PM    Final    CT Angio Chest PE W and/or Wo Contrast Result Date: 03/15/2024 CLINICAL DATA:  Pulmonary embolism (PE) suspected, low to intermediate prob, positive D-dimer EXAM: CT ANGIOGRAPHY CHEST WITH CONTRAST TECHNIQUE: Multidetector CT imaging of the chest was performed using the standard protocol during bolus administration of intravenous contrast. Multiplanar CT image reconstructions and MIPs were obtained to evaluate the vascular anatomy. RADIATION DOSE REDUCTION: This exam was performed according to the departmental dose-optimization program which includes automated exposure control, adjustment of the mA and/or kV according to patient size and/or use  of iterative reconstruction technique. CONTRAST:  OMNIPAQUE IOHEXOL 350 MG/ML SOLN COMPARISON:  CT chest dated 08/20/2018. FINDINGS: Cardiovascular: Satisfactory opacification of the pulmonary arteries to the segmental level. Bilateral pulmonary emboli within the proximal segmental and subsegmental right upper lobe and interlobar pulmonary arteries as well as the left upper and lower lobe segmental and subsegmental branches. RV/LV ratio is equal to or slightly greater than 1 with flattening of the intraventricular septum, concerning for right heart strain. No pericardial effusion. Thoracic aorta is normal in caliber with atherosclerotic calcification. Mediastinum/Nodes: No enlarged mediastinal, hilar, or axillary lymph nodes. Thyroid gland, trachea, and esophagus demonstrate no significant findings. Lungs/Pleura: No pleural effusion or pneumothorax. No focal consolidation. Solid stable 1.7 x 1.6 cm pulmonary nodule in the right lower lobe, previously 1.7 x 1.5 cm, essentially unchanged dating back to September 2018, most compatible with a benign etiology. Small calcified right upper lobe granuloma. Linear parenchymal bands at the lung bases likely reflect atelectasis/scarring. Upper Abdomen: No acute abnormality. Musculoskeletal: No acute osseous abnormality. No suspicious osseous lesion. Multilevel degenerative changes of the thoracic spine. Review of the MIP images confirms the above findings. IMPRESSION: 1. Bilateral pulmonary emboli, within the upper and lower lobe segmental and subsegmental branches. Findings concerning for right heart failure. 2. Stable 1.7 x 1.6 cm pulmonary nodule in the right lower lobe, previously 1.7 x 1.5 cm, essentially unchanged dating back to September 2018, most compatible with a benign etiology. 3.  Aortic Atherosclerosis (ICD10-I70.0). These results were called by telephone at the time of interpretation on 03/15/2024 at 1:24 pm to provider [facility] , who verbally  acknowledged these results. Electronically Signed   By: Hart Robinsons M.D.   On: 03/15/2024 13:36   DG Chest Port 1 View Result Date: 03/15/2024 CLINICAL DATA:  5107 Atrial fibrillation (HCC) 5107 EXAM: PORTABLE CHEST 1 VIEW COMPARISON:  12/11/2019. FINDINGS: Linear area of atelectasis/scarring noted at the lung bases. Bilateral lung fields are otherwise clear. No dense consolidation, lung collapse or pulmonary edema. Bilateral costophrenic angles are clear. Normal cardio-mediastinal silhouette. No acute osseous abnormalities. The soft tissues are within normal limits. IMPRESSION: No active disease. Electronically Signed   By: Jules Schick  M.D.   On: 03/15/2024 10:50     Assessment and Plan:   Paroxysmal Atrial Fibrillation Patient has a history of paroxysmal atrial fibrillation that was initially diagnosed in 2013. He was not started on anticoagulation at that time given low CHA2DS2-VASc score and has not been seen by Cardiology since then until now. He presented from his Rheumatologists office for further evaluation of tachycardia and was noted to be in rapid atrial fibrillation with rates as high as the 140s. He was also found to have bilateral PEs. Echo showed LVEF of 30-35%.  - Remains in atrial fibrillation with rates in the 90s to 120s (mostly in the low 100s). - Potassium 4.0 today.  - Will check Magnesium level and TSH.  - Currently on IV Diltiazem. Will stop this given reduced EF and start Lopressor 25mg  twice daily. Can transition to Toprol-XL prior to discharge. - CHA2D2-VASc = 4 (CHF, aortic atherosclerosis noted on CT, HTN, age). He is currently on IV heparin but will need DOAC prior to discharge (will need to be started on PE treatment dose of DOAC).  - Possibly secondary to PE. Will plan for rate control right now and let him recover from PE. If he is still in atrial fibrillation at follow-up visit, can arrange outpatient DCCV after 3 weeks of uninterrupted anticoagulation.  New  Onset Acute HFrEF BNP this admission mildly elevated in the 300s. Echo showed LVEF of 30-35% with no regional wall motion abnormalities and grade 1 diastolic dysfunction as well as moderately enlarged RV with mildly reduced RV function, moderately elevated PASP, and flattened interventricular septum in systolic consistent with RV pressure overload.  - He has evidence of mild volume overload on exam. - Will give one dose of IV Lasix 40mg  daily.  - Will start Entresto 24-26mg  twice daily.  - Will start Lopressor 25mg  twice daily. Will transition to Toprol-XL prior to discharge.  - Can consider adding Spironolactone tomorrow if BP tolerates above medications. - Will hold off on adding SLGT2 inhibitor right now just in case we are unable to rate control him and he requires TEE/ DCCV prior to discharge. - Continue to monitor daily weights, strict I/Os, and renal function. 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 Can repeat Echo in about 3 months after restoration of sinus rhythm and optimization of GDMT. If no improvement in EF at that time, will need to consider ischemic evaluation.  Hypertension BP initially soft but has since improved.  - Will stop IV Diltiazem as above. - Will start Entresto and Lopressor as above.  Acute PE CTA this admission showed bilateral PE within the upper and lower segmental and subsegmental branch with findings concerning for right heart failure.  - Currently on IV Heparin. Will defer when to switch to DOAC to primary team.  Otherwise, per primary team: - Pulmonary nodule - Dyslipidemia - Rheumatoid arthritis  - History of meningioma - History of seizures   Risk Assessment/Risk Scores:    New York Heart Association (NYHA) Functional Class NYHA Class II-III   CHA2DS2-VASc Score = 4  his indicates a 4.8% annual risk of stroke. The patient's score is based upon: CHF History: 1 HTN History: 1 Diabetes History: 0 Stroke History:  0 Vascular Disease History: 1 Age Score: 1 Gender Score: 0   For questions or updates, please contact [facility] Please consult [email or web address] for contact info under    Signed, Corrin Parker, PA-C  03/16/2024 11:56 AM  Patient seen and examined  with Marjie Skiff, PA-C.  Agree as above, with the following exceptions and changes as noted below.  Patient is a 73 year old male with a history of paroxysmal atrial fibrillation but no prior anticoagulation due to remoteness and low CHADS2 Vasc score.  Presents with bilateral pulmonary embolism, atrial fibrillation, and new acute systolic heart failure.  Unclear etiology of acute systolic heart failure which will require workup, however may be secondary to atrial fibrillation.  May also be secondary to ectopy.  Patient notes that for over 30 years he has had an irregular heart rhythm.  He notes that he has a sports watch which will often report his heart rates varying between 40 and 120.  He feels the irregularity of his heart beats and is very familiar with this sensation.  Unclear if this has represented atrial fibrillation for longer than the recent past.  Gen: NAD, CV: iRRR, no murmurs, Lungs: clear, Abd: soft, Extrem: Warm, well perfused, no edema, Neuro/Psych: alert and oriented x 3, normal mood and affect. All available labs, radiology testing, previous records reviewed.  Discussed care with the patient's wife and the patient in the room.  Etiology of acute systolic heart failure may be rate related from tachycardia mediated cardiomyopathy. Will attempt rate control first.  Will initiate low-dose Lopressor 25 mg twice daily.  Will also initiate GDMT for acute systolic heart failure.  Will eventually need an ischemic workup, may want to defer until PE has been treated for several weeks.  He has received 1 dose of IV Lasix with good response, appears euvolemic on exam but RA pressure on echo 15 mmHg.  Will dose daily as needed.  Unlikely  we will pursue invasive ischemic workup in hospital given bilateral PEs, may be reasonable to transition the patient from IV heparin to DOAC today to facilitate cardioversion if needed for inability to rate control.  However in the setting of bilateral PE, may want to defer cardioversion for a few weeks given that it would need to be performed with TEE.  Will attempt rate control strategy first.    Parke Poisson, MD 03/16/24 5:10 PM

## 2024-03-16 NOTE — Progress Notes (Signed)
 PHARMACY - ANTICOAGULATION CONSULT NOTE  Pharmacy Consult for heparin Indication: atrial fibrillation  Allergies  Allergen Reactions   Vicodin [Hydrocodone-Acetaminophen] Nausea And Vomiting    Patient Measurements: Height: (P) 6' (182.9 cm) Weight: 93 kg (205 lb) IBW/kg (Calculated) : (P) 77.6 HEPARIN DW (KG): 93  Vital Signs: Temp: 97.9 F (36.6 C) (04/02 0200) Temp Source: Oral (04/02 0200) BP: 113/81 (04/02 0200) Pulse Rate: 78 (04/02 0200)  Labs: Recent Labs    03/15/24 0913 03/15/24 0923 03/15/24 1120 03/15/24 1728 03/16/24 0358  HGB 15.3  --   --   --  12.9*  HCT 44.4  --   --   --  38.7*  PLT 291  --   --   --  271  HEPARINUNFRC  --   --   --  0.60 0.47  CREATININE 1.03  --   --   --   --   TROPONINIHS  --  7 8  --   --     Estimated Creatinine Clearance: 70.1 mL/min (by C-G formula based on SCr of 1.03 mg/dL).   Medical History: Past Medical History:  Diagnosis Date   Abdominal hernia 11/28/2015   Allergic rhinitis    Anxiety    BPV (benign positional vertigo), right 05/11/2018   Cardiac arrhythmia    ED (erectile dysfunction)    Herpes    History of syncope    in the setting of anxiety per cardiology note   Hypertension    Kidney tumor (benign), right    sx 02/27/2015   Meningioma (HCC)    had surgery    Paroxysmal atrial fibrillation (HCC) 07/15/2012   a) CHADSVASc score 1b) On full-dose ASA and Verapamil   RA (rheumatoid arthritis) (HCC)    Seizures (HCC) 05/14/2011   "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    Vasovagal syncope     Medications:  Infusions:   diltiazem (CARDIZEM) infusion 5 mg/hr (03/15/24 2320)   heparin 1,300 Units/hr (03/15/24 2328)   lactated ringers Stopped (03/15/24 1120)    Assessment: 72 yom presented to the ED with tachycardia. Now starting IV heparin for afib. CBC is WNL. He is not on anticoagulation PTA.   Today, 03/16/24 Heparin level 0.47- therapeutic on IV heparin 1300 units/hour CBC: Hg 12.9-  slightly low, pltc WNL No bleeding or heparin related issues per nurse  Goal of Therapy:  Heparin level 0.3-0.7 units/ml Monitor platelets by anticoagulation protocol: Yes   Plan:  Continue IV Heparin infusion at 1300 units/hr Monitor daily heparin level, CBC, signs/symptoms of bleeding  Thank you for allowing pharmacy to be a part of this patient's care.  Junita Push, PharmD, BCPS 03/16/2024 5:10 AM

## 2024-03-17 ENCOUNTER — Telehealth (HOSPITAL_COMMUNITY): Payer: Self-pay

## 2024-03-17 ENCOUNTER — Other Ambulatory Visit (HOSPITAL_COMMUNITY): Payer: Self-pay

## 2024-03-17 DIAGNOSIS — I2699 Other pulmonary embolism without acute cor pulmonale: Secondary | ICD-10-CM | POA: Diagnosis not present

## 2024-03-17 DIAGNOSIS — I480 Paroxysmal atrial fibrillation: Secondary | ICD-10-CM | POA: Diagnosis not present

## 2024-03-17 DIAGNOSIS — I5021 Acute systolic (congestive) heart failure: Secondary | ICD-10-CM | POA: Diagnosis not present

## 2024-03-17 LAB — CBC
HCT: 42.3 % (ref 39.0–52.0)
Hemoglobin: 14.1 g/dL (ref 13.0–17.0)
MCH: 34.7 pg — ABNORMAL HIGH (ref 26.0–34.0)
MCHC: 33.3 g/dL (ref 30.0–36.0)
MCV: 104.2 fL — ABNORMAL HIGH (ref 80.0–100.0)
Platelets: 295 10*3/uL (ref 150–400)
RBC: 4.06 MIL/uL — ABNORMAL LOW (ref 4.22–5.81)
RDW: 14.1 % (ref 11.5–15.5)
WBC: 5.7 10*3/uL (ref 4.0–10.5)
nRBC: 0 % (ref 0.0–0.2)

## 2024-03-17 LAB — HEPARIN LEVEL (UNFRACTIONATED): Heparin Unfractionated: 0.41 [IU]/mL (ref 0.30–0.70)

## 2024-03-17 LAB — GLUCOSE, CAPILLARY: Glucose-Capillary: 141 mg/dL — ABNORMAL HIGH (ref 70–99)

## 2024-03-17 MED ORDER — APIXABAN 5 MG PO TABS
10.0000 mg | ORAL_TABLET | Freq: Two times a day (BID) | ORAL | Status: DC
  Administered 2024-03-17: 10 mg via ORAL
  Filled 2024-03-17: qty 2

## 2024-03-17 MED ORDER — SPIRONOLACTONE 12.5 MG HALF TABLET: 12.5000 mg | ORAL_TABLET | Freq: Every day | ORAL | Status: DC

## 2024-03-17 MED ORDER — METOPROLOL SUCCINATE ER 50 MG PO TB24
50.0000 mg | ORAL_TABLET | Freq: Every day | ORAL | 0 refills | Status: DC
  Filled 2024-03-17 (×2): qty 30, 30d supply, fill #0

## 2024-03-17 MED ORDER — APIXABAN (ELIQUIS) VTE STARTER PACK (10MG AND 5MG)
ORAL_TABLET | ORAL | 0 refills | Status: AC
Start: 2024-03-17 — End: ?
  Filled 2024-03-17: qty 74, 30d supply, fill #0

## 2024-03-17 MED ORDER — SPIRONOLACTONE 25 MG PO TABS
12.5000 mg | ORAL_TABLET | Freq: Every day | ORAL | 0 refills | Status: DC
  Filled 2024-03-17: qty 15, 30d supply, fill #0

## 2024-03-17 MED ORDER — EMPAGLIFLOZIN 10 MG PO TABS
10.0000 mg | ORAL_TABLET | Freq: Every day | ORAL | Status: DC
  Administered 2024-03-17: 10 mg via ORAL
  Filled 2024-03-17: qty 1

## 2024-03-17 MED ORDER — SACUBITRIL-VALSARTAN 24-26 MG PO TABS
1.0000 | ORAL_TABLET | Freq: Two times a day (BID) | ORAL | 0 refills | Status: DC
  Filled 2024-03-17: qty 60, 30d supply, fill #0

## 2024-03-17 MED ORDER — METOPROLOL SUCCINATE ER 50 MG PO TB24
50.0000 mg | ORAL_TABLET | Freq: Every day | ORAL | Status: DC
  Administered 2024-03-17: 50 mg via ORAL
  Filled 2024-03-17: qty 1

## 2024-03-17 MED ORDER — APIXABAN 5 MG PO TABS: 5.0000 mg | ORAL_TABLET | Freq: Two times a day (BID) | ORAL | Status: DC

## 2024-03-17 MED ORDER — EMPAGLIFLOZIN 10 MG PO TABS
10.0000 mg | ORAL_TABLET | Freq: Every day | ORAL | 0 refills | Status: DC
  Filled 2024-03-17: qty 30, 30d supply, fill #0

## 2024-03-17 NOTE — Progress Notes (Signed)
 PHARMACY - ANTICOAGULATION CONSULT NOTE  Pharmacy Consult for heparin Indication: PE, atrial fibrillation  Allergies  Allergen Reactions   Vicodin [Hydrocodone-Acetaminophen] Nausea And Vomiting    Patient Measurements: Height: (P) 6' (182.9 cm) Weight: 93 kg (205 lb) IBW/kg (Calculated) : (P) 77.6 HEPARIN DW (KG): 93  Vital Signs: Temp: 98 F (36.7 C) (04/03 0700) Temp Source: Oral (04/03 0700) BP: 120/91 (04/03 0700) Pulse Rate: 87 (04/03 0700)  Labs: Recent Labs    03/15/24 0913 03/15/24 0923 03/15/24 1120 03/15/24 1728 03/16/24 0358 03/17/24 0400  HGB 15.3  --   --   --  12.9* 14.1  HCT 44.4  --   --   --  38.7* 42.3  PLT 291  --   --   --  271 295  HEPARINUNFRC  --   --   --  0.60 0.47 0.41  CREATININE 1.03  --   --   --  1.05  --   TROPONINIHS  --  7 8  --   --   --     Estimated Creatinine Clearance: 68.8 mL/min (by C-G formula based on SCr of 1.05 mg/dL).   Medical History: Past Medical History:  Diagnosis Date   Abdominal hernia 11/28/2015   Allergic rhinitis    Anxiety    BPV (benign positional vertigo), right 05/11/2018   Cardiac arrhythmia    ED (erectile dysfunction)    Herpes    History of syncope    in the setting of anxiety per cardiology note   Hypertension    Kidney tumor (benign), right    sx 02/27/2015   Meningioma (HCC)    had surgery    Paroxysmal atrial fibrillation (HCC) 07/15/2012   a) CHADSVASc score 1b) On full-dose ASA and Verapamil   RA (rheumatoid arthritis) (HCC)    Seizures (HCC) 05/14/2011   "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    Vasovagal syncope     Medications: No anticoagulants PTA  Assessment: Pt is a 66 yoM presenting with tachycardia/palpitations. PMH significant for atrial fibrillation. CTA revealed bilateral PE. Pharmacy consulted to dose heparin.   Today, 03/17/24 Heparin level = 0.41 remains therapeutic on 1300 units/hr CBC: WNL No bleeding issues reported  Goal of Therapy:  Heparin level  0.3-0.7 units/ml Monitor platelets by anticoagulation protocol: Yes   Plan:  Continue heparin infusion at 1300 units/hr CBC, heparin level daily Monitor for signs of bleeding Follow for eventual transition to DOAC  Cindi Carbon, PharmD 03/17/24 8:51 AM

## 2024-03-17 NOTE — Progress Notes (Signed)
 Heart Failure Navigator Progress Note  Assessed for Heart & Vascular TOC clinic readiness.  Patient has a [facility] follow up per Dr. Jacques Navy on 03/28/2024 @ 9 am. .   Navigator will sign off at this time.   Rhae Hammock, BSN, Scientist, clinical (histocompatibility and immunogenetics) Only

## 2024-03-17 NOTE — Telephone Encounter (Signed)
 PAP paperwork filled out and emailed to [facility]

## 2024-03-17 NOTE — Progress Notes (Signed)
 Patient discharged from [facility] per order. Vital signs and blood glucose reading were obtained and recorded. Patient ambulated entire unit without difficulty. Denied SOB or pain after ambulation. Patient educated regarding medications and instructions from AVS. Questions/concerns were addressed. PIV removed. All personal belongings forwarded with patient. Transportation to home via POV.

## 2024-03-17 NOTE — Progress Notes (Signed)
 Medication delivered from [facility] outpatient pharmacy by this RN

## 2024-03-17 NOTE — Discharge Summary (Signed)
 Physician Discharge Summary  KATIE MOCH ZOX:[id number] DOB: [birth year] DOA: 03/15/2024  PCP: Ardith Dark, MD  Admit date: 03/15/2024 Discharge date: 03/17/2024 Recommendations for Outpatient Follow-up:  Follow up with PCP in 1 weeks-call for appointment Please obtain BMP/CBC in one week  Discharge Dispo: Home Discharge Condition: Stable Code Status:   Code Status: Full Code Diet recommendation:  Diet Order             Diet Heart Room service appropriate? Yes; Fluid consistency: Thin  Diet effective now                    Brief/Interim Summary: 73 y.o. male with medical history significant of PAF not on AC, RAs, HTN, HLD, prediabetes who [facility] on 03/15/2024 with palpitations, progressive shortness of breath, generalized weakness ongoing for the past week and workup revealed BNP 387 troponin 7 D-dimer elevated 5.1 chest x-ray unremarkable CT angio chest bilateral PE upper/lower lobe segmental and subsegmental branches and concerning for right heart failure and also A-fib with RVR, PCCM was consulted and admitted Patient did not need advanced therapy, managed with heparin, echo showed low EF cardiology consulted GDMT adjusted as able.  Eliquis initiated heparin discontinued overall doing well.  Once cleared by cardiology and patient does okay on ambulation he will be discharged home  Consultation: PCCM Cardiology  Procedures/testing: CTA> Bilateral pulmonary emboli, within the upper and lower lobe segmental and subsegmental branches. Findings concerning for right heart failure.  Stable 1.7 x 1.6 cm pulmonary nodule in the right lower lobe, previously 1.7 x 1.5 cm, essentially unchanged dating back to September 2018, most compatible with a benign etiology. Echocardiogram: EF 30-35%  Subjective: Seen and examined this morning Resting comfortably on the bedside chair Overnight afebrile, BP stable  Labs reviewed stable CBC With 205 lb  on 41 recheck  pending  Discharge diagnoses :  Acute Bilateral pulm embolism Acute hypoxemic respiratory failure initially needing supplemental oxygen: Patient with dyspnea on exertion symptomatic ffor ew days before admission. No significant stress in the right heart per CT, vitals stable seen by PCCM -no indication for advanced therapies recommending anticoagulation x 3 months May need lifelong for A-fib.  Being managed with heparin drip. Echo shows EF 30-35% no RWMA, G1 DD, IV septum flattens systolic consistent with right ventricular pressure overload and some RV dysfunction moderately elevated PASP> transitioned patient to Eliquis-benefit checked will provide with Eliquis coupon for 30 days and paperwork will be filled for patient assistance by cardiology.  PAF with RVR Essential hypertension: PTA on verapamil-admitted Cardizem drip for A-fib RVR- and stopped 2/2 low ef, now on metoprolol heart rate stable.  Changed to Eliquis this morning overall doing well.  Monitor heart rate on ambulation today. Last TSH normal 3/11-planning for rate control strategy with plan for outpatient DCC[facility] in 3 weeks.  New onset acute HFrEF  EF 30 to 35%-Likely multifactorial in setting of A-fib RVR and bilateral PE.  Mild volume overload on exam-4 is received Lasix- currently off. GDMT being adjusted by cardiology, on Entresto, Lopressor. Continue to monitor intake output Daily weight-  Dyslipidemia: LDL stable.  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: Followed by rheumatology on methotrexate as monotherapy  Stable pulmonary nodule: Follow-up outpatient   Discharge Exam: Vitals:   03/17/24 0700 03/17/24 1337  BP: (!) 120/91 116/78  Pulse: 87 80  Resp:  16  Temp: 98 F (36.7 C) (!) 97.4 F (36.3 C)  SpO2: 97% 98%  General: Pt is alert, awake, not in acute distress Cardiovascular: RRR, S1/S2 +, no rubs, no gallops Respiratory: CTA bilaterally, no wheezing, no rhonchi Abdominal: Soft, NT, ND, bowel  sounds + Extremities: no edema, no cyanosis  Discharge Instructions  Discharge Instructions     (HEART FAILURE PATIENTS) Call MD:  Anytime you have any of the following symptoms: 1) 3 pound weight gain in 24 hours or 5 pounds in 1 week 2) shortness of breath, with or without a dry hacking cough 3) swelling in the hands, feet or stomach 4) if you have to sleep on extra pillows at night in order to breathe.   Complete by: As directed    Amb Referral to [facility]   Complete by: As directed    Discharge instructions   Complete by: As directed    Please call call MD or return to ER for similar or worsening recurring problem that brought you to hospital or if any fever,nausea/vomiting,abdominal pain, uncontrolled pain, chest pain,  shortness of breath or any other alarming symptoms.  Please follow-up your doctor as instructed in a week time and call the office for appointment.  Please avoid alcohol, smoking, or any other illicit substance and maintain healthy habits including taking your regular medications as prescribed.  You were cared for by a hospitalist during your hospital stay. If you have any questions about your discharge medications or the care you received while you were in the hospital after you are discharged, you can call the unit and ask to speak with the hospitalist on call if the hospitalist that took care of you is not available.  Once you are discharged, your primary care physician will handle any further medical issues. Please note that NO REFILLS for any discharge medications will be authorized once you are discharged, as it is imperative that you return to your primary care physician (or establish a relationship with a primary care physician if you do not have one) for your aftercare needs so that they can reassess your need for medications and monitor your lab values   Increase activity slowly   Complete by: As directed       Allergies as of 03/17/2024       Reactions    Vicodin [hydrocodone-acetaminophen] Nausea And Vomiting        Medication List     STOP taking these medications    aspirin EC 81 MG tablet   verapamil 180 MG CR tablet Commonly known as: CALAN-SR       TAKE these medications    acyclovir 800 MG tablet Commonly known as: ZOVIRAX Take 1 tablet (800 mg total) by mouth 3 (three) times daily as needed. For outbreaks   Apixaban Starter Pack (10mg  and 5mg ) Commonly known as: ELIQUIS STARTER PACK Take as directed on package: start with two-5mg  tablets twice daily for 7 days until 03/23/24. On day 8 which is 03/24/24- switch to one-5mg  tablet twice daily.   Centrum Silver 50+Men Tabs Take 1 tablet by mouth daily with breakfast.   CO Q 10 PO Take 1 capsule by mouth daily after supper.   empagliflozin 10 MG Tabs tablet Commonly known as: JARDIANCE Take 1 tablet (10 mg total) by mouth daily. Start taking on: March 18, 2024   folic acid 1 MG tablet Commonly known as: FOLVITE Take 2 tablets (2 mg total) by mouth daily.   methotrexate 2.5 MG tablet Commonly known as: RHEUMATREX TAKE 8 TABLETS (20 MG TOTAL) BY MOUTH ONCE A WEEK.  CAUTION:CHEMOTHERAPY. PROTECT FROM LIGHT. What changed: when to take this   metoprolol succinate 50 MG 24 hr tablet Commonly known as: TOPROL-XL Take 1 tablet (50 mg total) by mouth daily. Take with or immediately following a meal. Start taking on: March 18, 2024   PROBIOTIC PO Take 1 capsule by mouth in the morning.   sacubitril-valsartan 24-26 MG Commonly known as: ENTRESTO Take 1 tablet by mouth 2 (two) times daily.   spironolactone 25 MG tablet Commonly known as: ALDACTONE Take 0.5 tablets (12.5 mg total) by mouth daily. Start taking on: March 18, 2024   vitamin C 1000 MG tablet Take 1,000 mg by mouth daily.   Vitamin D3 1000 units Caps Take 1,000 Units by mouth at bedtime.        Follow-up Information     Ardith Dark, MD. Schedule an appointment as soon as possible for a visit  in 1 week(s).   Specialty: Family Medicine Contact information: [address] [place] [address] [phone]         Ardith Dark, MD Follow up in 1 week(s).   Specialty: Family Medicine Contact information: [address] [place] [address] [phone]                Allergies  Allergen Reactions   Vicodin [Hydrocodone-Acetaminophen] Nausea And Vomiting    The results of significant diagnostics from this hospitalization (including imaging, microbiology, ancillary and laboratory) are listed below for reference.    Microbiology: Recent Results (from the past 240 hours)  Resp panel by RT-PCR (RSV, Flu A&B, Covid) Anterior Nasal Swab     Status: None   Collection Time: 03/15/24  9:40 AM   Specimen: Anterior Nasal Swab  Result Value Ref Range Status   SARS Coronavirus 2 by RT PCR NEGATIVE NEGATIVE Final    Comment: (NOTE) SARS-CoV-2 target nucleic acids are NOT DETECTED.  The SARS-CoV-2 RNA is generally detectable in upper respiratory specimens during the acute phase of infection. The lowest concentration of SARS-CoV-2 viral copies this assay can detect is 138 copies/mL. A negative result does not preclude SARS-Cov-2 infection and should not be used as the sole basis for treatment or other patient management decisions. A negative result may occur with  improper specimen collection/handling, submission of specimen other than nasopharyngeal swab, presence of viral mutation(s) within the areas targeted by this assay, and inadequate number of viral copies(<138 copies/mL). A negative result must be combined with clinical observations, patient history, and epidemiological information. The expected result is Negative.  Fact Sheet for Patients:  BloggerCourse.com  Fact Sheet for Healthcare Providers:  SeriousBroker.it  This test is no t yet approved or cleared by the [place] FDA and  has been authorized  for detection and/or diagnosis of SARS-CoV-2 by FDA under an Emergency Use Authorization (EUA). This EUA will remain  in effect (meaning this test can be used) for the duration of the COVID-19 declaration under Section 564(b)(1) of the Act, 21 U.S.C.section 360bbb-3(b)(1), unless the authorization is terminated  or revoked sooner.       Influenza A by PCR NEGATIVE NEGATIVE Final   Influenza B by PCR NEGATIVE NEGATIVE Final    Comment: (NOTE) The Xpert Xpress SARS-CoV-2/FLU/RSV plus assay is intended as an aid in the diagnosis of influenza from Nasopharyngeal swab specimens and should not be used as a sole basis for treatment. Nasal washings and aspirates are unacceptable for Xpert Xpress SARS-CoV-2/FLU/RSV testing.  Fact Sheet for Patients: BloggerCourse.com  Fact Sheet for Healthcare  Providers: SeriousBroker.it  This test is not yet approved or cleared by the [place] and has been authorized for detection and/or diagnosis of SARS-CoV-2 by FDA under an Emergency Use Authorization (EUA). This EUA will remain in effect (meaning this test can be used) for the duration of the COVID-19 declaration under Section 564(b)(1) of the Act, 21 U.S.C. section 360bbb-3(b)(1), unless the authorization is terminated or revoked.     Resp Syncytial Virus by PCR NEGATIVE NEGATIVE Final    Comment: (NOTE) Fact Sheet for Patients: BloggerCourse.com  Fact Sheet for Healthcare Providers: SeriousBroker.it  This test is not yet approved or cleared by the [place] FDA and has been authorized for detection and/or diagnosis of SARS-CoV-2 by FDA under an Emergency Use Authorization (EUA). This EUA will remain in effect (meaning this test can be used) for the duration of the COVID-19 declaration under Section 564(b)(1) of the Act, 21 U.S.C. section 360bbb-3(b)(1), unless the authorization is  terminated or revoked.  Performed at [facility], [address], [place], [place] [address]     Procedures/Studies: VAS [place] LOWER EXTREMITY VENOUS (DVT) Result Date: 03/16/2024  Lower Venous DVT Study Patient Name:  ASAD KEEVEN [facility]  Date of Exam:   03/16/2024 Medical Rec #: [id number]         Accession #:    [id number] Date of Birth: 10-Sep-1951         Patient Gender: M Patient Age:   27 years Exam Location:  [facility] Procedure:      VAS [place] LOWER EXTREMITY VENOUS (DVT) Referring Phys: ERIC [place] --------------------------------------------------------------------------------  Indications: Pulmonary embolism.  Comparison Study: No previous exams Performing Technologist: Jody Hill RVT, RDMS  Examination Guidelines: A complete evaluation includes B-mode imaging, spectral Doppler, color Doppler, and power Doppler as needed of all accessible portions of each vessel. Bilateral testing is considered an integral part of a complete examination. Limited examinations for reoccurring indications may be performed as noted. The reflux portion of the exam is performed with the patient in reverse Trendelenburg.  +---------+---------------+---------+-----------+----------+--------------+ RIGHT    CompressibilityPhasicitySpontaneityPropertiesThrombus Aging +---------+---------------+---------+-----------+----------+--------------+ CFV      Full           Yes      Yes                                 +---------+---------------+---------+-----------+----------+--------------+ SFJ      Full                                                        +---------+---------------+---------+-----------+----------+--------------+ FV Prox  Full           Yes      Yes                                 +---------+---------------+---------+-----------+----------+--------------+ FV Mid   Full           Yes      Yes                                  +---------+---------------+---------+-----------+----------+--------------+ FV DistalFull  Yes      Yes                                 +---------+---------------+---------+-----------+----------+--------------+ PFV      Full                                                        +---------+---------------+---------+-----------+----------+--------------+ POP      Full           Yes      Yes                                 +---------+---------------+---------+-----------+----------+--------------+ PTV      Full                                                        +---------+---------------+---------+-----------+----------+--------------+ PERO     Full                                                        +---------+---------------+---------+-----------+----------+--------------+   +---------+---------------+---------+-----------+----------+--------------+ LEFT     CompressibilityPhasicitySpontaneityPropertiesThrombus Aging +---------+---------------+---------+-----------+----------+--------------+ CFV      Full           Yes      Yes                                 +---------+---------------+---------+-----------+----------+--------------+ SFJ      Full                                                        +---------+---------------+---------+-----------+----------+--------------+ FV Prox  Full           Yes      Yes                                 +---------+---------------+---------+-----------+----------+--------------+ FV Mid   Full           Yes      Yes                                 +---------+---------------+---------+-----------+----------+--------------+ FV DistalFull           Yes      Yes                                 +---------+---------------+---------+-----------+----------+--------------+ PFV      Full                                                         +---------+---------------+---------+-----------+----------+--------------+  POP      Full           Yes      Yes                                 +---------+---------------+---------+-----------+----------+--------------+ PTV      Full                                                        +---------+---------------+---------+-----------+----------+--------------+ PERO     Full                                                        +---------+---------------+---------+-----------+----------+--------------+     Summary: BILATERAL: - No evidence of deep vein thrombosis seen in the lower extremities, bilaterally. -No evidence of popliteal cyst, bilaterally.   *See table(s) above for measurements and observations. Electronically signed by Coral Else MD on 03/16/2024 at 7:00:43 PM.    Final    ECHOCARDIOGRAM COMPLETE Result Date: 03/15/2024    ECHOCARDIOGRAM REPORT   Patient Name:   JUDITH CAMPILLO [facility] Date of Exam: 03/15/2024 Medical Rec #:  [id number]        Height:       72.0 in Accession #:    [id number]       Weight:       205.0 lb Date of Birth:  Oct 18, 1951        BSA:          2.153 m Patient Age:    72 years         BP:           151/87 mmHg Patient Gender: M                HR:           98 bpm. Exam Location:  Inpatient Procedure: 2D Echo, Cardiac Doppler and Color Doppler (Both Spectral and Color            Flow Doppler were utilized during procedure). Indications:    Pulmonary embolus  History:        Patient has prior history of Echocardiogram examinations, most                 recent 07/15/2012. Arrythmias:Atrial Fibrillation,                 Signs/Symptoms:Dyspnea and Syncope; Risk Factors:Hypertension.  Sonographer:    Vern Claude Referring Phys: [id number] ERIC J [place] IMPRESSIONS  1. Left ventricular ejection fraction, by estimation, is 30 to 35%. The left ventricle has moderately decreased function. The left ventricle has no regional wall motion abnormalities. Left ventricular diastolic  parameters are consistent with Grade I diastolic dysfunction (impaired relaxation). There is the interventricular septum is flattened in systole, consistent with right ventricular pressure overload.  2. Reduced pulmonary acceleration time consistent with RV dysfunction. Right ventricular systolic function is mildly reduced. The right ventricular size is moderately enlarged. There is moderately elevated pulmonary artery systolic pressure.  3. Left atrial size was moderately dilated.  4. Right atrial size was mild to moderately dilated.  5. The mitral valve is normal in structure. Mild mitral valve regurgitation. No evidence of mitral stenosis.  6. The aortic valve is normal in structure. Aortic valve regurgitation is not visualized. No aortic stenosis is present.  7. The inferior vena cava is dilated in size with <50% respiratory variability, suggesting right atrial pressure of 15 mmHg. FINDINGS  Left Ventricle: Left ventricular ejection fraction, by estimation, is 30 to 35%. The left ventricle has moderately decreased function. The left ventricle has no regional wall motion abnormalities. The left ventricular internal cavity size was normal in size. There is no left ventricular hypertrophy. The interventricular septum is flattened in systole, consistent with right ventricular pressure overload. Left ventricular diastolic parameters are consistent with Grade I diastolic dysfunction (impaired relaxation). Right Ventricle: Reduced pulmonary acceleration time consistent with RV dysfunction. The right ventricular size is moderately enlarged. No increase in right ventricular wall thickness. Right ventricular systolic function is mildly reduced. There is moderately elevated pulmonary artery systolic pressure. The tricuspid regurgitant velocity is 3.31 m/s, and with an assumed right atrial pressure of 15 mmHg, the estimated right ventricular systolic pressure is 58.8 mmHg. Left Atrium: Left atrial size was moderately  dilated. Right Atrium: Right atrial size was mild to moderately dilated. Pericardium: There is no evidence of pericardial effusion. Mitral Valve: The mitral valve is normal in structure. Mild mitral valve regurgitation. No evidence of mitral valve stenosis. MV peak gradient, 4.6 mmHg. The mean mitral valve gradient is 2.0 mmHg. Tricuspid Valve: The tricuspid valve is normal in structure. Tricuspid valve regurgitation is not demonstrated. No evidence of tricuspid stenosis. Aortic Valve: The aortic valve is normal in structure. Aortic valve regurgitation is not visualized. No aortic stenosis is present. Aortic valve mean gradient measures 1.7 mmHg. Aortic valve peak gradient measures 3.3 mmHg. Aortic valve area, by VTI measures 3.03 cm. Pulmonic Valve: The pulmonic valve was normal in structure. Pulmonic valve regurgitation is not visualized. No evidence of pulmonic stenosis. Aorta: The aortic root is normal in size and structure. Venous: The inferior vena cava is dilated in size with less than 50% respiratory variability, suggesting right atrial pressure of 15 mmHg. IAS/Shunts: No atrial level shunt detected by color flow Doppler.  LEFT VENTRICLE PLAX 2D LVIDd:         5.40 cm LVIDs:         4.00 cm LV PW:         0.70 cm LV IVS:        0.90 cm LVOT diam:     1.90 cm LV SV:         43 LV SV Index:   20 LVOT Area:     2.84 cm  LV Volumes (MOD) LV vol d, MOD A2C: 181.0 ml LV vol d, MOD A4C: 167.0 ml LV vol s, MOD A2C: 63.8 ml LV vol s, MOD A4C: 71.9 ml LV SV MOD A2C:     117.2 ml LV SV MOD A4C:     167.0 ml LV SV MOD BP:      112.6 ml RIGHT VENTRICLE             IVC RV Basal diam:  4.80 cm     IVC diam: 2.10 cm RV Mid diam:    3.50 cm RV S prime:     11.70 cm/s TAPSE (M-mode): 2.0 cm LEFT ATRIUM              Index        RIGHT ATRIUM  Index LA diam:        3.50 cm  1.63 cm/m   RA Area:     25.70 cm LA Vol (A2C):   72.8 ml  33.81 ml/m  RA Volume:   88.30 ml  41.01 ml/m LA Vol (A4C):   105.0 ml 48.77 ml/m  LA Biplane Vol: 87.3 ml  40.55 ml/m  AORTIC VALVE                    PULMONIC VALVE AV Area (Vmax):    2.66 cm     PV Vmax:          0.59 m/s AV Area (Vmean):   1.91 cm     PV Peak grad:     1.4 mmHg AV Area (VTI):     3.03 cm     PR End Diast Vel: 2.94 msec AV Vmax:           90.17 cm/s AV Vmean:          62.633 cm/s AV VTI:            0.142 m AV Peak Grad:      3.3 mmHg AV Mean Grad:      1.7 mmHg LVOT Vmax:         84.50 cm/s LVOT Vmean:        42.300 cm/s LVOT VTI:          0.152 m LVOT/AV VTI ratio: 1.07  AORTA Ao Root diam: 3.40 cm Ao Asc diam:  3.00 cm MITRAL VALVE                 TRICUSPID VALVE MV Area (PHT): 4.12 cm      TR Peak grad:   43.8 mmHg MV Area VTI:   2.35 cm      TR Vmax:        331.00 cm/s MV Peak grad:  4.6 mmHg MV Mean grad:  2.0 mmHg      SHUNTS MV Vmax:       1.07 m/s      Systemic VTI:  0.15 m MV Vmean:      64.8 cm/s     Systemic Diam: 1.90 cm MV Decel Time: 184 msec MR Peak grad:   90.8 mmHg MR Mean grad:   57.0 mmHg MR Vmax:        476.50 cm/s MR Vmean:       351.5 cm/s MR PISA:        1.01 cm MR PISA Radius: 0.40 cm MV E velocity: 82.50 cm/s Clearnce Hasten Electronically signed by Clearnce Hasten Signature Date/Time: 03/15/2024/4:08:17 PM    Final    CT Angio Chest PE W and/or Wo Contrast Result Date: 03/15/2024 CLINICAL DATA:  Pulmonary embolism (PE) suspected, low to intermediate prob, positive D-dimer EXAM: CT ANGIOGRAPHY CHEST WITH CONTRAST TECHNIQUE: Multidetector CT imaging of the chest was performed using the standard protocol during bolus administration of intravenous contrast. Multiplanar CT image reconstructions and MIPs were obtained to evaluate the vascular anatomy. RADIATION DOSE REDUCTION: This exam was performed according to the departmental dose-optimization program which includes automated exposure control, adjustment of the mA and/or kV according to patient size and/or use of iterative reconstruction technique. CONTRAST:  OMNIPAQUE IOHEXOL 350 MG/ML SOLN  COMPARISON:  CT chest dated 08/20/2018. FINDINGS: Cardiovascular: Satisfactory opacification of the pulmonary arteries to the segmental level. Bilateral pulmonary emboli within the proximal segmental and subsegmental right upper lobe and interlobar pulmonary arteries as  well as the left upper and lower lobe segmental and subsegmental branches. RV/LV ratio is equal to or slightly greater than 1 with flattening of the intraventricular septum, concerning for right heart strain. No pericardial effusion. Thoracic aorta is normal in caliber with atherosclerotic calcification. Mediastinum/Nodes: No enlarged mediastinal, hilar, or axillary lymph nodes. Thyroid gland, trachea, and esophagus demonstrate no significant findings. Lungs/Pleura: No pleural effusion or pneumothorax. No focal consolidation. Solid stable 1.7 x 1.6 cm pulmonary nodule in the right lower lobe, previously 1.7 x 1.5 cm, essentially unchanged dating back to September 2018, most compatible with a benign etiology. Small calcified right upper lobe granuloma. Linear parenchymal bands at the lung bases likely reflect atelectasis/scarring. Upper Abdomen: No acute abnormality. Musculoskeletal: No acute osseous abnormality. No suspicious osseous lesion. Multilevel degenerative changes of the thoracic spine. Review of the MIP images confirms the above findings. IMPRESSION: 1. Bilateral pulmonary emboli, within the upper and lower lobe segmental and subsegmental branches. Findings concerning for right heart failure. 2. Stable 1.7 x 1.6 cm pulmonary nodule in the right lower lobe, previously 1.7 x 1.5 cm, essentially unchanged dating back to September 2018, most compatible with a benign etiology. 3.  Aortic Atherosclerosis (ICD10-I70.0). These results were called by telephone at the time of interpretation on 03/15/2024 at 1:24 pm to provider [facility] , who verbally acknowledged these results. Electronically Signed   By: Hart Robinsons M.D.   On: 03/15/2024  13:36   DG Chest Port 1 View Result Date: 03/15/2024 CLINICAL DATA:  5107 Atrial fibrillation (HCC) 5107 EXAM: PORTABLE CHEST 1 VIEW COMPARISON:  12/11/2019. FINDINGS: Linear area of atelectasis/scarring noted at the lung bases. Bilateral lung fields are otherwise clear. No dense consolidation, lung collapse or pulmonary edema. Bilateral costophrenic angles are clear. Normal cardio-mediastinal silhouette. No acute osseous abnormalities. The soft tissues are within normal limits. IMPRESSION: No active disease. Electronically Signed   By: Jules Schick M.D.   On: 03/15/2024 10:50    Labs: BNP (last 3 results) Recent Labs    03/15/24 0925  BNP 387.4*   Basic Metabolic Panel: Recent Labs  Lab 03/15/24 0913 03/16/24 0358 03/16/24 1208  NA 139 139  --   K 4.3 4.0  --   CL 105 107  --   CO2 23 26  --   GLUCOSE 95 93  --   BUN 24* 21  --   CREATININE 1.03 1.05  --   CALCIUM 9.4 8.6*  --   MG  --   --  2.2   Liver Function Tests: No results for input(s): "AST", "ALT", "ALKPHOS", "BILITOT", "PROT", "ALBUMIN" in the last 168 hours. No results for input(s): "LIPASE", "AMYLASE" in the last 168 hours. No results for input(s): "AMMONIA" in the last 168 hours. CBC: Recent Labs  Lab 03/15/24 0913 03/16/24 0358 03/17/24 0400  WBC 7.5 5.8 5.7  HGB 15.3 12.9* 14.1  HCT 44.4 38.7* 42.3  MCV 101.8* 105.2* 104.2*  PLT 291 271 295   Cardiac Enzymes: No results for input(s): "CKTOTAL", "CKMB", "CKMBINDEX", "TROPONINI" in the last 168 hours. BNP: Invalid input(s): "POCBNP" CBG: No results for input(s): "GLUCAP" in the last 168 hours. D-Dimer Recent Labs    03/15/24 0913  DDIMER 5.10*   Hgb A1c Recent Labs    03/16/24 0358  HGBA1C 5.5   Lipid Profile Recent Labs    03/16/24 0358  CHOL 140  HDL 49  LDLCALC 82  TRIG 43  CHOLHDL 2.9   Thyroid function studies Recent Labs  03/16/24 1208  TSH 4.450   Anemia work up No results for input(s): "VITAMINB12", "FOLATE",  "FERRITIN", "TIBC", "IRON", "RETICCTPCT" in the last 72 hours. Urinalysis    Component Value Date/Time   COLORURINE YELLOW 02/04/2014 0820   APPEARANCEUR CLOUDY (A) 02/04/2014 0820   LABSPEC 1.028 02/04/2014 0820   PHURINE 7.5 02/04/2014 0820   GLUCOSEU NEGATIVE 02/04/2014 0820   HGBUR NEGATIVE 02/04/2014 0820   HGBUR negative 10/11/2010 0751   BILIRUBINUR Negative 05/04/2020 1346   KETONESUR NEGATIVE 02/04/2014 0820   PROTEINUR Negative 05/04/2020 1346   PROTEINUR 30 (A) 02/04/2014 0820   UROBILINOGEN 0.2 05/04/2020 1346   UROBILINOGEN 1.0 02/04/2014 0820   NITRITE Negative 05/04/2020 1346   NITRITE NEGATIVE 02/04/2014 0820   LEUKOCYTESUR Negative 05/04/2020 1346   Sepsis Labs Recent Labs  Lab 03/15/24 0913 03/16/24 0358 03/17/24 0400  WBC 7.5 5.8 5.7   Microbiology Recent Results (from the past 240 hours)  Resp panel by RT-PCR (RSV, Flu A&B, Covid) Anterior Nasal Swab     Status: None   Collection Time: 03/15/24  9:40 AM   Specimen: Anterior Nasal Swab  Result Value Ref Range Status   SARS Coronavirus 2 by RT PCR NEGATIVE NEGATIVE Final    Comment: (NOTE) SARS-CoV-2 target nucleic acids are NOT DETECTED.  The SARS-CoV-2 RNA is generally detectable in upper respiratory specimens during the acute phase of infection. The lowest concentration of SARS-CoV-2 viral copies this assay can detect is 138 copies/mL. A negative result does not preclude SARS-Cov-2 infection and should not be used as the sole basis for treatment or other patient management decisions. A negative result may occur with  improper specimen collection/handling, submission of specimen other than nasopharyngeal swab, presence of viral mutation(s) within the areas targeted by this assay, and inadequate number of viral copies(<138 copies/mL). A negative result must be combined with clinical observations, patient history, and epidemiological information. The expected result is Negative.  Fact Sheet for  Patients:  BloggerCourse.com  Fact Sheet for Healthcare Providers:  SeriousBroker.it  This test is no t yet approved or cleared by the [place] FDA and  has been authorized for detection and/or diagnosis of SARS-CoV-2 by FDA under an Emergency Use Authorization (EUA). This EUA will remain  in effect (meaning this test can be used) for the duration of the COVID-19 declaration under Section 564(b)(1) of the Act, 21 U.S.C.section 360bbb-3(b)(1), unless the authorization is terminated  or revoked sooner.       Influenza A by PCR NEGATIVE NEGATIVE Final   Influenza B by PCR NEGATIVE NEGATIVE Final    Comment: (NOTE) The Xpert Xpress SARS-CoV-2/FLU/RSV plus assay is intended as an aid in the diagnosis of influenza from Nasopharyngeal swab specimens and should not be used as a sole basis for treatment. Nasal washings and aspirates are unacceptable for Xpert Xpress SARS-CoV-2/FLU/RSV testing.  Fact Sheet for Patients: BloggerCourse.com  Fact Sheet for Healthcare Providers: SeriousBroker.it  This test is not yet approved or cleared by the [place] FDA and has been authorized for detection and/or diagnosis of SARS-CoV-2 by FDA under an Emergency Use Authorization (EUA). This EUA will remain in effect (meaning this test can be used) for the duration of the COVID-19 declaration under Section 564(b)(1) of the Act, 21 U.S.C. section 360bbb-3(b)(1), unless the authorization is terminated or revoked.     Resp Syncytial Virus by PCR NEGATIVE NEGATIVE Final    Comment: (NOTE) Fact Sheet for Patients: BloggerCourse.com  Fact Sheet for Healthcare Providers: SeriousBroker.it  This test  is not yet approved or cleared by the [place] and has been authorized for detection and/or diagnosis of SARS-CoV-2 by FDA under an Emergency  Use Authorization (EUA). This EUA will remain in effect (meaning this test can be used) for the duration of the COVID-19 declaration under Section 564(b)(1) of the Act, 21 U.S.C. section 360bbb-3(b)(1), unless the authorization is terminated or revoked.  Performed at [facility], [address], [place], [place] [address]    Time coordinating discharge: 35 minutes  SIGNED: Lanae Boast, MD  [facility] 03/17/2024, 2:26 PM  If 7PM-7AM, please contact night-coverage [email or web address]

## 2024-03-17 NOTE — TOC Benefit Eligibility Note (Signed)
 Pharmacy Patient Advocate Encounter  Insurance verification completed.    The patient is insured through [employer]. Patient has Medicare and is not eligible for a copay card, but may be able to apply for patient assistance or Medicare RX Payment Plan (Patient Must reach out to their plan, if eligible for payment plan), if available.    Ran test claim for Jardiance 10mg  and the current 30 day co-pay is $158.54.  Ran test claim for Metoprolol Er 50mg  and the current 30 day co-pay is $2.00   This test claim was processed through [employer]- copay amounts may vary at other pharmacies due to [employer], or as the patient moves through the different stages of their insurance plan.

## 2024-03-17 NOTE — Progress Notes (Signed)
 Mobility Specialist - Progress Note   03/17/24 1122  Mobility  Activity Ambulated independently in hallway  Level of Assistance Independent  Assistive Device None  Distance Ambulated (ft) 350 ft  Activity Response Tolerated well  Mobility Referral Yes  Mobility visit 1 Mobility  Mobility Specialist Start Time (ACUTE ONLY) 1114  Mobility Specialist Stop Time (ACUTE ONLY) 1122  Mobility Specialist Time Calculation (min) (ACUTE ONLY) 8 min   Pt received in recliner and agreeable to mobility. No complaints during session. Pt to recliner after session with all needs met.    During mobility: 120 HR Post-mobility: 101 HR  [facility] nurse

## 2024-03-17 NOTE — Plan of Care (Signed)
  Problem: Clinical Measurements: Goal: Will remain free from infection Outcome: Progressing   Problem: Clinical Measurements: Goal: Diagnostic test results will improve Outcome: Progressing   Problem: Clinical Measurements: Goal: Respiratory complications will improve Outcome: Progressing   Problem: Clinical Measurements: Goal: Cardiovascular complication will be avoided Outcome: Progressing   Problem: Activity: Goal: Risk for activity intolerance will decrease Outcome: Progressing   

## 2024-03-17 NOTE — Progress Notes (Signed)
 Patient Name: Max [name] Date of Encounter: 03/17/2024 [facility] Cardiologist: None   Interval Summary  .    Feels well and ready to go home. No am labs to review  Vital Signs .    Vitals:   03/16/24 2019 03/17/24 0435 03/17/24 0700 03/17/24 1000  BP: 125/79 113/79 (!) 120/91   Pulse: 88 (!) 59 87   Resp: 16 18    Temp: 98.1 F (36.7 C) 97.8 F (36.6 C) 98 F (36.7 C)   TempSrc: Oral Oral Oral   SpO2: 94% 97% 97%   Weight:    92.7 kg  Height:        Intake/Output Summary (Last 24 hours) at 03/17/2024 1312 Last data filed at 03/17/2024 1158 Gross per 24 hour  Intake 459.94 ml  Output 300 ml  Net 159.94 ml      03/17/2024   10:00 AM 03/15/2024    9:10 AM 03/15/2024    8:10 AM  Last 3 Weights  Weight (lbs) 204 lb 6.4 oz 205 lb 210 lb  Weight (kg) 92.715 kg 92.987 kg 95.255 kg      Telemetry/ECG    Afib rates 90s, 2.5 second pause in afib- Personally Reviewed  Physical Exam .   GEN: No acute distress.   Neck: No JVD Cardiac: iRRR, no murmurs, rubs, or gallops.  Respiratory: Clear to auscultation bilaterally. GI: Soft, nontender, non-distended  MS: No edema  Assessment & Plan .     Paroxysmal Atrial Fibrillation Patient has a history of paroxysmal atrial fibrillation that was initially diagnosed in 2013. He was not started on anticoagulation at that time given low CHA2DS2-VASc score and has not been seen by Cardiology since then until now. He presented from his Rheumatologists office for further evaluation of tachycardia and was noted to be in rapid atrial fibrillation with rates as high as the 140s. He was also found to have bilateral PEs. Echo showed LVEF of 30-35%.  - Remains in atrial fibrillation with rates in the 90s to 120s (mostly in the low 100s). - Potassium 4.0 yesterday.  - Will check Magnesium level and TSH - normal yesterday - transition lopressor to TOPROL XL 50 MG DAILY TODAY - CHA2D2-VASc = 4 (CHF, aortic atherosclerosis noted on  CT, HTN, age). Now on eliquis PE dosing.  - Possibly secondary to PE. Will plan for rate control right now and let him recover from PE. If he is still in atrial fibrillation at follow-up visit, can arrange outpatient DCCV after 3 weeks of uninterrupted anticoagulation. - needs outpatient sleep study - prior patient of Dr. Isaiah Serge, recommend pulm referral for sleep study.    New Onset Acute HFrEF BNP this admission mildly elevated in the 300s. Echo showed LVEF of 30-35% with no regional wall motion abnormalities and grade 1 diastolic dysfunction as well as moderately enlarged RV with mildly reduced RV function, moderately elevated PASP, and flattened interventricular septum in systolic consistent with RV pressure overload.  Sherryll Burger 24-26mg  twice daily.  - toprol xl 50 mg daily - start spironolactone tomorrow 12.5 mg daily - jardiance 10 mg daily today -  Consider giving patient Rx for lasix 20 mg po PRN swelling as outpatient. 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 Can repeat Echo in about 3 months after restoration of sinus rhythm and optimization of GDMT.  - consider outpatient ischemic eval if stable from PE standpoint at close follow up.   Hypertension Bp low normal, hopefully  he will tolerate HF therapy as above. No dizziness.    Acute PE - eliquis PE dosing.    Primary service hoping to discharge today, will require close outpatient cardiology follow up in that case. 03/28/24 appt made.  For questions or updates, please contact [facility] Please consult [email or web address] for contact info under        Signed, Parke Poisson, MD

## 2024-03-17 NOTE — Discharge Instructions (Addendum)
 Information on my medicine - ELIQUIS (apixaban)  This medication education was reviewed with me or my healthcare representative as part of my discharge preparation.    Why was Eliquis prescribed for you? Eliquis was prescribed to treat blood clots that may have been found in the veins of your legs (deep vein thrombosis) or in your lungs (pulmonary embolism) and to reduce the risk of them occurring again.  What do You need to know about Eliquis ? The starting dose is 10 mg (two 5 mg tablets) taken TWICE daily for the FIRST SEVEN (7) DAYS, then on 03/24/2024  the dose is reduced to ONE 5 mg tablet taken TWICE daily.  Eliquis may be taken with or without food.   Try to take the dose about the same time in the morning and in the evening. If you have difficulty swallowing the tablet whole please discuss with your pharmacist how to take the medication safely.  Take Eliquis exactly as prescribed and DO NOT stop taking Eliquis without talking to the doctor who prescribed the medication.  Stopping may increase your risk of developing a new blood clot.  Refill your prescription before you run out.  After discharge, you should have regular check-up appointments with your healthcare provider that is prescribing your Eliquis.    What do you do if you miss a dose? If a dose of ELIQUIS is not taken at the scheduled time, take it as soon as possible on the same day and twice-daily administration should be resumed. The dose should not be doubled to make up for a missed dose.  Important Safety Information A possible side effect of Eliquis is bleeding. You should call your healthcare provider right away if you experience any of the following: Bleeding from an injury or your nose that does not stop. Unusual colored urine (red or dark brown) or unusual colored stools (red or black). Unusual bruising for unknown reasons. A serious fall or if you hit your head (even if there is no bleeding).  Some  medicines may interact with Eliquis and might increase your risk of bleeding or clotting while on Eliquis. To help avoid this, consult your healthcare provider or pharmacist prior to using any new prescription or non-prescription medications, including herbals, vitamins, non-steroidal anti-inflammatory drugs (NSAIDs) and supplements.  This website has more information on Eliquis (apixaban): http://www.eliquis.com/eliquis/home

## 2024-03-17 NOTE — Telephone Encounter (Signed)
 Pharmacy Patient Advocate Encounter  Insurance verification completed.    The patient is insured through [employer]. Patient has Medicare and is not eligible for a copay card, but may be able to apply for patient assistance or Medicare RX Payment Plan (Patient Must reach out to their plan, if eligible for payment plan), if available.    Ran test claim for Eliquis and the current 30 day co-pay is 177.64.   This test claim was processed through [facility]- copay amounts may vary at other pharmacies due to pharmacy/plan contracts, or as the patient moves through the different stages of their insurance plan.

## 2024-03-18 ENCOUNTER — Telehealth: Payer: Self-pay | Admitting: *Deleted

## 2024-03-18 ENCOUNTER — Telehealth: Payer: Self-pay | Admitting: Internal Medicine

## 2024-03-18 NOTE — Telephone Encounter (Signed)
 Dr Lupe Carney patient

## 2024-03-18 NOTE — Telephone Encounter (Signed)
 Copied from CRM [id number]. Topic: Clinical - Medication Question >> Mar 18, 2024  8:29 AM Fredrich Romans wrote: Reason for CRM: Patient was discharged with Eliquis  from hospital and wife isnt clear on dosage directions.She would like a call with clarification   Sig: Take as directed on package: start with two-5mg  tablets twice daily for 7 days until 03/23/24. On day 8 which is 03/24/24- switch to one-5mg  tablet twice daily.   Continue medication as prescribed till cardiology appt  [facility]

## 2024-03-18 NOTE — Telephone Encounter (Signed)
 Patient identification verified by 2 forms. Marilynn Rail, RN    Called and spoke to [employer] states:   -has questions about Eliquis Rx   -unsure if pt should take 10mg  Eliquis BID for two weeks   -would like to know when to be concerned about low BP   -this morning after Medications patients BP was 96/60  -typically patient checks BP every 2 hours   -this morning took: Metoprolol, Entresto and spironolactone  Elease Hashimoto denies:   -lightheaded   -dizziness   -LOC/fall  Informed Elease Hashimoto:   -Take Eliquis 10 mg (two tablets) twice a day for 7 days   -on 4/10 start Eliquis 5 mg (one tablet) twice a day   -Monitor patients symptoms after taking medications   -Check BP before medications and 1-2 hours after medication  -should hold BP medications is SBP <100   -message sent to Dr. Bjorn Pippin regarding BP concern and medication  Elease Hashimoto verbalized understanding, no questions at this time

## 2024-03-18 NOTE — Telephone Encounter (Signed)
  Pt c/o medication issue:  1. Name of Medication:   APIXABAN (ELIQUIS) VTE STARTER PACK (10MG  AND 5MG )    2. How are you currently taking this medication (dosage and times per day)?   Take as directed on package: start with two-5mg  tablets twice daily for 7 days until 03/23/24. On day 8 which is 03/24/24- switch to one-5mg  tablet twice daily.    3. Are you having a reaction (difficulty breathing--STAT)? No   4. What is your medication issue? The patient's wife would like clarification on the dosage for the patient's Eliquis after two weeks. Also, the patient's blood pressure this morning was 97/60, which she mentioned is a little lower than normal.

## 2024-03-19 ENCOUNTER — Telehealth: Payer: Self-pay | Admitting: Cardiothoracic Surgery

## 2024-03-19 NOTE — Telephone Encounter (Signed)
 Received a call tonight from wife that since discharge his BP has been low.  This AM, when he took his meds, BP was 102 systolic. Throughout the day, systolics have been 80-90s. He is asymptomatic.  Per dc summary list, he was discharged on metoprolol XL 50, spironolactone 12.5, and entresto BID.  Instructed: Tonight, hold Entresto. In AM, hold the spironolactone, take the metoprolol XL and Entresto, and hopefully his systolics will be high enough for him to resume Entresto BID. If this works, continue to hold the spironolactone until his FU visit with PCP.  Eyvonne Left, MD Cardiology Overnight Coverage Service.

## 2024-03-21 NOTE — Telephone Encounter (Signed)
 Patient identification verified by 2 forms. Marilynn Rail, RN    Called and spoke to patients wife Max [name] states:   -called in on Saturday due to BP concerns   -is following provider recommendations and holding spironolactone   -Patients BP this morning 120/78 Advised Max [name] to continue monitor BP and symptoms, follow up at 4/14 Max [name] agrees with plan, no further questions at this time

## 2024-03-22 ENCOUNTER — Ambulatory Visit (INDEPENDENT_AMBULATORY_CARE_PROVIDER_SITE_OTHER): Admitting: Family Medicine

## 2024-03-22 VITALS — BP 122/78 | HR 90 | Temp 98.6°F | Ht 72.0 in | Wt 205.4 lb

## 2024-03-22 DIAGNOSIS — I5020 Unspecified systolic (congestive) heart failure: Secondary | ICD-10-CM | POA: Diagnosis not present

## 2024-03-22 DIAGNOSIS — I480 Paroxysmal atrial fibrillation: Secondary | ICD-10-CM | POA: Diagnosis not present

## 2024-03-22 DIAGNOSIS — I2699 Other pulmonary embolism without acute cor pulmonale: Secondary | ICD-10-CM

## 2024-03-22 DIAGNOSIS — I10 Essential (primary) hypertension: Secondary | ICD-10-CM

## 2024-03-22 LAB — COMPREHENSIVE METABOLIC PANEL WITH GFR
ALT: 30 U/L (ref 0–53)
AST: 23 U/L (ref 0–37)
Albumin: 4.6 g/dL (ref 3.5–5.2)
Alkaline Phosphatase: 76 U/L (ref 39–117)
BUN: 26 mg/dL — ABNORMAL HIGH (ref 6–23)
CO2: 22 meq/L (ref 19–32)
Calcium: 9.4 mg/dL (ref 8.4–10.5)
Chloride: 105 meq/L (ref 96–112)
Creatinine, Ser: 1.17 mg/dL (ref 0.40–1.50)
GFR: 61.84 mL/min (ref >60.00)
Glucose, Bld: 101 mg/dL — ABNORMAL HIGH (ref 70–99)
Potassium: 5 meq/L (ref 3.5–5.1)
Sodium: 136 meq/L (ref 135–145)
Total Bilirubin: 0.9 mg/dL (ref 0.2–1.2)
Total Protein: 7.5 g/dL (ref 6.0–8.3)

## 2024-03-22 LAB — CBC
HCT: 48.2 % (ref 39.0–52.0)
Hemoglobin: 16.1 g/dL (ref 13.0–17.0)
MCHC: 33.4 g/dL (ref 30.0–36.0)
MCV: 106.2 fl — ABNORMAL HIGH (ref 78.0–100.0)
Platelets: 391 10*3/uL (ref 150.0–400.0)
RBC: 4.54 Mil/uL (ref 4.22–5.81)
RDW: 15.3 % (ref 11.5–15.5)
WBC: 5.8 10*3/uL (ref 4.0–10.5)

## 2024-03-22 NOTE — Assessment & Plan Note (Signed)
 Blood pressure at goal today.  They will continue holding his spironolactone.  Continue Metroprolol succinate 50 mg daily and Entresto 24-26 twice daily.

## 2024-03-22 NOTE — Patient Instructions (Signed)
 It was very nice to see you today!  I am glad that you are feeling better.  Will check blood work today.  Please keep your appoint with cardiology.  Return if symptoms worsen or fail to improve.   Take care, Dr Jimmey Ralph  PLEASE NOTE:  If you had any lab tests, please let us know if you have not heard back within a few days. You may see your results on mychart before we have a chance to review them but we will give you a call once they are reviewed by [place].   If we ordered any referrals today, please let us know if you have not heard from their office within the next week.   If you had any urgent prescriptions sent in today, please check with the pharmacy within an hour of our visit to make sure the prescription was transmitted appropriately.   Please try these tips to maintain a healthy lifestyle:  Eat at least 3 REAL meals and 1-2 snacks per day.  Aim for no more than 5 hours between eating.  If you eat breakfast, please do so within one hour of getting up.   Each meal should contain half fruits/vegetables, one quarter protein, and one quarter carbs (no bigger than a computer mouse)  Cut down on sweet beverages. This includes juice, soda, and sweet tea.   Drink at least 1 glass of water with each meal and aim for at least 8 glasses per day  Exercise at least 150 minutes every week.

## 2024-03-22 NOTE — Progress Notes (Signed)
 Max [name] is a 73 y.o. male who presents today for an office visit.  Assessment/Plan:  Chronic Problems Addressed Today: Acute pulmonary embolism (HCC) Had a  lengthy discussion with patient and wife today regarding his recent hospitalization for PE and A-fib with RVR.  Overall he feels well today and is back to baseline.  He was started on anticoagulation with Eliquis.  Given that his PE has no obvious precipitating is worse would be reasonable for him to continue indefinite anticoagulation at this point.  Additionally this would be a good idea due to his comorbid atrial fibrillation as well.  Paroxysmal atrial fibrillation (HCC) Doing well today.  He is rate controlled on metoprolol succinate 50 mg daily.  He was additionally started on Entresto, spironolactone, and Jardiance in the hospital.  Currently holding spironolactone due to low blood pressure readings several days ago.  They will continue his current regimen for now and follow-up with cardiology next week.  Essential hypertension Blood pressure at goal today.  They will continue holding his spironolactone.  Continue Metroprolol succinate 50 mg daily and Entresto 24-26 twice daily.  HFrEF (heart failure with reduced ejection fraction) (HCC) New diagnosis.  Likely secondary to recent episode of A-fib with RVR and bilateral PE.  Recent echocardiogram showed EF of 30 to 35%.  He is currently on Entresto, spironolactone, metoprolol, and Jardiance as above.  Will defer further management to cardiology.     Subjective:  HPI:  See A/P for status of chronic conditions.  Patient is here today for hospital follow-up.  Patient was admitted to the hospital on 03/15/2024 due to PE and A-fib with RVR.  Initially presented after progressive shortness of breath, palpitations, and generalized weakness.  In the ED workup was notable for bilateral PE as well as A-fib with RVR.  Cardiology was consulted.  He was started on heparin.  He underwent  further cardiac workup which showed EF of 30 to 35%.  His A-fib was initially treated with Cardizem drip however this was transitioned to metoprolol prior to discharge.  For his new diagnosis of HFrEF he was also started on Entresto, spironolactone, and Jardiance.  For his PE he was transitioned to Eliquis prior to discharge.  He has been home for the last several days and is doing well.  No concerns today.  He did accidentally get a full dose of spironolactone at 25 mg daily as opposed to 12.5 mg daily.  He was having some low blood pressure in the last few days but this is since stabilized.  They did discuss this with cardiology who recommended that he hold spironolactone until he can get back in and see them.  He is tolerating his new medications well without side effects.       Objective:  Physical Exam: BP 122/78   Pulse 90   Temp 98.6 F (37 C) (Temporal)   Ht 6' (1.829 m)   Wt 205 lb 6.4 oz (93.2 kg)   SpO2 96%   BMI 27.86 kg/m   Gen: No acute distress, resting comfortably CV: Irregular rate and rhythm with no murmurs appreciated Pulm: Normal work of breathing, clear to auscultation bilaterally with no crackles, wheezes, or rhonchi Neuro: Grossly normal, moves all extremities Psych: Normal affect and thought content  Time Spent: 45 minutes of total time was spent on the date of the encounter performing the following actions: chart review prior to seeing the patient including recent ED visit, obtaining history, performing a medically necessary exam,  counseling on the treatment plan, placing orders, and documenting in our EHR.        Katina Degree. Jimmey Ralph, MD 03/22/2024 12:44 PM

## 2024-03-22 NOTE — Assessment & Plan Note (Signed)
 Doing well today.  He is rate controlled on metoprolol succinate 50 mg daily.  He was additionally started on Entresto, spironolactone, and Jardiance in the hospital.  Currently holding spironolactone due to low blood pressure readings several days ago.  They will continue his current regimen for now and follow-up with cardiology next week.

## 2024-03-22 NOTE — Assessment & Plan Note (Signed)
 Had a  lengthy discussion with patient and wife today regarding his recent hospitalization for PE and A-fib with RVR.  Overall he feels well today and is back to baseline.  He was started on anticoagulation with Eliquis.  Given that his PE has no obvious precipitating is worse would be reasonable for him to continue indefinite anticoagulation at this point.  Additionally this would be a good idea due to his comorbid atrial fibrillation as well.

## 2024-03-22 NOTE — Assessment & Plan Note (Signed)
 New diagnosis.  Likely secondary to recent episode of A-fib with RVR and bilateral PE.  Recent echocardiogram showed EF of 30 to 35%.  He is currently on Entresto, spironolactone, metoprolol, and Jardiance as above.  Will defer further management to cardiology.

## 2024-03-24 ENCOUNTER — Encounter: Payer: Self-pay | Admitting: Family Medicine

## 2024-03-24 ENCOUNTER — Telehealth: Payer: Self-pay | Admitting: Internal Medicine

## 2024-03-24 NOTE — Progress Notes (Signed)
 Labs are all stable.  We can recheck at next office visit.

## 2024-03-24 NOTE — Telephone Encounter (Signed)
 Patient and patient's wife is calling to follow up.

## 2024-03-24 NOTE — Telephone Encounter (Signed)
error 

## 2024-03-25 ENCOUNTER — Other Ambulatory Visit (HOSPITAL_COMMUNITY): Payer: Self-pay

## 2024-03-25 MED ORDER — METOPROLOL SUCCINATE ER 25 MG PO TB24
25.0000 mg | ORAL_TABLET | Freq: Every day | ORAL | 0 refills | Status: DC
  Filled 2024-03-25: qty 30, 30d supply, fill #0

## 2024-03-27 NOTE — Progress Notes (Deleted)
 Cardiology Office Note    Patient Name: Max [name] Date of Encounter: 03/27/2024  Primary Care Provider:  Rodney Clamp, MD Primary Cardiologist:  None Primary Electrophysiologist: None   Past Medical History    Past Medical History:  Diagnosis Date   Abdominal hernia 11/28/2015   Allergic rhinitis    Anxiety    BPV (benign positional vertigo), right 05/11/2018   Cardiac arrhythmia    ED (erectile dysfunction)    Herpes    History of syncope    in the setting of anxiety per cardiology note   Hypertension    Kidney tumor (benign), right    sx 02/27/2015   Meningioma (HCC)    had surgery    Paroxysmal atrial fibrillation (HCC) 07/15/2012   a) CHADSVASc score 1b) On full-dose ASA and Verapamil   RA (rheumatoid arthritis) (HCC)    Seizures (HCC) 05/14/2011   "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 male with a PMH of HFrEF, bilateral PE, paroxysmal AF, HTN, RA, s/p meningioma resection 2012, pulmonary nodule, seizures, anxiety who presents today for posthospital follow-up.  Max [name] was seen on 03/16/2024 with complaint of palpitations and progressive shortness of breath over the past week.  He underwent workup that showed elevated D-dimer BNP of 387 and troponin of 7.  CT angio of the chest showed bilateral PE within the upper and lower lobe segmental branches with pulmonary nodules and EKG showed AF with RVR.  Completed showing EF of 30-35% with grade 1 DD and mild MR flattened interventricular septum consistent with RV pressure overload. He was started on IV heparin and IV Cardizem and was started on PE treatment dose of DOAC with plan for outpatient DCCV following 3 weeks of uninterrupted AC.  He was started on GDMT with Entresto, Jardiance, metoprolol, and was given IV Lasix for mild volume overload.  He was discharged in stable condition with rate better controlled and BP  stable.  Patient denies chest pain, palpitations, dyspnea, PND, orthopnea, nausea, vomiting, dizziness, syncope, edema, weight gain, or early satiety.   Discussed the use of AI scribe software for clinical note transcription with the patient, who gave verbal consent to proceed.  History of Present Illness    ***Notes: -Last ischemic evaluation:  Review of Systems  Please see the history of present illness.    All other systems reviewed and are otherwise negative except as noted above.  Physical Exam    Wt Readings from Last 3 Encounters:  03/22/24 205 lb 6.4 oz (93.2 kg)  03/17/24 204 lb 6.4 oz (92.7 kg)  03/15/24 210 lb (95.3 kg)   RU:EAVWU were no vitals filed for this visit.,There is no height or weight on file to calculate BMI. GEN: Well nourished, well developed in no acute distress Neck: No JVD; No carotid bruits Pulmonary: Clear to auscultation without rales, wheezing or rhonchi  Cardiovascular: Normal rate. Regular rhythm. Normal S1. Normal S2.   Murmurs: There is no murmur.  ABDOMEN: Soft, non-tender, non-distended EXTREMITIES:  No edema; No deformity   EKG/LABS/ Recent Cardiac Studies   ECG personally reviewed by me today - ***  Risk Assessment/Calculations:   {Does this patient have ATRIAL FIBRILLATION?[phone]}      Lab Results  Component Value Date   WBC 5.8 03/22/2024   HGB 16.1 03/22/2024   HCT 48.2 03/22/2024   MCV 106.2 (H) 03/22/2024  PLT 391.0 03/22/2024   Lab Results  Component Value Date   CREATININE 1.17 03/22/2024   BUN 26 (H) 03/22/2024   NA 136 03/22/2024   K 5.0 03/22/2024   CL 105 03/22/2024   CO2 22 03/22/2024   Lab Results  Component Value Date   CHOL 140 03/16/2024   HDL 49 03/16/2024   LDLCALC 82 03/16/2024   LDLDIRECT 130.2 12/11/2011   TRIG 43 03/16/2024   CHOLHDL 2.9 03/16/2024    Lab Results  Component Value Date   HGBA1C 5.5 03/16/2024   Assessment & Plan    1.  Paroxysmal AF:  2.  HFrEF  3.   Bilateral PE  4.  Essential hypertension  5.  Hyperlipidemia      Disposition: Follow-up with None or APP in *** months {Are you ordering a CV Procedure (e.g. stress test, cath, DCCV, TEE, etc)?   Press F2        :[id number]}   Signed, Max [name], Max Cast, NP 03/27/2024, 2:54 PM [facility]

## 2024-03-28 ENCOUNTER — Ambulatory Visit: Admitting: Nurse Practitioner

## 2024-03-28 ENCOUNTER — Ambulatory Visit: Admitting: Emergency Medicine

## 2024-03-28 DIAGNOSIS — I10 Essential (primary) hypertension: Secondary | ICD-10-CM

## 2024-03-28 DIAGNOSIS — I480 Paroxysmal atrial fibrillation: Secondary | ICD-10-CM

## 2024-03-28 DIAGNOSIS — E785 Hyperlipidemia, unspecified: Secondary | ICD-10-CM

## 2024-03-28 DIAGNOSIS — I2699 Other pulmonary embolism without acute cor pulmonale: Secondary | ICD-10-CM

## 2024-03-28 DIAGNOSIS — I5020 Unspecified systolic (congestive) heart failure: Secondary | ICD-10-CM

## 2024-03-30 ENCOUNTER — Ambulatory Visit: Attending: Physician Assistant | Admitting: Physician Assistant

## 2024-03-30 ENCOUNTER — Encounter: Payer: Self-pay | Admitting: Physician Assistant

## 2024-03-30 ENCOUNTER — Other Ambulatory Visit: Payer: Self-pay | Admitting: Physician Assistant

## 2024-03-30 VITALS — BP 136/90 | HR 67 | Ht 72.0 in | Wt 201.0 lb

## 2024-03-30 DIAGNOSIS — I5022 Chronic systolic (congestive) heart failure: Secondary | ICD-10-CM

## 2024-03-30 DIAGNOSIS — I4819 Other persistent atrial fibrillation: Secondary | ICD-10-CM

## 2024-03-30 DIAGNOSIS — I10 Essential (primary) hypertension: Secondary | ICD-10-CM | POA: Diagnosis not present

## 2024-03-30 DIAGNOSIS — Z01818 Encounter for other preprocedural examination: Secondary | ICD-10-CM | POA: Diagnosis not present

## 2024-03-30 DIAGNOSIS — I2699 Other pulmonary embolism without acute cor pulmonale: Secondary | ICD-10-CM | POA: Diagnosis not present

## 2024-03-30 MED ORDER — APIXABAN 5 MG PO TABS: 5.0000 mg | ORAL_TABLET | Freq: Two times a day (BID) | ORAL | 3 refills | Status: AC

## 2024-03-30 MED ORDER — METOPROLOL SUCCINATE ER 50 MG PO TB24: 50.0000 mg | ORAL_TABLET | Freq: Every day | ORAL | 3 refills | Status: DC

## 2024-03-30 NOTE — Progress Notes (Signed)
 Cardiology Office Note:  .   Date:  03/30/2024  ID:  Max [name], DOB [birth year], MRN [id number] PCP: Rodney Clamp, MD   [facility] Providers Cardiologist:  Euell Herrlich, MD     History of Present Illness: .   Max [name] is a 73 y.o. male with past medical history of HFrEF, bilateral PE, PAF, hypertension, RA, s/p meningioma resection 2012, pulmonary nodule, seizure and anxiety.  He was previously seen by cardiology service in August 2013 during admission for atypical chest pain, syncope and a new onset of atrial fibrillation.  Syncope was felt to be vasovagal mediated.  Echocardiogram at the time showed EF 65 to 70% with grade 2 DD, mild MR.  Myoview showed no reversible ischemia.  He was not started on anticoagulation therapy given CHA2DS2-Vasc score of 1 at the time.  He was sent to [facility] ED on 03/15/2024 from rheumatologist office for further evaluation of tachycardia.  On arrival, he was in A-fib with RVR with heart rate of 141, nonspecific T wave changes.  Serial troponin was negative x 2.  D-dimer was elevated, BNP was 387.  Respiratory panel negative for COVID, influenza or RSV.  CTA of the chest showed bilateral PE within the upper and lower lobe segmental branch with pulmonary nodules.  EKG showed A-fib with RVR.  Echocardiogram showed EF 30 to 35% with grade 1 DD, mild MR, flattened interventricular septum consistent with RV pressure overload.  He was started on IV heparin and IV Cardizem and treated with DOAC.  The plan is for outpatient DCCV after 3 weeks of uninterrupted anticoagulation therapy.  He was started on GDMT in the hospital including spironolactone, Toprol-XL and Entresto.  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  Patient presents today for follow-up.  He remained in A-fib with  RVR with a heart rate of 115 bpm.  After his discharge, he has came off of Entresto and spironolactone due to low blood pressure down to the 80s.  Blood pressure has since came up, given persistently elevated heart rate, I will increase metoprolol succinate to 50 mg daily.  Unfortunately he was only taking Eliquis daily for the past few days as he misunderstood the instruction, he will start taking Eliquis twice a day from now.  We will push his outpatient cardioversion scheduled to 04/28/2024 with Dr. Acharya.  I plan to see the patient back 2 weeks after the cardioversion, he can follow-up with Dr. Acharya in 4 months.  When I do see him back, if he is maintaining sinus rhythm, I will begin to titrate his GDMT further prior to scheduling echocardiogram in 3 months.  Otherwise, he appears to be euvolemic on exam and denies any lower extremity edema, orthopnea or PND.  ROS:   He denies chest pain, palpitations, dyspnea, pnd, orthopnea, n, v, dizziness, syncope, edema, weight gain, or early satiety. All other systems reviewed and are otherwise negative except as noted above.    Studies Reviewed: Aaron Aas   EKG Interpretation Date/Time:  Wednesday March 30 2024 10:02:40 EDT Ventricular Rate:  115 PR Interval:    QRS Duration:  100 QT Interval:  318 QTC Calculation: 439 R Axis:   92  Text Interpretation: Atrial fibrillation with rapid ventricular response Rightward axis Nonspecific ST and T wave abnormality When compared with ECG of 15-Mar-2024 09:59,  PREVIOUS ECG IS PRESENT Confirmed by Shanise Balch [phone]) on 03/30/2024 10:11:18 PM     Risk Assessment/Calculations:    CHA2DS2-VASc Score = 4   This indicates a 4.8% annual risk of stroke. The patient's score is based upon: CHF History: 1 HTN History: 1 Diabetes History: 0 Stroke History: 0 Vascular Disease History: 1 Age Score: 1 Gender Score: 0           Physical Exam:   VS:  BP (!) 136/90 (BP Location: Right Arm, Patient Position: Sitting,  Cuff Size: Normal)   Pulse 67 Comment: 115 on EKG  Ht 6' (1.829 m)   Wt 201 lb (91.2 kg)   SpO2 97%   BMI 27.26 kg/m    Wt Readings from Last 3 Encounters:  03/30/24 201 lb (91.2 kg)  03/22/24 205 lb 6.4 oz (93.2 kg)  03/17/24 204 lb 6.4 oz (92.7 kg)    GEN: Well nourished, well developed in no acute distress NECK: No JVD; No carotid bruits CARDIAC: Irregularly irregular, no murmurs, rubs, gallops RESPIRATORY:  Clear to auscultation without rales, wheezing or rhonchi  ABDOMEN: Soft, non-tender, non-distended EXTREMITIES:  No edema; No deformity   ASSESSMENT AND PLAN: .    Persistent atrial fibrillation with RVR: Heart rate remaining elevated at 115.  Will increase metoprolol succinate to 50 mg daily for better rate control.  He recently misunderstood the medication instructions and has been taking Eliquis daily, he will start taking it twice a day instead.  Plan for outpatient DC cardioversion by Dr. Chancy Comber on 5/15 after uninterrupted anticoagulation therapy for 3 weeks.  Standard blood work including CBC and basic metabolic panel prior to the cardioversion.  Recent PE: Given PE dosing of Eliquis, now has went down to 1 tablet twice a day.  Unfortunately he has only been taking it daily for the past few days, will change it to twice a day dosing instead.  Chronic systolic heart failure: Recent echocardiogram showed EF of 30 to 35%, in the setting of A-fib with RVR and acute PE.  Once he is able to maintain sinus rhythm for 47-month, we will plan for repeat echocardiogram which likely will show improved EF.  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  Hypertension: Blood pressure stable.  He was hypotensive after his recent discharge, both of spironolactone and Entresto has been discontinued.  I will increase his metoprolol succinate for better rate control.  Once he cardioverted back to sinus rhythm, I will bring him back and titrate GDMT again prior to repeat echocardiogram in 3  months.       Informed Consent   Shared Decision Making/Informed Consent The risks (stroke, cardiac arrhythmias rarely resulting in the need for a temporary or permanent pacemaker, skin irritation or burns and complications associated with conscious sedation including aspiration, arrhythmia, respiratory failure and death), benefits (restoration of normal sinus rhythm) and alternatives of a direct current cardioversion were explained in detail to Max [name] and he agrees to proceed.       Dispo: Follow-up 2 weeks after the outpatient cardioversion.  Signed, Ervin Heath, PA

## 2024-03-30 NOTE — Patient Instructions (Signed)
 Medication Instructions:  INCREASE METOPROLOL SUCCINATE TO 50 MG DAILY *If you need a refill on your cardiac medications before your next appointment, please call your pharmacy*  Lab Work: BMET AND CBC IN 2-3 WEEKS If you have labs (blood work) drawn today and your tests are completely normal, you will receive your results only by: MyChart Message (if you have MyChart) OR A paper copy in the mail If you have any lab test that is abnormal or we need to change your treatment, we will call you to review the results.  Testing/Procedures: See below     Follow-Up: At [facility], you and your health needs are our priority.  As part of our continuing mission to provide you with exceptional heart care, our providers are all part of one team.  This team includes your primary Cardiologist (physician) and Advanced Practice Providers or APPs (Physician Assistants and Nurse Practitioners) who all work together to provide you with the care you need, when you need it.  Your next appointment:   2 week(s) after cardioversion  Provider:   Azalee Course, PA-C      Then, Weston Brass, MD will plan to see you again in 4 week(s).    Other Instructions      You are scheduled for a Cardioversion on Thursday, May 15 with Dr. Jacques Navy.    Please arrive at the [facility] (Main Entrance A) at [facility]: [address] [place], [place] [address] at 6:30 AM (This time is 1 hour(s) before your procedure to ensure your preparation).   Free valet parking service is available. You will check in at ADMITTING.   *Please Note: You will receive a call the day before your procedure to confirm the appointment time. That time may have changed from the original time based on the schedule for that day.*    DIET:  Nothing to eat or drink after midnight except a sip of water with medications (see medication instructions below)  HOLD: Empagliflozin (Jardiance) for 3 days prior to the procedure. Last  dose on Sunday, May 11.   Continue taking your anticoagulant (blood thinner): Apixaban (Eliquis).  You will need to continue this after your procedure until you are told by your provider that it is safe to stop.    LABS: to be done in 2-3 weeks.   FYI:  For your safety, and to allow [place] to monitor your vital signs accurately during the surgery/procedure we request: If you have artificial nails, gel coating, SNS etc, please have those removed prior to your surgery/procedure. Not having the nail coverings /polish removed may result in cancellation or delay of your surgery/procedure.  Your support person will be asked to wait in the waiting room during your procedure.  It is OK to have someone drop you off and come back when you are ready to be discharged.  You cannot drive after the procedure and will need someone to drive you home.  Bring your insurance cards.  *Special Note: Every effort is made to have your procedure done on time. Occasionally there are emergencies that occur at the hospital that may cause delays. Please be patient if a delay does occur.        1st Floor: - Lobby - Registration  - Pharmacy  - Lab - Cafe  2nd Floor: - PV Lab - Diagnostic Testing (echo, CT, nuclear med)  3rd Floor: - Vacant  4th Floor: - TCTS (cardiothoracic surgery) - AFib Clinic - Structural Heart Clinic -  Vascular Surgery  - Vascular Ultrasound  5th Floor: - HeartCare Cardiology (general and EP) - Clinical Pharmacy for coumadin, hypertension, lipid, weight-loss medications, and med management appointments    Valet parking services will be available as well.

## 2024-03-30 NOTE — H&P (View-Only) (Signed)
 Cardiology Office Note:  .   Date:  03/30/2024  ID:  Max [name], DOB [birth year], MRN [id number] PCP: Rodney Clamp, MD   [facility] Providers Cardiologist:  Euell Herrlich, MD     History of Present Illness: .   Max [name] is a 73 y.o. male with past medical history of HFrEF, bilateral PE, PAF, hypertension, RA, s/p meningioma resection 2012, pulmonary nodule, seizure and anxiety.  He was previously seen by cardiology service in August 2013 during admission for atypical chest pain, syncope and a new onset of atrial fibrillation.  Syncope was felt to be vasovagal mediated.  Echocardiogram at the time showed EF 65 to 70% with grade 2 DD, mild MR.  Myoview showed no reversible ischemia.  He was not started on anticoagulation therapy given CHA2DS2-Vasc score of 1 at the time.  [facility] was sent [facility] on 03/15/2024 from rheumatologist office for further evaluation of tachycardia.  On arrival, he was in A-fib with RVR with heart rate of 141, nonspecific T wave changes.  Serial troponin was negative x 2.  D-dimer was elevated, BNP was 387.  Respiratory panel negative for COVID, influenza or RSV.  CTA of the chest showed bilateral PE within the upper and lower lobe segmental branch with pulmonary nodules.  EKG showed A-fib with RVR.  Echocardiogram showed EF 30 to 35% with grade 1 DD, mild MR, flattened interventricular septum consistent with RV pressure overload.  He was started on IV heparin and IV Cardizem and treated with DOAC.  The plan is for outpatient DCCV after 3 weeks of uninterrupted anticoagulation therapy.  He was started on GDMT in the hospital including spironolactone, Toprol-XL and Entresto.  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  Patient presents today for follow-up.  He remained in A-fib with  RVR with a heart rate of 115 bpm.  After his discharge, he has came off of Entresto and spironolactone due to low blood pressure down to the 80s.  Blood pressure has since came up, given persistently elevated heart rate, I will increase metoprolol succinate to 50 mg daily.  Unfortunately he was only taking Eliquis daily for the past few days as he misunderstood the instruction, he will start taking Eliquis twice a day from now.  We will push his outpatient cardioversion scheduled to 04/28/2024 with Dr. Acharya.  I plan to see the patient back 2 weeks after the cardioversion, he can follow-up with Dr. Acharya in 4 months.  When I do see him back, if he is maintaining sinus rhythm, I will begin to titrate his GDMT further prior to scheduling echocardiogram in 3 months.  Otherwise, he appears to be euvolemic on exam and denies any lower extremity edema, orthopnea or PND.  ROS:   He denies chest pain, palpitations, dyspnea, pnd, orthopnea, n, v, dizziness, syncope, edema, weight gain, or early satiety. All other systems reviewed and are otherwise negative except as noted above.    Studies Reviewed: Aaron Aas   EKG Interpretation Date/Time:  Wednesday March 30 2024 10:02:40 EDT Ventricular Rate:  115 PR Interval:    QRS Duration:  100 QT Interval:  318 QTC Calculation: 439 R Axis:   92  Text Interpretation: Atrial fibrillation with rapid ventricular response Rightward axis Nonspecific ST and T wave abnormality When compared with ECG of 15-Mar-2024 09:59,  PREVIOUS ECG IS PRESENT Confirmed by Shanise Balch [phone]) on 03/30/2024 10:11:18 PM     Risk Assessment/Calculations:    CHA2DS2-VASc Score = 4   This indicates a 4.8% annual risk of stroke. The patient's score is based upon: CHF History: 1 HTN History: 1 Diabetes History: 0 Stroke History: 0 Vascular Disease History: 1 Age Score: 1 Gender Score: 0           Physical Exam:   VS:  BP (!) 136/90 (BP Location: Right Arm, Patient Position: Sitting,  Cuff Size: Normal)   Pulse 67 Comment: 115 on EKG  Ht 6' (1.829 m)   Wt 201 lb (91.2 kg)   SpO2 97%   BMI 27.26 kg/m    Wt Readings from Last 3 Encounters:  03/30/24 201 lb (91.2 kg)  03/22/24 205 lb 6.4 oz (93.2 kg)  03/17/24 204 lb 6.4 oz (92.7 kg)    GEN: Well nourished, well developed in no acute distress NECK: No JVD; No carotid bruits CARDIAC: Irregularly irregular, no murmurs, rubs, gallops RESPIRATORY:  Clear to auscultation without rales, wheezing or rhonchi  ABDOMEN: Soft, non-tender, non-distended EXTREMITIES:  No edema; No deformity   ASSESSMENT AND PLAN: .    Persistent atrial fibrillation with RVR: Heart rate remaining elevated at 115.  Will increase metoprolol succinate to 50 mg daily for better rate control.  He recently misunderstood the medication instructions and has been taking Eliquis daily, he will start taking it twice a day instead.  Plan for outpatient DC cardioversion by Dr. Chancy Comber on 5/15 after uninterrupted anticoagulation therapy for 3 weeks.  Standard blood work including CBC and basic metabolic panel prior to the cardioversion.  Recent PE: Given PE dosing of Eliquis, now has went down to 1 tablet twice a day.  Unfortunately he has only been taking it daily for the past few days, will change it to twice a day dosing instead.  Chronic systolic heart failure: Recent echocardiogram showed EF of 30 to 35%, in the setting of A-fib with RVR and acute PE.  Once he is able to maintain sinus rhythm for 47-month, we will plan for repeat echocardiogram which likely will show improved EF.  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  Hypertension: Blood pressure stable.  He was hypotensive after his recent discharge, both of spironolactone and Entresto has been discontinued.  I will increase his metoprolol succinate for better rate control.  Once he cardioverted back to sinus rhythm, I will bring him back and titrate GDMT again prior to repeat echocardiogram in 3  months.       Informed Consent   Shared Decision Making/Informed Consent The risks (stroke, cardiac arrhythmias rarely resulting in the need for a temporary or permanent pacemaker, skin irritation or burns and complications associated with conscious sedation including aspiration, arrhythmia, respiratory failure and death), benefits (restoration of normal sinus rhythm) and alternatives of a direct current cardioversion were explained in detail to Mr. Travieso and he agrees to proceed.       Dispo: Follow-up 2 weeks after the outpatient cardioversion.  Signed, Ervin Heath, PA

## 2024-03-31 NOTE — Telephone Encounter (Signed)
 RN called patient' phone listed. The number was to his wife, Devra Fontana. Mrs Campanelli states she was the one who sent the [employer].   RN explained to Mrs Jentz the deductible that patient has to pay  until the price of Eliquis go down. Eliquis is a 3rd tier medication. RN recommend she contact her medicare supplement insurance  company to get details. Any other questions she reach back out the office.   Wife voiced understanding

## 2024-04-01 ENCOUNTER — Other Ambulatory Visit (HOSPITAL_COMMUNITY): Payer: Self-pay

## 2024-04-04 ENCOUNTER — Telehealth: Payer: Self-pay | Admitting: *Deleted

## 2024-04-04 NOTE — Telephone Encounter (Signed)
 LVMTCB to schedule 4 mo f/u visit with Dr. Chancy Comber (4 months after seeing Hao Meng, PA on 05/13/2024); pt would be due to be seen at end of September, 2025.

## 2024-04-11 MED ORDER — EMPAGLIFLOZIN 10 MG PO TABS: 10.0000 mg | ORAL_TABLET | Freq: Every day | ORAL | 3 refills | Status: AC

## 2024-04-13 ENCOUNTER — Other Ambulatory Visit: Payer: Self-pay | Admitting: Physician Assistant

## 2024-04-13 NOTE — Telephone Encounter (Signed)
 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

## 2024-04-14 ENCOUNTER — Encounter: Payer: Self-pay | Admitting: *Deleted

## 2024-04-21 DIAGNOSIS — Z01818 Encounter for other preprocedural examination: Secondary | ICD-10-CM | POA: Diagnosis not present

## 2024-04-22 LAB — BASIC METABOLIC PANEL WITH GFR
BUN/Creatinine Ratio: 18 (ref 10–24)
BUN: 19 mg/dL (ref 8–27)
CO2: 21 mmol/L (ref 20–29)
Calcium: 9.3 mg/dL (ref 8.6–10.2)
Chloride: 105 mmol/L (ref 96–106)
Creatinine, Ser: 1.05 mg/dL (ref 0.76–1.27)
Glucose: 93 mg/dL (ref 70–99)
Potassium: 4.9 mmol/L (ref 3.5–5.2)
Sodium: 141 mmol/L (ref 134–144)
eGFR: 75 mL/min/{1.73_m2} (ref >59)

## 2024-04-22 LAB — CBC
Hematocrit: 43.1 % (ref 37.5–51.0)
Hemoglobin: 14.7 g/dL (ref 13.0–17.7)
MCH: 35 pg — ABNORMAL HIGH (ref 26.6–33.0)
MCHC: 34.1 g/dL (ref 31.5–35.7)
MCV: 103 fL — ABNORMAL HIGH (ref 79–97)
Platelets: 342 10*3/uL (ref 150–450)
RBC: 4.2 x10E6/uL (ref 4.14–5.80)
RDW: 13.8 % (ref 11.6–15.4)
WBC: 5.5 10*3/uL (ref 3.4–10.8)

## 2024-04-26 ENCOUNTER — Ambulatory Visit: Payer: Self-pay

## 2024-04-27 NOTE — Progress Notes (Signed)
 Spoke to patient and instructed them to come at 0630  and to be NPO after 0000.  Medications reviewed.    Confirmed that patient will have a ride home and someone to stay with them for 24 hours after the procedure.

## 2024-04-28 ENCOUNTER — Other Ambulatory Visit: Payer: Self-pay

## 2024-04-28 ENCOUNTER — Encounter (HOSPITAL_COMMUNITY): Payer: Self-pay | Admitting: Internal Medicine

## 2024-04-28 ENCOUNTER — Ambulatory Visit (HOSPITAL_COMMUNITY)
Admission: RE | Admit: 2024-04-28 | Discharge: 2024-04-28 | Disposition: A | Discharge status: Home / Self Care | Attending: Internal Medicine | Admitting: Internal Medicine

## 2024-04-28 ENCOUNTER — Encounter (HOSPITAL_COMMUNITY)
Admission: RE | Disposition: A | Discharge status: Home / Self Care | Payer: Self-pay | Source: Home / Self Care | Attending: Internal Medicine

## 2024-04-28 ENCOUNTER — Ambulatory Visit (HOSPITAL_COMMUNITY): Admitting: Anesthesiology

## 2024-04-28 DIAGNOSIS — F419 Anxiety disorder, unspecified: Secondary | ICD-10-CM | POA: Diagnosis not present

## 2024-04-28 DIAGNOSIS — I110 Hypertensive heart disease with heart failure: Secondary | ICD-10-CM

## 2024-04-28 DIAGNOSIS — Z7901 Long term (current) use of anticoagulants: Secondary | ICD-10-CM | POA: Insufficient documentation

## 2024-04-28 DIAGNOSIS — E785 Hyperlipidemia, unspecified: Secondary | ICD-10-CM | POA: Diagnosis not present

## 2024-04-28 DIAGNOSIS — I4819 Other persistent atrial fibrillation: Secondary | ICD-10-CM | POA: Insufficient documentation

## 2024-04-28 DIAGNOSIS — Z79899 Other long term (current) drug therapy: Secondary | ICD-10-CM | POA: Insufficient documentation

## 2024-04-28 DIAGNOSIS — I4891 Unspecified atrial fibrillation: Secondary | ICD-10-CM | POA: Diagnosis not present

## 2024-04-28 DIAGNOSIS — I5022 Chronic systolic (congestive) heart failure: Secondary | ICD-10-CM | POA: Insufficient documentation

## 2024-04-28 DIAGNOSIS — I5020 Unspecified systolic (congestive) heart failure: Secondary | ICD-10-CM

## 2024-04-28 HISTORY — PX: CARDIOVERSION: EP1203

## 2024-04-28 SURGERY — CARDIOVERSION (CATH LAB)
Anesthesia: General

## 2024-04-28 MED ORDER — SODIUM CHLORIDE 0.9% FLUSH: 3.0000 mL | Freq: Two times a day (BID) | INTRAVENOUS | Status: DC

## 2024-04-28 MED ORDER — PROPOFOL 10 MG/ML IV BOLUS
INTRAVENOUS | Status: DC | PRN
  Administered 2024-04-28: 50 mg via INTRAVENOUS

## 2024-04-28 MED ORDER — LIDOCAINE 2% (20 MG/ML) 5 ML SYRINGE
INTRAMUSCULAR | Status: DC | PRN
  Administered 2024-04-28: 20 mg via INTRAVENOUS

## 2024-04-28 MED ORDER — SODIUM CHLORIDE 0.9% FLUSH
3.0000 mL | INTRAVENOUS | Status: DC | PRN
  Administered 2024-04-28: 10 mL via INTRAVENOUS

## 2024-04-28 SURGICAL SUPPLY — 1 items: PAD DEFIB RADIO PHYSIO CONN (PAD) ×1 IMPLANT

## 2024-04-28 NOTE — CV Procedure (Signed)
 Procedure: Electrical Cardioversion Indications:  Atrial Fibrillation  Procedure Details:  Consent: Risks of procedure as well as the alternatives and risks of each were explained to the (patient/caregiver).  Consent for procedure obtained.  Time Out: Verified patient identification, verified procedure, site/side was marked, verified correct patient position, special equipment/implants available, medications/allergies/relevent history reviewed, required imaging and test results available. PERFORMED.  Patient placed on cardiac monitor, pulse oximetry, supplemental oxygen as necessary.  Sedation given: propofol  per anesthesia Pacer pads placed anterior and posterior chest.  Cardioverted 1 time(s).  Cardioversion with synchronized biphasic 200J shock.  Evaluation: Findings: Post procedure EKG shows: sinus bradycardia Complications: None Patient did tolerate procedure well.  Time Spent Directly with the Patient:  15 minutes   Carolle Ishii A Krithi Bray 04/28/2024, 7:50 AM

## 2024-04-28 NOTE — Discharge Instructions (Signed)

## 2024-04-28 NOTE — Anesthesia Preprocedure Evaluation (Signed)
 Anesthesia Evaluation  Patient identified by MRN, date of birth, ID band Patient awake    Reviewed: Allergy & Precautions, NPO status , Patient's Chart, lab work & pertinent test results  Airway Mallampati: II  TM Distance: >3 FB Neck ROM: Full    Dental  (+) Dental Advisory Given   Pulmonary neg pulmonary ROS   breath sounds clear to auscultation       Cardiovascular hypertension, Pt. on medications and Pt. on home beta blockers +CHF (EF 30-35%)  + dysrhythmias Atrial Fibrillation  Rhythm:Irregular Rate:Normal     Neuro/Psych Seizures -,     GI/Hepatic negative GI ROS, Neg liver ROS,,,  Endo/Other  negative endocrine ROS    Renal/GU Renal disease     Musculoskeletal  (+) Arthritis ,    Abdominal   Peds  Hematology negative hematology ROS (+)   Anesthesia Other Findings   Reproductive/Obstetrics                             Anesthesia Physical Anesthesia Plan  ASA: 3  Anesthesia Plan: General   Post-op Pain Management:    Induction: Intravenous  PONV Risk Score and Plan: 2 and Treatment may vary due to age or medical condition  Airway Management Planned: Mask, Natural Airway and Nasal Cannula  Additional Equipment:   Intra-op Plan:   Post-operative Plan:   Informed Consent: I have reviewed the patients History and Physical, chart, labs and discussed the procedure including the risks, benefits and alternatives for the proposed anesthesia with the patient or authorized representative who has indicated his/her understanding and acceptance.       Plan Discussed with:   Anesthesia Plan Comments:        Anesthesia Quick Evaluation

## 2024-04-28 NOTE — Interval H&P Note (Signed)
 History and Physical Interval Note:  04/28/2024 7:42 AM  Max [name]  has presented today for surgery, with the diagnosis of AFIB.  The various methods of treatment have been discussed with the patient and family. After consideration of risks, benefits and other options for treatment, the patient has consented to  Procedure(s): CARDIOVERSION (N/A) as a surgical intervention.  The patient's history has been reviewed, patient examined, no change in status, stable for surgery.  I have reviewed the patient's chart and labs.  Questions were answered to the patient's satisfaction.     Cali Cuartas A Caidin Heidenreich

## 2024-04-28 NOTE — Transfer of Care (Signed)
 Immediate Anesthesia Transfer of Care Note  Patient: Larrie Po  Procedure(s) Performed: CARDIOVERSION  Patient Location: Cath Lab  Anesthesia Type:General  Level of Consciousness: drowsy and responds to stimulation  Airway & Oxygen Therapy: Patient Spontanous Breathing and Patient connected to nasal cannula oxygen  Post-op Assessment: Report given to RN, Post -op Vital signs reviewed and stable, and Patient moving all extremities X 4  Post vital signs: Reviewed and stable  Last Vitals:  Vitals Value Taken Time  BP 129/78 04/28/24 0750  Temp    Pulse 51 04/28/24 0750  Resp 16 04/28/24 0750  SpO2 98 % 04/28/24 0750  Vitals shown include unfiled device data.  Last Pain:  Vitals:   04/28/24 0705  TempSrc: Temporal  PainSc: 0-No pain         Complications: No notable events documented.

## 2024-04-30 NOTE — Anesthesia Postprocedure Evaluation (Signed)
 Anesthesia Post Note  Patient: Max [name]  Procedure(s) Performed: CARDIOVERSION     Patient location during evaluation: PACU Anesthesia Type: General Level of consciousness: awake and alert Pain management: pain level controlled Vital Signs Assessment: post-procedure vital signs reviewed and stable Respiratory status: spontaneous breathing, nonlabored ventilation, respiratory function stable and patient connected to nasal cannula oxygen Cardiovascular status: blood pressure returned to baseline and stable Postop Assessment: no apparent nausea or vomiting Anesthetic complications: no   No notable events documented.  Last Vitals:  Vitals:   04/28/24 0812 04/28/24 0815  BP:  121/76  Pulse: (!) 52 (!) 53  Resp: 18 19  Temp:  (!) 36.3 C  SpO2: 97% 96%    Last Pain:  Vitals:   04/28/24 0815  TempSrc: Temporal  PainSc: 0-No pain   Pain Goal:                   Melvenia Stabs

## 2024-05-13 ENCOUNTER — Encounter: Payer: Self-pay | Admitting: Physician Assistant

## 2024-05-13 ENCOUNTER — Ambulatory Visit: Attending: Physician Assistant | Admitting: Physician Assistant

## 2024-05-13 VITALS — BP 138/86 | HR 61 | Ht 72.0 in | Wt 202.4 lb

## 2024-05-13 DIAGNOSIS — I2699 Other pulmonary embolism without acute cor pulmonale: Secondary | ICD-10-CM

## 2024-05-13 DIAGNOSIS — I5020 Unspecified systolic (congestive) heart failure: Secondary | ICD-10-CM

## 2024-05-13 DIAGNOSIS — I4819 Other persistent atrial fibrillation: Secondary | ICD-10-CM

## 2024-05-13 DIAGNOSIS — I10 Essential (primary) hypertension: Secondary | ICD-10-CM

## 2024-05-13 MED ORDER — METOPROLOL SUCCINATE ER 50 MG PO TB24: 50.0000 mg | ORAL_TABLET | Freq: Two times a day (BID) | ORAL | 3 refills | Status: DC

## 2024-05-13 NOTE — Patient Instructions (Signed)
 Medication Instructions:  INCREASE METOPROLOL  SUCCINATE TO 50 MG TWICE A DAY *If you need a refill on your cardiac medications before your next appointment, please call your pharmacy*  Lab Work: NO LABS If you have labs (blood work) drawn today and your tests are completely normal, you will receive your results only by: MyChart Message (if you have MyChart) OR A paper copy in the mail If you have any lab test that is abnormal or we need to change your treatment, we will call you to review the results.  Testing/Procedures: NO TESTING  Follow-Up: At [facility], you and your health needs are our priority.  As part of our continuing mission to provide you with exceptional heart care, our providers are all part of one team.  This team includes your primary Cardiologist (physician) and Advanced Practice Providers or APPs (Physician Assistants and Nurse Practitioners) who all work together to provide you with the care you need, when you need it.  Your next appointment:   KEEP FOLLOW UP SEPTEMBER 2025  Provider:   Euell Herrlich, MD   Other Instructions You have been referred to [facility]

## 2024-05-13 NOTE — Progress Notes (Signed)
 Cardiology Office Note   Date:  05/13/2024  ID:  [name], Max 1951/06/03, MRN [id number] PCP: Rodney Clamp, MD  [facility] Providers Cardiologist:  Euell Herrlich, MD      History of Present Illness Max [name] is a 73 y.o. male  with past medical history of HFrEF, bilateral PE, PAF, hypertension, RA, s/p meningioma resection 2012, pulmonary nodule, seizure and anxiety. He was previously seen by cardiology service in August 2013 during admission for atypical chest pain, syncope and new onset of atrial fibrillation.  Syncope was felt to be vasovagal mediated.  Echocardiogram at the time showed EF 65 to 70% with grade 2 DD, mild MR.  Myoview  showed no reversible ischemia.  He was not started on anticoagulation therapy given CHA2DS2-Vasc score of 1 at the time.  He was sent to [facility] ED on 03/15/2024 from rheumatologist office for further evaluation of tachycardia.  On arrival, he was in A-fib with RVR with heart rate of 141, nonspecific T wave changes.  Serial troponin was negative x 2.  D-dimer was elevated, BNP was 387.  Respiratory panel negative for COVID, influenza or RSV.  CTA of the chest showed bilateral PE within the upper and lower lobe segmental branch with pulmonary nodules.  EKG showed A-fib with RVR.  Echocardiogram showed EF 30 to 35% with grade 1 DD, mild MR, flattened interventricular septum consistent with RV pressure overload.  He was started on IV heparin  and IV Cardizem  and treated with DOAC.  The plan is for outpatient DCCV after 3 weeks of uninterrupted anticoagulation therapy.  He was started on GDMT in the hospital including spironolactone , Toprol -XL and Entresto .  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   I last saw the patient on 03/30/2024, he was in atrial fibrillation with  a heart rate of 115 bpm.  I increased metoprolol  succinate to 50 mg daily.  He ultimately underwent successful cardioversion on 04/28/2024 with restoration of sinus rhythm.  Patient presents today for follow-up along with his wife.  EKG shows he is back in atrial fibrillation.  I will increase the metoprolol  succinate 50 mg to twice a day for better rate control throughout the day.  I suspect the patient will need antiarrhythmic therapy before considering cardioversion again.  Her previous echocardiogram showed moderately dilated left atrium.  Fortunately, he is well compensated and appears to be euvolemic.  He is on Jardiance  and metoprolol  succinate, I did not try to put him on ARB/ARNI or MRA as we need to potentially consider additional rate control medication in the future.  I recommend referral to A-fib clinic to consider antiarrhythmic therapy and repeat cardioversion.  If he is able to hold sinus rhythm for longer than 3 months, we will consider a repeat echocardiogram on the next follow-up in early September to see if the ejection fraction has improved.  If EF remain low, the patient will need an ischemic workup at that time.  Otherwise he denies any chest pain.  ROS:   He denies chest pain, palpitations, dyspnea, pnd, orthopnea, n, v, dizziness, syncope, edema, weight gain, or early satiety. All other systems reviewed and are otherwise negative except as noted above.    Studies Reviewed EKG Interpretation Date/Time:  Friday May 13 2024 11:34:31 EDT Ventricular Rate:  113 PR Interval:    QRS  Duration:  98 QT Interval:  340 QTC Calculation: 466 R Axis:   109  Text Interpretation: Atrial fibrillation with rapid ventricular response Rightward axis ST & T wave abnormality, consider inferior ischemia When compared with ECG of 28-Apr-2024 07:59, Atrial fibrillation has replaced Sinus rhythm Vent. rate has increased BY  64 BPM T wave inversion now evident in Inferior leads Nonspecific T wave  abnormality, improved in Lateral leads Confirmed by Ervin Heath [phone]) on 05/13/2024 11:29:37 PM    Cardiac Studies & Procedures   ______________________________________________________________________________________________   STRESS TESTS  NM MYOCAR MULTI W/SPECT W 07/16/2012  Narrative * *RADIOLOGY REPORT*  Clinical Data:  Chest pain  MYOCARDIAL IMAGING WITH SPECT (REST AND EXERCISE) GATED LEFT VENTRICULAR WALL MOTION STUDY LEFT VENTRICULAR EJECTION FRACTION  Technique:  Standard myocardial SPECT imaging was performed after resting intravenous injection of Tc-22m Myoview .  Subsequently, exercise tolerance test was performed by the patient under the supervision of the Cardiology staff.  At peak-stress, Tc-58m Myoview  was injected intravenously and standard myocardial SPECT imaging was performed.  Quantitative gated imaging was also performed to evaluate left ventricular wall motion, and estimate left ventricular ejection fraction.  Radiopharmaceutical:  Tc-23m Myoview  at rest and during stress.  Comparison: None  MYOCARDIAL IMAGING WITH SPECT (REST AND EXERCISE-STRESS)  Findings:  There are no abnormal areas of myocardial reversibility identified.  The summed difference score is equal to one.  GATED LEFT VENTRICULAR WALL MOTION STUDY  Findings:  Review of the gated images demonstrates  mild decreased motion involving the apical segment of the lateral wall.  Summed motion score is equal to 3.  Normal thickening.  LEFT VENTRICULAR EJECTION FRACTION  Findings:  QGS ejection fraction measures 64% , with an end- diastolic volume of 69 ml and an end-systolic volume of 25 ml.  IMPRESSION:  1.  No evidence for exercise-induced myocardial reversibility. 2.  Mild hypokinesis involving the apical segment of the lateral wall. 3.  Left ventricular ejection fraction equals 64%.  Original Report Authenticated By: Boby Bury, M.D.    ECHOCARDIOGRAM  ECHOCARDIOGRAM COMPLETE 03/15/2024  Narrative ECHOCARDIOGRAM REPORT    Patient Name:   KAILER [name] [facility] Date of Exam: 03/15/2024 Medical Rec #:  [id number]        Height:       72.0 in Accession #:    [id number]       Weight:       205.0 lb Date of Birth:  01-Sep-1951        BSA:          2.153 m Patient Age:    72 years         BP:           151/87 mmHg Patient Gender: M                HR:           98 bpm. Exam Location:  Inpatient  Procedure: 2D Echo, Cardiac Doppler and Color Doppler (Both Spectral and Color Flow Doppler were utilized during procedure).  Indications:    Pulmonary embolus  History:        Patient has prior history of Echocardiogram examinations, most recent 07/15/2012. Arrythmias:Atrial Fibrillation, Signs/Symptoms:Dyspnea and Syncope; Risk Factors:Hypertension.  Sonographer:    Juanita Shaw Referring Phys: [id number] ERIC J [place]  IMPRESSIONS   1. Left ventricular ejection fraction, by estimation, is 30 to 35%. The left ventricle has moderately decreased function. The left ventricle has no regional wall motion  abnormalities. Left ventricular diastolic parameters are consistent with Grade I diastolic dysfunction (impaired relaxation). There is the interventricular septum is flattened in systole, consistent with right ventricular pressure overload. 2. Reduced pulmonary acceleration time consistent with RV dysfunction. Right ventricular systolic function is mildly reduced. The right ventricular size is moderately enlarged. There is moderately elevated pulmonary artery systolic pressure. 3. Left atrial size was moderately dilated. 4. Right atrial size was mild to moderately dilated. 5. The mitral valve is normal in structure. Mild mitral valve regurgitation. No evidence of mitral stenosis. 6. The aortic valve is normal in structure. Aortic valve regurgitation is not visualized. No aortic stenosis is present. 7. The inferior vena cava is dilated in size  with <50% respiratory variability, suggesting right atrial pressure of 15 mmHg.  FINDINGS Left Ventricle: Left ventricular ejection fraction, by estimation, is 30 to 35%. The left ventricle has moderately decreased function. The left ventricle has no regional wall motion abnormalities. The left ventricular internal cavity size was normal in size. There is no left ventricular hypertrophy. The interventricular septum is flattened in systole, consistent with right ventricular pressure overload. Left ventricular diastolic parameters are consistent with Grade I diastolic dysfunction (impaired relaxation).  Right Ventricle: Reduced pulmonary acceleration time consistent with RV dysfunction. The right ventricular size is moderately enlarged. No increase in right ventricular wall thickness. Right ventricular systolic function is mildly reduced. There is moderately elevated pulmonary artery systolic pressure. The tricuspid regurgitant velocity is 3.31 m/s, and with an assumed right atrial pressure of 15 mmHg, the estimated right ventricular systolic pressure is 58.8 mmHg.  Left Atrium: Left atrial size was moderately dilated.  Right Atrium: Right atrial size was mild to moderately dilated.  Pericardium: There is no evidence of pericardial effusion.  Mitral Valve: The mitral valve is normal in structure. Mild mitral valve regurgitation. No evidence of mitral valve stenosis. MV peak gradient, 4.6 mmHg. The mean mitral valve gradient is 2.0 mmHg.  Tricuspid Valve: The tricuspid valve is normal in structure. Tricuspid valve regurgitation is not demonstrated. No evidence of tricuspid stenosis.  Aortic Valve: The aortic valve is normal in structure. Aortic valve regurgitation is not visualized. No aortic stenosis is present. Aortic valve mean gradient measures 1.7 mmHg. Aortic valve peak gradient measures 3.3 mmHg. Aortic valve area, by VTI measures 3.03 cm.  Pulmonic Valve: The pulmonic valve was normal in  structure. Pulmonic valve regurgitation is not visualized. No evidence of pulmonic stenosis.  Aorta: The aortic root is normal in size and structure.  Venous: The inferior vena cava is dilated in size with less than 50% respiratory variability, suggesting right atrial pressure of 15 mmHg.  IAS/Shunts: No atrial level shunt detected by color flow Doppler.   LEFT VENTRICLE PLAX 2D LVIDd:         5.40 cm LVIDs:         4.00 cm LV PW:         0.70 cm LV IVS:        0.90 cm LVOT diam:     1.90 cm LV SV:         43 LV SV Index:   20 LVOT Area:     2.84 cm  LV Volumes (MOD) LV vol d, MOD A2C: 181.0 ml LV vol d, MOD A4C: 167.0 ml LV vol s, MOD A2C: 63.8 ml LV vol s, MOD A4C: 71.9 ml LV SV MOD A2C:     117.2 ml LV SV MOD A4C:     167.0 ml  LV SV MOD BP:      112.6 ml  RIGHT VENTRICLE             IVC RV Basal diam:  4.80 cm     IVC diam: 2.10 cm RV Mid diam:    3.50 cm RV S prime:     11.70 cm/s TAPSE (M-mode): 2.0 cm  LEFT ATRIUM              Index        RIGHT ATRIUM           Index LA diam:        3.50 cm  1.63 cm/m   RA Area:     25.70 cm LA Vol (A2C):   72.8 ml  33.81 ml/m  RA Volume:   88.30 ml  41.01 ml/m LA Vol (A4C):   105.0 ml 48.77 ml/m LA Biplane Vol: 87.3 ml  40.55 ml/m AORTIC VALVE                    PULMONIC VALVE AV Area (Vmax):    2.66 cm     PV Vmax:          0.59 m/s AV Area (Vmean):   1.91 cm     PV Peak grad:     1.4 mmHg AV Area (VTI):     3.03 cm     PR End Diast Vel: 2.94 msec AV Vmax:           90.17 cm/s AV Vmean:          62.633 cm/s AV VTI:            0.142 m AV Peak Grad:      3.3 mmHg AV Mean Grad:      1.7 mmHg LVOT Vmax:         84.50 cm/s LVOT Vmean:        42.300 cm/s LVOT VTI:          0.152 m LVOT/AV VTI ratio: 1.07  AORTA Ao Root diam: 3.40 cm Ao Asc diam:  3.00 cm  MITRAL VALVE                 TRICUSPID VALVE MV Area (PHT): 4.12 cm      TR Peak grad:   43.8 mmHg MV Area VTI:   2.35 cm      TR Vmax:        331.00  cm/s MV Peak grad:  4.6 mmHg MV Mean grad:  2.0 mmHg      SHUNTS MV Vmax:       1.07 m/s      Systemic VTI:  0.15 m MV Vmean:      64.8 cm/s     Systemic Diam: 1.90 cm MV Decel Time: 184 msec MR Peak grad:   90.8 mmHg MR Mean grad:   57.0 mmHg MR Vmax:        476.50 cm/s MR Vmean:       351.5 cm/s MR PISA:        1.01 cm MR PISA Radius: 0.40 cm MV E velocity: 82.50 cm/s  Arta Lark Electronically signed by Arta Lark Signature Date/Time: 03/15/2024/4:08:17 PM    Final          ______________________________________________________________________________________________      Risk Assessment/Calculations  CHA2DS2-VASc Score = 4   This indicates a 4.8% annual risk of stroke. The patient's score is based upon: CHF History: 1 HTN History: 1  Diabetes History: 0 Stroke History: 0 Vascular Disease History: 1 Age Score: 1 Gender Score: 0            Physical Exam VS:  BP 138/86 (BP Location: Left Arm, Patient Position: Sitting, Cuff Size: Normal)   Pulse 61   Ht 6' (1.829 m)   Wt 202 lb 6.4 oz (91.8 kg)   SpO2 96%   BMI 27.45 kg/m    Wt Readings from Last 3 Encounters:  05/13/24 202 lb 6.4 oz (91.8 kg)  04/28/24 197 lb (89.4 kg)  03/30/24 201 lb (91.2 kg)    GEN: Well nourished, well developed in no acute distress NECK: No JVD; No carotid bruits CARDIAC: Irregularly irregular, no murmurs, rubs, gallops RESPIRATORY:  Clear to auscultation without rales, wheezing or rhonchi  ABDOMEN: Soft, non-tender, non-distended EXTREMITIES:  No edema; No deformity   ASSESSMENT AND PLAN  Persistent atrial fibrillation: Recently underwent cardioversion, however he has went back into atrial fibrillation based on today's EKG.  I will increase metoprolol  succinate to 50 mg twice a day for better rate control.  I will refer him to A-fib clinic for consideration of antiarrhythmic therapy before repeat cardioversion.  HFrEF: Once he is able to maintain sinus rhythm for  longer than 29-month, we plan to repeat echocardiogram, if EF remain low, he will need ischemic workup  History of PE: On Eliquis   Hypertension: Increase metoprolol  succinate to 50 mg twice a day for better rate control.       Dispo: Follow-up as scheduled  Signed, Kischa Altice, PA

## 2024-05-19 ENCOUNTER — Other Ambulatory Visit: Payer: Self-pay | Admitting: Rheumatology

## 2024-05-27 ENCOUNTER — Ambulatory Visit (HOSPITAL_COMMUNITY)
Admission: RE | Admit: 2024-05-27 | Discharge: 2024-05-27 | Disposition: A | Discharge status: Home / Self Care | Source: Ambulatory Visit | Attending: Internal Medicine | Admitting: Internal Medicine

## 2024-05-27 ENCOUNTER — Encounter (HOSPITAL_COMMUNITY): Payer: Self-pay | Admitting: *Deleted

## 2024-05-27 ENCOUNTER — Telehealth (HOSPITAL_COMMUNITY): Payer: Self-pay | Admitting: *Deleted

## 2024-05-27 VITALS — BP 132/80 | HR 87 | Ht 72.0 in | Wt 202.0 lb

## 2024-05-27 DIAGNOSIS — I4891 Unspecified atrial fibrillation: Secondary | ICD-10-CM

## 2024-05-27 DIAGNOSIS — D6869 Other thrombophilia: Secondary | ICD-10-CM

## 2024-05-27 DIAGNOSIS — I4819 Other persistent atrial fibrillation: Secondary | ICD-10-CM | POA: Diagnosis not present

## 2024-05-27 MED ORDER — AMIODARONE HCL 200 MG PO TABS: ORAL_TABLET | ORAL | 2 refills | Status: DC

## 2024-05-27 NOTE — Progress Notes (Signed)
 Primary Care Physician: Rodney Clamp, MD Primary Cardiologist: Euell Herrlich, MD Electrophysiologist: None     Referring Physician: Ervin Heath, PA-C     Max [name] is a 73 y.o. male with a history of HFrEF, bilateral PE, aortic atherosclerosis by CT imaging, HTN, RA, s/p meningioma resection 2012, pulmonary nodule, seizure, anxiety, and atrial fibrillation who presents for consultation in the [facility]. Seen by Cardiology noted to be in Afib on 4/16. S/p successful DCCV on 5/15; seen by Cardiology for follow up on 5/30 and noted to have ERAF. Currently taking Toprol  50 mg daily. Patient is on Eliquis  for a CHADS2VASC score of 4.  On evaluation today, he is currently in Afib. He does not have cardiac awareness. He is on methotrexate  for RA. No missed doses of Eliquis .    Today, he denies symptoms of palpitations, chest pain, shortness of breath, orthopnea, PND, lower extremity edema, dizziness, presyncope, syncope, snoring, daytime somnolence, bleeding, or neurologic sequela. The patient is tolerating medications without difficulties and is otherwise without complaint today.    he has a BMI of Body mass index is 27.4 kg/m.Aaron Aas Filed Weights   05/27/24 0828  Weight: 91.6 kg    Current Outpatient Medications  Medication Sig Dispense Refill   acyclovir  (ZOVIRAX ) 800 MG tablet Take 1 tablet (800 mg total) by mouth 3 (three) times daily as needed. For outbreaks 60 tablet 1   apixaban  (ELIQUIS ) 5 MG TABS tablet Take 1 tablet (5 mg total) by mouth 2 (two) times daily. 180 tablet 3   APIXABAN  (ELIQUIS ) VTE STARTER PACK (10MG  AND 5MG ) Take as directed on package: start with two-5mg  tablets twice daily for 7 days until 03/23/24. On day 8 which is 03/24/24- switch to one-5mg  tablet twice daily. 74 each 0   Ascorbic Acid  (VITAMIN C ) 1000 MG tablet Take 1,000 mg by mouth daily.     Cholecalciferol (VITAMIN D3) 1000 units CAPS Take 1,000 Units by mouth at  bedtime.     Coenzyme Q10 (CO Q 10 PO) Take 1 capsule by mouth daily after supper.     empagliflozin  (JARDIANCE ) 10 MG TABS tablet Take 1 tablet (10 mg total) by mouth daily. 90 tablet 3   folic acid  (FOLVITE ) 1 MG tablet Take 2 tablets (2 mg total) by mouth daily. 180 tablet 3   methotrexate  (RHEUMATREX) 2.5 MG tablet TAKE 8 TABLETS (20 MG TOTAL) BY MOUTH ONCE A WEEK. CAUTION:CHEMOTHERAPY. PROTECT FROM LIGHT. 96 tablet 0   metoprolol  succinate (TOPROL -XL) 50 MG 24 hr tablet Take 1 tablet (50 mg total) by mouth 2 (two) times daily. Take with or immediately following a meal. 180 tablet 3   Multiple Vitamins-Minerals (CENTRUM SILVER 50+MEN) TABS Take 1 tablet by mouth daily with breakfast.     Probiotic Product (PROBIOTIC PO) Take 1 capsule by mouth in the morning.     No current facility-administered medications for this encounter.    Atrial Fibrillation Management history:  Previous antiarrhythmic drugs: none Previous cardioversions: 04/28/24 Previous ablations: none Anticoagulation history: Eliquis    ROS- All systems are reviewed and negative except as per the HPI above.  Physical Exam: BP 132/80   Pulse 87   Ht 6' (1.829 m)   Wt 91.6 kg   BMI 27.40 kg/m   GEN: Well nourished, well developed in no acute distress NECK: No JVD; No carotid bruits CARDIAC: Irregularly irregular rate and rhythm, no murmurs, rubs, gallops RESPIRATORY:  Clear to auscultation without rales, wheezing  or rhonchi  ABDOMEN: Soft, non-tender, non-distended EXTREMITIES:  No edema; No deformity   EKG today demonstrates  Vent. rate 87 BPM PR interval * ms QRS duration 100 ms QT/QTcB 368/442 ms P-R-T axes * 108 -51 Atrial fibrillation Rightward axis ST & T wave abnormality, consider lateral ischemia Abnormal ECG When compared with ECG of 13-May-2024 11:34, No significant change was found  Echo 03/15/24 demonstrated  1. Left ventricular ejection fraction, by estimation, is 30 to 35%. The  left  ventricle has moderately decreased function. The left ventricle has  no regional wall motion abnormalities. Left ventricular diastolic  parameters are consistent with Grade I  diastolic dysfunction (impaired relaxation). There is the interventricular  septum is flattened in systole, consistent with right ventricular pressure  overload.   2. Reduced pulmonary acceleration time consistent with RV dysfunction.  Right ventricular systolic function is mildly reduced. The right  ventricular size is moderately enlarged. There is moderately elevated  pulmonary artery systolic pressure.   3. Left atrial size was moderately dilated.   4. Right atrial size was mild to moderately dilated.   5. The mitral valve is normal in structure. Mild mitral valve  regurgitation. No evidence of mitral stenosis.   6. The aortic valve is normal in structure. Aortic valve regurgitation is  not visualized. No aortic stenosis is present.   7. The inferior vena cava is dilated in size with <50% respiratory  variability, suggesting right atrial pressure of 15 mmHg.   ASSESSMENT & PLAN CHA2DS2-VASc Score = 4  The patient's score is based upon: CHF History: 1 HTN History: 1 Diabetes History: 0 Stroke History: 0 Vascular Disease History: 1 Age Score: 1 Gender Score: 0       ASSESSMENT AND PLAN: Persistent Atrial Fibrillation (ICD10:  I48.19) The patient's CHA2DS2-VASc score is 4, indicating a 4.8% annual risk of stroke.    He is currently in Afib. We had a long discussion about medication treatments and ablation in detail. We discussed potential options such as Tikosyn and amiodarone. We talked about the monitoring required for these medications, hospital admission for Tikosyn, and potential adverse effects. We talked about amiodarone as a bridge to ablation. We also discussed pulse field ablation as an option in the form of intervention. After discussion, patient is interested will discuss options with wife and  contact us  with decision.  PR 174 ms Qtc in NSR 388 ms CrCl 81 mL/min from [facility] on 5/8.   Secondary Hypercoagulable State (ICD10:  D68.69) The patient is at significant risk for stroke/thromboembolism based upon his CHA2DS2-VASc Score of 4.  Continue Apixaban  (Eliquis ).  No missed doses of Eliquis .     Patient will call clinic with treatment decision.   Minnie Amber, PA-C  [facility] [address] [place], [place] [address] [phone]

## 2024-05-27 NOTE — Telephone Encounter (Signed)
 Pt would like to proceed with Amiodarone and ablation after office visit today discussed plan with Caesar Caster P.A. he also agrees to move forward with this plan. Instructed to start Amiodarone 200mg  bid x 30 days then 200mg  daily. Also referred to EP for consult. Reminded not to miss any dose of Eliquis  and follow up appointment made for 3 weeks.

## 2024-06-23 ENCOUNTER — Ambulatory Visit (HOSPITAL_COMMUNITY): Payer: Self-pay | Admitting: Internal Medicine

## 2024-06-23 ENCOUNTER — Ambulatory Visit (HOSPITAL_COMMUNITY)
Admission: RE | Admit: 2024-06-23 | Discharge: 2024-06-23 | Disposition: A | Discharge status: Home / Self Care | Source: Ambulatory Visit | Attending: Internal Medicine | Admitting: Internal Medicine

## 2024-06-23 VITALS — BP 144/86 | HR 77 | Ht 72.0 in | Wt 205.2 lb

## 2024-06-23 DIAGNOSIS — D6869 Other thrombophilia: Secondary | ICD-10-CM

## 2024-06-23 DIAGNOSIS — I4819 Other persistent atrial fibrillation: Secondary | ICD-10-CM | POA: Diagnosis not present

## 2024-06-23 DIAGNOSIS — I4891 Unspecified atrial fibrillation: Secondary | ICD-10-CM

## 2024-06-23 DIAGNOSIS — Z79899 Other long term (current) drug therapy: Secondary | ICD-10-CM | POA: Diagnosis not present

## 2024-06-23 DIAGNOSIS — Z5181 Encounter for therapeutic drug level monitoring: Secondary | ICD-10-CM

## 2024-06-23 LAB — CBC
Hematocrit: 43.7 % (ref 37.5–51.0)
Hemoglobin: 14.4 g/dL (ref 13.0–17.7)
MCH: 34.4 pg — ABNORMAL HIGH (ref 26.6–33.0)
MCHC: 33 g/dL (ref 31.5–35.7)
MCV: 105 fL — ABNORMAL HIGH (ref 79–97)
Platelets: 299 x10E3/uL (ref 150–450)
RBC: 4.18 x10E6/uL (ref 4.14–5.80)
RDW: 14.8 % (ref 11.6–15.4)
WBC: 4.6 x10E3/uL (ref 3.4–10.8)

## 2024-06-23 LAB — BASIC METABOLIC PANEL WITH GFR
BUN/Creatinine Ratio: 17 (ref 10–24)
BUN: 20 mg/dL (ref 8–27)
CO2: 27 mmol/L (ref 20–29)
Calcium: 8.9 mg/dL (ref 8.6–10.2)
Chloride: 106 mmol/L (ref 96–106)
Creatinine, Ser: 1.19 mg/dL (ref 0.76–1.27)
Glucose: 109 mg/dL — ABNORMAL HIGH (ref 70–99)
Potassium: 4.9 mmol/L (ref 3.5–5.2)
Sodium: 142 mmol/L (ref 134–144)
eGFR: 64 mL/min/1.73 (ref >59)

## 2024-06-23 NOTE — Patient Instructions (Addendum)
 Do NOT take Jardiance  after 06/27/24 Monday, may resume after procedure  Decrease Amiodarone  to ONCE daily starting 07/01/24 Friday   Cardioversion scheduled for: Friday 07/01/24   - Arrive at the [facility] ([address])  and check in with ADMITTING at 8:00am   - Do not eat or drink anything after midnight the night prior to your procedure.   - Take all your morning medication (except diabetic medications) with a sip of water  prior to arrival.  - Do NOT miss any doses of your blood thinner - if you should miss a dose or take a dose more than 4 hours late -- please notify our office immediately.  - You will not be able to drive home after your procedure. Please ensure you have a responsible adult to drive you home. You will need someone with you for 24 hours post procedure.     - Expect to be in the procedural area approximately 2 hours.   - If you feel as if you go back into normal rhythm prior to scheduled cardioversion, please notify our office immediately.   If your procedure is canceled in the cardioversion suite you will be charged a cancellation fee.      Hold below medications 72 hours prior to scheduled procedure/anesthesia. Restart medication on the following day after scheduled procedure/anesthesia  Empagliflozin  (Jardiance )     For those patients who have a scheduled procedure/anesthesia on the same day of the week as their dose, hold the medication on the day of surgery.  They can take their scheduled dose the week before.  **Patients on the above medications scheduled for elective procedures that have not held the medication for the appropriate amount of time are at risk of cancellation or change in the anesthetic plan.

## 2024-06-23 NOTE — Progress Notes (Signed)
 Primary Care Physician: Kennyth Worth HERO, MD Primary Cardiologist: Soyla DELENA Merck, MD Electrophysiologist: None     Referring Physician: Scot Ford, PA-C     Max [name] is a 73 y.o. male with a history of HFrEF, bilateral PE, aortic atherosclerosis by CT imaging, HTN, RA, s/p meningioma resection 2012, pulmonary nodule, seizure, anxiety, and atrial fibrillation who presents for consultation in the [facility]. Seen by Cardiology noted to be in Afib on 4/16. S/p successful DCCV on 5/15; seen by Cardiology for follow up on 5/30 and noted to have ERAF. Currently taking Toprol  50 mg daily. Patient is on Eliquis  for a CHADS2VASC score of 4.  On evaluation today, he is currently in Afib. He does not have cardiac awareness. He is on methotrexate  for RA. No missed doses of Eliquis .   On follow up 06/23/24, he is currently in Afib. He began amiodarone  load at last OV on 6/13. No missed doses of Eliquis .    Today, he denies symptoms of palpitations, chest pain, shortness of breath, orthopnea, PND, lower extremity edema, dizziness, presyncope, syncope, snoring, daytime somnolence, bleeding, or neurologic sequela. The patient is tolerating medications without difficulties and is otherwise without complaint today.    he has a BMI of Body mass index is 27.83 kg/m.SABRA Filed Weights   06/23/24 0831  Weight: 93.1 kg     Current Outpatient Medications  Medication Sig Dispense Refill   acyclovir  (ZOVIRAX ) 800 MG tablet Take 1 tablet (800 mg total) by mouth 3 (three) times daily as needed. For outbreaks (Patient taking differently: Take 800 mg by mouth as needed. For outbreaks) 60 tablet 1   amiodarone  (PACERONE ) 200 MG tablet Take 1 tablet (200 mg total) by mouth 2 (two) times daily for 30 days, THEN 1 tablet (200 mg total) daily. 60 tablet 2   apixaban  (ELIQUIS ) 5 MG TABS tablet Take 1 tablet (5 mg total) by mouth 2 (two) times daily. 180 tablet 3   Ascorbic Acid   (VITAMIN C ) 1000 MG tablet Take 1,000 mg by mouth daily.     Cholecalciferol (VITAMIN D3) 1000 units CAPS Take 1,000 Units by mouth at bedtime.     Coenzyme Q10 (CO Q 10 PO) Take 1 capsule by mouth daily after supper.     empagliflozin  (JARDIANCE ) 10 MG TABS tablet Take 1 tablet (10 mg total) by mouth daily. 90 tablet 3   folic acid  (FOLVITE ) 1 MG tablet Take 2 tablets (2 mg total) by mouth daily. 180 tablet 3   methotrexate  (RHEUMATREX) 2.5 MG tablet TAKE 8 TABLETS (20 MG TOTAL) BY MOUTH ONCE A WEEK. CAUTION:CHEMOTHERAPY. PROTECT FROM LIGHT. 96 tablet 0   metoprolol  succinate (TOPROL -XL) 50 MG 24 hr tablet Take 1 tablet (50 mg total) by mouth 2 (two) times daily. Take with or immediately following a meal. 180 tablet 3   Multiple Vitamins-Minerals (CENTRUM SILVER 50+MEN) TABS Take 1 tablet by mouth daily with breakfast.     Probiotic Product (PROBIOTIC PO) Take 1 capsule by mouth in the morning.     No current facility-administered medications for this encounter.    Atrial Fibrillation Management history:  Previous antiarrhythmic drugs: amiodarone  Previous cardioversions: 04/28/24 Previous ablations: none Anticoagulation history: Eliquis    ROS- All systems are reviewed and negative except as per the HPI above.  Physical Exam: BP (!) 144/86   Pulse 77   Ht 6' (1.829 m)   Wt 93.1 kg   BMI 27.83 kg/m   GEN- The patient  is well appearing, alert and oriented x 3 today.   Neck - no JVD or carotid bruit noted Lungs- Clear to ausculation bilaterally, normal work of breathing Heart- Irregular rate and rhythm, no murmurs, rubs or gallops, PMI not laterally displaced Extremities- no clubbing, cyanosis, or edema Skin - no rash or ecchymosis noted   EKG today demonstrates  Vent. rate 77 BPM PR interval * ms QRS duration 102 ms QT/QTcB 386/436 ms P-R-T axes * 114 205 Atrial fibrillation Left posterior fascicular block Nonspecific T wave abnormality Abnormal ECG When compared with  ECG of 27-May-2024 08:35, No significant change was found  Echo 03/15/24 demonstrated  1. Left ventricular ejection fraction, by estimation, is 30 to 35%. The  left ventricle has moderately decreased function. The left ventricle has  no regional wall motion abnormalities. Left ventricular diastolic  parameters are consistent with Grade I  diastolic dysfunction (impaired relaxation). There is the interventricular  septum is flattened in systole, consistent with right ventricular pressure  overload.   2. Reduced pulmonary acceleration time consistent with RV dysfunction.  Right ventricular systolic function is mildly reduced. The right  ventricular size is moderately enlarged. There is moderately elevated  pulmonary artery systolic pressure.   3. Left atrial size was moderately dilated.   4. Right atrial size was mild to moderately dilated.   5. The mitral valve is normal in structure. Mild mitral valve  regurgitation. No evidence of mitral stenosis.   6. The aortic valve is normal in structure. Aortic valve regurgitation is  not visualized. No aortic stenosis is present.   7. The inferior vena cava is dilated in size with <50% respiratory  variability, suggesting right atrial pressure of 15 mmHg.   ASSESSMENT & PLAN CHA2DS2-VASc Score = 4  The patient's score is based upon: CHF History: 1 HTN History: 1 Diabetes History: 0 Stroke History: 0 Vascular Disease History: 1 Age Score: 1 Gender Score: 0       ASSESSMENT AND PLAN: Persistent Atrial Fibrillation (ICD10:  I48.19) The patient's CHA2DS2-VASc score is 4, indicating a 4.8% annual risk of stroke.    He is currently in Afib. He began amiodarone  load at last OV on 6/13. He will transition to once daily amiodarone  on 7/18. We discussed the procedure cardioversion to try to convert to NSR. We discussed the risks vs benefits of this procedure and how ultimately we cannot predict whether a patient will have early return of  arrhythmia post procedure but will be less likely since on amiodarone . After discussion, the patient wishes to proceed with cardioversion. Labs drawn today.   Informed Consent   Shared Decision Making/Informed Consent The risks (stroke, cardiac arrhythmias rarely resulting in the need for a temporary or permanent pacemaker, skin irritation or burns and complications associated with conscious sedation including aspiration, arrhythmia, respiratory failure and death), benefits (restoration of normal sinus rhythm) and alternatives of a direct current cardioversion were explained in detail to Max [name] and he agrees to proceed.      High risk medication monitoring (ICD10: [id number]) Patient requires ongoing monitoring for anti-arrhythmic medication which has the potential to cause life threatening arrhythmias or AV block. Qtc stable. Continue amiodarone  200 mg BID and transition to 200 mg once daily on 7/18.  Secondary Hypercoagulable State (ICD10:  D68.69) The patient is at significant risk for stroke/thromboembolism based upon his CHA2DS2-VASc Score of 4.  Continue Apixaban  (Eliquis ).  No missed doses.     Follow up as scheduled with Dr. Kennyth after DCCV.  Terra Pac, PA-C  [facility] [address] [place], [place] [address] [phone]

## 2024-06-30 NOTE — Progress Notes (Signed)
 Spoke to patient and instructed them to come at 0800  and to be NPO after 0000.     Confirmed that patient will have a ride home and someone to stay with them for 24 hours after the procedure.  Confirmed blood thinner. Confirmed no breaks in taking blood thinner for 3+ weeks prior to procedure. Confirmed patient stopped all GLP-1s and GLP-2s for at least one week before procedure.

## 2024-07-01 ENCOUNTER — Encounter (HOSPITAL_COMMUNITY)
Admission: RE | Disposition: A | Discharge status: Home / Self Care | Payer: Self-pay | Source: Home / Self Care | Attending: Cardiology

## 2024-07-01 ENCOUNTER — Ambulatory Visit (HOSPITAL_COMMUNITY)
Admission: RE | Admit: 2024-07-01 | Discharge: 2024-07-01 | Disposition: A | Discharge status: Home / Self Care | Attending: Cardiology | Admitting: Cardiology

## 2024-07-01 DIAGNOSIS — I4819 Other persistent atrial fibrillation: Secondary | ICD-10-CM

## 2024-07-01 DIAGNOSIS — I4891 Unspecified atrial fibrillation: Secondary | ICD-10-CM | POA: Insufficient documentation

## 2024-07-01 DIAGNOSIS — Z539 Procedure and treatment not carried out, unspecified reason: Secondary | ICD-10-CM | POA: Insufficient documentation

## 2024-07-01 SURGERY — CARDIOVERSION (CATH LAB)
Anesthesia: Monitor Anesthesia Care

## 2024-07-01 NOTE — Progress Notes (Signed)
 Patient was scheduled for direct-current cardioversion this morning.  Preprocedural he was noted to be in sinus on telemetry.  EKG was performed which confirmed underlying rhythm to be sinus bradycardia.  Procedure was canceled.  Patient was discharged home and advised to continue the same medications including anticoagulation.  Will update referring provider Fairy Heinrich PA-C and primary cardiologist Dr. Loni.     No Charge.   Madonna Michele HAS, [facility]  A Division of [facility] [address] [place], [place] [address]  [place],  [address]

## 2024-07-08 DIAGNOSIS — H2513 Age-related nuclear cataract, bilateral: Secondary | ICD-10-CM | POA: Diagnosis not present

## 2024-07-08 DIAGNOSIS — H524 Presbyopia: Secondary | ICD-10-CM | POA: Diagnosis not present

## 2024-07-08 DIAGNOSIS — H43813 Vitreous degeneration, bilateral: Secondary | ICD-10-CM | POA: Diagnosis not present

## 2024-07-08 DIAGNOSIS — H5203 Hypermetropia, bilateral: Secondary | ICD-10-CM | POA: Diagnosis not present

## 2024-07-08 DIAGNOSIS — H25013 Cortical age-related cataract, bilateral: Secondary | ICD-10-CM | POA: Diagnosis not present

## 2024-07-08 DIAGNOSIS — H04123 Dry eye syndrome of bilateral lacrimal glands: Secondary | ICD-10-CM | POA: Diagnosis not present

## 2024-07-09 ENCOUNTER — Other Ambulatory Visit: Payer: Self-pay | Admitting: Rheumatology

## 2024-07-11 NOTE — Telephone Encounter (Signed)
 Last Fill: 04/13/2024  Labs: 06/23/2024 CBC and BMP Glucose 109 MCV 105 MCH 34.4  Next Visit: 08/18/2024  Last Visit: 03/15/2024  DX: Rheumatoid arthritis with rheumatoid factor of multiple sites without organ or systems involvement (HCC)   Current Dose per office note 03/15/2024: Methotrexate  8 tablets by mouth once weekly   Okay to refill Methotrexate ?

## 2024-07-11 NOTE — Progress Notes (Unsigned)
 Electrophysiology Office Note:   Date:  07/12/2024  ID:  NOX TALENT, DOB 06/02/1951, MRN [id number]  Primary Cardiologist: Soyla DELENA Merck, MD Electrophysiologist: Fonda Kitty, MD      History of Present Illness:   Max [name] is a 73 y.o. male with h/o chronic systolic heart failure, bilateral PE, aortic atherosclerosis by CT imaging, HTN, rheumatoid arthritis, meningioma s/p resection 2012, pulmonary nodule, seizure, anxiety, and atrial fibrillation who is being seen today for evaluation for catheter ablation.   Discussed the use of AI scribe software for clinical note transcription with the patient, who gave verbal consent to proceed.  History of Present Illness He is accompanied by his wife, Bruna. He was referred for evaluation of his atrial fibrillation.  Seen by Cardiology noted to be in Afib on 4/16. S/p successful DCCV on 5/15. Seen by Cardiology for follow up on 5/30 and noted to have ERAF. Started amiodarone  on 6/13. Repeat DCCV on 7/18.  He has maintained normal rhythm since last cardioversion on amiodarone .  He has not noticed a substantial improvement in his symptoms since being in normal rhythm, although his heart rates have been in the 40s, which may be limiting him to some extent.  He is taking both amiodarone  and metoprolol .  He is also taking Eliquis  for stroke prevention and denies any issues with this medication.  Currently denies any significant fatigue, shortness of breath, palpitations, dizziness, or lightheadedness. He is able to perform daily activities, including working every day and climbing stairs, without limitations.  No new or acute complaints today.   Review of systems complete and found to be negative unless listed in HPI.   EP Information / Studies Reviewed:    EKG is ordered today. Personal review as below.  EKG Interpretation Date/Time:  Tuesday July 12 2024 08:30:42 EDT Ventricular Rate:  44 PR Interval:  182 QRS Duration:  100 QT  Interval:  474 QTC Calculation: 405 R Axis:   90  Text Interpretation: Marked sinus bradycardia Nonspecific ST and T wave abnormality When compared with ECG of 01-Jul-2024 08:08, T wave inversion now evident in Anterior leads Confirmed by Kitty Fonda [phone]) on 07/12/2024 8:44:19 AM   EKG 05/27/24: AF    Echo 03/15/24:   1. Left ventricular ejection fraction, by estimation, is 30 to 35%. The  left ventricle has moderately decreased function. The left ventricle has  no regional wall motion abnormalities. Left ventricular diastolic  parameters are consistent with Grade I  diastolic dysfunction (impaired relaxation). There is the interventricular  septum is flattened in systole, consistent with right ventricular pressure  overload.   2. Reduced pulmonary acceleration time consistent with RV dysfunction.  Right ventricular systolic function is mildly reduced. The right  ventricular size is moderately enlarged. There is moderately elevated  pulmonary artery systolic pressure.   3. Left atrial size was moderately dilated.   4. Right atrial size was mild to moderately dilated.   5. The mitral valve is normal in structure. Mild mitral valve  regurgitation. No evidence of mitral stenosis.   6. The aortic valve is normal in structure. Aortic valve regurgitation is  not visualized. No aortic stenosis is present.   7. The inferior vena cava is dilated in size with <50% respiratory  variability, suggesting right atrial pressure of 15 mmHg.   Risk Assessment/Calculations:    CHA2DS2-VASc Score = 4   This indicates a 4.8% annual risk of stroke. The patient's score is based upon: CHF History: 1 HTN History:  1 Diabetes History: 0 Stroke History: 0 Vascular Disease History: 1 Age Score: 1 Gender Score: 0         Physical Exam:   VS:  BP (!) 142/60 (BP Location: Right Arm, Patient Position: Sitting, Cuff Size: Normal)   Pulse (!) 44   Ht 6' (1.829 m)   Wt 201 lb (91.2 kg)   SpO2 97%    BMI 27.26 kg/m    Wt Readings from Last 3 Encounters:  07/12/24 201 lb (91.2 kg)  06/23/24 205 lb 3.2 oz (93.1 kg)  05/27/24 202 lb (91.6 kg)     GEN: Well nourished, well developed in no acute distress NECK: No JVD CARDIAC: Bradycardic, regular RESPIRATORY:  Clear to auscultation without rales, wheezing or rhonchi  ABDOMEN: Soft, non-distended EXTREMITIES:  No edema; No deformity   ASSESSMENT AND PLAN:    #. Persistent atrial fibrillation: Associated with systolic heart failure. For this reason, we have prioritized a rhythm control strategy.  #. High risk medication use: Amiodarone .  TSH normal 03/2024.  LFTs normal 03/2024.  QTc on EKG today is normal. -Discussed treatment options today for AF including antiarrhythmic drug therapy and ablation. Discussed risks, recovery and likelihood of success with each treatment strategy. Risk, benefits, and alternatives to EP study and ablation for afib were discussed. These risks include but are not limited to stroke, bleeding, vascular damage, tamponade, perforation, damage to the esophagus, lungs, phrenic nerve and other structures, pulmonary vein stenosis, worsening renal function, coronary vasospasm and death.  Discussed potential need for repeat ablation procedures and antiarrhythmic drugs after an initial ablation. The patient understands these risk and wishes to proceed.  We will therefore proceed with catheter ablation at the next available time.  Carto, ICE, anesthesia are requested for the procedure.   -Continue amiodarone  as bridge to ablation.  -Decrease metoprolol  XL to 25mg  once daily d/t significant bradycardia.  We will leave on low-dose due to history of systolic dysfunction.  #. Secondary hypercoagulable state due to AF:  -Continue Eliquis  5mg  BID.   #. Sinus bradycardia: Likely secondary to amiodarone  and metoprolol .  - Decrease metoprolol  XL from 50mg  BID to 25mg  daily at night.   #. Chronic systolic heart failure: EF dropped in  setting of AF. Last had normal EF and normal nuclear stress in 2013. - Rhythm control strategy as above.  - Repeat echocardiogram in sinus after ablation.   Follow up with Dr. Kennyth 3 months after ablation.   Signed, Fonda Kennyth, MD

## 2024-07-12 ENCOUNTER — Ambulatory Visit: Attending: Internal Medicine | Admitting: Cardiology

## 2024-07-12 ENCOUNTER — Other Ambulatory Visit: Payer: Self-pay

## 2024-07-12 VITALS — BP 142/60 | HR 44 | Ht 72.0 in | Wt 201.0 lb

## 2024-07-12 DIAGNOSIS — I10 Essential (primary) hypertension: Secondary | ICD-10-CM | POA: Diagnosis not present

## 2024-07-12 DIAGNOSIS — D6869 Other thrombophilia: Secondary | ICD-10-CM

## 2024-07-12 DIAGNOSIS — I5022 Chronic systolic (congestive) heart failure: Secondary | ICD-10-CM | POA: Diagnosis not present

## 2024-07-12 DIAGNOSIS — R001 Bradycardia, unspecified: Secondary | ICD-10-CM | POA: Diagnosis not present

## 2024-07-12 DIAGNOSIS — I4819 Other persistent atrial fibrillation: Secondary | ICD-10-CM

## 2024-07-12 DIAGNOSIS — Z79899 Other long term (current) drug therapy: Secondary | ICD-10-CM | POA: Diagnosis not present

## 2024-07-12 MED ORDER — METOPROLOL SUCCINATE ER 25 MG PO TB24: 25.0000 mg | ORAL_TABLET | Freq: Every day | ORAL | 3 refills | Status: DC

## 2024-07-12 MED ORDER — AMIODARONE HCL 200 MG PO TABS: 200.0000 mg | ORAL_TABLET | Freq: Every day | ORAL | 3 refills | Status: DC

## 2024-07-12 NOTE — Patient Instructions (Addendum)
 Medication Instructions:  Your physician has recommended you make the following change in your medication:  1) DECREASE Toprol  XL (metoprolol  succinate) 25 mg once every evening.  *If you need a refill on your cardiac medications before your next appointment, please call your pharmacy*  Testing/Procedures: Ablation Your physician has recommended that you have an ablation. Catheter ablation is a medical procedure used to treat some cardiac arrhythmias (irregular heartbeats). During catheter ablation, a long, thin, flexible tube is put into a blood vessel in your groin (upper thigh), or neck. This tube is called an ablation catheter. It is then guided to your heart through the blood vessel. Radio frequency waves destroy small areas of heart tissue where abnormal heartbeats may cause an arrhythmia to start.   We will contact you to schedule your ablation  What To Expect:  Labs: you will need to have lab work drawn within 30 days of your procedure. Please go to any [facility] location to have these drawn - no appointment is needed. You will receive procedure instructions either through MyChart or in the mail 4-6 week prior to your procedure.  After your procedure we recommend no driving for 3 days, no lifting over 10 lbs for 5 days, and no work or strenuous activity for 7 days.  Please contact our office at [phone] if you have any questions.    Follow-Up: We will contact you to schedule your post-procedure appointments.

## 2024-07-21 ENCOUNTER — Other Ambulatory Visit: Payer: Self-pay | Admitting: *Deleted

## 2024-07-21 DIAGNOSIS — Z79899 Other long term (current) drug therapy: Secondary | ICD-10-CM

## 2024-07-21 LAB — CBC WITH DIFFERENTIAL/PLATELET
Absolute Lymphocytes: 959 {cells}/uL (ref 850–3900)
Absolute Monocytes: 310 {cells}/uL (ref 200–950)
Basophils Absolute: 61 {cells}/uL (ref 0–200)
Basophils Relative: 1.3 %
Eosinophils Absolute: 52 {cells}/uL (ref 15–500)
Eosinophils Relative: 1.1 %
HCT: 45.9 % (ref 38.5–50.0)
Hemoglobin: 15.2 g/dL (ref 13.2–17.1)
MCH: 34.9 pg — ABNORMAL HIGH (ref 27.0–33.0)
MCHC: 33.1 g/dL (ref 32.0–36.0)
MCV: 105.3 fL — ABNORMAL HIGH (ref 80.0–100.0)
MPV: 11 fL (ref 7.5–12.5)
Monocytes Relative: 6.6 %
Neutro Abs: 3318 {cells}/uL (ref 1500–7800)
Neutrophils Relative %: 70.6 %
Platelets: 327 Thousand/uL (ref 140–400)
RBC: 4.36 Million/uL (ref 4.20–5.80)
RDW: 14.2 % (ref 11.0–15.0)
Total Lymphocyte: 20.4 %
WBC: 4.7 Thousand/uL (ref 3.8–10.8)

## 2024-07-21 LAB — COMPREHENSIVE METABOLIC PANEL WITH GFR
AG Ratio: 1.7 (calc) (ref 1.0–2.5)
ALT: 24 U/L (ref 9–46)
AST: 24 U/L (ref 10–35)
Albumin: 4.2 g/dL (ref 3.6–5.1)
Alkaline phosphatase (APISO): 68 U/L (ref 35–144)
BUN/Creatinine Ratio: 18 (calc) (ref 6–22)
BUN: 24 mg/dL (ref 7–25)
CO2: 27 mmol/L (ref 20–32)
Calcium: 9.2 mg/dL (ref 8.6–10.3)
Chloride: 105 mmol/L (ref 98–110)
Creat: 1.31 mg/dL — ABNORMAL HIGH (ref 0.70–1.28)
Globulin: 2.5 g/dL (ref 1.9–3.7)
Glucose, Bld: 104 mg/dL — ABNORMAL HIGH (ref 65–99)
Potassium: 5 mmol/L (ref 3.5–5.3)
Sodium: 140 mmol/L (ref 135–146)
Total Bilirubin: 0.9 mg/dL (ref 0.2–1.2)
Total Protein: 6.7 g/dL (ref 6.1–8.1)
eGFR: 57 mL/min/1.73m2 — ABNORMAL LOW (ref >=60)

## 2024-07-22 ENCOUNTER — Ambulatory Visit: Payer: Self-pay | Admitting: Rheumatology

## 2024-07-22 ENCOUNTER — Other Ambulatory Visit: Payer: Self-pay | Admitting: *Deleted

## 2024-07-22 MED ORDER — METHOTREXATE SODIUM 2.5 MG PO TABS: 15.0000 mg | ORAL_TABLET | ORAL | Status: DC

## 2024-07-22 NOTE — Progress Notes (Signed)
 CBC and CMP are stable.  Creatinine is mildly elevated.  Patient was clinically doing well at the last visit.  Please have him reduce the dose of methotrexate  to 6 tablets p.o. weekly.  Recheck BMP with GFR in 1 month.

## 2024-07-22 NOTE — Telephone Encounter (Signed)
 Patient states CBC and CMP are stable. Creatinine is mildly elevated. Patient was clinically doing well at the last visit. Patient advised to reduce the dose of methotrexate  to 6 tablets p.o. weekly. Recheck BMP with GFR in 1 month.

## 2024-08-04 NOTE — Progress Notes (Unsigned)
 Office Visit Note  Patient: Max [name]             Date of Birth: [birth year]           MRN: [id number]             PCP: Kennyth Worth HERO, MD Referring: Kennyth Worth HERO, MD Visit Date: 08/18/2024 Occupation: @GUAROCC @  Subjective:  Medication monitoring  History of Present Illness: Max [name] is a 73 y.o. male with history of rheumatoid arthritis. Patient remains on  Methotrexate  6 tablets by mouth once weekly and folic acid  2 mg daily.  He has tolerated methotrexate  without any side effects and has not had any recent gaps in therapy.  The dose of methotrexate  was reduced from 8 tablets to 6 tablets weekly due to elevation of creatinine on 07/21/2024.  He has been avoiding the use of NSAIDs.  He denies any signs or symptoms of a rheumatoid arthritis flare.  He has not been experiencing any morning stiffness, nocturnal pain, or difficulty performing ADLs.  He denies any joint inflammation. Patient was last seen in the office on 03/15/2024 at which time he was experiencing shortness of breath.  The patient was in A-fib at that time and was evaluated in the emergency department later that day as advised.  He was found to have bilateral pulmonary embolism.  Patient has been started on Eliquis  and is currently prescribed amiodarone .  He is scheduled for an ablation on 10/05/2024 and is hoping that he would not require life long anticoagulation.  Patient states that his energy level has improved and he has been starting to walk for exercise.    Activities of Daily Living:  Patient reports morning stiffness for 0 minutes.   Patient Denies nocturnal pain.  Difficulty dressing/grooming: Denies Difficulty climbing stairs: Denies Difficulty getting out of chair: Denies Difficulty using hands for taps, buttons, cutlery, and/or writing: Denies  Review of Systems  Constitutional:  Negative for fatigue.  HENT:  Negative for mouth sores and mouth dryness.   Eyes:  Negative for dryness.   Respiratory:  Negative for shortness of breath.   Cardiovascular:  Negative for chest pain and palpitations.  Gastrointestinal:  Negative for blood in stool, constipation and diarrhea.  Endocrine: Negative for increased urination.  Genitourinary:  Negative for involuntary urination.  Musculoskeletal:  Negative for joint pain, gait problem, joint pain, joint swelling, myalgias, muscle weakness, morning stiffness, muscle tenderness and myalgias.  Skin:  Negative for color change, rash, hair loss and sensitivity to sunlight.  Allergic/Immunologic: Negative for susceptible to infections.  Neurological:  Negative for dizziness and headaches.  Hematological:  Negative for swollen glands.  Psychiatric/Behavioral:  Negative for depressed mood and sleep disturbance. The patient is not nervous/anxious.     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   Pulmonary nodule 01/28/2019   History of cataract 01/28/2019   Recurrent low back pain 11/28/2015   Paroxysmal atrial fibrillation (HCC) 02/04/2014   History of cold sores 07/14/2012   Rheumatoid arthritis (HCC) 11/23/2009   ERECTILE DYSFUNCTION, 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   Essential hypertension 08/26/2007    Past Medical History:  Diagnosis Date   Abdominal hernia 11/28/2015   Allergic rhinitis  Anxiety    BPV (benign positional vertigo), right 05/11/2018   Cardiac arrhythmia    ED (erectile dysfunction)    Herpes    History of syncope    in the setting of anxiety per cardiology note   Hypertension    Kidney tumor (benign), right    sx 02/27/2015   Meningioma (HCC)    had surgery    Paroxysmal atrial fibrillation (HCC) 07/15/2012   a)  CHADSVASc score 1b) On full-dose ASA and Verapamil    RA (rheumatoid arthritis) (HCC)    Seizures (HCC) 05/14/2011   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    Vasovagal syncope     Family History  Problem Relation Age of Onset   Hypertension Brother    Prostate cancer Brother    Prostate cancer Paternal Grandfather    Healthy Son    Depression Other    Hypertension Other    GI problems Other    Lung disease Other    Colon cancer Neg Hx    Esophageal cancer Neg Hx    Ulcerative colitis Neg Hx    Past Surgical History:  Procedure Laterality Date   BRAIN MENINGIOMA EXCISION  2012   CARDIOVERSION N/A 04/28/2024   Procedure: CARDIOVERSION;  Surgeon: Loni Soyla LABOR, MD;  Location: [facility];  Service: Cardiovascular;  Laterality: N/A;   CATARACT EXTRACTION W/ INTRAOCULAR LENS IMPLANT  ~ 2001   right   COLONOSCOPY  2008   EYE SURGERY  1964   right; hit w/baseball; eye hemorrhaged   FLEXIBLE SIGMOIDOSCOPY     REFRACTIVE SURGERY  ~ 2003   ROBOTIC ASSITED PARTIAL NEPHRECTOMY Right 02/29/2016   Procedure: XI ROBOTIC ASSITED PARTIAL NEPHRECTOMY;  Surgeon: Ricardo Likens, MD;  Location: [facility];  Service: Urology;  Laterality: Right;   NEEDS ULTRASOUND   Social History   Social History Narrative   Regular Exercise   Immunization History  Administered Date(s) Administered   Fluad Quad(high Dose 65+) 11/03/2020, 11/25/2022   INFLUENZA, HIGH DOSE SEASONAL PF 11/28/2015   Influenza Split 12/18/2011   Influenza Whole 10/18/2009, 10/22/2010   Influenza, Seasonal, Injecte, Preservative Fre 12/20/2012   Influenza,inj,Quad PF,6+ Mos 12/21/2013, 02/08/2015   Influenza-Unspecified 10/10/2018, 11/01/2021   PFIZER Comirnaty(Gray Top)Covid-19 Tri-Sucrose Vaccine 06/30/2021   PFIZER(Purple Top)SARS-COV-2 Vaccination 02/05/2020, 02/28/2020, 11/03/2020   Pneumococcal Conjugate-13 03/30/2017   Pneumococcal Polysaccharide-23 03/01/2016   Td 12/15/1998, 10/18/2009      Objective: Vital Signs: BP (!) 183/71 (BP Location: Left Arm, Patient Position: Sitting, Cuff Size: Normal)   Pulse (!) 50   Resp 15   Ht 6' (1.829 m)   Wt 198 lb (89.8 kg)   BMI 26.85 kg/m    Physical Exam Vitals and nursing note reviewed.  Constitutional:      Appearance: He is well-developed.  HENT:     Head: Normocephalic and atraumatic.  Eyes:     Conjunctiva/sclera: Conjunctivae normal.     Pupils: Pupils are equal, round, and reactive to light.  Cardiovascular:     Rate and Rhythm: Rhythm irregular.     Heart sounds: Normal heart sounds.  Pulmonary:     Effort: Pulmonary effort is normal.     Breath sounds: Normal breath sounds.  Abdominal:     General: Bowel sounds are normal.     Palpations: Abdomen is soft.  Musculoskeletal:     Cervical back: Normal range of motion and neck supple.  Skin:    General: Skin is warm and dry.  Capillary Refill: Capillary refill takes less than 2 seconds.  Neurological:     Mental Status: He is alert and oriented to person, place, and time.  Psychiatric:        Behavior: Behavior normal.      Musculoskeletal Exam: C-spine, thoracic spine, lumbar spine good range of motion.  Shoulder joints, elbow joints, wrist joints, MCPs, PIPs and DIPs have good range of motion with no synovitis.  Complete fist formation bilaterally.  PIP and DIP thickening consistent with osteoarthritic changes.  Hip joints have good range of motion with no groin pain.  Knee joints have good range of motion no warmth or effusion.  Ankle joints have good range of motion with no tenderness or joint swelling.  CDAI Exam: CDAI Score: -- Patient Global: --; Provider Global: -- Swollen: --; Tender: -- Joint Exam 08/18/2024   No joint exam has been documented for this visit   There is currently no information documented on the homunculus. Go to the Rheumatology activity and complete the homunculus joint exam.  Investigation: No additional  findings.  Imaging: No results found.  Recent Labs: Lab Results  Component Value Date   WBC 4.7 07/21/2024   HGB 15.2 07/21/2024   PLT 327 07/21/2024   NA 140 07/21/2024   K 5.0 07/21/2024   CL 105 07/21/2024   CO2 27 07/21/2024   GLUCOSE 104 (H) 07/21/2024   BUN 24 07/21/2024   CREATININE 1.31 (H) 07/21/2024   BILITOT 0.9 07/21/2024   ALKPHOS 76 03/22/2024   AST 24 07/21/2024   ALT 24 07/21/2024   PROT 6.7 07/21/2024   ALBUMIN 4.6 03/22/2024   CALCIUM  9.2 07/21/2024   GFRAA 76 05/06/2021   QFTBGOLDPLUS NEGATIVE 05/06/2021    Speciality Comments: Dxd 2011. MTX x11 yrs. off and on.  Procedures:  No procedures performed Allergies: Vicodin [hydrocodone-acetaminophen ]      Assessment / Plan:     Visit Diagnoses: Rheumatoid arthritis with rheumatoid factor of multiple sites without organ or systems involvement (HCC) - +RF,+CCP Ab. dxd 2011.  Severe erosive changes noted on x-rays from 05/06/2021: He has no synovitis on examination today.  He has not had any signs or symptoms of a rheumatoid arthritis flare.  He has not been experiencing any morning stiffness, nocturnal pain, or difficulty performing ADLs.  He has clinically been doing well taking methotrexate  6 tablets by mouth once weekly and folic acid  2 mg daily.  The dose of methotrexate  was reduced from 8 tablets to 6 tablets weekly due to elevation of creatinine and low GFR on 07/21/2024.  He has not noticed any new or worsening symptoms on the reduced dose of methotrexate .  Plan to recheck BMP with GFR today to monitor for drug toxicity.  He has been avoiding use of NSAIDs as encouraged.  He was advised to notify us  if he develops any signs or symptoms of a flare.  He will follow-up in the office in 5 months or sooner if needed.  High risk medication use - Methotrexate  6 tablets by mouth once weekly and folic acid  2 mg daily.  CBC and CMP updated on 07/21/24.  BMP with GFR updated today his next lab work will be due in November  and every 3 months to monitor for drug toxicity. No recent or recurrent infections.  Discussed the importance of holding methotrexate  if he develops signs or symptoms of an infection and to resume once the infection has completely cleared.  - Plan: Basic metabolic panel with GFR  Elevated serum creatinine -Creatinine was 1.31 and GFR was 57 on 07/21/2024--the dose of methotrexate  was reduced from 8 tablets to 6 tablets weekly.  Plan to check BMP with GFR today.  He has been avoiding the use of NSAIDs.  Plan: Basic metabolic panel with GFR  Rheumatoid arthritis involving both hands with positive rheumatoid factor (HCC):   Rheumatoid arthritis involving both feet with positive rheumatoid factor (HCC): He has good range of motion of both ankle joints with no tenderness or synovitis.  No pedal edema noted today.  He is wearing proper fitting shoes.  Has been able to walk on a regular basis for exercise without difficulty.  Paroxysmal atrial fibrillation (HCC) - Patient was evaluated in the emergency department on 03/15/2024 due to increased shortness of breath.  He was found to have bilateral pulmonary embolism as well as was experiencing atrial fibrillation with RVR.SABRA  He is now taking amiodarone , Eliquis , and metoprolol  as prescribed.   His energy level has improved and he has been able to walk for exercise. Scheduled for an atrial fibrillation ablation on 10/05/2024.  History of pulmonary embolism: CT angio of the chest on 03/15/2024 revealed bilateral pulmonary emboli within the upper and lower lobe segmental and subsegmental branches. Patient is currently taking Eliquis  as prescribed. Scheduled for an atrial fibrillation ablation on 10/05/2024.  Other medical conditions are listed as follows:  Defect of right cornea  Essential hypertension: Blood pressure was elevated today in the office and was rechecked prior leaving.  Patient was advised to monitor blood pressure closely.  Patient plans on  following up with cardiology to discuss medication adjustments to better control his blood pressure.  Dyslipidemia  Pulmonary nodule  History of cataract  Actinic keratosis    Orders: Orders Placed This Encounter  Procedures   Basic metabolic panel with GFR   No orders of the defined types were placed in this encounter.    Follow-Up Instructions: Return in about 5 months (around 01/18/2025) for Rheumatoid arthritis.   Waddell CHRISTELLA Craze, PA-C  Note - This record has been created using Dragon software.  Chart creation errors have been sought, but may not always  have been located. Such creation errors do not reflect on  the standard of medical care.

## 2024-08-16 ENCOUNTER — Encounter: Payer: Self-pay | Admitting: *Deleted

## 2024-08-17 ENCOUNTER — Ambulatory Visit: Attending: Internal Medicine | Admitting: Internal Medicine

## 2024-08-17 ENCOUNTER — Ambulatory Visit: Admitting: Physician Assistant

## 2024-08-17 ENCOUNTER — Encounter: Payer: Self-pay | Admitting: Internal Medicine

## 2024-08-17 VITALS — BP 175/81 | HR 49 | Ht 72.0 in | Wt 197.6 lb

## 2024-08-17 DIAGNOSIS — D6869 Other thrombophilia: Secondary | ICD-10-CM | POA: Diagnosis not present

## 2024-08-17 DIAGNOSIS — R0683 Snoring: Secondary | ICD-10-CM | POA: Diagnosis not present

## 2024-08-17 DIAGNOSIS — I5020 Unspecified systolic (congestive) heart failure: Secondary | ICD-10-CM

## 2024-08-17 DIAGNOSIS — I4819 Other persistent atrial fibrillation: Secondary | ICD-10-CM | POA: Diagnosis not present

## 2024-08-17 DIAGNOSIS — I10 Essential (primary) hypertension: Secondary | ICD-10-CM

## 2024-08-17 NOTE — Progress Notes (Signed)
 Cardiology Office Note:  .   Date:  08/17/2024  ID:  Max [name], DOB 03-21-1951, MRN [id number] PCP: Kennyth Worth HERO, MD  [facility] Providers Cardiologist:  Soyla DELENA Merck, MD Electrophysiologist:  Fonda Kennyth, MD    History of Present Illness: .   Max [name] is a 73 y.o. male.  Discussed the use of AI scribe software for clinical note transcription with the patient, who gave verbal consent to proceed.  History of Present Illness Max [name] is a 73 year old male with atrial fibrillation and heart failure who presents for follow-up regarding his cardiac health and upcoming ablation.  He experiences palpitations and elevated blood pressure at home, with readings typically in the 120s but occasionally reaching 150 in the mornings. Recently, he recorded a high of 175/81. Previously, his blood pressure was 142/60 and 138/86. He is scheduled for an ablation on October 22nd and has been in normal rhythm since May after cardioversion and starting amiodarone . Metoprolol  was reduced from 100 mg to 25 mg due to low heart rate, and he continues on amiodarone . He denies dizziness, lightheadedness, or fainting.  He has heart failure with reduced ejection fraction and was initially on a higher dose of metoprolol  and Jardiance . An echocardiogram is planned to assess heart function. He takes Eliquis  5 mg twice daily for atrial fibrillation and thromboembolism prevention.  He has a history of irregular heartbeat, previously managed with verapamil , and has significantly reduced caffeine intake, which has helped with palpitations. He uses Breathe Right strips at night, improving sleep quality, but has not been tested for sleep apnea.    ROS: negative except per HPI above.  Studies Reviewed: SABRA   EKG Interpretation Date/Time:  Wednesday August 17 2024 09:10:36 EDT Ventricular Rate:  49 PR Interval:  160 QRS Duration:  98 QT Interval:  464 QTC  Calculation: 419 R Axis:   83  Text Interpretation: Sinus bradycardia When compared with ECG of 12-Jul-2024 08:30, ST no longer depressed in Anterior leads Confirmed by Merck Soyla ([id number]) on 08/17/2024 9:17:56 AM    Results DIAGNOSTIC EKG: Normal Echocardiogram: Reduced ejection fraction Risk Assessment/Calculations:    CHA2DS2-VASc Score = 4   This indicates a 4.8% annual risk of stroke. The patient's score is based upon: CHF History: 1 HTN History: 1 Diabetes History: 0 Stroke History: 0 Vascular Disease History: 1 Age Score: 1 Gender Score: 0      Physical Exam:   VS:  BP (!) 175/81 (BP Location: Left Arm, Patient Position: Sitting)   Pulse (!) 49   Ht 6' (1.829 m)   Wt 197 lb 9.6 oz (89.6 kg)   SpO2 95%   BMI 26.80 kg/m    Wt Readings from Last 3 Encounters:  08/18/24 198 lb (89.8 kg)  08/17/24 197 lb 9.6 oz (89.6 kg)  07/12/24 201 lb (91.2 kg)     Physical Exam VITALS: P- 49, BP- 175/81 GENERAL: Alert, cooperative, well developed, no acute distress HEENT: Normocephalic, normal oropharynx, moist mucous membranes CHEST: Clear to auscultation bilaterally, no wheezes, rhonchi, or crackles CARDIOVASCULAR: Normal heart rate and rhythm, S1 and S2 normal without murmurs ABDOMEN: Soft, non-tender, non-distended, without organomegaly, normal bowel sounds EXTREMITIES: No cyanosis, edema, or ankle swelling NEUROLOGICAL: Cranial nerves grossly intact, moves all extremities without gross motor or sensory deficit   ASSESSMENT AND PLAN: .    Assessment and Plan Assessment & Plan Atrial fibrillation status post cardioversion and planned ablation In normal sinus rhythm with  amiodarone . Ablation scheduled for October 22nd.  - Continue amiodarone  200 mg daily. - Proceed with scheduled ablation on October 22nd. - Order echocardiogram before ablation.  Heart failure with reduced ejection fraction Previous echocardiogram showed low ejection fraction, possibly due to  electrical shock vs PE vs afib. Plan to evaluate heart function. Pt was shocked by high voltage at home working on cars last fall, and hasn't felt well since.  - Order echocardiogram to assess current ejection fraction. - Consider heart MRI if echocardiogram shows persistent reduced ejection fraction.  Essential hypertension Blood pressure generally controlled with occasional elevations. Recent metoprolol  dose reduction may have increased blood pressure. - Monitor blood pressure at home. - Adjust blood pressure medications as needed based on home readings.  Bradycardia secondary to rate-controlling medications Bradycardia with heart rate around 49 bpm, asymptomatic. Metoprolol  dose previously reduced. - Monitor heart rate and symptoms. - Reduce metoprolol  dose to 12.5 mg if symptomatic bradycardia occurs.  Snoring with suspected sleep apnea Breathe Right strips improved symptoms. Discussed untreated sleep apnea's impact on atrial fibrillation. Home sleep test to evaluate presence of sleep apnea. - Order home sleep test to evaluate for sleep apnea.  Paroxysmal supraventricular premature beats (PACs) Likely benign, improved with caffeine reduction. Managed with verapamil  previously. - Continue to limit caffeine intake.      Soyla Merck, MD, FACC

## 2024-08-17 NOTE — Patient Instructions (Signed)
 Medication Instructions:  No Changes *If you need a refill on your cardiac medications before your next appointment, please call your pharmacy*  Lab Work: None  Testing/Procedures: Your physician has requested that you have an echocardiogram, prior to Ablation scheduled on 10/05/2024. Echocardiography is a painless test that uses sound waves to create images of your heart. It provides your doctor with information about the size and shape of your heart and how well your heart's chambers and valves are working. This procedure takes approximately one hour. There are no restrictions for this procedure. Please do NOT wear cologne, perfume, aftershave, or lotions (deodorant is allowed). Please arrive 15 minutes prior to your appointment time.  Please note: We ask at that you not bring children with you during ultrasound (echo/ vascular) testing. Due to room size and safety concerns, children are not allowed in the ultrasound rooms during exams. Our front office staff cannot provide observation of children in our lobby area while testing is being conducted. An adult accompanying a patient to their appointment will only be allowed in the ultrasound room at the discretion of the ultrasound technician under special circumstances. We apologize for any inconvenience.    [facility] Home Sleep Study WatchPAT?  Is a FDA cleared portable home sleep study test that uses a watch and 3 points of contact to monitor 7 different channels, including your heart rate, oxygen saturations, body position, snoring, and chest motion.  The study is easy to use from the comfort of your own home and accurately detect sleep apnea.  Before bed, you attach the chest sensor, attached the sleep apnea bracelet to your nondominant hand, and attach the finger probe.  After the study, the raw data is downloaded from the watch and scored for apnea events.   For more information: [email or web address]  Patient Testing  Instructions:  Do not put battery into the device until bedtime when you are ready to begin the test. Please call the support number if you need assistance after following the instructions below: 24 hour support line- [phone] or [facility] support at [phone] (option 2)  Download the [facility] WatchPAT One app through the google play store or App Store  Be sure to turn on or enable access to bluetooth in settlings on your smartphone/ device  Make sure no other bluetooth devices are on and within the vicinity of your smartphone/ device and WatchPAT watch during testing.  Make sure to leave your smart phone/ device plugged in and charging all night.  When ready for bed:  Follow the instructions step by step in the WatchPAT One App to activate the testing device. For additional instructions, including video instruction, visit the WatchPAT One video on Youtube. You can search for WatchPat One within Youtube (video is 4 minutes and 18 seconds) or enter: https://youtube/watch?[email or web address]_vbiwxE Please note: You will be prompted to enter a Pin to connect via bluetooth when starting the test. The PIN will be assigned to you when you receive the test.  The device is disposable, but it recommended that you retain the device until you receive a call letting you know the study has been received and the results have been interpreted.  We will let you know if the study did not transmit to us  properly after the test is completed. You do not need to call us  to confirm the receipt of the test.  Please complete the test within 48 hours of receiving PIN.   Frequently Asked Questions:  What is Watch Bruna one?  A single  use fully disposable home sleep apnea testing device and will not need to be returned after completion.  What are the requirements to use WatchPAT one?  The be able to have a successful watchpat one sleep study, you should have your Watch pat one device, your smart phone, watch pat one app, your PIN  number and Internet access What type of phone do I need?  You should have a smart phone that uses Android 5.1 and above or any Iphone with IOS 10 and above How can I download the WatchPAT one app?  Based on your device type search for WatchPAT one app either in google play for android devices or APP store for Iphone's Where will I get my PIN for the study?  Your PIN will be provided by your physician's office. It is used for authentication and if you lose/forget your PIN, please reach out to your providers office.  I do not have Internet at home. Can I do WatchPAT one study?  WatchPAT One needs Internet connection throughout the night to be able to transmit the sleep data. You can use your home/local internet or your cellular's data package. However, it is always recommended to use home/local Internet. It is estimated that between 20MB-30MB will be used with each study.However, the application will be looking for space in the phone to start the study.  What happens if I lose internet or bluetooth connection?  During the internet disconnection, your phone will not be able to transmit the sleep data. All the data, will be stored in your phone. As soon as the internet connection is back on, the phone will being sending the sleep data. During the bluetooth disconnection, WatchPAT one will not be able to to send the sleep data to your phone. Data will be kept in the WatchPAT one until two devices have bluetooth connection back on. As soon as the connection is back on, WatchPAT one will send the sleep data to the phone.  How long do I need to wear the WatchPAT one?  After you start the study, you should wear the device at least 6 hours.  How far should I keep my phone from the device?  During the night, your phone should be within 15 feet.  What happens if I leave the room for restroom or other reasons?  Leaving the room for any reason will not cause any problem. As soon as your get back to the room,  both devices will reconnect and will continue to send the sleep data. Can I use my phone during the sleep study?  Yes, you can use your phone as usual during the study. But it is recommended to put your watchpat one on when you are ready to go to bed.  How will I get my study results?  A soon as you completed your study, your sleep data will be sent to the provider. They will then share the results with you when they are ready.    Follow-Up: At [facility], you and your health needs are our priority.  As part of our continuing mission to provide you with exceptional heart care, our providers are all part of one team.  This team includes your primary Cardiologist (physician) and Advanced Practice Providers or APPs (Physician Assistants and Nurse Practitioners) who all work together to provide you with the care you need, when you need it.  Your next appointment:   3 - 4 months  Provider:   Gayatri A Acharya,  MD

## 2024-08-18 ENCOUNTER — Ambulatory Visit: Attending: Physician Assistant | Admitting: Physician Assistant

## 2024-08-18 ENCOUNTER — Encounter: Payer: Self-pay | Admitting: Physician Assistant

## 2024-08-18 VITALS — BP 183/71 | HR 50 | Resp 15 | Ht 72.0 in | Wt 198.0 lb

## 2024-08-18 DIAGNOSIS — M05772 Rheumatoid arthritis with rheumatoid factor of left ankle and foot without organ or systems involvement: Secondary | ICD-10-CM

## 2024-08-18 DIAGNOSIS — H18891 Other specified disorders of cornea, right eye: Secondary | ICD-10-CM

## 2024-08-18 DIAGNOSIS — M0579 Rheumatoid arthritis with rheumatoid factor of multiple sites without organ or systems involvement: Secondary | ICD-10-CM

## 2024-08-18 DIAGNOSIS — E785 Hyperlipidemia, unspecified: Secondary | ICD-10-CM

## 2024-08-18 DIAGNOSIS — R911 Solitary pulmonary nodule: Secondary | ICD-10-CM | POA: Diagnosis not present

## 2024-08-18 DIAGNOSIS — M05742 Rheumatoid arthritis with rheumatoid factor of left hand without organ or systems involvement: Secondary | ICD-10-CM

## 2024-08-18 DIAGNOSIS — Z8669 Personal history of other diseases of the nervous system and sense organs: Secondary | ICD-10-CM | POA: Diagnosis not present

## 2024-08-18 DIAGNOSIS — L570 Actinic keratosis: Secondary | ICD-10-CM

## 2024-08-18 DIAGNOSIS — R7989 Other specified abnormal findings of blood chemistry: Secondary | ICD-10-CM

## 2024-08-18 DIAGNOSIS — M05771 Rheumatoid arthritis with rheumatoid factor of right ankle and foot without organ or systems involvement: Secondary | ICD-10-CM

## 2024-08-18 DIAGNOSIS — Z79899 Other long term (current) drug therapy: Secondary | ICD-10-CM | POA: Diagnosis not present

## 2024-08-18 DIAGNOSIS — I480 Paroxysmal atrial fibrillation: Secondary | ICD-10-CM

## 2024-08-18 DIAGNOSIS — I10 Essential (primary) hypertension: Secondary | ICD-10-CM | POA: Diagnosis not present

## 2024-08-18 DIAGNOSIS — M05741 Rheumatoid arthritis with rheumatoid factor of right hand without organ or systems involvement: Secondary | ICD-10-CM

## 2024-08-18 DIAGNOSIS — Z86711 Personal history of pulmonary embolism: Secondary | ICD-10-CM

## 2024-08-18 LAB — BASIC METABOLIC PANEL WITH GFR
BUN: 20 mg/dL (ref 7–25)
CO2: 31 mmol/L (ref 20–32)
Calcium: 9.3 mg/dL (ref 8.6–10.3)
Chloride: 104 mmol/L (ref 98–110)
Creat: 1.2 mg/dL (ref 0.70–1.28)
Glucose, Bld: 96 mg/dL (ref 65–99)
Potassium: 4.6 mmol/L (ref 3.5–5.3)
Sodium: 140 mmol/L (ref 135–146)
eGFR: 64 mL/min/1.73m2 (ref >=60)

## 2024-08-19 ENCOUNTER — Ambulatory Visit: Payer: Self-pay | Admitting: Physician Assistant

## 2024-08-19 NOTE — Progress Notes (Signed)
 BMP with GFR WNL--renal function has improved and returned to baseline.  Continue current dose of methotrexate .

## 2024-09-13 ENCOUNTER — Encounter (HOSPITAL_COMMUNITY): Payer: Self-pay

## 2024-09-13 ENCOUNTER — Telehealth (HOSPITAL_COMMUNITY): Payer: Self-pay

## 2024-09-13 NOTE — Telephone Encounter (Signed)
 Attempted to reach patient to discuss upcoming procedure, no answer. Left VM for patient to return call.

## 2024-09-14 NOTE — Telephone Encounter (Signed)
 Received return call to complete pre-procedure call.     Health status review:  Any new medical conditions, recent signs of acute illness or been started on antibiotics? No Any recent hospitalizations or surgeries? No Any new medications started since pre-op visit? No  Follow all medication instructions prior to procedure or the procedure may be rescheduled:    Continue taking Eliquis  (Apixaban ) twice daily without missing any doses before procedure. Essential chronic medications:  No medication should be continued, unless told otherwise.  HOLD: Empagliflozin  (Jardiance ) for 3 days prior to the procedure. Last dose on Saturday, October 18.  On the morning of your procedure DO NOT take any medication., including Eliquis  (Apixaban ).  Nothing to eat or drink after midnight prior to your procedure.  Pre-procedure testing scheduled:  lab work on October 9.  Confirmed patient is scheduled for Atrial Fibrillation Ablation on Wednesday, October 22 with Dr. Sidra Kitty. Instructed patient to arrive at the Main Entrance A at [facility]: [address] [place], [place] [address] and check in at Admitting at 5:30 AM.  Advised of plan to go home the same day and will only stay overnight if medically necessary. You MUST have a responsible adult to drive you home and MUST be with you the first 24 hours after you arrive home or your procedure could be cancelled.  Informed patient a nurse will call a day before the procedure to confirm arrival time and ensure instructions are followed.  Patient/Wife, Max [name] verbalized understanding to information provided and is agreeable to proceed with procedure.   Advised to contact RN Navigator at [phone], to inform of any new medications started after call or concerns prior to procedure.

## 2024-09-22 ENCOUNTER — Ambulatory Visit (HOSPITAL_COMMUNITY)
Admission: RE | Admit: 2024-09-22 | Discharge: 2024-09-22 | Disposition: A | Discharge status: Home / Self Care | Source: Ambulatory Visit | Attending: Cardiology | Admitting: Cardiology

## 2024-09-22 DIAGNOSIS — I5020 Unspecified systolic (congestive) heart failure: Secondary | ICD-10-CM | POA: Insufficient documentation

## 2024-09-22 DIAGNOSIS — I4819 Other persistent atrial fibrillation: Secondary | ICD-10-CM | POA: Diagnosis not present

## 2024-09-22 LAB — ECHOCARDIOGRAM COMPLETE: S' Lateral: 3.19 cm

## 2024-09-22 LAB — CBC

## 2024-09-22 NOTE — Progress Notes (Signed)
 Max [name]                                          MRN: [id number]   09/22/2024   The [facility] Quality Team Specialist reviewed this patient medical record for the purposes of chart review for care gap closure. The following were reviewed: abstraction for care gap closure-controlling blood pressure.    [facility]BCI Quality Team

## 2024-09-23 LAB — CBC
Hematocrit: 44.9 % (ref 37.5–51.0)
Hemoglobin: 14.8 g/dL (ref 13.0–17.7)
MCH: 34.6 pg — AB (ref 26.6–33.0)
MCHC: 33 g/dL (ref 31.5–35.7)
MCV: 105 fL — AB (ref 79–97)
Platelets: 316 x10E3/uL (ref 150–450)
RBC: 4.28 x10E6/uL (ref 4.14–5.80)
RDW: 14 % (ref 11.6–15.4)
WBC: 4.1 x10E3/uL (ref 3.4–10.8)

## 2024-09-23 LAB — BASIC METABOLIC PANEL WITH GFR
BUN/Creatinine Ratio: 14 (ref 10–24)
BUN: 19 mg/dL (ref 8–27)
CO2: 25 mmol/L (ref 20–29)
Calcium: 9.4 mg/dL (ref 8.6–10.2)
Chloride: 104 mmol/L (ref 96–106)
Creatinine, Ser: 1.36 mg/dL — ABNORMAL HIGH (ref 0.76–1.27)
Glucose: 76 mg/dL (ref 70–99)
Potassium: 4.9 mmol/L (ref 3.5–5.2)
Sodium: 142 mmol/L (ref 134–144)
eGFR: 55 mL/min/1.73 — ABNORMAL LOW (ref >59)

## 2024-09-25 ENCOUNTER — Other Ambulatory Visit (HOSPITAL_COMMUNITY): Payer: Self-pay | Admitting: Internal Medicine

## 2024-09-26 ENCOUNTER — Ambulatory Visit: Payer: Self-pay | Admitting: Cardiology

## 2024-10-03 ENCOUNTER — Other Ambulatory Visit: Payer: Self-pay | Admitting: Physician Assistant

## 2024-10-03 ENCOUNTER — Other Ambulatory Visit: Payer: Self-pay | Admitting: Family Medicine

## 2024-10-03 NOTE — Telephone Encounter (Signed)
 Last Fill: 07/22/2024  Labs: 09/22/2024 MCV 105, MCH 34.6, Creat. 1.36, GFR 55  Next Visit: 01/19/2025  Last Visit: 08/18/2024  DX: Rheumatoid arthritis with rheumatoid factor of multiple sites without organ or systems involvement   Current Dose per office note 08/18/2024:  Methotrexate  6 tablets by mouth once weekly   Okay to refill Methotrexate ?

## 2024-10-04 NOTE — Pre-Procedure Instructions (Signed)
 Spoke with Avelina, wife regarding the following items: Arrival time 0515 Nothing to eat or drink after midnight No meds AM of procedure Responsible person to drive you home and stay with you for 24 hrs  Have you missed any doses of anti-coagulant Eliquis - takes twice a day , hasn't missed any doses.  Don't take dose morning of procedure.

## 2024-10-05 ENCOUNTER — Other Ambulatory Visit: Payer: Self-pay

## 2024-10-05 ENCOUNTER — Ambulatory Visit (HOSPITAL_COMMUNITY)
Admission: RE | Admit: 2024-10-05 | Discharge: 2024-10-05 | Disposition: A | Discharge status: Home / Self Care | Attending: Cardiology | Admitting: Cardiology

## 2024-10-05 ENCOUNTER — Ambulatory Visit (HOSPITAL_COMMUNITY): Admitting: Anesthesiology

## 2024-10-05 ENCOUNTER — Ambulatory Visit (HOSPITAL_COMMUNITY)
Admission: RE | Disposition: A | Discharge status: Home / Self Care | Payer: Self-pay | Source: Home / Self Care | Attending: Cardiology

## 2024-10-05 DIAGNOSIS — Z7901 Long term (current) use of anticoagulants: Secondary | ICD-10-CM | POA: Diagnosis not present

## 2024-10-05 DIAGNOSIS — I4891 Unspecified atrial fibrillation: Secondary | ICD-10-CM

## 2024-10-05 DIAGNOSIS — D6869 Other thrombophilia: Secondary | ICD-10-CM | POA: Diagnosis not present

## 2024-10-05 DIAGNOSIS — I4819 Other persistent atrial fibrillation: Secondary | ICD-10-CM | POA: Diagnosis not present

## 2024-10-05 DIAGNOSIS — I110 Hypertensive heart disease with heart failure: Secondary | ICD-10-CM | POA: Insufficient documentation

## 2024-10-05 DIAGNOSIS — Z79899 Other long term (current) drug therapy: Secondary | ICD-10-CM | POA: Insufficient documentation

## 2024-10-05 DIAGNOSIS — I4892 Unspecified atrial flutter: Secondary | ICD-10-CM

## 2024-10-05 DIAGNOSIS — I5022 Chronic systolic (congestive) heart failure: Secondary | ICD-10-CM | POA: Diagnosis not present

## 2024-10-05 DIAGNOSIS — R569 Unspecified convulsions: Secondary | ICD-10-CM | POA: Diagnosis not present

## 2024-10-05 HISTORY — PX: ATRIAL FIBRILLATION ABLATION: EP1191

## 2024-10-05 MED ORDER — FENTANYL CITRATE (PF) 250 MCG/5ML IJ SOLN
INTRAMUSCULAR | Status: DC | PRN
Start: 2024-10-05 — End: 2024-10-05
  Administered 2024-10-05: 100 ug via INTRAVENOUS

## 2024-10-05 MED ORDER — ONDANSETRON HCL 4 MG/2ML IJ SOLN
INTRAMUSCULAR | Status: DC | PRN
  Administered 2024-10-05: 4 mg via INTRAVENOUS

## 2024-10-05 MED ORDER — SODIUM CHLORIDE 0.9 % IV SOLN: INTRAVENOUS | Status: DC

## 2024-10-05 MED ORDER — SUGAMMADEX SODIUM 200 MG/2ML IV SOLN
INTRAVENOUS | Status: DC | PRN
Start: 2024-10-05 — End: 2024-10-05
  Administered 2024-10-05: 200 mg via INTRAVENOUS

## 2024-10-05 MED ORDER — SODIUM CHLORIDE 0.9% FLUSH: 3.0000 mL | INTRAVENOUS | Status: DC | PRN

## 2024-10-05 MED ORDER — APIXABAN 5 MG PO TABS
5.0000 mg | ORAL_TABLET | Freq: Once | ORAL | Status: AC
  Administered 2024-10-05: 5 mg via ORAL
  Filled 2024-10-05: qty 1

## 2024-10-05 MED ORDER — PROTAMINE SULFATE 10 MG/ML IV SOLN
INTRAVENOUS | Status: DC | PRN
  Administered 2024-10-05: 35 mg via INTRAVENOUS

## 2024-10-05 MED ORDER — LIDOCAINE 2% (20 MG/ML) 5 ML SYRINGE
INTRAMUSCULAR | Status: DC | PRN
  Administered 2024-10-05: 100 mg via INTRAVENOUS

## 2024-10-05 MED ORDER — PHENYLEPHRINE HCL-NACL 20-0.9 MG/250ML-% IV SOLN
INTRAVENOUS | Status: DC | PRN
  Administered 2024-10-05: 30 ug/min via INTRAVENOUS

## 2024-10-05 MED ORDER — ATROPINE SULFATE 1 MG/10ML IJ SOSY
PREFILLED_SYRINGE | INTRAMUSCULAR | Status: DC | PRN
  Administered 2024-10-05: 1 mg via INTRAVENOUS

## 2024-10-05 MED ORDER — SODIUM CHLORIDE 0.9% FLUSH: 3.0000 mL | Freq: Two times a day (BID) | INTRAVENOUS | Status: DC

## 2024-10-05 MED ORDER — SODIUM CHLORIDE 0.9 % IV SOLN: 250.0000 mL | INTRAVENOUS | Status: DC | PRN

## 2024-10-05 MED ORDER — FENTANYL CITRATE (PF) 100 MCG/2ML IJ SOLN
INTRAMUSCULAR | Status: AC
  Filled 2024-10-05: qty 2

## 2024-10-05 MED ORDER — ACETAMINOPHEN 325 MG PO TABS: 650.0000 mg | ORAL_TABLET | ORAL | Status: DC | PRN

## 2024-10-05 MED ORDER — ROCURONIUM BROMIDE 10 MG/ML (PF) SYRINGE
PREFILLED_SYRINGE | INTRAVENOUS | Status: DC | PRN
  Administered 2024-10-05: 70 mg via INTRAVENOUS
  Administered 2024-10-05: 5 mg via INTRAVENOUS

## 2024-10-05 MED ORDER — HEPARIN (PORCINE) IN NACL 1000-0.9 UT/500ML-% IV SOLN
INTRAVENOUS | Status: DC | PRN
  Administered 2024-10-05 (×3): 500 mL

## 2024-10-05 MED ORDER — HEPARIN SODIUM (PORCINE) 1000 UNIT/ML IJ SOLN
INTRAMUSCULAR | Status: DC | PRN
  Administered 2024-10-05: 1000 [IU] via INTRAVENOUS
  Administered 2024-10-05: 15000 [IU] via INTRAVENOUS
  Administered 2024-10-05: 5000 [IU] via INTRAVENOUS

## 2024-10-05 MED ORDER — ATROPINE SULFATE 1 MG/10ML IJ SOSY
PREFILLED_SYRINGE | INTRAMUSCULAR | Status: AC
Start: 2024-10-05 — End: 2024-10-05
  Filled 2024-10-05: qty 10

## 2024-10-05 MED ORDER — LISINOPRIL 5 MG PO TABS: 5.0000 mg | ORAL_TABLET | Freq: Every day | ORAL | 3 refills | Status: DC

## 2024-10-05 MED ORDER — PROPOFOL 10 MG/ML IV BOLUS
INTRAVENOUS | Status: DC | PRN
  Administered 2024-10-05: 120 mg via INTRAVENOUS

## 2024-10-05 MED ORDER — DEXAMETHASONE SOD PHOSPHATE PF 10 MG/ML IJ SOLN
INTRAMUSCULAR | Status: DC | PRN
  Administered 2024-10-05: 8 mg via INTRAVENOUS

## 2024-10-05 MED ORDER — ONDANSETRON HCL 4 MG/2ML IJ SOLN: 4.0000 mg | Freq: Four times a day (QID) | INTRAMUSCULAR | Status: DC | PRN

## 2024-10-05 MED ORDER — METOPROLOL SUCCINATE ER 50 MG PO TB24
25.0000 mg | ORAL_TABLET | Freq: Every day | ORAL | 3 refills | Status: AC
Start: 2024-10-06 — End: ?

## 2024-10-05 MED ORDER — PHENYLEPHRINE 80 MCG/ML (10ML) SYRINGE FOR IV PUSH (FOR BLOOD PRESSURE SUPPORT)
PREFILLED_SYRINGE | INTRAVENOUS | Status: DC | PRN
  Administered 2024-10-05: 120 ug via INTRAVENOUS

## 2024-10-05 NOTE — Progress Notes (Signed)
 Patient ambulated to the bathroom and was able to void. Bilateral groin sites clean,dry, and intact. No hematoma or bleeding noted to bilateral sites. Patient getting dressed for discharge.

## 2024-10-05 NOTE — Anesthesia Postprocedure Evaluation (Signed)
 Anesthesia Post Note  Patient: Max [name]  Procedure(s) Performed: ATRIAL FIBRILLATION ABLATION     Patient location during evaluation: PACU Anesthesia Type: General Level of consciousness: awake and alert Pain management: pain level controlled Vital Signs Assessment: post-procedure vital signs reviewed and stable Respiratory status: spontaneous breathing, nonlabored ventilation, respiratory function stable and patient connected to nasal cannula oxygen Cardiovascular status: blood pressure returned to baseline and stable Postop Assessment: no apparent nausea or vomiting Anesthetic complications: no   No notable events documented.  Last Vitals:  Vitals:   10/05/24 1145 10/05/24 1200  BP: 118/63 121/63  Pulse: (!) 49 (!) 49  Resp: 15 16  Temp:    SpO2: 93% 92%    Last Pain:  Vitals:   10/05/24 1127  TempSrc:   PainSc: 0-No pain                 Lynwood MARLA Cornea

## 2024-10-05 NOTE — Transfer of Care (Signed)
 Immediate Anesthesia Transfer of Care Note  Patient: Max [name]  Procedure(s) Performed: ATRIAL FIBRILLATION ABLATION  Patient Location: PACU  Anesthesia Type:General  Level of Consciousness: awake and drowsy  Airway & Oxygen Therapy: Patient Spontanous Breathing  Post-op Assessment: Report given to RN  Post vital signs: Reviewed and stable  Last Vitals:  Vitals Value Taken Time  BP 108/57 10/05/24 10:25  Temp    Pulse 49 10/05/24 10:30  Resp 11 10/05/24 10:30  SpO2 92 % 10/05/24 10:30  Vitals shown include unfiled device data.  Last Pain:  Vitals:   10/05/24 0555  TempSrc: Oral         Complications: No notable events documented.

## 2024-10-05 NOTE — Discharge Instructions (Signed)

## 2024-10-05 NOTE — Anesthesia Preprocedure Evaluation (Signed)
 Anesthesia Evaluation  Patient identified by MRN, date of birth, ID band Patient awake    Reviewed: Allergy & Precautions, NPO status , Patient's Chart, lab work & pertinent test results, reviewed documented beta blocker date and time   History of Anesthesia Complications Negative for: history of anesthetic complications  Airway Mallampati: II  TM Distance: >3 FB     Dental no notable dental hx.    Pulmonary neg COPD, neg PE   breath sounds clear to auscultation       Cardiovascular hypertension, (-) CAD, (-) Past MI, (-) Cardiac Stents and (-) CABG + dysrhythmias Atrial Fibrillation  Rhythm:Regular Rate:Normal     Neuro/Psych Seizures -, Well Controlled,   Anxiety        GI/Hepatic ,,,(+) neg Cirrhosis        Endo/Other  neg diabetes    Renal/GU CRFRenal disease     Musculoskeletal  (+) Arthritis ,    Abdominal   Peds  Hematology   Anesthesia Other Findings   Reproductive/Obstetrics                              Anesthesia Physical Anesthesia Plan  ASA: 2  Anesthesia Plan: General   Post-op Pain Management:    Induction: Intravenous  PONV Risk Score and Plan: 2 and Ondansetron  and Dexamethasone   Airway Management Planned: Oral ETT  Additional Equipment:   Intra-op Plan:   Post-operative Plan: Extubation in OR  Informed Consent: I have reviewed the patients History and Physical, chart, labs and discussed the procedure including the risks, benefits and alternatives for the proposed anesthesia with the patient or authorized representative who has indicated his/her understanding and acceptance.     Dental advisory given  Plan Discussed with: CRNA  Anesthesia Plan Comments:          Anesthesia Quick Evaluation

## 2024-10-05 NOTE — Anesthesia Procedure Notes (Signed)
 Procedure Name: Intubation Date/Time: 10/05/2024 7:46 AM  Performed by: Delores Duwaine SAUNDERS, CRNAPre-anesthesia Checklist: Patient identified, Emergency Drugs available, Suction available and Patient being monitored Patient Re-evaluated:Patient Re-evaluated prior to induction Oxygen Delivery Method: Circle System Utilized Preoxygenation: Pre-oxygenation with 100% oxygen Induction Type: IV induction Ventilation: Mask ventilation without difficulty Laryngoscope Size: Mac and 4 Grade View: Grade I Tube type: Oral Tube size: 7.5 mm Number of attempts: 1 Airway Equipment and Method: Stylet and Oral airway Placement Confirmation: ETT inserted through vocal cords under direct vision, positive ETCO2 and breath sounds checked- equal and bilateral Secured at: 22 cm Tube secured with: Tape Dental Injury: Teeth and Oropharynx as per pre-operative assessment

## 2024-10-05 NOTE — H&P (Signed)
 Electrophysiology Note:   Date:  10/05/24  ID:  Max [name], DOB 05-Oct-1951, MRN [id number]   Primary Cardiologist: Soyla DELENA Merck, MD Electrophysiologist: Fonda Kitty, MD       History of Present Illness:   Max [name] is a 73 y.o. male with h/o chronic systolic heart failure, bilateral PE, aortic atherosclerosis by CT imaging, HTN, rheumatoid arthritis, meningioma s/p resection 2012, pulmonary nodule, seizure, anxiety, and atrial fibrillation who is being seen today for evaluation for catheter ablation.     Discussed the use of AI scribe software for clinical note transcription with the patient, who gave verbal consent to proceed.   History of Present Illness He is accompanied by his wife, Max [name]. He was referred for evaluation of his atrial fibrillation.   Seen by Cardiology noted to be in Afib on 4/16. S/p successful DCCV on 5/15. Seen by Cardiology for follow up on 5/30 and noted to have ERAF. Started amiodarone  on 6/13. Repeat DCCV on 7/18.  He has maintained normal rhythm since last cardioversion on amiodarone .  He has not noticed a substantial improvement in his symptoms since being in normal rhythm, although his heart rates have been in the 40s, which may be limiting him to some extent.  He is taking both amiodarone  and metoprolol .  He is also taking Eliquis  for stroke prevention and denies any issues with this medication.  Currently denies any significant fatigue, shortness of breath, palpitations, dizziness, or lightheadedness. He is able to perform daily activities, including working every day and climbing stairs, without limitations.  No new or acute complaints today.    Interval: Patient presents today for planned ablation. Reports feeling relatively well. No new or acute complaints.  Review of systems complete and found to be negative unless listed in HPI.    EP Information / Studies Reviewed:      EKG Interpretation Date/Time:                  Tuesday July 12 2024 08:30:42 EDT Ventricular Rate:         44 PR Interval:                 182 QRS Duration:             100 QT Interval:                 474 QTC Calculation:405 R Axis:                         90   Text Interpretation:Marked sinus bradycardia Nonspecific ST and T wave abnormality When compared with ECG of 01-Jul-2024 08:08, T wave inversion now evident in Anterior leads Confirmed by Kitty Fonda [phone]) on 07/12/2024 8:44:19 AM    EKG 05/27/24: AF     Echo 03/15/24:   1. Left ventricular ejection fraction, by estimation, is 30 to 35%. The  left ventricle has moderately decreased function. The left ventricle has  no regional wall motion abnormalities. Left ventricular diastolic  parameters are consistent with Grade I  diastolic dysfunction (impaired relaxation). There is the interventricular  septum is flattened in systole, consistent with right ventricular pressure  overload.   2. Reduced pulmonary acceleration time consistent with RV dysfunction.  Right ventricular systolic function is mildly reduced. The right  ventricular size is moderately enlarged. There is moderately elevated  pulmonary artery systolic pressure.   3. Left atrial size was moderately dilated.   4.  Right atrial size was mild to moderately dilated.   5. The mitral valve is normal in structure. Mild mitral valve  regurgitation. No evidence of mitral stenosis.   6. The aortic valve is normal in structure. Aortic valve regurgitation is  not visualized. No aortic stenosis is present.   7. The inferior vena cava is dilated in size with <50% respiratory  variability, suggesting right atrial pressure of 15 mmHg.    Risk Assessment/Calculations:     CHA2DS2-VASc Score = 4   This indicates a 4.8% annual risk of stroke. The patient's score is based upon: CHF History: 1 HTN History: 1 Diabetes History: 0 Stroke History: 0 Vascular Disease History: 1 Age Score: 1 Gender Score: 0           Physical Exam:   VS:   BP (!) 142/60 (BP Location: Right Arm, Patient Position: Sitting, Cuff Size: Normal)   Pulse (!) 44   Ht 6' (1.829 m)   Wt 201 lb (91.2 kg)   SpO2 97%   BMI 27.26 kg/m       Wt Readings from Last 3 Encounters:  07/12/24 201 lb (91.2 kg)  06/23/24 205 lb 3.2 oz (93.1 kg)  05/27/24 202 lb (91.6 kg)      GEN: Well nourished, well developed in no acute distress NECK: No JVD CARDIAC: Bradycardic, regular RESPIRATORY:  Clear to auscultation without rales, wheezing or rhonchi  ABDOMEN: Soft, non-distended EXTREMITIES:  No edema; No deformity    ASSESSMENT AND PLAN:     #. Persistent atrial fibrillation: Associated with systolic heart failure. For this reason, we have prioritized a rhythm control strategy.  #. High risk medication use: Amiodarone .  TSH normal 03/2024.  LFTs normal 03/2024.  QTc on EKG today is normal. -Discussed treatment options today for AF including antiarrhythmic drug therapy and ablation. Discussed risks, recovery and likelihood of success with each treatment strategy. Risk, benefits, and alternatives to EP study and ablation for afib were discussed. These risks include but are not limited to stroke, bleeding, vascular damage, tamponade, perforation, damage to the esophagus, lungs, phrenic nerve and other structures, pulmonary vein stenosis, worsening renal function, coronary vasospasm and death.  Discussed potential need for repeat ablation procedures and antiarrhythmic drugs after an initial ablation. Patient voiced understanding and wants to proceed today. -Continue amiodarone  as bridge to ablation.  -Decrease metoprolol  XL to 25mg  once daily d/t significant bradycardia.  We will leave on low-dose due to history of systolic dysfunction.   #. Secondary hypercoagulable state due to AF:  -Continue Eliquis  5mg  BID.    #. Chronic systolic heart failure: EF dropped in setting of AF. Last had normal EF and normal nuclear stress in 2013. - Rhythm control strategy as above.   - Repeat echocardiogram in sinus after ablation.    Follow up with Dr. Kennyth 3 months after ablation.    Signed, Fonda Kennyth, MD

## 2024-10-06 ENCOUNTER — Telehealth (HOSPITAL_COMMUNITY): Payer: Self-pay

## 2024-10-06 ENCOUNTER — Encounter (HOSPITAL_COMMUNITY): Payer: Self-pay | Admitting: Cardiology

## 2024-10-06 LAB — POCT ACTIVATED CLOTTING TIME
Activated Clotting Time: 256 s
Activated Clotting Time: 302 s

## 2024-10-06 MED FILL — Fentanyl Citrate Preservative Free (PF) Inj 100 MCG/2ML: INTRAMUSCULAR | Qty: 2 | Status: AC

## 2024-10-06 NOTE — Telephone Encounter (Signed)
 Spoke with patient and wife, Max [name], to complete post procedure follow up call.  Patient reports no complications with groin sites.   Instructions reviewed:  Remove large bandage at puncture site after 24 hours. It is normal to have bruising, tenderness, mild swelling, and a pea or marble sized lump/knot at the groin site which can take up to three months to resolve.  Get help right away if you notice sudden swelling at the puncture site.  Check your puncture site every day for signs of infection: fever, redness, swelling, pus drainage, warmth, foul odor or excessive pain. If this occurs, please call [phone], to speak with the RN Navigator. Get help right away if your puncture site is bleeding and the bleeding does not stop after applying firm pressure to the area.  You may continue to have skipped beats/ atrial fibrillation during the first several months after your procedure.  It is very important not to miss any doses of your blood thinner Eliquis .    You will follow up with the Afib clinic 4 weeks after your procedure and follow up with the APP 3 months after your procedure.  Activity restrictions reviewed.  Both patient and wife, Max [name], verbalized understanding to all instructions provided.

## 2024-10-17 ENCOUNTER — Encounter (HOSPITAL_BASED_OUTPATIENT_CLINIC_OR_DEPARTMENT_OTHER): Payer: Self-pay | Admitting: Cardiology

## 2024-10-17 DIAGNOSIS — R030 Elevated blood-pressure reading, without diagnosis of hypertension: Secondary | ICD-10-CM | POA: Diagnosis not present

## 2024-10-17 DIAGNOSIS — I480 Paroxysmal atrial fibrillation: Secondary | ICD-10-CM

## 2024-10-17 DIAGNOSIS — G4730 Sleep apnea, unspecified: Secondary | ICD-10-CM | POA: Diagnosis not present

## 2024-10-17 DIAGNOSIS — R0683 Snoring: Secondary | ICD-10-CM | POA: Diagnosis not present

## 2024-10-23 ENCOUNTER — Other Ambulatory Visit: Payer: Self-pay | Admitting: Physician Assistant

## 2024-10-24 NOTE — Telephone Encounter (Signed)
 Last Fill: 08/03/2024  Next Visit: 01/19/2025  Last Visit: 08/18/2024  Dx: Rheumatoid arthritis with rheumatoid factor of multiple sites without organ or systems involvement   Current Dose per office note on 08/18/2024: folic acid  2 mg daily.   Okay to refill Folic Acid ?

## 2024-10-25 ENCOUNTER — Ambulatory Visit: Payer: Self-pay | Admitting: Internal Medicine

## 2024-10-25 ENCOUNTER — Ambulatory Visit: Attending: Internal Medicine

## 2024-10-25 DIAGNOSIS — R0683 Snoring: Secondary | ICD-10-CM

## 2024-10-25 DIAGNOSIS — I4819 Other persistent atrial fibrillation: Secondary | ICD-10-CM

## 2024-10-25 NOTE — Procedures (Signed)
    SLEEP STUDY REPORT Patient Information Study Date: 10/17/2024 Patient Name: Max [name] Patient ID: [id number] Birth Date: Apr 21, 1951 Age: 73 Gender: Male BMI: 26.9 (W=198 lb, H=6' 0'') Referring Physician: Soyla Merck, MD  TEST DESCRIPTION: Home sleep apnea testing was completed using the WatchPat, a Type 1 device, utilizing peripheral arterial tonometry (PAT), chest movement, actigraphy, pulse oximetry, pulse rate, body position and snore. AHI was calculated with apnea and hypopnea using valid sleep time as the denominator. RDI includes apneas, hypopneas, and RERAs. The data acquired and the scoring of sleep and all associated events were performed in accordance with the recommended standards and specifications as outlined in the AASM Manual for the Scoring of Sleep and Associated Events 2.2.0 (2015).  FINDINGS: 1. No evidence of Obstructive Sleep Apnea with AHI 1.2/hr. 2. No Central Sleep Apnea. 3. Oxygen desaturations as low as 90%. 4. Mild to moderate snoring was present. O2 sats were < 88% for 0 minutes. 5. Total sleep time was 7 hrs and 19 min. 6. 18.3% of total sleep time was spent in REM sleep. 7. Normal sleep onset latency at 15 min. 8. Shortened REM sleep onset latency at 72 min. 9. Total awakenings were 4.  DIAGNOSIS: Normal study with no significant sleep disordered breathing.  RECOMMENDATIONS: 1. Normal study with no significant sleep disordered breathing. 2. Healthy sleep recommendations include: adequate nightly sleep (normal 7-9 hrs/night), avoidance of caffeine after noon and alcohol near bedtime, and maintaining a sleep environment that is cool, dark and quiet. 3. Weight loss for overweight patients is recommended. 4. Snoring recommendations include: weight loss where appropriate, side sleeping, and avoidance of alcohol before bed. 5. Operation of motor vehicle or dangerous equipment must be avoided when feeling drowsy, excessively sleepy,  or mentally fatigued. 6. An ENT consultation which may be useful for specific causes of and possible treatment of bothersome snoring . 7. Weight loss may be of benefit in reducing the severity of snoring.   Signature: Wilbert Bihari, MD; [facility]; Diplomat, American Board of Sleep Medicine Electronically Signed: 10/25/2024 5:48:37 PM

## 2024-10-27 NOTE — Telephone Encounter (Signed)
-----   Message from Soyla DELENA Merck sent at 10/25/2024  5:52 PM EST ----- No sleep apnea ----- Message ----- From: Shlomo Wilbert SAUNDERS, MD Sent: 10/25/2024   5:50 PM EST To: Soyla DELENA Merck, MD  No OSA

## 2024-10-27 NOTE — Telephone Encounter (Signed)
 Called and spoke to spouse, Bruna. They do already know the results of sleep study. NO OSA. Results given, per Dr. Acharya.    No other concerns.  10/27/24

## 2024-10-31 ENCOUNTER — Telehealth: Payer: Self-pay | Admitting: *Deleted

## 2024-10-31 NOTE — Telephone Encounter (Signed)
 Recommend rechecking BMP with GFR prior to increasing the dose of methotrexate 

## 2024-10-31 NOTE — Telephone Encounter (Signed)
-----   Message from Wilbert Bihari sent at 10/25/2024  5:50 PM EST ----- Please let patient know that sleep study showed no significant sleep apnea.

## 2024-10-31 NOTE — Telephone Encounter (Signed)
 The patient has been notified of the result via his mychart.

## 2024-11-01 ENCOUNTER — Encounter (HOSPITAL_COMMUNITY): Payer: Self-pay | Admitting: Internal Medicine

## 2024-11-01 ENCOUNTER — Ambulatory Visit (HOSPITAL_COMMUNITY)
Admission: RE | Admit: 2024-11-01 | Discharge: 2024-11-01 | Disposition: A | Discharge status: Home / Self Care | Source: Ambulatory Visit | Attending: Internal Medicine | Admitting: Internal Medicine

## 2024-11-01 VITALS — BP 170/88 | HR 51 | Ht 72.0 in | Wt 202.6 lb

## 2024-11-01 DIAGNOSIS — Z5181 Encounter for therapeutic drug level monitoring: Secondary | ICD-10-CM

## 2024-11-01 DIAGNOSIS — I4819 Other persistent atrial fibrillation: Secondary | ICD-10-CM

## 2024-11-01 DIAGNOSIS — I480 Paroxysmal atrial fibrillation: Secondary | ICD-10-CM

## 2024-11-01 DIAGNOSIS — D6869 Other thrombophilia: Secondary | ICD-10-CM | POA: Diagnosis not present

## 2024-11-01 NOTE — Progress Notes (Signed)
 Primary Care Physician: Kennyth Worth HERO, MD Primary Cardiologist: Soyla DELENA Merck, MD Electrophysiologist: Fonda Kennyth, MD     Referring Physician: Scot Ford, PA-C     Max [name] is a 73 y.o. male with a history of HFrEF, bilateral PE, aortic atherosclerosis by CT imaging, HTN, RA, s/p meningioma resection 2012, pulmonary nodule, seizure, anxiety, and atrial fibrillation who presents for consultation in the [facility]. Seen by Cardiology noted to be in Afib on 4/16. S/p successful DCCV on 5/15; seen by Cardiology for follow up on 5/30 and noted to have ERAF. Currently taking Toprol  50 mg daily. Patient is on Eliquis  for a CHADS2VASC score of 4.  On follow up 11/01/24, patient is currently in NSR. S/p Afib/flutter ablation on 10/05/24 by Dr. Kennyth. No episodes of Afib since ablation. Patient instructed to stop amiodarone  in 1 month. No chest pain or SOB. Leg sites healed without issue. No missed doses of anticoagulant.  Today, he denies symptoms of orthopnea, PND, lower extremity edema, dizziness, presyncope, syncope, snoring, daytime somnolence, bleeding, or neurologic sequela. The patient is tolerating medications without difficulties and is otherwise without complaint today.    he has a BMI of Body mass index is 27.48 kg/m.SABRA Filed Weights   11/01/24 1027  Weight: 91.9 kg     Current Outpatient Medications  Medication Sig Dispense Refill   acetaminophen  (TYLENOL ) 500 MG tablet Take 1,000 mg by mouth every 6 (six) hours as needed (pain.).     acyclovir  (ZOVIRAX ) 800 MG tablet TAKE 1 TABLET (800 MG TOTAL) BY MOUTH 3 (THREE) TIMES DAILY AS NEEDED. FOR OUTBREAKS 60 tablet 1   amiodarone  (PACERONE ) 200 MG tablet Take 1 tablet (200 mg total) by mouth daily. 60 tablet 3   apixaban  (ELIQUIS ) 5 MG TABS tablet Take 1 tablet (5 mg total) by mouth 2 (two) times daily. 180 tablet 3   Ascorbic Acid  (VITAMIN C ) 1000 MG tablet Take 1,000 mg by mouth every  evening.     Cholecalciferol (VITAMIN D3) 1000 units CAPS Take 1,000 Units by mouth every evening.     Coenzyme Q10 (CO Q 10 PO) Take 1 capsule by mouth in the morning.     empagliflozin  (JARDIANCE ) 10 MG TABS tablet Take 1 tablet (10 mg total) by mouth daily. 90 tablet 3   folic acid  (FOLVITE ) 1 MG tablet TAKE 2 TABLETS BY MOUTH EVERY DAY 180 tablet 3   hydroxypropyl methylcellulose / hypromellose (ISOPTO TEARS / GONIOVISC) 2.5 % ophthalmic solution Place 1-2 drops into both eyes 3 (three) times daily as needed (dry/irritated eyes.).     ibuprofen (ADVIL) 200 MG tablet Take 800 mg by mouth every 8 (eight) hours as needed (pain.).     lisinopril (ZESTRIL) 5 MG tablet Take 1 tablet (5 mg total) by mouth daily. 90 tablet 3   methotrexate  (RHEUMATREX) 2.5 MG tablet Take 6 tablets (15 mg total) by mouth once a week. 72 tablet 0   metoprolol  succinate (TOPROL -XL) 50 MG 24 hr tablet Take 0.5 tablets (25 mg total) by mouth at bedtime. 30 tablet 3   Multiple Vitamins-Minerals (CENTRUM SILVER 50+MEN) TABS Take 1 tablet by mouth every evening.     Probiotic Product (PROBIOTIC PO) Take 1 capsule by mouth every evening.     No current facility-administered medications for this encounter.    Atrial Fibrillation Management history:  Previous antiarrhythmic drugs: amiodarone  Previous cardioversions: 04/28/24 Previous ablations: 10/05/24 Anticoagulation history: Eliquis    ROS- All systems are  reviewed and negative except as per the HPI above.  Physical Exam: BP (!) 170/88   Pulse (!) 51   Ht 6' (1.829 m)   Wt 91.9 kg   BMI 27.48 kg/m   GEN- The patient is well appearing, alert and oriented x 3 today.   Neck - no JVD or carotid bruit noted Lungs- Clear to ausculation bilaterally, normal work of breathing Heart- Regular bradycardic rate and rhythm, no murmurs, rubs or gallops, PMI not laterally displaced Extremities- no clubbing, cyanosis, or edema Skin - no rash or ecchymosis noted   EKG  Interpretation Date/Time:  Tuesday November 01 2024 10:30:54 EST Ventricular Rate:  51 PR Interval:  170 QRS Duration:  102 QT Interval:  462 QTC Calculation: 425 R Axis:   105  Text Interpretation: Sinus bradycardia Rightward axis Borderline ECG When compared with ECG of 05-Oct-2024 10:37, No significant change was found Confirmed by Terra Pac [id number]) on 11/01/2024 10:49:49 AM    Echo 09/22/24:  1. Left ventricular ejection fraction, by estimation, is 55 to 60%. Left  ventricular ejection fraction by 3D volume is 60 %. The left ventricle has  normal function. The left ventricle has no regional wall motion  abnormalities. Left ventricular diastolic   parameters are consistent with Grade II diastolic dysfunction  (pseudonormalization). The average left ventricular global longitudinal  strain is -20.4 %. The global longitudinal strain is normal.   2. RVSP estimated at 37 mmHg about RAP. SABRA Right ventricular systolic  function is normal. The right ventricular size is mildly enlarged. There  is mildly elevated pulmonary artery systolic pressure.   3. Left atrial size was mildly dilated.   4. Right atrial size was moderately dilated.   5. The mitral valve is normal in structure. Mild mitral valve  regurgitation. No evidence of mitral stenosis.   6. The aortic valve is tricuspid. Aortic valve regurgitation is not  visualized. No aortic stenosis is present.   7. The inferior vena cava is normal in size with greater than 50%  respiratory variability, suggesting right atrial pressure of 3 mmHg.   Conclusion(s)/Recommendation(s): Compared to prior TTE, EF has recovered.   ASSESSMENT & PLAN CHA2DS2-VASc Score = 4  The patient's score is based upon: CHF History: 1 HTN History: 1 Diabetes History: 0 Stroke History: 0 Vascular Disease History: 1 Age Score: 1 Gender Score: 0       ASSESSMENT AND PLAN: Persistent Atrial Fibrillation (ICD10:  I48.19) The patient's CHA2DS2-VASc score  is 4, indicating a 4.8% annual risk of stroke.   S/p Afib ablation on 10/05/24 by Dr. Kennyth.  Patient is currently in NSR. Stop amiodarone  on Friday. Continue Toprol  25 mg daily. Recheck Bmet today as creatinine on 10/9 was elevated.  Secondary Hypercoagulable State (ICD10:  D68.69) The patient is at significant risk for stroke/thromboembolism based upon his CHA2DS2-VASc Score of 4.  Continue Apixaban  (Eliquis ).  No missed doses.   Hypertension Trend BP at home.     Follow up with EP as scheduled.     Terra Pac, PA-C  [facility] [address] [place], [place] [address] [phone]

## 2024-11-01 NOTE — Patient Instructions (Addendum)
 Friday - stop amiodarone 

## 2024-11-01 NOTE — Addendum Note (Signed)
 Encounter addended by: Franchot Glade RAMAN, RN on: 11/01/2024 10:54 AM  Actions taken: Medication long-term status modified, Order list changed, Diagnosis association updated, Clinical Note Signed

## 2024-11-02 ENCOUNTER — Ambulatory Visit (HOSPITAL_COMMUNITY): Payer: Self-pay | Admitting: Internal Medicine

## 2024-11-02 LAB — BASIC METABOLIC PANEL WITH GFR
BUN/Creatinine Ratio: 18 (ref 10–24)
BUN: 22 mg/dL (ref 8–27)
CO2: 23 mmol/L (ref 20–29)
Calcium: 9.1 mg/dL (ref 8.6–10.2)
Chloride: 102 mmol/L (ref 96–106)
Creatinine, Ser: 1.23 mg/dL (ref 0.76–1.27)
Glucose: 84 mg/dL (ref 70–99)
Potassium: 5.1 mmol/L (ref 3.5–5.2)
Sodium: 140 mmol/L (ref 134–144)
eGFR: 62 mL/min/1.73 (ref >59)

## 2024-11-02 NOTE — Telephone Encounter (Signed)
 Ok to increase methotrexate  to 8 tablets weekly-recheck BMP with GFR in 1 month. Avoid the use of NSAIDs.

## 2024-11-07 ENCOUNTER — Encounter: Payer: Self-pay | Admitting: Family Medicine

## 2024-11-07 ENCOUNTER — Telehealth: Payer: Self-pay

## 2024-11-07 ENCOUNTER — Ambulatory Visit (INDEPENDENT_AMBULATORY_CARE_PROVIDER_SITE_OTHER): Admitting: Family Medicine

## 2024-11-07 ENCOUNTER — Other Ambulatory Visit (HOSPITAL_BASED_OUTPATIENT_CLINIC_OR_DEPARTMENT_OTHER): Payer: Self-pay

## 2024-11-07 VITALS — BP 160/62 | HR 53 | Ht 72.0 in | Wt 201.0 lb

## 2024-11-07 DIAGNOSIS — T7840XA Allergy, unspecified, initial encounter: Secondary | ICD-10-CM

## 2024-11-07 MED ORDER — TRIAMCINOLONE ACETONIDE 40 MG/ML IJ SUSP
40.0000 mg | Freq: Once | INTRAMUSCULAR | Status: AC
  Administered 2024-11-07: 40 mg via INTRAMUSCULAR

## 2024-11-07 MED ORDER — PREDNISONE 10 MG PO TABS
ORAL_TABLET | ORAL | 0 refills | Status: AC
  Filled 2024-11-07: qty 12, 6d supply, fill #0

## 2024-11-07 MED ORDER — FAMOTIDINE 20 MG PO TABS
20.0000 mg | ORAL_TABLET | Freq: Two times a day (BID) | ORAL | 0 refills | Status: AC
  Filled 2024-11-07: qty 30, 15d supply, fill #0

## 2024-11-07 NOTE — Telephone Encounter (Signed)
 Pt was already scheduled at [facility] for an acute visit

## 2024-11-07 NOTE — Patient Instructions (Signed)
 Please call RA provider regarding methotrexate  possible reaction.    If swelling does not improve or itching/swelling or symptoms worsen within 24 hours, please go to ER.

## 2024-11-07 NOTE — Progress Notes (Signed)
 Acute Care Office Visit  Subjective:   Max [name] 11-30-1951 11/07/2024  Chief Complaint  Patient presents with   Rash    Pt states he had a rash that popped up on his face 2 days ago with swelling at the eyes. Did have itching at first but no itching at this current time. Denies any pain.    HPI: Discussed the use of AI scribe software for clinical note transcription with the patient, who gave verbal consent to proceed.  History of Present Illness Max [name] is a 73 year old male with rheumatoid arthritis who presents with a rash and itching.  He developed a rash and itching that began a couple of weeks ago, initially starting with itching and swelling in his finger. Subsequently, he developed itchy nodules on the back of his head that appeared and disappeared intermittently. By Saturday, the rash had spread, and his eyes began itching, although the eye itching subsided by the following day.  He has not made any recent changes to his medications, except for an increase in methotrexate  from six to eight pills, a dosage he has taken before without issues. 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  He has been taking Benadryl , four doses yesterday and one this morning, which has helped reduce the itching but not the swelling. No shortness of breath, tongue swelling, or recent insect bites. He experienced a sore throat for a few days, which he described as unusually low in his throat.     The following portions of the patient's history were reviewed and updated as appropriate: past medical history, past surgical history, family history, social history, allergies, medications, and problem list.   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   Essential hypertension 08/26/2007   Past Medical History:  Diagnosis Date   Abdominal hernia 11/28/2015   Allergic rhinitis    Anxiety    BPV (benign positional vertigo), right 05/11/2018   Cardiac arrhythmia    Cataract    ED (erectile dysfunction)    Herpes    History of syncope    in the setting of anxiety per cardiology note   Hypertension    Kidney tumor (benign), right    sx 02/27/2015   Meningioma (HCC)    had surgery    Paroxysmal atrial fibrillation (HCC) 07/15/2012   a) CHADSVASc score 1b) On full-dose ASA and Verapamil    RA (rheumatoid arthritis) (HCC)    Seizures (HCC) 05/14/2011   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    Vasovagal syncope    Past Surgical History:  Procedure Laterality Date   ATRIAL FIBRILLATION ABLATION N/A 10/05/2024   Procedure: ATRIAL FIBRILLATION ABLATION;  Surgeon: Kennyth Chew, MD;  [facility] INVASIVE CV LAB;  Service: Cardiovascular;  Laterality: N/A;   BRAIN MENINGIOMA EXCISION  2012  CARDIOVERSION N/A 04/28/2024   Procedure: CARDIOVERSION;  Surgeon: Loni Soyla LABOR, MD;  Location: [facility] INVASIVE CV LAB;  Service: Cardiovascular;  Laterality: N/A;   CATARACT EXTRACTION W/ INTRAOCULAR LENS IMPLANT  ~ 2001   right   COLONOSCOPY  2008   EYE SURGERY  1964   right; hit w/baseball; eye hemorrhaged   FLEXIBLE SIGMOIDOSCOPY     REFRACTIVE SURGERY  ~ 2003   ROBOTIC ASSITED PARTIAL NEPHRECTOMY Right 02/29/2016   Procedure: XI ROBOTIC ASSITED PARTIAL NEPHRECTOMY;  Surgeon: Ricardo Likens, MD;  Location: [facility] ORS;  Service: Urology;  Laterality: Right;   NEEDS ULTRASOUND   Family History  Problem Relation Age of Onset   Hypertension Brother    Prostate cancer Brother    Prostate cancer Paternal Grandfather    Healthy Son    Depression Other    Hypertension Other    GI problems Other    Lung disease Other    Colon cancer Neg Hx    Esophageal cancer Neg Hx    Ulcerative colitis Neg Hx    Outpatient Medications Prior to Visit  Medication Sig Dispense Refill   acetaminophen  (TYLENOL ) 500 MG tablet Take 1,000 mg by mouth every 6 (six) hours as needed (pain.).     acyclovir  (ZOVIRAX ) 800 MG tablet TAKE 1 TABLET (800 MG TOTAL) BY MOUTH 3 (THREE) TIMES DAILY AS NEEDED. FOR OUTBREAKS 60 tablet 1   apixaban  (ELIQUIS ) 5 MG TABS tablet Take 1 tablet (5 mg total) by mouth 2 (two) times daily. 180 tablet 3   Ascorbic Acid  (VITAMIN C ) 1000 MG tablet Take 1,000 mg by mouth every evening.     Cholecalciferol (VITAMIN D3) 1000 units CAPS Take 1,000 Units by mouth every evening.     Coenzyme Q10 (CO Q 10 PO) Take 1 capsule by mouth in the morning.     empagliflozin  (JARDIANCE ) 10 MG TABS tablet Take 1 tablet (10 mg total) by mouth daily. 90 tablet 3   folic acid  (FOLVITE ) 1 MG tablet TAKE 2 TABLETS BY MOUTH EVERY DAY 180 tablet 3   hydroxypropyl methylcellulose / hypromellose (ISOPTO TEARS / GONIOVISC) 2.5 % ophthalmic solution Place 1-2 drops into both eyes 3 (three) times daily as needed (dry/irritated eyes.).     lisinopril  (ZESTRIL ) 5 MG tablet Take 1 tablet (5 mg total) by mouth daily. 90 tablet 3   methotrexate  (RHEUMATREX) 2.5 MG tablet Take 6 tablets (15 mg total) by mouth once a week. 72 tablet 0   metoprolol  succinate (TOPROL -XL) 50 MG 24 hr tablet Take 0.5 tablets (25 mg total) by mouth at bedtime. 30 tablet 3   Multiple Vitamins-Minerals (CENTRUM SILVER 50+MEN) TABS Take 1 tablet by mouth every evening.     Probiotic Product (PROBIOTIC PO) Take 1 capsule by mouth every  evening.     ibuprofen (ADVIL) 200 MG tablet Take 800 mg by mouth every 8 (eight) hours as needed (pain.). (Patient not taking: Reported on 11/07/2024)     No facility-administered medications prior to visit.   Allergies  Allergen Reactions   Vicodin [Hydrocodone-Acetaminophen ] Nausea And Vomiting     ROS: A complete ROS was performed with pertinent positives/negatives noted in the HPI. The remainder of the ROS are negative.    Objective:   Today's Vitals   11/07/24 1032 11/07/24 1114  BP: (!) 169/63 (!) 160/62  Pulse: (!) 53   SpO2: 98%   Weight: 201 lb (91.2 kg)   Height: 6' (1.829 m)     GENERAL: Well-appearing,  in NAD. Well nourished.  SKIN: Pink, warm and dry. Erythematous macular rash without pustules or vesicles present to all aspects of face. Mild periorbital edema present to bilateral eyes. No angioedema.  Head: Normocephalic. NECK: Trachea midline. Full ROM w/o pain or tenderness.  RESPIRATORY: Chest wall symmetrical. Respirations even and non-labored.  MSK: Muscle tone and strength appropriate for age.  EXTREMITIES: Without clubbing, cyanosis, or edema.  NEUROLOGIC: No motor or sensory deficits. Steady, even gait. C2-C12 intact.  PSYCH/MENTAL STATUS: Alert, oriented x 3. Cooperative, appropriate mood and affect.     Assessment & Plan:  1. Allergic reaction, initial encounter (Primary) Concern for possible dermatologic reaction to methotrexate  or unknown trigger. Given increase of methotrexate , recommend stopping medication and reach out to Rheumatologist. Pt agreeable. Will give Kenalog  40mg  IM, continue benadryl  daily and start pepcid  20mg  BID. Start prednisone  taper tomorrow. If signs of worsening reaction, patient will go to ER. Patient and family verbalized agreement.  - triamcinolone  acetonide (KENALOG -40) injection 40 mg   Meds ordered this encounter  Medications   triamcinolone  acetonide (KENALOG -40) injection 40 mg   famotidine  (PEPCID ) 20 MG tablet     Sig: Take 1 tablet (20 mg total) by mouth 2 (two) times daily.    Dispense:  30 tablet    Refill:  0    Supervising Provider:   DE CUBA, RAYMOND J [[id number]]   predniSONE  (DELTASONE ) 10 MG tablet    Sig: Take 3 tablets (30 mg total) by mouth daily with breakfast for 2 days, THEN 2 tablets (20 mg total) daily with breakfast for 2 days, THEN 1 tablet (10 mg total) daily with breakfast for 2 days.    Dispense:  12 tablet    Refill:  0    Supervising Provider:   DE CUBA, RAYMOND J [[id number]]   Lab Orders  No laboratory test(s) ordered today   Return if symptoms worsen or fail to improve.    Patient to reach out to office if new, worrisome, or unresolved symptoms arise or if no improvement in patient's condition. Patient verbalized understanding and is agreeable to treatment plan. All questions answered to patient's satisfaction.    Thersia Schuyler Stark, [place]

## 2024-11-07 NOTE — Telephone Encounter (Signed)
 Attempted to contact the patient and left a message to call the office back.

## 2024-11-07 NOTE — Telephone Encounter (Signed)
Already scheduled for appointment today

## 2024-11-07 NOTE — Telephone Encounter (Signed)
 The patient has been on methotrexate  for many years--it is very unlikely that the rash is related to methotrexate  use. I also spoke with our pharmacist Sherry Pennant, [facility] who was in agreement.   He can try reducing the dose of methotrexate  back to 6 tablets weekly with next dose.  He can notify us  if this reaction occurs again.

## 2024-11-07 NOTE — Telephone Encounter (Signed)
 Patient's wife, Bruna, contacted the office and states the patient is being seen [facility]. Bruna states the patient saw a nurse practitioner who belives the patient is broken out with hives. Bruna states the patient has eye and facial swelling. Pat states the nurse practitioner did not know if the reaction was to the methotrexate  possibly. Bruna states the patient did just increase the dose of methotrexate . Pat wanted to let Waddell know what was going on. Please advise.

## 2024-11-08 NOTE — Telephone Encounter (Signed)
 Patient advised he has been on methotrexate  for many years--it is very unlikely that the rash is related to methotrexate  use. Waddell Craze, PA-C also spoke with our pharmacist Sherry Pennant, [facility] who was in agreement.   He can try reducing the dose of methotrexate  back to 6 tablets weekly with next dose.  He can notify us  if this reaction occurs again. Patient verbalized understanding.

## 2024-11-16 ENCOUNTER — Ambulatory Visit: Payer: Self-pay

## 2024-11-16 NOTE — Telephone Encounter (Signed)
 Noted- Appointment wit Hudnell, S tomorrow.

## 2024-11-16 NOTE — Telephone Encounter (Signed)
 FYI Only or Action Required?: FYI only for provider: appointment scheduled on 11/17/24.  Patient was last seen in primary care on 11/07/2024 by Knute Thersia Bitters, FNP.  Called Nurse Triage reporting Urticaria.  Symptoms began several weeks ago.  Interventions attempted: OTC medications: Oral benadryl , cortisone cream and benadryl  cream and Prescription medications: Prednisone , Pepcid .  Symptoms are: gradually worsening.  Triage Disposition: See PCP When Office is Open (Within 3 Days)  Patient/caregiver understands and will follow disposition?:   Copied from CRM #[id number]. Topic: Clinical - Red Word Triage >> Nov 16, 2024 10:41 AM Max [name] wrote: Red Word that prompted transfer to Nurse Triage: Patient broke out in hives and his eyes were swollen shut last week.  He now has hives on his neck and arms, causing an itch. Benadryl  not working. Received treatment on 11/07/24 Reason for Disposition  Hives persist > 1 week  Answer Assessment - Initial Assessment Questions Pt with onset of hives 2 weeks ago that started out on head, has now spread to neck, chest, arms, fingers, palms and groin. Denies SOB, wheezing or angioedema. Has not introduced and new personal or home products, medications or other potential allergens Went to [facility] on 11/24 initially for hives. Got a steroid injection and was prescribed prednisone  and Pepcid . Didn't really help. Done with prednisone  now, still taking pepcid . Taking oral benadryl  and applying topical coritsone and benadryl  cream. Scheduled appt with different provider at home office, pt would prefer not to wait until Friday to be seen by PCP. Advised UC or ED for worsening symptoms.   1. APPEARANCE: What does the rash look like?      Red splotchy   2. LOCATION: Where is the rash located?      Neck and chest, arms, palms, fingers, groin. Spots on head have mostly resolved.  3. NUMBER: How many hives are there?      Too many to count  4.  SIZE: How big are the hives? (e.g., inches, cm, compare to coins) Do they all look the same or do they vary in shape and size?      Multiple large patches, hard to measure  5. ONSET: When did the hives begin? (e.g., hours or days ago)      2 weeks ago, Started out as bumps on head. On 11/24 eyes were swelling. Pt went to [facility]. Got steroid injection, Pepcid  and prednisone . Finished prednisone .   6. ITCHING: Does it itch? If Yes, ask: How bad is the itch?  (e.g., none, mild, moderate, severe)     Severe  7. RECURRENT PROBLEM: Have you had hives before? If Yes, ask: When was the last time? and What happened that time?      Onset 2 weeks ago  8. TRIGGERS: Were you exposed to any new food, plant, cosmetic product or animal just before the hives began?     Denies  9. OTHER SYMPTOMS: Do you have any other symptoms? (e.g., fever, tongue swelling, difficulty breathing, abdomen pain)     Denies wheezing or SOB or angioedema. Swelling and itching  Protocols used: Hives-A-AH

## 2024-11-17 ENCOUNTER — Ambulatory Visit: Admitting: Family

## 2024-11-17 ENCOUNTER — Encounter: Payer: Self-pay | Admitting: Family

## 2024-11-17 VITALS — BP 130/62 | HR 63 | Temp 98.2°F | Ht 72.0 in | Wt 200.8 lb

## 2024-11-17 DIAGNOSIS — L509 Urticaria, unspecified: Secondary | ICD-10-CM | POA: Diagnosis not present

## 2024-11-17 MED ORDER — TRIAMCINOLONE ACETONIDE 0.1 % EX CREA: 1.0000 | TOPICAL_CREAM | Freq: Two times a day (BID) | CUTANEOUS | 0 refills | Status: AC

## 2024-11-17 MED ORDER — PREDNISONE 20 MG PO TABS: ORAL_TABLET | ORAL | 0 refills | Status: DC

## 2024-11-17 NOTE — Patient Instructions (Signed)
 It was very nice to see you today!   Keep taking the Famotidine  twice a day until rash better. Look for Loratadine (Claritin) over the counter and take 1 pill daily until rash better. I have sent in more prednisone  pills - follow directions on the bottle. I have also sent in a steroid cream to apply to the rash twice a day. Call back if sx are still persisting!      PLEASE NOTE:  If you had any lab tests please let us  know if you have not heard back within a few days. You may see your results on MyChart before we have a chance to review them but we will give you a call once they are reviewed by us . If we ordered any referrals today, please let us  know if you have not heard from their office within the next week.

## 2024-11-17 NOTE — Progress Notes (Signed)
 Patient ID: HANI CAMPUSANO, male    DOB: 25-Oct-1951, 73 y.o.   MRN: [id number]  Chief Complaint  Patient presents with   Rash    Pt c/o rash on bilateral hands, face, neck and back of head, present for 2 weeks. Rash resolved on other areas except bilateral hands and bilateral arms. Has tried prednisone , prescribed on 11/24.   Discussed the use of AI scribe software for clinical note transcription with the patient, who gave verbal consent to proceed.  History of Present Illness JULIA KULZER is a 73 year old male with rheumatoid arthritis and atrial fibrillation who presents with a persistent rash and swelling.  He developed a rash on November 24th, starting as knots on the back of his head that resolved, followed by swelling of a finger he thought was from rheumatoid arthritis. The rash then spread to his face with marked periorbital swelling, then to his neck and arms. The arm rash persists and is worse on one side. It is itchy and red, and the skin is sore from scratching.  He was treated at another office with a course of prednisone , famotidine  twice daily, and a triamcinolone  injection. He has finished the prednisone  and still takes famotidine . He uses Benadryl  as needed for itching but it causes drowsiness. He did not receive a topical cream.  He denies new medications, foods, or environmental exposures around onset. He has had no recent exposure to isocyanate hardener, which previously triggered his atrial fibrillation.  Assessment & Plan Urticaria of unknown origin Urticaria with no identifiable trigger. Prednisone  well tolerated. - Prescribed prednisone  20 mg daily in the morning, 50mg  x1d, 40mg  x3d, 20mg  x 2d - Continue famotidine  as previously prescribed until rash is better. - Prescribed Triamcinolone  0.1% cream for topical use. - Recommended loratadine 10mg  daily for additional antihistamine coverage until rash is better. - Advised hydration to counteract dry mouth  from medications. - Call office if sx are still persisting  or worsening.   Subjective:    Outpatient Medications Prior to Visit  Medication Sig Dispense Refill   acetaminophen  (TYLENOL ) 500 MG tablet Take 1,000 mg by mouth every 6 (six) hours as needed (pain.).     acyclovir  (ZOVIRAX ) 800 MG tablet TAKE 1 TABLET (800 MG TOTAL) BY MOUTH 3 (THREE) TIMES DAILY AS NEEDED. FOR OUTBREAKS 60 tablet 1   apixaban  (ELIQUIS ) 5 MG TABS tablet Take 1 tablet (5 mg total) by mouth 2 (two) times daily. 180 tablet 3   Ascorbic Acid  (VITAMIN C ) 1000 MG tablet Take 1,000 mg by mouth every evening.     Cholecalciferol (VITAMIN D3) 1000 units CAPS Take 1,000 Units by mouth every evening.     Coenzyme Q10 (CO Q 10 PO) Take 1 capsule by mouth in the morning.     empagliflozin  (JARDIANCE ) 10 MG TABS tablet Take 1 tablet (10 mg total) by mouth daily. 90 tablet 3   famotidine  (PEPCID ) 20 MG tablet Take 1 tablet (20 mg total) by mouth 2 (two) times daily. 30 tablet 0   folic acid  (FOLVITE ) 1 MG tablet TAKE 2 TABLETS BY MOUTH EVERY DAY 180 tablet 3   hydroxypropyl methylcellulose / hypromellose (ISOPTO TEARS / GONIOVISC) 2.5 % ophthalmic solution Place 1-2 drops into both eyes 3 (three) times daily as needed (dry/irritated eyes.).     lisinopril  (ZESTRIL ) 5 MG tablet Take 1 tablet (5 mg total) by mouth daily. 90 tablet 3   methotrexate  (RHEUMATREX) 2.5 MG tablet Take 6 tablets (15  mg total) by mouth once a week. 72 tablet 0   metoprolol  succinate (TOPROL -XL) 50 MG 24 hr tablet Take 0.5 tablets (25 mg total) by mouth at bedtime. 30 tablet 3   Multiple Vitamins-Minerals (CENTRUM SILVER 50+MEN) TABS Take 1 tablet by mouth every evening.     Probiotic Product (PROBIOTIC PO) Take 1 capsule by mouth every evening.     ibuprofen (ADVIL) 200 MG tablet Take 800 mg by mouth every 8 (eight) hours as needed (pain.). (Patient not taking: Reported on 11/07/2024)     No facility-administered medications prior to visit.   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    Vasovagal syncope    Past Surgical History:  Procedure Laterality Date   ATRIAL FIBRILLATION ABLATION N/A 10/05/2024   Procedure: ATRIAL FIBRILLATION ABLATION;  Surgeon: Kennyth Chew, MD;  Location: [facility] INVASIVE CV LAB;  Service: Cardiovascular;  Laterality: N/A;   BRAIN MENINGIOMA EXCISION  2012   CARDIOVERSION N/A 04/28/2024   Procedure: CARDIOVERSION;  Surgeon: Loni Soyla LABOR, MD;  Location: [facility] INVASIVE CV LAB;  Service: Cardiovascular;  Laterality: N/A;   CATARACT EXTRACTION W/ INTRAOCULAR LENS IMPLANT  ~ 2001   right   COLONOSCOPY  2008   EYE SURGERY  1964   right; hit w/baseball; eye hemorrhaged   FLEXIBLE SIGMOIDOSCOPY     REFRACTIVE SURGERY  ~ 2003   ROBOTIC ASSITED PARTIAL NEPHRECTOMY Right 02/29/2016   Procedure: XI ROBOTIC ASSITED PARTIAL NEPHRECTOMY;  Surgeon: Ricardo Likens, MD;  Location: [facility] ORS;  Service: Urology;  Laterality: Right;   NEEDS ULTRASOUND   Allergies  Allergen Reactions   Vicodin [Hydrocodone-Acetaminophen ] Nausea And Vomiting      Objective:    Physical Exam Vitals and nursing note reviewed.  Constitutional:      General: He is not in acute distress.    Appearance: Normal appearance.  HENT:     Head: Normocephalic.  Cardiovascular:     Rate and Rhythm: Normal rate and regular rhythm.  Pulmonary:     Effort: Pulmonary effort is  normal.     Breath sounds: Normal breath sounds.  Musculoskeletal:        General: Normal range of motion.     Cervical back: Normal range of motion.  Skin:    General: Skin is warm and dry.     Findings: Rash (red pinpoint bumps on bilateral anterior arms, itching on left palm with mild erythema, believe mainly d/t scratching) present.  Neurological:     Mental Status: He is alert and oriented to person, place, and time.  Psychiatric:        Mood and Affect: Mood normal.    BP 130/62 (BP Location: Left Arm, Patient Position: Sitting, Cuff Size: Large)   Pulse 63   Temp 98.2 F (36.8 C) (Temporal)  Ht 6' (1.829 m)   Wt 200 lb 12.8 oz (91.1 kg)   SpO2 96%   BMI 27.23 kg/m  Wt Readings from Last 3 Encounters:  11/17/24 200 lb 12.8 oz (91.1 kg)  11/07/24 201 lb (91.2 kg)  11/01/24 202 lb 9.6 oz (91.9 kg)       Lucius Krabbe, NP

## 2024-11-22 ENCOUNTER — Encounter: Payer: Self-pay | Admitting: *Deleted

## 2024-11-24 ENCOUNTER — Ambulatory Visit: Attending: Internal Medicine | Admitting: Internal Medicine

## 2024-11-24 ENCOUNTER — Encounter: Payer: Self-pay | Admitting: Internal Medicine

## 2024-11-24 VITALS — BP 178/73 | HR 54 | Ht 72.0 in | Wt 198.6 lb

## 2024-11-24 DIAGNOSIS — R001 Bradycardia, unspecified: Secondary | ICD-10-CM | POA: Diagnosis not present

## 2024-11-24 DIAGNOSIS — I5020 Unspecified systolic (congestive) heart failure: Secondary | ICD-10-CM | POA: Diagnosis not present

## 2024-11-24 DIAGNOSIS — R0683 Snoring: Secondary | ICD-10-CM

## 2024-11-24 DIAGNOSIS — I10 Essential (primary) hypertension: Secondary | ICD-10-CM

## 2024-11-24 DIAGNOSIS — I480 Paroxysmal atrial fibrillation: Secondary | ICD-10-CM | POA: Diagnosis not present

## 2024-11-24 DIAGNOSIS — I4819 Other persistent atrial fibrillation: Secondary | ICD-10-CM | POA: Diagnosis not present

## 2024-11-24 DIAGNOSIS — D6869 Other thrombophilia: Secondary | ICD-10-CM

## 2024-11-24 DIAGNOSIS — Z79899 Other long term (current) drug therapy: Secondary | ICD-10-CM | POA: Diagnosis not present

## 2024-11-24 DIAGNOSIS — E785 Hyperlipidemia, unspecified: Secondary | ICD-10-CM | POA: Diagnosis not present

## 2024-11-24 DIAGNOSIS — I2699 Other pulmonary embolism without acute cor pulmonale: Secondary | ICD-10-CM | POA: Diagnosis not present

## 2024-11-24 DIAGNOSIS — I5022 Chronic systolic (congestive) heart failure: Secondary | ICD-10-CM | POA: Diagnosis not present

## 2024-11-24 MED ORDER — AMLODIPINE BESYLATE 5 MG PO TABS: 5.0000 mg | ORAL_TABLET | Freq: Every day | ORAL | 3 refills | Status: DC

## 2024-11-24 NOTE — Progress Notes (Signed)
 Cardiology Office Note:  .   Date:  11/24/2024  ID:  [name], DOB 1951/05/22, MRN [id number] PCP: Kennyth Worth HERO, MD  [facility] Providers Cardiologist:  Soyla DELENA Merck, MD Electrophysiologist:  Fonda Kennyth, MD    History of Present Illness: .   SHAQUELLE HERNON is a 73 y.o. male.  Discussed the use of AI scribe software for clinical note transcription with the patient, who gave verbal consent to proceed.  History of Present Illness TAIQUAN CAMPANARO is a 74 year old male with atrial fibrillation and heart failure who presents for follow-up after atrial fib/flutter ablation.  He has heart failure with reduced ejection fraction, bilateral pulmonary embolism, aortic atherosclerosis, hypertension, rheumatoid arthritis, pulmonary nodule, and atrial fibrillation. He underwent cardioversion in May with early recurrence of atrial fibrillation and then atrial fibrillation and atrial flutter ablation on October 05, 2024. At the last AFib clinic visit there was no recurrence of atrial fibrillation. He was in sinus rhythm one month ago, and amiodarone  was stopped at the end of November. He continues Eliquis  5 mg BID.  Recently he developed a diffuse pruritic rash with swelling of the face that spread to chest, palms, and forearms. There was no joint pain, lip or tongue swelling, or dry cough. The rash resolved after treatment with prednisone .  For heart failure with reduced ejection fraction he continues lisinopril  5 mg daily, metoprolol  succinate 25 mg daily, and Jardiance  10 mg daily. An echocardiogram on September 22, 2024, showed normal ejection fraction and biventricular function.  For hypertension he continues lisinopril  5 mg daily and metoprolol  succinate 25 mg daily. Home blood pressures have been elevated since April with readings around 134 mmHg. He previously used verapamil  for symptomatic PACs and limits caffeine intake.  He had a prior sleep study without  evidence of sleep apnea.    ROS: negative except per HPI above.  Studies Reviewed: SABRA   EKG Interpretation Date/Time:  Thursday November 24 2024 09:01:06 EST Ventricular Rate:  54 PR Interval:  150 QRS Duration:  96 QT Interval:  436 QTC Calculation: 413 R Axis:   72  Text Interpretation: Sinus bradycardia When compared with ECG of 01-Nov-2024 10:30, QRS axis Shifted left Confirmed by Merck Soyla ([id number]) on 11/24/2024 9:09:08 AM    Results LABS Sodium: 140 Potassium: 5.1 Creatinine: 1.2 GFR: 62 MCV: 105  DIAGNOSTIC Echocardiogram: Normalization of ejection fraction; biventricular function normal (09/22/2024) Sleep study: No evidence of sleep apnea EKG: Sinus rhythm (11/24/2024) Risk Assessment/Calculations:    CHA2DS2-VASc Score = 4   This indicates a 4.8% annual risk of stroke. The patient's score is based upon: CHF History: 1 HTN History: 1 Diabetes History: 0 Stroke History: 0 Vascular Disease History: 1 Age Score: 1 Gender Score: 0      Physical Exam:   VS:  BP (!) 178/73 (BP Location: Left Arm, Patient Position: Sitting)   Pulse (!) 54   Ht 6' (1.829 m)   Wt 198 lb 9.6 oz (90.1 kg)   SpO2 95%   BMI 26.94 kg/m    Wt Readings from Last 3 Encounters:  11/24/24 198 lb 9.6 oz (90.1 kg)  11/17/24 200 lb 12.8 oz (91.1 kg)  11/07/24 201 lb (91.2 kg)     Physical Exam GENERAL: Alert, cooperative, well developed, no acute distress. HEENT: Normocephalic, normal oropharynx, moist mucous membranes. CHEST: Clear to auscultation bilaterally, no wheezes, rhonchi, or crackles. CARDIOVASCULAR: Normal heart rate and rhythm, S1 and S2 normal without murmurs.  ABDOMEN: Soft, non-tender, non-distended, without organomegaly, normal bowel sounds. EXTREMITIES: No cyanosis or edema. NEUROLOGICAL: Cranial nerves grossly intact, moves all extremities without gross motor or sensory deficit.   ASSESSMENT AND PLAN: .    Assessment and Plan Assessment &  Plan Paroxysmal atrial fibrillation status post ablation Status post ablation on October 22nd, 2025. Currently in sinus rhythm. Prednisone  use could trigger atrial fibrillation, but rhythm remains normal. - Continue Eliquis  5 mg BID. - continue metoprolol  25 mg daily  Heart failure with recovered ejection fraction Heart failure with reduced ejection fraction, now recovered. Echocardiogram on October 9th, 2025, demonstrates normalization of ejection fraction, but it is suspected that the ejection fraction may have been reduced in the setting of acute illness with atrial fibrillation and PE. Current guidelines recommend continuation of heart failure medications. - Continue lisinopril  5 mg daily. - Continue metoprolol  succinate 25 mg daily. - Continue Jardiance  10 mg daily.  Essential hypertension Blood pressure elevated post-verapamil  discontinuation. Low suspicion of lisinopril -induced rash, but cannot entirely exclude therefore will not uptitrate . Amlodipine added for blood pressure control. - Started amlodipine 5 mg daily. - Continue lisinopril  5 mg daily and metoprolol  succ 25 mg daily - Monitor blood pressure and adjust amlodipine dose as needed.  Sinus bradycardia Present on 25 mg of metoprolol  succinate. Consider dose reduction if symptomatic bradycardia occurs. - Continue to monitor heart rate and adjust metoprolol  dose if bradycardia becomes symptomatic.  History of acute pulmonary embolism Discussed potential impact on heart function and atrial fibrillation. - Continue current management and monitoring.  Recording duration: 24 minutes      Jerrin Recore, MD, FACC

## 2024-11-24 NOTE — Patient Instructions (Signed)
 Medication Instructions:   Start: Amlodipine (Norvasc) 5 mg, one tablet, once daily  Lab Work: None  Follow-Up: At [facility], you and your health needs are our priority.  As part of our continuing mission to provide you with exceptional heart care, our providers are all part of one team.  This team includes your primary Cardiologist (physician) and Advanced Practice Providers or APPs (Physician Assistants and Nurse Practitioners) who all work together to provide you with the care you need, when you need it.  Your next appointment:   6 month(s)  Provider:   Gayatri A Acharya, MD   Other Instructions Please call us  or send a MyChart message with any Cardiology related questions/concerns.  [phone].  Thank you!

## 2024-11-30 ENCOUNTER — Encounter: Payer: Self-pay | Admitting: Internal Medicine

## 2024-11-30 DIAGNOSIS — I10 Essential (primary) hypertension: Secondary | ICD-10-CM

## 2024-11-30 DIAGNOSIS — Z79899 Other long term (current) drug therapy: Secondary | ICD-10-CM

## 2024-11-30 DIAGNOSIS — I5022 Chronic systolic (congestive) heart failure: Secondary | ICD-10-CM

## 2024-12-01 ENCOUNTER — Telehealth: Payer: Self-pay | Admitting: Internal Medicine

## 2024-12-01 NOTE — Telephone Encounter (Signed)
 Spoke with pt's wife (per DPR) and notified her that Dr. Loni has not yet gotten back to us  with her suggestions. Pt's wife was told that someone will reach out once we hear from her. Pt's wife verbalized understanding. All questions if any were answered.

## 2024-12-01 NOTE — Telephone Encounter (Signed)
 Wife is calling for update in regards to the mychart message about the patient rash. Please advised

## 2024-12-02 MED ORDER — AMLODIPINE BESYLATE 10 MG PO TABS: 10.0000 mg | ORAL_TABLET | Freq: Every day | ORAL | 1 refills | Status: AC

## 2024-12-02 NOTE — Addendum Note (Signed)
 Addended by: Myrissa Chipley C on: 12/02/2024 05:53 PM   Modules accepted: Orders

## 2024-12-02 NOTE — Telephone Encounter (Signed)
 LVM (detailed) and sent [facility] msg with rec's. Will need to update meds/send in RX and enter referral if pt ok. 12/02/24

## 2024-12-20 ENCOUNTER — Ambulatory Visit: Attending: Cardiology

## 2024-12-20 ENCOUNTER — Telehealth: Payer: Self-pay | Admitting: *Deleted

## 2024-12-20 VITALS — BP 147/74 | HR 97

## 2024-12-20 DIAGNOSIS — I5020 Unspecified systolic (congestive) heart failure: Secondary | ICD-10-CM | POA: Diagnosis not present

## 2024-12-20 NOTE — Progress Notes (Addendum)
 Patient ID: Max [name]                 DOB: 02/26/1951                      MRN: [id number]     HPI: Max [name] is a 74 y.o. male referred by Dr. Loni to pharmacy clinic for HF medication management. PMH is significant for HFrEF most recent echo showed normal ejection fraction (LVEF 55-60% on 09/2024), bilateral PE, aortic atherosclerosis by CT imaging, seizure, anxiety and atrial fibrillation.  The patient was evaluated by Dr. Loni in December 2025 for follow-up after atrial fibrillation/flutter ablation performed in October 2025. During that visit, he reported a diffuse pruritic rash with facial swelling that extended to the chest, palms, and forearms. He denied joint pain, lip or tongue swelling, and dry cough. The rash resolved after treatment with prednisone . The patient attributed the reaction to lisinopril , which was discontinued, and his provider transitioned him to amlodipine  10 mg daily, which he is tolerating well.  Today, he presents in good spirits accompanied by his wife. Current medications include metoprolol  succinate 50 mg daily, Jardiance  10 mg daily and amlodipine  10 mg daily for HF/HTN management. He reports no issues with these medications but notes occasional rashes, uncertain if lisinopril  was the culprit. Symptoms have improved since discontinuation, and he uses a prescription ointment effectively during flares.  The patient monitors blood pressure at home and brought a log showing averages 133/67 though some readings were taken after morning caffeine intake. He reports improved blood pressure control since starting amlodipine . BP at last visit was elevated at 178/73, patient reports Honour Schwieger coat HTN but noted it is never that high.  Notably, after chart review, he was previously on spironolactone  and Entresto  after discharge (03/2024) for AF but these were discontinued due to hypotension and fatigue.  His wife asked about the rationale for anticoagulation and  expressed concern that inhalation of paint chemicals may have triggered AF. We reviewed his CHA?DS?-VASc score, history of PE and AF, and emphasized the importance of anticoagulation with a DOAC.  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 Symptomatically, he is feeling well overall. Denies dizziness, lightheadedness, and fatigue. Denies chest pain or palpitations. Denies SOB. Able to complete all ADLs. Activity level normal. He checks her weight at home (normal range 192- 195 lbs. Denies LEE, PND, or orthopnea. Appetite has been normal. He adheres to a low-salt diet.  Current CHF meds: metoprolol  succinate 50 mg daily, Jardiance  10 mg daily  HTN meds: amlodipine  5 mg daily Previously tried: lisinopril  (hives), Entresto  (low bp), spironolactone  (low bp) Labs (10/2024): K 5.1, Scr 1.23, Crcl 61 ml/min   Adherence Assessment  Do you ever forget to take your medication? [x] Yes, occasionally  [x] No  Do you ever skip doses due to side effects? [] Yes [x] No  Do you have trouble affording your medicines? [] Yes [x] No  Are you ever unable to pick up your medication due to transportation difficulties? [] Yes [x] No  Do you ever stop taking your medications because you don't believe they are helping? [] Yes [x] No  Do you check your weight daily? [x] Yes, [] No   Adherence strategy: Patient remembers and has a pill box  Barriers to  obtaining medications: none  BP goal: < 130/80  Family History:  Relation Problem Comments  Mother (Deceased)   Father (Deceased)   Brother - JEFF (Alive) Hypertension   Prostate cancer     Paternal Grandfather Prostate cancer     Other Depression   GI  problems   Hypertension   Lung disease     Son Metallurgist) Healthy     Neg Hx Colon cancer   Esophageal cancer   Ulcerative colitis      Social History:  Alcohol:  never Smoking: never  Diet:  Patient confirms low salt diet and reports that he doesn't drink nearly enough water   Exercise: He does not follow a structured exercise regimen but 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   Home BP readings:  Prior to adding amlodipine  (Early 11/2024)  139/67 143/71 139/68 136/66 136/65 132/62 139/72 Average: 138/67   After adding amlodipine  (12/12/2024 - 12-19-2024) 128/61 70 139/69 58 138/68 69 126/64 63 135/71 64 134/71 64 138/66 67 136/66 63 128/61 64 129/73 54 137/70 61 136/71 55 128/67 65  Average: 133/67   Wt Readings from Last 3 Encounters:  11/24/24 198 lb 9.6 oz (90.1 kg)  11/17/24 200 lb 12.8 oz (91.1 kg)  11/07/24 201 lb (91.2 kg)   BP Readings from Last 3 Encounters:  12/20/24 (!) 147/74  11/24/24 (!) 178/73  11/17/24 130/62   Pulse Readings from Last 3 Encounters:  12/20/24 97  11/24/24 (!) 54  11/17/24 63    Renal function: CrCl cannot be calculated (Patient's most recent lab result is older than the maximum 21 days allowed.).  Past Medical History:  Diagnosis Date   Abdominal hernia 11/28/2015   Allergic rhinitis    Anxiety    BPV (benign positional vertigo), right 05/11/2018   Cardiac arrhythmia    Cataract    ED (erectile dysfunction)    Herpes    History of syncope    in the setting of anxiety per cardiology note   Hypertension    Kidney tumor (benign), right    sx 02/27/2015   Meningioma (HCC)    had surgery    Paroxysmal atrial fibrillation (HCC) 07/15/2012   a) CHADSVASc score 1b) On full-dose ASA and Verapamil    RA (rheumatoid arthritis) (HCC)    Seizures (HCC) 05/14/2011   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    Vasovagal syncope     Medications Ordered Prior to  Encounter[1]  Allergies[2]   Assessment/Plan:  1. CHF -  HFrEF (heart failure with reduced ejection fraction) (HCC) Assessment & Plan: Assessment: BP is uncontrolled in office BP 147/ 74 mmHg above the goal (<130/80). Home BP improved since the addition of amlodipine  but SBP averages slightly above goal at 133/67, though some readings were taken shortly after caffeine intake  Tolerates metoprolol  succinate 50 mg daily, Jardiance  10 mg daily and amlodipine  5 mg daily well without any side        effects Lisinopril  discontinued due to rash  Previously on spironolactone  and Entresto  after discharge for AF (03/2024); discontinued due to hypotension and fatigue. Discussed strategies to further lower BP, including initiating a low-dose ARB (with caution given potassium at the        higher end of normal), switching to carvedilol, or increasing Jardiance  to 25 mg. Would adding an MRA on top of the          Above options due to the patients history of hypotension  and K. Patient is motivated to work on lifestyle modifications: diet, exercise, and checking BP prior to morning coffee. Denies shortness of breath, palpitations, chest pain, headaches, or swelling. Reinforced importance of regular exercise and a low-sodium diet.   Plan:  Continue taking metoprolol  succinate 50 mg daily, Jardiance  10 mg daily and amlodipine  10 mg daily Instructed patient to avoid drinking caffeinated beverages for at least 30 minutes before BP checks and encouraged aerobic exercise  Patient to keep record of BP readings with heart rate and report to us  at the next visit Patient to bring BP monitor to next visit to validate  Patient to see PharmD in 4-5 weeks for follow up     Thank you   Hiedi Touchton E. Shantese Raven, Pharm.D, CPP [facility]. [facility] [address], [place], [place] [address] Phone: [phone]; Fax: [phone]      [1]  Current Outpatient Medications  on File Prior to Visit  Medication Sig Dispense Refill   acetaminophen  (TYLENOL ) 500 MG tablet Take 1,000 mg by mouth every 6 (six) hours as needed (pain.).     acyclovir  (ZOVIRAX ) 800 MG tablet TAKE 1 TABLET (800 MG TOTAL) BY MOUTH 3 (THREE) TIMES DAILY AS NEEDED. FOR OUTBREAKS 60 tablet 1   amLODipine  (NORVASC ) 10 MG tablet Take 1 tablet (10 mg total) by mouth daily. 90 tablet 1   apixaban  (ELIQUIS ) 5 MG TABS tablet Take 1 tablet (5 mg total) by mouth 2 (two) times daily. 180 tablet 3   Ascorbic Acid  (VITAMIN C ) 1000 MG tablet Take 1,000 mg by mouth every evening.     Cholecalciferol (VITAMIN D3) 1000 units CAPS Take 1,000 Units by mouth every evening.     Coenzyme Q10 (CO Q 10 PO) Take 1 capsule by mouth in the morning.     empagliflozin  (JARDIANCE ) 10 MG TABS tablet Take 1 tablet (10 mg total) by mouth daily. 90 tablet 3   famotidine  (PEPCID ) 20 MG tablet Take 1 tablet (20 mg total) by mouth 2 (two) times daily. 30 tablet 0   folic acid  (FOLVITE ) 1 MG tablet TAKE 2 TABLETS BY MOUTH EVERY DAY 180 tablet 3   hydroxypropyl methylcellulose / hypromellose (ISOPTO TEARS / GONIOVISC) 2.5 % ophthalmic solution Place 1-2 drops into both eyes 3 (three) times daily as needed (dry/irritated eyes.).     methotrexate  (RHEUMATREX) 2.5 MG tablet Take 6 tablets (15 mg total) by mouth once a week. 72 tablet 0   metoprolol  succinate (TOPROL -XL) 50 MG 24 hr tablet Take 0.5 tablets (25 mg total) by mouth at bedtime. 30 tablet 3   Multiple Vitamins-Minerals (CENTRUM SILVER 50+MEN) TABS Take 1 tablet by mouth every evening.     Probiotic Product (PROBIOTIC PO) Take 1 capsule by mouth every evening.     triamcinolone  cream (KENALOG ) 0.1 % Apply 1 Application topically 2 (two) times daily. 30 g 0   TURMERIC EXTRA STRENGTH PO Take 1,500 mg by mouth daily.     No current facility-administered medications on file prior to visit.  [2]  Allergies Allergen Reactions   Vicodin Freedom.furrow ] Nausea And  Vomiting   Lisinopril  Hives

## 2024-12-20 NOTE — Assessment & Plan Note (Signed)
 Assessment: BP is uncontrolled in office BP 147/ 74 mmHg above the goal (<130/80). Home BP improved since the addition of amlodipine  but SBP averages slightly above goal at 133/67, though some readings were taken shortly after caffeine intake  Tolerates metoprolol  succinate 50 mg daily, Jardiance  10 mg daily and amlodipine  5 mg daily well without any side        effects Lisinopril  discontinued due to rash  Previously on spironolactone  and Entresto  after discharge for AF (03/2024); discontinued due to hypotension and fatigue. Discussed strategies to further lower BP, including initiating a low-dose ARB (with caution given potassium at the        higher end of normal), switching to carvedilol, or increasing Jardiance  to 25 mg. Would adding an MRA on top of the          Above options due to the patients history of hypotension and K. Patient is motivated to work on lifestyle modifications: diet, exercise, and checking BP prior to morning coffee. Denies shortness of breath, palpitations, chest pain, headaches, or swelling. Reinforced importance of regular exercise and a low-sodium diet.   Plan:  Continue taking metoprolol  succinate 50 mg daily, Jardiance  10 mg daily and amlodipine  10 mg daily Instructed patient to avoid drinking caffeinated beverages for at least 30 minutes before BP checks and encouraged aerobic exercise  Patient to keep record of BP readings with heart rate and report to us  at the next visit Patient to bring BP monitor to next visit to validate  Patient to see PharmD in 4-5 weeks for follow up

## 2024-12-20 NOTE — Patient Instructions (Addendum)
 Changes made by your pharmacist Verdean Murin E. Rafal Archuleta, PharmD, CPP at today's visit:    Instructions/Changes  (what do you need to do) Your Notes  (what you did and when you did it)  Continue metoprolol  succinate 50 mg daily, Jardiance  10 mg daily and amlodipine  5 mg daily    2. Check blood pressure and heart rate    3. PharmD follow up appointment: I will call      to schedule follow up    Bring all of your meds, your BP cuff and your record of home blood pressures to your next appointment.   Max [name] E. Max [name], Pharm.D, CPP [place] [facility]. [facility] [address], [place], [place] [address] Phone: [phone]; Fax: [phone]     HOW TO TAKE YOUR BLOOD PRESSURE AT HOME  Rest 5 minutes before taking your blood pressure.  Dont smoke or drink caffeinated beverages for at least 30 minutes before. Take your blood pressure before (not after) you eat. Sit comfortably with your back supported and both feet on the floor (dont cross your legs). Elevate your arm to heart level on a table or a desk. Use the proper sized cuff. It should fit smoothly and snugly around your bare upper arm. There should be enough room to slip a fingertip under the cuff. The bottom edge of the cuff should be 1 inch above the crease of the elbow. Ideally, take 3 measurements at one sitting and record the average.  Important lifestyle changes to control high blood pressure  Intervention  Effect on the BP  Lose extra pounds and watch your waistline Weight loss is one of the most effective lifestyle changes for controlling blood pressure. If you're overweight or obese, losing even a small amount of weight can help reduce blood pressure. Blood pressure might go down by about 1 millimeter of mercury (mm Hg) with each kilogram (about 2.2 pounds) of weight lost.  Exercise regularly As a general goal, aim for at least 30 minutes of moderate physical activity every day. Regular  physical activity can lower high blood pressure by about 5 to 8 mm Hg.  Eat a healthy diet Eating a diet rich in whole grains, fruits, vegetables, and low-fat dairy products and low in saturated fat and cholesterol. A healthy diet can lower high blood pressure by up to 11 mm Hg.  Reduce salt (sodium) in your diet Even a small reduction of sodium in the diet can improve heart health and reduce high blood pressure by about 5 to 6 mm Hg.  Limit alcohol One drink equals 12 ounces of beer, 5 ounces of wine, or 1.5 ounces of 80-proof liquor.  Limiting alcohol to less than one drink a day for women or two drinks a day for men can help lower blood pressure by about 4 mm Hg.   If you have any questions or concerns please use My Chart to send questions or call the office at [phone]

## 2024-12-20 NOTE — Telephone Encounter (Signed)
-----   Message from Nurse Buel HERO, RN sent at 11/24/2024  9:45 AM EST ----- Regarding: Call pt for BP update Call pt to ask how his BP's are doing after starting Amlodipine  5 mg once daily on 11/24/24  Saw Acharya, MD

## 2024-12-23 ENCOUNTER — Other Ambulatory Visit: Payer: Self-pay | Admitting: Physician Assistant

## 2024-12-23 ENCOUNTER — Telehealth: Payer: Self-pay

## 2024-12-23 NOTE — Telephone Encounter (Signed)
 Last Fill: 10/03/2024  Labs: 09/22/2024 MCV 105, MCH 34.6, Creat. 1.36, GFR 55  Next Visit: 01/19/2025  Last Visit: 08/18/2024  DX: Rheumatoid arthritis with rheumatoid factor of multiple sites without organ or systems involvement   Current Dose per office note 08/18/2024: Methotrexate  6 tablets by mouth once weekly   Patient advised he is due to update lab work. Patient states he will come Monday to update.   Okay to refill Methotrexate ?

## 2024-12-23 NOTE — Telephone Encounter (Signed)
 PharmD HTN f/u appointment scheduled

## 2024-12-26 ENCOUNTER — Other Ambulatory Visit: Payer: Self-pay

## 2024-12-26 DIAGNOSIS — Z79899 Other long term (current) drug therapy: Secondary | ICD-10-CM

## 2024-12-27 ENCOUNTER — Ambulatory Visit: Payer: Self-pay | Admitting: Rheumatology

## 2024-12-27 LAB — CBC WITH DIFFERENTIAL/PLATELET
Absolute Lymphocytes: 1009 {cells}/uL (ref 850–3900)
Absolute Monocytes: 818 {cells}/uL (ref 200–950)
Basophils Absolute: 58 {cells}/uL (ref 0–200)
Basophils Relative: 1 %
Eosinophils Absolute: 41 {cells}/uL (ref 15–500)
Eosinophils Relative: 0.7 %
HCT: 46.1 % (ref 39.4–51.1)
Hemoglobin: 15.4 g/dL (ref 13.2–17.1)
MCH: 35.3 pg — ABNORMAL HIGH (ref 27.0–33.0)
MCHC: 33.4 g/dL (ref 31.6–35.4)
MCV: 105.7 fL — ABNORMAL HIGH (ref 81.4–101.7)
MPV: 10.2 fL (ref 7.5–12.5)
Monocytes Relative: 14.1 %
Neutro Abs: 3874 {cells}/uL (ref 1500–7800)
Neutrophils Relative %: 66.8 %
Platelets: 427 Thousand/uL — ABNORMAL HIGH (ref 140–400)
RBC: 4.36 Million/uL (ref 4.20–5.80)
RDW: 13.8 % (ref 11.0–15.0)
Total Lymphocyte: 17.4 %
WBC: 5.8 Thousand/uL (ref 3.8–10.8)

## 2024-12-27 LAB — COMPREHENSIVE METABOLIC PANEL WITH GFR
AG Ratio: 1.6 (calc) (ref 1.0–2.5)
ALT: 22 U/L (ref 9–46)
AST: 22 U/L (ref 10–35)
Albumin: 4.5 g/dL (ref 3.6–5.1)
Alkaline phosphatase (APISO): 88 U/L (ref 35–144)
BUN: 20 mg/dL (ref 7–25)
CO2: 31 mmol/L (ref 20–32)
Calcium: 9.9 mg/dL (ref 8.6–10.3)
Chloride: 105 mmol/L (ref 98–110)
Creat: 1.18 mg/dL (ref 0.70–1.28)
Globulin: 2.8 g/dL (ref 1.9–3.7)
Glucose, Bld: 86 mg/dL (ref 65–99)
Potassium: 4.8 mmol/L (ref 3.5–5.3)
Sodium: 142 mmol/L (ref 135–146)
Total Bilirubin: 0.6 mg/dL (ref 0.2–1.2)
Total Protein: 7.3 g/dL (ref 6.1–8.1)
eGFR: 65 mL/min/1.73m2

## 2024-12-27 NOTE — Progress Notes (Signed)
 CBC and CMP are stable.

## 2025-01-03 ENCOUNTER — Encounter: Payer: Self-pay | Admitting: Family Medicine

## 2025-01-04 ENCOUNTER — Ambulatory Visit: Payer: Self-pay | Admitting: Student

## 2025-01-04 NOTE — Telephone Encounter (Signed)
 "  See patient note  "

## 2025-01-05 NOTE — Progress Notes (Unsigned)
 "  Office Visit Note  Patient: Max [name]             Date of Birth: Oct 05, 1951           MRN: [id number]             PCP: Kennyth Worth HERO, MD Referring: Kennyth Worth HERO, MD Visit Date: 01/19/2025 Occupation: Data Unavailable  Subjective:    History of Present Illness: Max [name] is a 74 y.o. male with history of rheumatoid arthritis. He remains on  Methotrexate  6 tablets by mouth once weekly and folic acid  2 mg daily.   CBC and CMP updated on 12/27/23.  His next lab work will be march and every 3 months.  Discussed the importance of holing methotrexate  if he develops signs or symptoms of an infection and to resume once the infection has completely cleared.    Activities of Daily Living:  Patient reports morning stiffness for *** {minute/hour:19697}.   Patient {ACTIONS;DENIES/REPORTS:21021675::Denies} nocturnal pain.  Difficulty dressing/grooming: {ACTIONS;DENIES/REPORTS:21021675::Denies} Difficulty climbing stairs: {ACTIONS;DENIES/REPORTS:21021675::Denies} Difficulty getting out of chair: {ACTIONS;DENIES/REPORTS:21021675::Denies} Difficulty using hands for taps, buttons, cutlery, and/or writing: {ACTIONS;DENIES/REPORTS:21021675::Denies}  No Rheumatology ROS completed.   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   Pulmonary nodule 01/28/2019   History of cataract 01/28/2019   Recurrent low back pain 11/28/2015   Paroxysmal atrial fibrillation (HCC) 02/04/2014   History of cold sores 07/14/2012   Rheumatoid arthritis (HCC) 11/23/2009   ERECTILE DYSFUNCTION, 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   Essential hypertension 08/26/2007    Past Medical History:  Diagnosis  Date   Abdominal hernia 11/28/2015   Allergic rhinitis    Anxiety    BPV (benign positional vertigo), right 05/11/2018   Cardiac arrhythmia    Cataract    ED (erectile dysfunction)    Herpes    History of syncope    in the setting of anxiety per cardiology note   Hypertension    Kidney tumor (benign), right    sx 02/27/2015   Meningioma (HCC)    had surgery    Paroxysmal atrial fibrillation (HCC) 07/15/2012   a) CHADSVASc score 1b) On full-dose ASA and Verapamil    RA (rheumatoid arthritis) (HCC)    Seizures (HCC) 05/14/2011   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    Vasovagal syncope     Family History  Problem Relation Age of Onset   Hypertension Brother    Prostate cancer Brother    Prostate cancer Paternal Grandfather    Healthy Son    Depression Other    Hypertension Other    GI problems Other    Lung disease Other    Colon cancer Neg Hx    Esophageal cancer Neg Hx    Ulcerative colitis Neg Hx    Past Surgical History:  Procedure Laterality Date   ATRIAL FIBRILLATION ABLATION N/A 10/05/2024   Procedure: ATRIAL FIBRILLATION ABLATION;  Surgeon: Kennyth Chew, MD;  Location: [facility];  Service: Cardiovascular;  Laterality: N/A;   BRAIN MENINGIOMA EXCISION  2012   CARDIOVERSION N/A 04/28/2024   Procedure: CARDIOVERSION;  Surgeon: Loni Soyla LABOR, MD;  Location: [facility];  Service: Cardiovascular;  Laterality: N/A;   CATARACT EXTRACTION W/  INTRAOCULAR LENS IMPLANT  ~ 2001   right   COLONOSCOPY  2008   EYE SURGERY  1964   right; hit w/baseball; eye hemorrhaged   FLEXIBLE SIGMOIDOSCOPY     REFRACTIVE SURGERY  ~ 2003   ROBOTIC ASSITED PARTIAL NEPHRECTOMY Right 02/29/2016   Procedure: XI ROBOTIC ASSITED PARTIAL NEPHRECTOMY;  Surgeon: Ricardo Likens, MD;  Location: [facility];  Service: Urology;  Laterality: Right;   NEEDS ULTRASOUND   Social History[1] Social History   Social History Narrative   Regular Exercise     Immunization History   Administered Date(s) Administered   Fluad Quad(high Dose 65+) 11/03/2020, 11/25/2022   INFLUENZA, HIGH DOSE SEASONAL PF 11/28/2015, 11/03/2024   Influenza Split 12/18/2011   Influenza Whole 10/18/2009, 10/22/2010   Influenza, Seasonal, Injecte, Preservative Fre 12/20/2012   Influenza,inj,Quad PF,6+ Mos 12/21/2013, 02/08/2015   Influenza-Unspecified 10/10/2018, 11/01/2021   PFIZER Comirnaty(Gray Top)Covid-19 Tri-Sucrose Vaccine 06/30/2021   PFIZER(Purple Top)SARS-COV-2 Vaccination 02/05/2020, 02/28/2020, 11/03/2020   Pneumococcal Conjugate-13 03/30/2017   Pneumococcal Polysaccharide-23 03/01/2016   Td 12/15/1998, 10/18/2009     Objective: Vital Signs: There were no vitals taken for this visit.   Physical Exam Vitals and nursing note reviewed.  Constitutional:      Appearance: He is well-developed.  HENT:     Head: Normocephalic and atraumatic.  Eyes:     Conjunctiva/sclera: Conjunctivae normal.     Pupils: Pupils are equal, round, and reactive to light.  Cardiovascular:     Rate and Rhythm: Normal rate and regular rhythm.     Heart sounds: Normal heart sounds.  Pulmonary:     Effort: Pulmonary effort is normal.     Breath sounds: Normal breath sounds.  Abdominal:     General: Bowel sounds are normal.     Palpations: Abdomen is soft.  Musculoskeletal:     Cervical back: Normal range of motion and neck supple.  Skin:    General: Skin is warm and dry.     Capillary Refill: Capillary refill takes less than 2 seconds.  Neurological:     Mental Status: He is alert and oriented to person, place, and time.  Psychiatric:        Behavior: Behavior normal.      Musculoskeletal Exam: ***  CDAI Exam: CDAI Score: -- Patient Global: --; Provider Global: -- Swollen: --; Tender: -- Joint Exam 01/19/2025   No joint exam has been documented for this visit   There is currently no information documented on the homunculus. Go to the Rheumatology activity and complete the  homunculus joint exam.  Investigation: No additional findings.  Imaging: No results found.  Recent Labs: Lab Results  Component Value Date   WBC 5.8 12/26/2024   HGB 15.4 12/26/2024   PLT 427 (H) 12/26/2024   NA 142 12/26/2024   K 4.8 12/26/2024   CL 105 12/26/2024   CO2 31 12/26/2024   GLUCOSE 86 12/26/2024   BUN 20 12/26/2024   CREATININE 1.18 12/26/2024   BILITOT 0.6 12/26/2024   ALKPHOS 76 03/22/2024   AST 22 12/26/2024   ALT 22 12/26/2024   PROT 7.3 12/26/2024   ALBUMIN 4.6 03/22/2024   CALCIUM  9.9 12/26/2024   GFRAA 76 05/06/2021   QFTBGOLDPLUS NEGATIVE 05/06/2021    Speciality Comments: Dxd 2011. MTX x11 yrs. off and on.  Procedures:  No procedures performed Allergies: Vicodin [hydrocodone-acetaminophen ] and Lisinopril    Assessment / Plan:     Visit Diagnoses: Rheumatoid arthritis with rheumatoid factor of multiple sites without organ or  systems involvement (HCC)  High risk medication use  Rheumatoid arthritis involving both hands with positive rheumatoid factor (HCC)  Rheumatoid arthritis involving both feet with positive rheumatoid factor (HCC)  Paroxysmal atrial fibrillation (HCC)  Defect of right cornea  Essential hypertension  Dyslipidemia  Pulmonary nodule  History of cataract  Actinic keratosis  Elevated serum creatinine  History of pulmonary embolism  Orders: No orders of the defined types were placed in this encounter.  No orders of the defined types were placed in this encounter.   Face-to-face time spent with patient was *** minutes. Greater than 50% of time was spent in counseling and coordination of care.  Follow-Up Instructions: No follow-ups on file.   W[place]ell CHRISTELLA Craze, PA-C  Note - This record has been created using Dragon software.  Chart creation errors have been sought, but may not always  have been located. Such creation errors do not reflect on  the standard of medical care.     [1]  Social History Tobacco  Use   Smoking status: Never    Passive exposure: Past   Smokeless tobacco: Never   Tobacco comments:    Never smoked 11/01/24  Vaping Use   Vaping status: Never Used  Substance Use Topics   Alcohol use: No   Drug use: No   "

## 2025-01-09 ENCOUNTER — Ambulatory Visit (INDEPENDENT_AMBULATORY_CARE_PROVIDER_SITE_OTHER): Payer: Medicare HMO

## 2025-01-09 VITALS — Ht 72.0 in | Wt 192.0 lb

## 2025-01-09 DIAGNOSIS — Z0000 Encounter for general adult medical examination without abnormal findings: Secondary | ICD-10-CM | POA: Diagnosis not present

## 2025-01-09 NOTE — Progress Notes (Signed)
 "  Chief Complaint  Patient presents with   Medicare Wellness     Subjective:   Max [name] is a 74 y.o. male who presents for a Medicare Annual Wellness Visit.  Visit info / Clinical Intake: Medicare Wellness Visit Type:: Subsequent Annual Wellness Visit Persons participating in visit and providing information:: patient Medicare Wellness Visit Mode:: Telephone If telephone:: video declined Since this visit was completed virtually, some vitals may be partially provided or unavailable. Missing vitals are due to the limitations of the virtual format.: Unable to obtain vitals - no equipment If Telephone or Video please confirm:: I connected with patient using audio/video enable telemedicine. I verified patient identity with two identifiers, discussed telehealth limitations, and patient agreed to proceed. Patient Location:: home Provider Location:: office Interpreter Needed?: No Pre-visit prep was completed: yes AWV questionnaire completed by patient prior to visit?: no Living arrangements:: lives with spouse/significant other Patient's Overall Health Status Rating: very good Typical amount of pain: none Does pain affect daily life?: no Are you currently prescribed opioids?: no  Dietary Habits and Nutritional Risks How many meals a day?: 3 Eats fruit and vegetables daily?: yes Most meals are obtained by: preparing own meals In the last 2 weeks, have you had any of the following?: none Diabetic:: no  Functional Status Activities of Daily Living (to include ambulation/medication): Independent Ambulation: Independent with device- listed below Home Assistive Devices/Equipment: Eyeglasses Medication Administration: Independent Home Management (perform basic housework or laundry): Independent Manage your own finances?: yes Primary transportation is: driving Concerns about vision?: no *vision screening is required for WTM* Concerns about hearing?: no  Fall Screening Falls in  the past year?: 0 Number of falls in past year: 0 Was there an injury with Fall?: 0 Fall Risk Category Calculator: 0 Patient Fall Risk Level: Low Fall Risk  Fall Risk Patient at Risk for Falls Due to: No Fall Risks Fall risk Follow up: Falls evaluation completed  Home and Transportation Safety: All rugs have non-skid backing?: yes All stairs or steps have railings?: yes Grab bars in the bathtub or shower?: yes Have non-skid surface in bathtub or shower?: yes Good home lighting?: yes Regular seat belt use?: yes Hospital stays in the last year:: (!) yes How many hospital stays:: 1 Reason: afib  Cognitive Assessment Difficulty concentrating, remembering, or making decisions? : no Will 6CIT or Mini Cog be Completed: yes What year is it?: 0 points What month is it?: 0 points Give patient an address phrase to remember (5 components): [address] [place]  About what time is it?: 0 points Count backwards from 20 to 1: 0 points Say the months of the year in reverse: 0 points Repeat the address phrase from earlier: 0 points 6 CIT Score: 0 points  Advance Directives (For Healthcare) Does Patient Have a Medical Advance Directive?: Yes Does patient want to make changes to medical advance directive?: No - Patient declined Type of Advance Directive: Healthcare Power of Attorney Copy of Healthcare Power of Attorney in Chart?: No - copy requested Would patient like information on creating a medical advance directive?: Yes (Inpatient - patient defers creating a medical advance directive at this time - Information given)  Reviewed/Updated  Reviewed/Updated: Reviewed All (Medical, Surgical, Family, Medications, Allergies, Care Teams, Patient Goals)    Allergies (verified) Vicodin [hydrocodone-acetaminophen ] and Lisinopril    Current Medications (verified) Outpatient Encounter Medications as of 01/09/2025  Medication Sig   acetaminophen  (TYLENOL ) 500 MG tablet Take 1,000 mg by mouth  every 6 (  six) hours as needed (pain.).   acyclovir  (ZOVIRAX ) 800 MG tablet TAKE 1 TABLET (800 MG TOTAL) BY MOUTH 3 (THREE) TIMES DAILY AS NEEDED. FOR OUTBREAKS   amLODipine  (NORVASC ) 10 MG tablet Take 1 tablet (10 mg total) by mouth daily.   apixaban  (ELIQUIS ) 5 MG TABS tablet Take 1 tablet (5 mg total) by mouth 2 (two) times daily.   Ascorbic Acid  (VITAMIN C ) 1000 MG tablet Take 1,000 mg by mouth every evening.   Cholecalciferol (VITAMIN D3) 1000 units CAPS Take 1,000 Units by mouth every evening.   Coenzyme Q10 (CO Q 10 PO) Take 1 capsule by mouth in the morning.   empagliflozin  (JARDIANCE ) 10 MG TABS tablet Take 1 tablet (10 mg total) by mouth daily.   famotidine  (PEPCID ) 20 MG tablet Take 1 tablet (20 mg total) by mouth 2 (two) times daily.   folic acid  (FOLVITE ) 1 MG tablet TAKE 2 TABLETS BY MOUTH EVERY DAY   hydroxypropyl methylcellulose / hypromellose (ISOPTO TEARS / GONIOVISC) 2.5 % ophthalmic solution Place 1-2 drops into both eyes 3 (three) times daily as needed (dry/irritated eyes.).   methotrexate  (RHEUMATREX) 2.5 MG tablet TAKE 6 TABLETS BY MOUTH ONE TIME PER WEEK   metoprolol  succinate (TOPROL -XL) 50 MG 24 hr tablet Take 0.5 tablets (25 mg total) by mouth at bedtime.   Multiple Vitamins-Minerals (CENTRUM SILVER 50+MEN) TABS Take 1 tablet by mouth every evening.   Probiotic Product (PROBIOTIC PO) Take 1 capsule by mouth every evening.   triamcinolone  cream (KENALOG ) 0.1 % Apply 1 Application topically 2 (two) times daily.   TURMERIC EXTRA STRENGTH PO Take 1,500 mg by mouth daily.   [DISCONTINUED] amiodarone  (PACERONE ) 200 MG tablet Take 200 mg by mouth daily.   No facility-administered encounter medications on file as of 01/09/2025.    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    Vasovagal syncope    Past Surgical History:  Procedure Laterality Date   ATRIAL FIBRILLATION ABLATION N/A 10/05/2024   Procedure: ATRIAL FIBRILLATION ABLATION;  Surgeon: Kennyth Chew, MD;  Location[facility] INVASIVE CV LAB;  Service: Cardiovascular;  Laterality: N/A;   BRAIN MENINGIOMA EXCISION  2012   CARDIOVERSION N/A 04/28/2024   Procedure: CARDIOVERSION;  Surgeon: Loni Soyla LABOR, MD;  Location: [facility] INVASIVE CV LAB;  Service: Cardiovascular;  Laterality: N/A;   CATARACT EXTRACTION W/ INTRAOCULAR LENS IMPLANT  ~ 2001   right   COLONOSCOPY  2008   EYE SURGERY  1964   right; hit w/baseball; eye hemorrhaged   FLEXIBLE SIGMOIDOSCOPY     REFRACTIVE SURGERY  ~ 2003   ROBOTIC ASSITED PARTIAL NEPHRECTOMY Right 02/29/2016   Procedure: XI ROBOTIC ASSITED PARTIAL NEPHRECTOMY;  Surgeon: Ricardo Likens, MD;  Location: [facility] ORS;  Service: Urology;  Laterality: Right;   NEEDS ULTRASOUND   Family History  Problem Relation Age of Onset   Hypertension Brother    Prostate cancer Brother    Prostate cancer Paternal Grandfather    Healthy Son    Depression Other    Hypertension Other    GI problems Other    Lung disease Other    Colon cancer Neg Hx    Esophageal cancer Neg Hx    Ulcerative colitis Neg Hx    Social History   Occupational History    Comment: [employer]   Tobacco Use   Smoking status: Never    Passive exposure: Past   Smokeless tobacco: Never   Tobacco comments:    Never smoked 11/01/24  Vaping Use   Vaping status: Never Used  Substance and Sexual Activity   Alcohol use: No   Drug  use: No   Sexual activity: Yes   Tobacco Counseling Counseling given: Not Answered Tobacco comments: Never smoked 11/01/24  SDOH Screenings   Food Insecurity: No Food Insecurity (01/09/2025)  Housing: Low Risk (01/09/2025)  Transportation Needs: No Transportation Needs (01/09/2025)  Recent Concern: Transportation Needs - Unmet Transportation Needs (11/07/2024)  Utilities: Not At Risk (01/09/2025)  Depression (PHQ2-9): Low Risk (01/09/2025)  Financial Resource Strain: Low Risk (11/07/2024)  Physical Activity: Inactive (01/09/2025)  Social Connections: Moderately Isolated (01/09/2025)  Stress: No Stress Concern Present (01/09/2025)  Tobacco Use: Low Risk (01/09/2025)  Health Literacy: Adequate Health Literacy (01/09/2025)   See flowsheets for full screening details  Depression Screen PHQ 2 & 9 Depression Scale- Over the past 2 weeks, how often have you been bothered by any of the following problems? Little interest or pleasure in doing things: 0 Feeling down, depressed, or hopeless (PHQ Adolescent also includes...irritable): 0 PHQ-2 Total Score: 0     Goals Addressed               This Visit's Progress     start walking (pt-stated)               Objective:    Today's Vitals   01/09/25 0827  Weight: 192 lb (87.1 kg)  Height: 6' (1.829 m)   Body mass index is 26.04 kg/m.  Hearing/Vision screen No results found. Immunizations and Health Maintenance Health Maintenance  Topic Date Due   Medicare Annual Wellness (AWV)  01/09/2026   Colonoscopy  04/16/2028   Pneumococcal Vaccine: 50+ Years  Completed   Influenza Vaccine  Completed   Hepatitis C Screening  Completed   Meningococcal B Vaccine  Aged Out   DTaP/Tdap/Td  Discontinued   COVID-19 Vaccine  Discontinued   Zoster Vaccines- Shingrix  Discontinued        Assessment/Plan:  This is a routine wellness examination for Max [name].  Patient Care Team: Kennyth Worth HERO, MD as PCP - General (Family  Medicine) Loni Soyla LABOR, MD as PCP - Cardiology (Cardiology) Kennyth Chew, MD as PCP - Electrophysiology (Cardiology) Leeann Hover, MD as Consulting Physician (Neurosurgery) Mannam, Praveen, MD as Consulting Physician (Pulmonary Disease) Lelon Lonni BIRCH, MD as Consulting Physician (Ophthalmology)  I have personally reviewed and noted the following in the patients chart:   Medical and social history Use of alcohol, tobacco or illicit drugs  Current medications and supplements including opioid prescriptions. Functional ability and status Nutritional status Physical activity Advanced directives List of other physicians Hospitalizations, surgeries, and ER visits in previous 12 months Vitals Screenings to include cognitive, depression, and falls Referrals and appointments  No orders of the defined types were placed in this encounter.  In addition, I have reviewed and discussed with patient certain preventive protocols, quality metrics, and best practice recommendations. A written personalized care plan for preventive services as well as general preventive health recommendations were provided to patient.   Max VEAR Haws, LPN   8/73/7973   Return in about 1 year (around 01/09/2026).  After Visit Summary: (MyChart) Due to this being a telephonic visit, the after visit summary with patients personalized plan was offered to patient via MyChart   Nurse Notes: Patient advised to keep follow-up appointment with PCP (02/23/25) "

## 2025-01-09 NOTE — Patient Instructions (Signed)
 Mr. Maday,  Thank you for taking the time for your Medicare Wellness Visit. I appreciate your continued commitment to your health goals. Please review the care plan we discussed, and feel free to reach out if I can assist you further.  Please note that Annual Wellness Visits do not include a physical exam. Some assessments may be limited, especially if the visit was conducted virtually. If needed, we may recommend an in-person follow-up with your provider.  Ongoing Care Seeing your primary care provider every 3 to 6 months helps us  monitor your health and provide consistent, personalized care.   Referrals If a referral was made during today's visit and you haven't received any updates within two weeks, please contact the referred provider directly to check on the status.  Recommended Screenings:  Health Maintenance  Topic Date Due   Medicare Annual Wellness Visit  01/09/2026   Colon Cancer Screening  04/16/2028   Pneumococcal Vaccine for age over 5  Completed   Flu Shot  Completed   Hepatitis C Screening  Completed   Meningitis B Vaccine  Aged Out   DTaP/Tdap/Td vaccine  Discontinued   COVID-19 Vaccine  Discontinued   Zoster (Shingles) Vaccine  Discontinued       10/05/2024    5:51 AM  Advanced Directives  Does Patient Have a Medical Advance Directive? Yes  Type of Advance Directive Healthcare Power of Attorney  Does patient want to make changes to medical advance directive? No - Patient declined    Vision: Annual vision screenings are recommended for early detection of glaucoma, cataracts, and diabetic retinopathy. These exams can also reveal signs of chronic conditions such as diabetes and high blood pressure.  Dental: Annual dental screenings help detect early signs of oral cancer, gum disease, and other conditions linked to overall health, including heart disease and diabetes.  Please see the attached documents for additional preventive care recommendations.

## 2025-01-11 NOTE — Progress Notes (Signed)
 [facility]                                          MRN: [id number]   01/11/2025   The [facility] Quality Team Specialist reviewed this patient medical record for the purposes of chart review for care gap closure. The following were reviewed: chart review for care gap closure-controlling blood pressure.    [facility] Quality Team

## 2025-01-13 ENCOUNTER — Other Ambulatory Visit: Payer: Self-pay | Admitting: Physician Assistant

## 2025-01-13 NOTE — Telephone Encounter (Signed)
 Last Fill: 12/23/2024  Labs: 12/26/2024 CBC and CMP are stable.   Next Visit: 01/19/2025  Last Visit: 08/18/2024  DX: Rheumatoid arthritis with rheumatoid factor of multiple sites without organ or systems involvement (HCC)   Current Dose per office note 08/18/2024: Methotrexate  6 tablets by mouth once weekly   Okay to refill Methotrexate ?

## 2025-01-19 ENCOUNTER — Ambulatory Visit: Admitting: Physician Assistant

## 2025-01-19 DIAGNOSIS — R911 Solitary pulmonary nodule: Secondary | ICD-10-CM

## 2025-01-19 DIAGNOSIS — M0579 Rheumatoid arthritis with rheumatoid factor of multiple sites without organ or systems involvement: Secondary | ICD-10-CM

## 2025-01-19 DIAGNOSIS — M05742 Rheumatoid arthritis with rheumatoid factor of left hand without organ or systems involvement: Secondary | ICD-10-CM

## 2025-01-19 DIAGNOSIS — Z8669 Personal history of other diseases of the nervous system and sense organs: Secondary | ICD-10-CM

## 2025-01-19 DIAGNOSIS — I10 Essential (primary) hypertension: Secondary | ICD-10-CM

## 2025-01-19 DIAGNOSIS — H18891 Other specified disorders of cornea, right eye: Secondary | ICD-10-CM

## 2025-01-19 DIAGNOSIS — I480 Paroxysmal atrial fibrillation: Secondary | ICD-10-CM

## 2025-01-19 DIAGNOSIS — Z86711 Personal history of pulmonary embolism: Secondary | ICD-10-CM

## 2025-01-19 DIAGNOSIS — E785 Hyperlipidemia, unspecified: Secondary | ICD-10-CM

## 2025-01-19 DIAGNOSIS — L570 Actinic keratosis: Secondary | ICD-10-CM

## 2025-01-19 DIAGNOSIS — M05772 Rheumatoid arthritis with rheumatoid factor of left ankle and foot without organ or systems involvement: Secondary | ICD-10-CM

## 2025-01-19 DIAGNOSIS — R7989 Other specified abnormal findings of blood chemistry: Secondary | ICD-10-CM

## 2025-01-19 DIAGNOSIS — Z79899 Other long term (current) drug therapy: Secondary | ICD-10-CM

## 2025-01-25 ENCOUNTER — Ambulatory Visit: Admitting: Student

## 2025-01-27 ENCOUNTER — Ambulatory Visit

## 2025-02-23 ENCOUNTER — Encounter: Admitting: Family Medicine

## 2026-01-10 ENCOUNTER — Ambulatory Visit
